# Patient Record
Sex: Male | Born: 1940 | Race: White | Hispanic: No | Smoking: Former smoker
Health system: Southern US, Community
[De-identification: ages and names within clinical notes are randomized; demographics above are authoritative.]

## PROBLEM LIST (undated history)

## (undated) DIAGNOSIS — M1612 Unilateral primary osteoarthritis, left hip: Secondary | ICD-10-CM

## (undated) DIAGNOSIS — F329 Major depressive disorder, single episode, unspecified: Secondary | ICD-10-CM

## (undated) DIAGNOSIS — G479 Sleep disorder, unspecified: Secondary | ICD-10-CM

## (undated) DIAGNOSIS — K219 Gastro-esophageal reflux disease without esophagitis: Secondary | ICD-10-CM

## (undated) DIAGNOSIS — K5790 Diverticulosis of intestine, part unspecified, without perforation or abscess without bleeding: Secondary | ICD-10-CM

## (undated) DIAGNOSIS — I2510 Atherosclerotic heart disease of native coronary artery without angina pectoris: Secondary | ICD-10-CM

## (undated) DIAGNOSIS — I10 Essential (primary) hypertension: Secondary | ICD-10-CM

## (undated) DIAGNOSIS — L989 Disorder of the skin and subcutaneous tissue, unspecified: Secondary | ICD-10-CM

## (undated) DIAGNOSIS — F32A Depression, unspecified: Secondary | ICD-10-CM

## (undated) DIAGNOSIS — M1990 Unspecified osteoarthritis, unspecified site: Secondary | ICD-10-CM

## (undated) DIAGNOSIS — E119 Type 2 diabetes mellitus without complications: Secondary | ICD-10-CM

## (undated) DIAGNOSIS — G4730 Sleep apnea, unspecified: Secondary | ICD-10-CM

## (undated) DIAGNOSIS — IMO0001 Reserved for inherently not codable concepts without codable children: Secondary | ICD-10-CM

## (undated) DIAGNOSIS — R3915 Urgency of urination: Secondary | ICD-10-CM

## (undated) DIAGNOSIS — I499 Cardiac arrhythmia, unspecified: Secondary | ICD-10-CM

## (undated) DIAGNOSIS — M1711 Unilateral primary osteoarthritis, right knee: Secondary | ICD-10-CM

## (undated) DIAGNOSIS — I82409 Acute embolism and thrombosis of unspecified deep veins of unspecified lower extremity: Secondary | ICD-10-CM

## (undated) HISTORY — PX: CORONARY ANGIOPLASTY: SHX604

## (undated) HISTORY — PX: TRANSURETHRAL RESECTION OF PROSTATE: SHX73

## (undated) HISTORY — PX: BACK SURGERY: SHX140

## (undated) HISTORY — DX: Reserved for inherently not codable concepts without codable children: IMO0001

## (undated) HISTORY — PX: JOINT REPLACEMENT: SHX530

## (undated) HISTORY — PX: PENILE PROSTHESIS IMPLANT: SHX240

## (undated) HISTORY — DX: Gastro-esophageal reflux disease without esophagitis: K21.9

## (undated) HISTORY — PX: FOOT SURGERY: SHX648

---

## 2002-04-24 HISTORY — PX: CORONARY ARTERY BYPASS GRAFT: SHX141

## 2010-01-19 ENCOUNTER — Other Ambulatory Visit: Payer: Self-pay

## 2010-06-11 ENCOUNTER — Inpatient Hospital Stay: Payer: Self-pay | Admitting: Specialist

## 2011-05-04 DIAGNOSIS — Z125 Encounter for screening for malignant neoplasm of prostate: Secondary | ICD-10-CM | POA: Diagnosis not present

## 2011-05-04 DIAGNOSIS — E119 Type 2 diabetes mellitus without complications: Secondary | ICD-10-CM | POA: Diagnosis not present

## 2011-05-04 DIAGNOSIS — E785 Hyperlipidemia, unspecified: Secondary | ICD-10-CM | POA: Diagnosis not present

## 2011-05-04 DIAGNOSIS — I10 Essential (primary) hypertension: Secondary | ICD-10-CM | POA: Diagnosis not present

## 2011-05-04 DIAGNOSIS — G4700 Insomnia, unspecified: Secondary | ICD-10-CM | POA: Diagnosis not present

## 2011-05-16 DIAGNOSIS — E785 Hyperlipidemia, unspecified: Secondary | ICD-10-CM | POA: Diagnosis not present

## 2011-05-16 DIAGNOSIS — I2510 Atherosclerotic heart disease of native coronary artery without angina pectoris: Secondary | ICD-10-CM | POA: Diagnosis not present

## 2011-05-16 DIAGNOSIS — I10 Essential (primary) hypertension: Secondary | ICD-10-CM | POA: Diagnosis not present

## 2011-05-16 DIAGNOSIS — E119 Type 2 diabetes mellitus without complications: Secondary | ICD-10-CM | POA: Diagnosis not present

## 2011-07-17 DIAGNOSIS — L821 Other seborrheic keratosis: Secondary | ICD-10-CM | POA: Diagnosis not present

## 2011-07-17 DIAGNOSIS — L259 Unspecified contact dermatitis, unspecified cause: Secondary | ICD-10-CM | POA: Diagnosis not present

## 2011-07-17 DIAGNOSIS — L570 Actinic keratosis: Secondary | ICD-10-CM | POA: Diagnosis not present

## 2011-07-20 DIAGNOSIS — L600 Ingrowing nail: Secondary | ICD-10-CM | POA: Diagnosis not present

## 2011-07-20 DIAGNOSIS — M898X9 Other specified disorders of bone, unspecified site: Secondary | ICD-10-CM | POA: Diagnosis not present

## 2011-07-31 DIAGNOSIS — L600 Ingrowing nail: Secondary | ICD-10-CM | POA: Diagnosis not present

## 2011-08-14 DIAGNOSIS — N401 Enlarged prostate with lower urinary tract symptoms: Secondary | ICD-10-CM | POA: Diagnosis not present

## 2011-08-14 DIAGNOSIS — R350 Frequency of micturition: Secondary | ICD-10-CM | POA: Diagnosis not present

## 2011-08-14 DIAGNOSIS — R351 Nocturia: Secondary | ICD-10-CM | POA: Diagnosis not present

## 2011-08-15 DIAGNOSIS — M898X9 Other specified disorders of bone, unspecified site: Secondary | ICD-10-CM | POA: Diagnosis not present

## 2011-09-19 DIAGNOSIS — B359 Dermatophytosis, unspecified: Secondary | ICD-10-CM | POA: Diagnosis not present

## 2011-09-19 DIAGNOSIS — L909 Atrophic disorder of skin, unspecified: Secondary | ICD-10-CM | POA: Diagnosis not present

## 2011-09-19 DIAGNOSIS — L919 Hypertrophic disorder of the skin, unspecified: Secondary | ICD-10-CM | POA: Diagnosis not present

## 2011-09-19 DIAGNOSIS — L821 Other seborrheic keratosis: Secondary | ICD-10-CM | POA: Diagnosis not present

## 2011-09-19 DIAGNOSIS — L259 Unspecified contact dermatitis, unspecified cause: Secondary | ICD-10-CM | POA: Diagnosis not present

## 2011-11-06 DIAGNOSIS — R350 Frequency of micturition: Secondary | ICD-10-CM | POA: Diagnosis not present

## 2011-11-06 DIAGNOSIS — R351 Nocturia: Secondary | ICD-10-CM | POA: Diagnosis not present

## 2011-11-06 DIAGNOSIS — R3915 Urgency of urination: Secondary | ICD-10-CM | POA: Diagnosis not present

## 2011-11-20 DIAGNOSIS — I119 Hypertensive heart disease without heart failure: Secondary | ICD-10-CM | POA: Diagnosis not present

## 2011-11-20 DIAGNOSIS — E119 Type 2 diabetes mellitus without complications: Secondary | ICD-10-CM | POA: Diagnosis not present

## 2011-11-20 DIAGNOSIS — E782 Mixed hyperlipidemia: Secondary | ICD-10-CM | POA: Diagnosis not present

## 2011-11-20 DIAGNOSIS — I2510 Atherosclerotic heart disease of native coronary artery without angina pectoris: Secondary | ICD-10-CM | POA: Diagnosis not present

## 2011-11-28 ENCOUNTER — Ambulatory Visit: Payer: Self-pay | Admitting: Urology

## 2011-11-28 DIAGNOSIS — R351 Nocturia: Secondary | ICD-10-CM | POA: Diagnosis not present

## 2011-11-28 DIAGNOSIS — N138 Other obstructive and reflux uropathy: Secondary | ICD-10-CM | POA: Diagnosis not present

## 2011-11-28 DIAGNOSIS — Z833 Family history of diabetes mellitus: Secondary | ICD-10-CM | POA: Diagnosis not present

## 2011-11-28 DIAGNOSIS — Z01812 Encounter for preprocedural laboratory examination: Secondary | ICD-10-CM | POA: Diagnosis not present

## 2011-11-28 DIAGNOSIS — Z7982 Long term (current) use of aspirin: Secondary | ICD-10-CM | POA: Diagnosis not present

## 2011-11-28 DIAGNOSIS — Z803 Family history of malignant neoplasm of breast: Secondary | ICD-10-CM | POA: Diagnosis not present

## 2011-11-28 DIAGNOSIS — Z01810 Encounter for preprocedural cardiovascular examination: Secondary | ICD-10-CM | POA: Diagnosis not present

## 2011-11-28 DIAGNOSIS — Z6831 Body mass index (BMI) 31.0-31.9, adult: Secondary | ICD-10-CM | POA: Diagnosis not present

## 2011-11-28 DIAGNOSIS — R350 Frequency of micturition: Secondary | ICD-10-CM | POA: Diagnosis not present

## 2011-11-28 DIAGNOSIS — N529 Male erectile dysfunction, unspecified: Secondary | ICD-10-CM | POA: Diagnosis not present

## 2011-11-28 DIAGNOSIS — I2510 Atherosclerotic heart disease of native coronary artery without angina pectoris: Secondary | ICD-10-CM | POA: Diagnosis not present

## 2011-11-28 DIAGNOSIS — Z79899 Other long term (current) drug therapy: Secondary | ICD-10-CM | POA: Diagnosis not present

## 2011-11-28 DIAGNOSIS — R339 Retention of urine, unspecified: Secondary | ICD-10-CM | POA: Diagnosis not present

## 2011-11-28 DIAGNOSIS — Z8249 Family history of ischemic heart disease and other diseases of the circulatory system: Secondary | ICD-10-CM | POA: Diagnosis not present

## 2011-11-28 LAB — CBC WITH DIFFERENTIAL/PLATELET
Eosinophil %: 2.6 %
Lymphocyte #: 2.6 10*3/uL (ref 1.0–3.6)
MCH: 32.7 pg (ref 26.0–34.0)
MCV: 94 fL (ref 80–100)
Monocyte #: 0.5 x10 3/mm (ref 0.2–1.0)
Platelet: 243 10*3/uL (ref 150–440)
RDW: 12.7 % (ref 11.5–14.5)

## 2011-11-28 LAB — BASIC METABOLIC PANEL
BUN: 27 mg/dL — ABNORMAL HIGH (ref 7–18)
Creatinine: 1.13 mg/dL (ref 0.60–1.30)
EGFR (African American): >60
Glucose: 132 mg/dL — ABNORMAL HIGH (ref 65–99)
Potassium: 4.1 mmol/L (ref 3.5–5.1)
Sodium: 139 mmol/L (ref 136–145)

## 2011-12-13 ENCOUNTER — Ambulatory Visit: Payer: Self-pay | Admitting: Urology

## 2011-12-13 DIAGNOSIS — Z803 Family history of malignant neoplasm of breast: Secondary | ICD-10-CM | POA: Diagnosis not present

## 2011-12-13 DIAGNOSIS — Z79899 Other long term (current) drug therapy: Secondary | ICD-10-CM | POA: Diagnosis not present

## 2011-12-13 DIAGNOSIS — E119 Type 2 diabetes mellitus without complications: Secondary | ICD-10-CM | POA: Diagnosis not present

## 2011-12-13 DIAGNOSIS — N318 Other neuromuscular dysfunction of bladder: Secondary | ICD-10-CM | POA: Diagnosis not present

## 2011-12-13 DIAGNOSIS — Z7982 Long term (current) use of aspirin: Secondary | ICD-10-CM | POA: Diagnosis not present

## 2011-12-13 DIAGNOSIS — Z87891 Personal history of nicotine dependence: Secondary | ICD-10-CM | POA: Diagnosis not present

## 2011-12-13 DIAGNOSIS — R350 Frequency of micturition: Secondary | ICD-10-CM | POA: Diagnosis not present

## 2011-12-13 DIAGNOSIS — R351 Nocturia: Secondary | ICD-10-CM | POA: Diagnosis not present

## 2011-12-13 DIAGNOSIS — Z833 Family history of diabetes mellitus: Secondary | ICD-10-CM | POA: Diagnosis not present

## 2011-12-13 DIAGNOSIS — Z951 Presence of aortocoronary bypass graft: Secondary | ICD-10-CM | POA: Diagnosis not present

## 2011-12-13 DIAGNOSIS — M4800 Spinal stenosis, site unspecified: Secondary | ICD-10-CM | POA: Diagnosis not present

## 2011-12-13 DIAGNOSIS — M069 Rheumatoid arthritis, unspecified: Secondary | ICD-10-CM | POA: Diagnosis not present

## 2011-12-13 DIAGNOSIS — N3941 Urge incontinence: Secondary | ICD-10-CM | POA: Diagnosis not present

## 2011-12-13 DIAGNOSIS — I2510 Atherosclerotic heart disease of native coronary artery without angina pectoris: Secondary | ICD-10-CM | POA: Diagnosis not present

## 2011-12-13 DIAGNOSIS — Z8249 Family history of ischemic heart disease and other diseases of the circulatory system: Secondary | ICD-10-CM | POA: Diagnosis not present

## 2011-12-13 DIAGNOSIS — R3915 Urgency of urination: Secondary | ICD-10-CM | POA: Diagnosis not present

## 2011-12-13 DIAGNOSIS — I10 Essential (primary) hypertension: Secondary | ICD-10-CM | POA: Diagnosis not present

## 2011-12-19 DIAGNOSIS — H43819 Vitreous degeneration, unspecified eye: Secondary | ICD-10-CM | POA: Diagnosis not present

## 2012-01-26 DIAGNOSIS — R350 Frequency of micturition: Secondary | ICD-10-CM | POA: Diagnosis not present

## 2012-01-26 DIAGNOSIS — R351 Nocturia: Secondary | ICD-10-CM | POA: Diagnosis not present

## 2012-01-26 DIAGNOSIS — R3915 Urgency of urination: Secondary | ICD-10-CM | POA: Diagnosis not present

## 2012-01-30 DIAGNOSIS — F411 Generalized anxiety disorder: Secondary | ICD-10-CM | POA: Diagnosis not present

## 2012-01-30 DIAGNOSIS — G4700 Insomnia, unspecified: Secondary | ICD-10-CM | POA: Diagnosis not present

## 2012-01-30 DIAGNOSIS — K649 Unspecified hemorrhoids: Secondary | ICD-10-CM | POA: Diagnosis not present

## 2012-02-02 DIAGNOSIS — E119 Type 2 diabetes mellitus without complications: Secondary | ICD-10-CM | POA: Diagnosis not present

## 2012-02-02 DIAGNOSIS — E785 Hyperlipidemia, unspecified: Secondary | ICD-10-CM | POA: Diagnosis not present

## 2012-02-02 DIAGNOSIS — I10 Essential (primary) hypertension: Secondary | ICD-10-CM | POA: Diagnosis not present

## 2012-02-02 DIAGNOSIS — K6289 Other specified diseases of anus and rectum: Secondary | ICD-10-CM | POA: Diagnosis not present

## 2012-02-14 DIAGNOSIS — Z23 Encounter for immunization: Secondary | ICD-10-CM | POA: Diagnosis not present

## 2012-03-01 DIAGNOSIS — K612 Anorectal abscess: Secondary | ICD-10-CM | POA: Diagnosis not present

## 2012-03-04 DIAGNOSIS — L980 Pyogenic granuloma: Secondary | ICD-10-CM | POA: Diagnosis not present

## 2012-03-04 DIAGNOSIS — D485 Neoplasm of uncertain behavior of skin: Secondary | ICD-10-CM | POA: Diagnosis not present

## 2012-03-04 DIAGNOSIS — L820 Inflamed seborrheic keratosis: Secondary | ICD-10-CM | POA: Diagnosis not present

## 2012-03-04 DIAGNOSIS — L821 Other seborrheic keratosis: Secondary | ICD-10-CM | POA: Diagnosis not present

## 2012-03-04 DIAGNOSIS — L723 Sebaceous cyst: Secondary | ICD-10-CM | POA: Diagnosis not present

## 2012-03-11 DIAGNOSIS — I10 Essential (primary) hypertension: Secondary | ICD-10-CM | POA: Diagnosis not present

## 2012-03-11 DIAGNOSIS — K625 Hemorrhage of anus and rectum: Secondary | ICD-10-CM | POA: Diagnosis not present

## 2012-03-11 DIAGNOSIS — F411 Generalized anxiety disorder: Secondary | ICD-10-CM | POA: Diagnosis not present

## 2012-03-11 DIAGNOSIS — G4700 Insomnia, unspecified: Secondary | ICD-10-CM | POA: Diagnosis not present

## 2012-03-13 DIAGNOSIS — M25569 Pain in unspecified knee: Secondary | ICD-10-CM | POA: Diagnosis not present

## 2012-04-03 DIAGNOSIS — I259 Chronic ischemic heart disease, unspecified: Secondary | ICD-10-CM | POA: Diagnosis not present

## 2012-04-03 DIAGNOSIS — I10 Essential (primary) hypertension: Secondary | ICD-10-CM | POA: Diagnosis not present

## 2012-04-03 DIAGNOSIS — F411 Generalized anxiety disorder: Secondary | ICD-10-CM | POA: Diagnosis not present

## 2012-04-03 DIAGNOSIS — G4700 Insomnia, unspecified: Secondary | ICD-10-CM | POA: Diagnosis not present

## 2012-04-10 DIAGNOSIS — M1710 Unilateral primary osteoarthritis, unspecified knee: Secondary | ICD-10-CM | POA: Diagnosis not present

## 2012-04-25 DIAGNOSIS — E119 Type 2 diabetes mellitus without complications: Secondary | ICD-10-CM | POA: Diagnosis not present

## 2012-04-25 DIAGNOSIS — J309 Allergic rhinitis, unspecified: Secondary | ICD-10-CM | POA: Diagnosis not present

## 2012-04-25 DIAGNOSIS — K219 Gastro-esophageal reflux disease without esophagitis: Secondary | ICD-10-CM | POA: Diagnosis not present

## 2012-04-25 DIAGNOSIS — I10 Essential (primary) hypertension: Secondary | ICD-10-CM | POA: Diagnosis not present

## 2012-04-25 DIAGNOSIS — E785 Hyperlipidemia, unspecified: Secondary | ICD-10-CM | POA: Diagnosis not present

## 2012-04-26 DIAGNOSIS — I059 Rheumatic mitral valve disease, unspecified: Secondary | ICD-10-CM | POA: Diagnosis not present

## 2012-04-26 DIAGNOSIS — I119 Hypertensive heart disease without heart failure: Secondary | ICD-10-CM | POA: Diagnosis not present

## 2012-04-26 DIAGNOSIS — I2510 Atherosclerotic heart disease of native coronary artery without angina pectoris: Secondary | ICD-10-CM | POA: Diagnosis not present

## 2012-04-26 DIAGNOSIS — E782 Mixed hyperlipidemia: Secondary | ICD-10-CM | POA: Diagnosis not present

## 2012-05-09 ENCOUNTER — Encounter (HOSPITAL_COMMUNITY): Payer: Self-pay | Admitting: Pharmacy Technician

## 2012-05-10 ENCOUNTER — Ambulatory Visit: Payer: Self-pay | Admitting: Gastroenterology

## 2012-05-10 DIAGNOSIS — I2510 Atherosclerotic heart disease of native coronary artery without angina pectoris: Secondary | ICD-10-CM | POA: Diagnosis not present

## 2012-05-10 DIAGNOSIS — K648 Other hemorrhoids: Secondary | ICD-10-CM | POA: Diagnosis not present

## 2012-05-10 DIAGNOSIS — F411 Generalized anxiety disorder: Secondary | ICD-10-CM | POA: Diagnosis not present

## 2012-05-10 DIAGNOSIS — K5730 Diverticulosis of large intestine without perforation or abscess without bleeding: Secondary | ICD-10-CM | POA: Diagnosis not present

## 2012-05-10 DIAGNOSIS — E669 Obesity, unspecified: Secondary | ICD-10-CM | POA: Diagnosis not present

## 2012-05-10 DIAGNOSIS — Z87891 Personal history of nicotine dependence: Secondary | ICD-10-CM | POA: Diagnosis not present

## 2012-05-10 DIAGNOSIS — Z860100 Personal history of colon polyps, unspecified: Secondary | ICD-10-CM | POA: Diagnosis not present

## 2012-05-10 DIAGNOSIS — K625 Hemorrhage of anus and rectum: Secondary | ICD-10-CM | POA: Diagnosis not present

## 2012-05-10 DIAGNOSIS — Z79899 Other long term (current) drug therapy: Secondary | ICD-10-CM | POA: Diagnosis not present

## 2012-05-10 DIAGNOSIS — K649 Unspecified hemorrhoids: Secondary | ICD-10-CM | POA: Diagnosis not present

## 2012-05-10 DIAGNOSIS — G4700 Insomnia, unspecified: Secondary | ICD-10-CM | POA: Diagnosis not present

## 2012-05-10 DIAGNOSIS — Z6835 Body mass index (BMI) 35.0-35.9, adult: Secondary | ICD-10-CM | POA: Diagnosis not present

## 2012-05-10 DIAGNOSIS — E785 Hyperlipidemia, unspecified: Secondary | ICD-10-CM | POA: Diagnosis not present

## 2012-05-10 DIAGNOSIS — Z951 Presence of aortocoronary bypass graft: Secondary | ICD-10-CM | POA: Diagnosis not present

## 2012-05-10 DIAGNOSIS — I10 Essential (primary) hypertension: Secondary | ICD-10-CM | POA: Diagnosis not present

## 2012-05-10 DIAGNOSIS — Z8052 Family history of malignant neoplasm of bladder: Secondary | ICD-10-CM | POA: Diagnosis not present

## 2012-05-10 DIAGNOSIS — K2100 Gastro-esophageal reflux disease with esophagitis, without bleeding: Secondary | ICD-10-CM | POA: Diagnosis not present

## 2012-05-10 DIAGNOSIS — E119 Type 2 diabetes mellitus without complications: Secondary | ICD-10-CM | POA: Diagnosis not present

## 2012-05-13 NOTE — Patient Instructions (Addendum)
UZAIR GODLEY  05/13/2012                           YOUR PROCEDURE IS SCHEDULED ON:  05/21/12               PLEASE REPORT TO SHORT STAY CENTER AT :  5:15 AM               CALL THIS NUMBER IF ANY PROBLEMS THE DAY OF SURGERY :               [phone]                      REMEMBER:   Do not eat food or drink liquids AFTER MIDNIGHT   Take these medicines the morning of surgery with A SIP OF WATER:  WELLBUTRIN Orbie Hurst / VYTORIN / PANTOPRAZOLE / MAY TAKE CLONAZAPAM IF NEEDED / MAY USE FLONASE NASAL SPRAY   Do not wear jewelry, make-up   Do not wear lotions, powders, or perfumes.   Do not shave legs or underarms 12 hrs. before surgery (men may shave face)  Do not bring valuables to the hospital.  Contacts, dentures or bridgework may not be worn into surgery.  Leave suitcase in the car. After surgery it may be brought to your room.  For patients admitted to the hospital more than one night, checkout time is 11:00                          The day of discharge.   Patients discharged the day of surgery will not be allowed to drive home                             If going home same day of surgery, must have someone stay with you first                           24 hrs at home and arrange for some one to drive you home from hospital.    Special Instructions:   Please read over the following fact sheets that you were given:               1. MRSA  INFORMATION                      2. [place] PREPARING FOR SURGERY SHEET                                                X_____________________________________________________________________        Failure to follow these instructions may result in cancellation of your surgery

## 2012-05-14 ENCOUNTER — Encounter (HOSPITAL_COMMUNITY)
Admission: RE | Admit: 2012-05-14 | Discharge: 2012-05-14 | Disposition: A | Discharge status: Home / Self Care | Payer: Medicare Other | Source: Ambulatory Visit | Attending: Orthopedic Surgery | Admitting: Orthopedic Surgery

## 2012-05-14 ENCOUNTER — Other Ambulatory Visit: Payer: Self-pay | Admitting: Orthopedic Surgery

## 2012-05-14 ENCOUNTER — Encounter (HOSPITAL_COMMUNITY): Payer: Self-pay

## 2012-05-14 ENCOUNTER — Ambulatory Visit (HOSPITAL_COMMUNITY)
Admission: RE | Admit: 2012-05-14 | Discharge: 2012-05-14 | Disposition: A | Discharge status: Home / Self Care | Payer: Medicare Other | Source: Ambulatory Visit | Attending: Orthopedic Surgery | Admitting: Orthopedic Surgery

## 2012-05-14 DIAGNOSIS — Z951 Presence of aortocoronary bypass graft: Secondary | ICD-10-CM | POA: Insufficient documentation

## 2012-05-14 DIAGNOSIS — Z01818 Encounter for other preprocedural examination: Secondary | ICD-10-CM | POA: Insufficient documentation

## 2012-05-14 DIAGNOSIS — Z01812 Encounter for preprocedural laboratory examination: Secondary | ICD-10-CM | POA: Insufficient documentation

## 2012-05-14 HISTORY — DX: Essential (primary) hypertension: I10

## 2012-05-14 HISTORY — DX: Sleep disorder, unspecified: G47.9

## 2012-05-14 HISTORY — DX: Unspecified osteoarthritis, unspecified site: M19.90

## 2012-05-14 HISTORY — DX: Major depressive disorder, single episode, unspecified: F32.9

## 2012-05-14 HISTORY — DX: Depression, unspecified: F32.A

## 2012-05-14 HISTORY — DX: Atherosclerotic heart disease of native coronary artery without angina pectoris: I25.10

## 2012-05-14 HISTORY — DX: Urgency of urination: R39.15

## 2012-05-14 HISTORY — DX: Gastro-esophageal reflux disease without esophagitis: K21.9

## 2012-05-14 HISTORY — DX: Type 2 diabetes mellitus without complications: E11.9

## 2012-05-14 LAB — BASIC METABOLIC PANEL
CO2: 25 mEq/L (ref 19–32)
Calcium: 9.3 mg/dL (ref 8.4–10.5)
Chloride: 102 mEq/L (ref 96–112)
Potassium: 4.2 mEq/L (ref 3.5–5.1)
Sodium: 138 mEq/L (ref 135–145)

## 2012-05-14 LAB — URINALYSIS, ROUTINE W REFLEX MICROSCOPIC
Hgb urine dipstick: NEGATIVE
Protein, ur: NEGATIVE mg/dL
Urobilinogen, UA: 1 mg/dL (ref 0.0–1.0)

## 2012-05-14 LAB — CBC
HCT: 45.3 % (ref 39.0–52.0)
Hemoglobin: 15.8 g/dL (ref 13.0–17.0)
MCV: 92.3 fL (ref 78.0–100.0)
Platelets: 292 10*3/uL (ref 150–400)
RBC: 4.91 MIL/uL (ref 4.22–5.81)
WBC: 7.8 10*3/uL (ref 4.0–10.5)

## 2012-05-14 LAB — ABO/RH: ABO/RH(D): O POS

## 2012-05-14 LAB — SURGICAL PCR SCREEN: Staphylococcus aureus: POSITIVE — AB

## 2012-05-14 NOTE — Progress Notes (Signed)
05/14/12 1041  OBSTRUCTIVE SLEEP APNEA  Have you ever been diagnosed with sleep apnea through a sleep study? No  Do you snore loudly (loud enough to be heard through closed doors)?  1  Do you often feel tired, fatigued, or sleepy during the daytime? 0  Has anyone observed you stop breathing during your sleep? 0  Do you have, or are you being treated for high blood pressure? 1  BMI more than 35 kg/m2? 0  Age over 71 years old? 1  Neck circumference greater than 40 cm/18 inches? 1  Gender: 1  Obstructive Sleep Apnea Score 5   Score 4 or greater  Results sent to PCP

## 2012-05-21 ENCOUNTER — Inpatient Hospital Stay (HOSPITAL_COMMUNITY): Payer: Medicare Other | Admitting: Anesthesiology

## 2012-05-21 ENCOUNTER — Encounter (HOSPITAL_COMMUNITY): Payer: Self-pay | Admitting: *Deleted

## 2012-05-21 ENCOUNTER — Encounter (HOSPITAL_COMMUNITY): Payer: Self-pay | Admitting: Orthopedic Surgery

## 2012-05-21 ENCOUNTER — Inpatient Hospital Stay (HOSPITAL_COMMUNITY)
Admission: RE | Admit: 2012-05-21 | Discharge: 2012-05-24 | DRG: 470 | Disposition: A | Discharge status: Skilled Nursing Facility | Payer: Medicare Other | Source: Ambulatory Visit | Attending: Orthopedic Surgery | Admitting: Orthopedic Surgery

## 2012-05-21 ENCOUNTER — Encounter (HOSPITAL_COMMUNITY)
Admission: RE | Disposition: A | Discharge status: Skilled Nursing Facility | Payer: Self-pay | Source: Ambulatory Visit | Attending: Orthopedic Surgery

## 2012-05-21 ENCOUNTER — Inpatient Hospital Stay (HOSPITAL_COMMUNITY): Payer: Medicare Other

## 2012-05-21 ENCOUNTER — Encounter (HOSPITAL_COMMUNITY): Payer: Self-pay | Admitting: Anesthesiology

## 2012-05-21 DIAGNOSIS — M6281 Muscle weakness (generalized): Secondary | ICD-10-CM | POA: Diagnosis not present

## 2012-05-21 DIAGNOSIS — K219 Gastro-esophageal reflux disease without esophagitis: Secondary | ICD-10-CM | POA: Diagnosis present

## 2012-05-21 DIAGNOSIS — R262 Difficulty in walking, not elsewhere classified: Secondary | ICD-10-CM | POA: Diagnosis not present

## 2012-05-21 DIAGNOSIS — M1710 Unilateral primary osteoarthritis, unspecified knee: Secondary | ICD-10-CM | POA: Diagnosis not present

## 2012-05-21 DIAGNOSIS — E119 Type 2 diabetes mellitus without complications: Secondary | ICD-10-CM | POA: Diagnosis present

## 2012-05-21 DIAGNOSIS — F411 Generalized anxiety disorder: Secondary | ICD-10-CM | POA: Diagnosis not present

## 2012-05-21 DIAGNOSIS — F3289 Other specified depressive episodes: Secondary | ICD-10-CM | POA: Diagnosis present

## 2012-05-21 DIAGNOSIS — IMO0002 Reserved for concepts with insufficient information to code with codable children: Secondary | ICD-10-CM | POA: Diagnosis not present

## 2012-05-21 DIAGNOSIS — G8918 Other acute postprocedural pain: Secondary | ICD-10-CM | POA: Diagnosis not present

## 2012-05-21 DIAGNOSIS — Z471 Aftercare following joint replacement surgery: Secondary | ICD-10-CM | POA: Diagnosis not present

## 2012-05-21 DIAGNOSIS — F329 Major depressive disorder, single episode, unspecified: Secondary | ICD-10-CM | POA: Diagnosis present

## 2012-05-21 DIAGNOSIS — E785 Hyperlipidemia, unspecified: Secondary | ICD-10-CM | POA: Diagnosis not present

## 2012-05-21 DIAGNOSIS — Z96659 Presence of unspecified artificial knee joint: Secondary | ICD-10-CM | POA: Diagnosis not present

## 2012-05-21 DIAGNOSIS — E669 Obesity, unspecified: Secondary | ICD-10-CM | POA: Diagnosis present

## 2012-05-21 DIAGNOSIS — M25569 Pain in unspecified knee: Secondary | ICD-10-CM | POA: Diagnosis not present

## 2012-05-21 DIAGNOSIS — I2510 Atherosclerotic heart disease of native coronary artery without angina pectoris: Secondary | ICD-10-CM | POA: Diagnosis not present

## 2012-05-21 DIAGNOSIS — M1711 Unilateral primary osteoarthritis, right knee: Secondary | ICD-10-CM

## 2012-05-21 DIAGNOSIS — Z951 Presence of aortocoronary bypass graft: Secondary | ICD-10-CM | POA: Diagnosis not present

## 2012-05-21 DIAGNOSIS — E559 Vitamin D deficiency, unspecified: Secondary | ICD-10-CM | POA: Diagnosis not present

## 2012-05-21 DIAGNOSIS — I10 Essential (primary) hypertension: Secondary | ICD-10-CM | POA: Diagnosis not present

## 2012-05-21 DIAGNOSIS — S8990XA Unspecified injury of unspecified lower leg, initial encounter: Secondary | ICD-10-CM | POA: Diagnosis not present

## 2012-05-21 DIAGNOSIS — M1990 Unspecified osteoarthritis, unspecified site: Secondary | ICD-10-CM | POA: Diagnosis not present

## 2012-05-21 HISTORY — DX: Unilateral primary osteoarthritis, right knee: M17.11

## 2012-05-21 HISTORY — PX: TOTAL KNEE ARTHROPLASTY: SHX125

## 2012-05-21 LAB — CBC
Platelets: 259 10*3/uL (ref 150–400)
RBC: 4.47 MIL/uL (ref 4.22–5.81)
WBC: 10.3 10*3/uL (ref 4.0–10.5)

## 2012-05-21 LAB — GLUCOSE, CAPILLARY

## 2012-05-21 LAB — TYPE AND SCREEN
ABO/RH(D): O POS
Antibody Screen: NEGATIVE

## 2012-05-21 LAB — CREATININE, SERUM
Creatinine, Ser: 0.96 mg/dL (ref 0.50–1.35)
GFR calc Af Amer: >90 mL/min (ref >90)
GFR calc non Af Amer: 82 mL/min — ABNORMAL LOW (ref >90)

## 2012-05-21 SURGERY — ARTHROPLASTY, KNEE, TOTAL
Anesthesia: General | Site: Knee | Laterality: Right | Wound class: Clean

## 2012-05-21 MED ORDER — IRBESARTAN 150 MG PO TABS
150.0000 mg | ORAL_TABLET | Freq: Every day | ORAL | Status: DC
  Administered 2012-05-22 – 2012-05-24 (×3): 150 mg via ORAL
  Filled 2012-05-21 (×4): qty 1

## 2012-05-21 MED ORDER — POLYETHYLENE GLYCOL 3350 17 G PO PACK
17.0000 g | PACK | Freq: Every day | ORAL | Status: DC | PRN
  Administered 2012-05-22: 17 g via ORAL

## 2012-05-21 MED ORDER — SENNA 8.6 MG PO TABS
1.0000 | ORAL_TABLET | Freq: Two times a day (BID) | ORAL | Status: DC
  Administered 2012-05-21 – 2012-05-23 (×4): 8.6 mg via ORAL
  Filled 2012-05-21 (×5): qty 1

## 2012-05-21 MED ORDER — MEPERIDINE HCL 50 MG/ML IJ SOLN: 6.2500 mg | INTRAMUSCULAR | Status: DC | PRN

## 2012-05-21 MED ORDER — HYDROMORPHONE HCL PF 1 MG/ML IJ SOLN
INTRAMUSCULAR | Status: AC
  Filled 2012-05-21: qty 1

## 2012-05-21 MED ORDER — ACETAMINOPHEN 10 MG/ML IV SOLN
1000.0000 mg | Freq: Four times a day (QID) | INTRAVENOUS | Status: AC
  Administered 2012-05-21 – 2012-05-22 (×4): 1000 mg via INTRAVENOUS
  Filled 2012-05-21 (×5): qty 100

## 2012-05-21 MED ORDER — HYDROMORPHONE HCL PF 1 MG/ML IJ SOLN
0.5000 mg | INTRAMUSCULAR | Status: DC | PRN
  Administered 2012-05-21 – 2012-05-22 (×3): 0.5 mg via INTRAVENOUS
  Filled 2012-05-21 (×2): qty 1

## 2012-05-21 MED ORDER — LACTATED RINGERS IV SOLN
INTRAVENOUS | Status: DC | PRN
  Administered 2012-05-21 (×2): via INTRAVENOUS

## 2012-05-21 MED ORDER — LACTATED RINGERS IV SOLN
INTRAVENOUS | Status: DC
  Administered 2012-05-21: 1000 mL via INTRAVENOUS

## 2012-05-21 MED ORDER — ONDANSETRON HCL 4 MG PO TABS: 4.0000 mg | ORAL_TABLET | Freq: Four times a day (QID) | ORAL | Status: DC | PRN

## 2012-05-21 MED ORDER — ALUM & MAG HYDROXIDE-SIMETH 200-200-20 MG/5ML PO SUSP
30.0000 mL | ORAL | Status: DC | PRN
  Administered 2012-05-23: 30 mL via ORAL
  Filled 2012-05-21: qty 30

## 2012-05-21 MED ORDER — ZOLPIDEM TARTRATE 5 MG PO TABS: 5.0000 mg | ORAL_TABLET | Freq: Every evening | ORAL | Status: DC | PRN

## 2012-05-21 MED ORDER — MENTHOL 3 MG MT LOZG: 1.0000 | LOZENGE | OROMUCOSAL | Status: DC | PRN

## 2012-05-21 MED ORDER — DEXTROSE 5 % IV SOLN
3.0000 g | Freq: Four times a day (QID) | INTRAVENOUS | Status: AC
  Administered 2012-05-21 (×2): 3 g via INTRAVENOUS
  Filled 2012-05-21 (×3): qty 3000

## 2012-05-21 MED ORDER — EPHEDRINE SULFATE 50 MG/ML IJ SOLN
INTRAMUSCULAR | Status: DC | PRN
  Administered 2012-05-21 (×2): 5 mg via INTRAVENOUS

## 2012-05-21 MED ORDER — ASPIRIN EC 81 MG PO TBEC
81.0000 mg | DELAYED_RELEASE_TABLET | Freq: Every day | ORAL | Status: DC
  Administered 2012-05-21 – 2012-05-24 (×4): 81 mg via ORAL
  Filled 2012-05-21 (×4): qty 1

## 2012-05-21 MED ORDER — OXYCODONE HCL 5 MG PO TABS: 5.0000 mg | ORAL_TABLET | Freq: Once | ORAL | Status: DC | PRN

## 2012-05-21 MED ORDER — VITAMIN D3 25 MCG (1000 UNIT) PO TABS
2000.0000 [IU] | ORAL_TABLET | Freq: Every day | ORAL | Status: DC
  Administered 2012-05-21 – 2012-05-24 (×4): 2000 [IU] via ORAL
  Filled 2012-05-21 (×4): qty 2

## 2012-05-21 MED ORDER — CO Q 10 100 MG PO CAPS: 1.0000 | ORAL_CAPSULE | Freq: Every day | ORAL | Status: DC

## 2012-05-21 MED ORDER — POTASSIUM CHLORIDE IN NACL 20-0.45 MEQ/L-% IV SOLN
INTRAVENOUS | Status: DC
  Administered 2012-05-21 – 2012-05-23 (×4): via INTRAVENOUS
  Filled 2012-05-21 (×7): qty 1000

## 2012-05-21 MED ORDER — ROPIVACAINE HCL 5 MG/ML IJ SOLN
INTRAMUSCULAR | Status: DC | PRN
  Administered 2012-05-21: 30 mL

## 2012-05-21 MED ORDER — DEXTROSE 5 % IV SOLN
3.0000 g | INTRAVENOUS | Status: AC
  Administered 2012-05-21: 3 g via INTRAVENOUS

## 2012-05-21 MED ORDER — EZETIMIBE-SIMVASTATIN 10-20 MG PO TABS
1.0000 | ORAL_TABLET | Freq: Every day | ORAL | Status: DC
  Administered 2012-05-22 – 2012-05-24 (×3): 1 via ORAL
  Filled 2012-05-21 (×4): qty 1

## 2012-05-21 MED ORDER — ONDANSETRON HCL 4 MG/2ML IJ SOLN
INTRAMUSCULAR | Status: DC | PRN
  Administered 2012-05-21: 4 mg via INTRAVENOUS

## 2012-05-21 MED ORDER — CLONAZEPAM 0.5 MG PO TABS
0.5000 mg | ORAL_TABLET | Freq: Two times a day (BID) | ORAL | Status: DC | PRN
  Administered 2012-05-21 – 2012-05-23 (×2): 0.5 mg via ORAL
  Filled 2012-05-21 (×2): qty 1

## 2012-05-21 MED ORDER — PANTOPRAZOLE SODIUM 40 MG PO TBEC
40.0000 mg | DELAYED_RELEASE_TABLET | Freq: Every day | ORAL | Status: DC
  Administered 2012-05-22 – 2012-05-24 (×3): 40 mg via ORAL
  Filled 2012-05-21 (×3): qty 1

## 2012-05-21 MED ORDER — ONDANSETRON HCL 4 MG/2ML IJ SOLN: 4.0000 mg | Freq: Four times a day (QID) | INTRAMUSCULAR | Status: DC | PRN

## 2012-05-21 MED ORDER — BUSPIRONE HCL 15 MG PO TABS
15.0000 mg | ORAL_TABLET | Freq: Every day | ORAL | Status: DC
  Administered 2012-05-22 – 2012-05-24 (×3): 15 mg via ORAL
  Filled 2012-05-21 (×4): qty 1

## 2012-05-21 MED ORDER — METHOCARBAMOL 100 MG/ML IJ SOLN
500.0000 mg | Freq: Four times a day (QID) | INTRAVENOUS | Status: DC | PRN
  Administered 2012-05-21: 500 mg via INTRAVENOUS
  Filled 2012-05-21: qty 5

## 2012-05-21 MED ORDER — OXYCODONE HCL 5 MG PO TABS
5.0000 mg | ORAL_TABLET | ORAL | Status: DC | PRN
  Administered 2012-05-21 – 2012-05-23 (×15): 10 mg via ORAL
  Filled 2012-05-21 (×15): qty 2

## 2012-05-21 MED ORDER — WARFARIN SODIUM 5 MG PO TABS: 5.0000 mg | ORAL_TABLET | Freq: Every day | ORAL | Status: DC

## 2012-05-21 MED ORDER — METOCLOPRAMIDE HCL 10 MG PO TABS: 5.0000 mg | ORAL_TABLET | Freq: Three times a day (TID) | ORAL | Status: DC | PRN

## 2012-05-21 MED ORDER — PROMETHAZINE HCL 25 MG/ML IJ SOLN: 6.2500 mg | INTRAMUSCULAR | Status: DC | PRN

## 2012-05-21 MED ORDER — DOCUSATE SODIUM 100 MG PO CAPS
100.0000 mg | ORAL_CAPSULE | Freq: Two times a day (BID) | ORAL | Status: DC
  Administered 2012-05-21 – 2012-05-23 (×4): 100 mg via ORAL

## 2012-05-21 MED ORDER — BUPROPION HCL ER (XL) 300 MG PO TB24
300.0000 mg | ORAL_TABLET | Freq: Every day | ORAL | Status: DC
  Administered 2012-05-22 – 2012-05-24 (×3): 300 mg via ORAL
  Filled 2012-05-21 (×4): qty 1

## 2012-05-21 MED ORDER — MAGNESIUM CITRATE PO SOLN: 1.0000 | Freq: Once | ORAL | Status: AC | PRN

## 2012-05-21 MED ORDER — SORBITOL 70 % SOLN
30.0000 mL | Freq: Every day | Status: DC | PRN
  Administered 2012-05-22: 30 mL via ORAL
  Filled 2012-05-21: qty 30

## 2012-05-21 MED ORDER — MIDAZOLAM HCL 5 MG/5ML IJ SOLN
INTRAMUSCULAR | Status: DC | PRN
  Administered 2012-05-21: 2 mg via INTRAVENOUS

## 2012-05-21 MED ORDER — DIPHENHYDRAMINE HCL 12.5 MG/5ML PO ELIX: 12.5000 mg | ORAL_SOLUTION | ORAL | Status: DC | PRN

## 2012-05-21 MED ORDER — WARFARIN SODIUM 7.5 MG PO TABS
7.5000 mg | ORAL_TABLET | Freq: Once | ORAL | Status: AC
  Administered 2012-05-21: 7.5 mg via ORAL
  Filled 2012-05-21: qty 1

## 2012-05-21 MED ORDER — PHENOL 1.4 % MT LIQD: 1.0000 | OROMUCOSAL | Status: DC | PRN

## 2012-05-21 MED ORDER — ADULT MULTIVITAMIN W/MINERALS CH
1.0000 | ORAL_TABLET | Freq: Every day | ORAL | Status: DC
  Administered 2012-05-21 – 2012-05-24 (×4): 1 via ORAL
  Filled 2012-05-21 (×6): qty 1

## 2012-05-21 MED ORDER — ACETAMINOPHEN 650 MG RE SUPP: 650.0000 mg | Freq: Four times a day (QID) | RECTAL | Status: DC | PRN

## 2012-05-21 MED ORDER — LIDOCAINE HCL (CARDIAC) 20 MG/ML IV SOLN
INTRAVENOUS | Status: DC | PRN
  Administered 2012-05-21: 60 mg via INTRAVENOUS

## 2012-05-21 MED ORDER — FLUTICASONE PROPIONATE 50 MCG/ACT NA SUSP
2.0000 | Freq: Every day | NASAL | Status: DC
  Administered 2012-05-22 – 2012-05-24 (×3): 2 via NASAL
  Filled 2012-05-21: qty 16

## 2012-05-21 MED ORDER — OXYCODONE-ACETAMINOPHEN 10-325 MG PO TABS: 1.0000 | ORAL_TABLET | Freq: Four times a day (QID) | ORAL | Status: DC | PRN

## 2012-05-21 MED ORDER — OXYCODONE HCL 5 MG/5ML PO SOLN
5.0000 mg | Freq: Once | ORAL | Status: DC | PRN
  Filled 2012-05-21: qty 5

## 2012-05-21 MED ORDER — KETOROLAC TROMETHAMINE 15 MG/ML IJ SOLN
7.5000 mg | Freq: Four times a day (QID) | INTRAMUSCULAR | Status: AC
  Administered 2012-05-21 – 2012-05-22 (×4): 7.5 mg via INTRAVENOUS
  Filled 2012-05-21 (×4): qty 1

## 2012-05-21 MED ORDER — FENTANYL CITRATE 0.05 MG/ML IJ SOLN
INTRAMUSCULAR | Status: DC | PRN
  Administered 2012-05-21 (×7): 50 ug via INTRAVENOUS

## 2012-05-21 MED ORDER — PATIENT'S GUIDE TO USING COUMADIN BOOK
Freq: Once | Status: AC
  Administered 2012-05-21: 17:00:00
  Filled 2012-05-21: qty 1

## 2012-05-21 MED ORDER — ACETAMINOPHEN 10 MG/ML IV SOLN
INTRAVENOUS | Status: DC | PRN
  Administered 2012-05-21: 1000 mg via INTRAVENOUS

## 2012-05-21 MED ORDER — PROPOFOL 10 MG/ML IV BOLUS
INTRAVENOUS | Status: DC | PRN
  Administered 2012-05-21: 200 mg via INTRAVENOUS

## 2012-05-21 MED ORDER — WARFARIN - PHARMACIST DOSING INPATIENT: Freq: Every day | Status: DC

## 2012-05-21 MED ORDER — METHOCARBAMOL 500 MG PO TABS
500.0000 mg | ORAL_TABLET | Freq: Four times a day (QID) | ORAL | Status: DC | PRN
  Administered 2012-05-21 – 2012-05-24 (×6): 500 mg via ORAL
  Filled 2012-05-21 (×6): qty 1

## 2012-05-21 MED ORDER — WARFARIN VIDEO: Freq: Once | Status: DC

## 2012-05-21 MED ORDER — ACETAMINOPHEN 325 MG PO TABS: 650.0000 mg | ORAL_TABLET | Freq: Four times a day (QID) | ORAL | Status: DC | PRN

## 2012-05-21 MED ORDER — METOCLOPRAMIDE HCL 5 MG/ML IJ SOLN: 5.0000 mg | Freq: Three times a day (TID) | INTRAMUSCULAR | Status: DC | PRN

## 2012-05-21 MED ORDER — HYDROMORPHONE HCL PF 1 MG/ML IJ SOLN
INTRAMUSCULAR | Status: DC | PRN
  Administered 2012-05-21 (×4): 0.5 mg via INTRAVENOUS

## 2012-05-21 MED ORDER — ENOXAPARIN SODIUM 30 MG/0.3ML ~~LOC~~ SOLN
30.0000 mg | Freq: Two times a day (BID) | SUBCUTANEOUS | Status: DC
  Administered 2012-05-22 – 2012-05-24 (×5): 30 mg via SUBCUTANEOUS
  Filled 2012-05-21 (×7): qty 0.3

## 2012-05-21 MED ORDER — INSULIN ASPART 100 UNIT/ML ~~LOC~~ SOLN
0.0000 [IU] | Freq: Three times a day (TID) | SUBCUTANEOUS | Status: DC
  Administered 2012-05-21 – 2012-05-22 (×2): 2 [IU] via SUBCUTANEOUS
  Administered 2012-05-22: 3 [IU] via SUBCUTANEOUS
  Administered 2012-05-22 – 2012-05-23 (×2): 2 [IU] via SUBCUTANEOUS
  Administered 2012-05-23: 3 [IU] via SUBCUTANEOUS
  Administered 2012-05-23 – 2012-05-24 (×2): 2 [IU] via SUBCUTANEOUS

## 2012-05-21 MED ORDER — HYDROMORPHONE HCL PF 1 MG/ML IJ SOLN
0.2500 mg | INTRAMUSCULAR | Status: DC | PRN
  Administered 2012-05-21 (×4): 0.5 mg via INTRAVENOUS

## 2012-05-21 MED ORDER — SENNA-DOCUSATE SODIUM 8.6-50 MG PO TABS: 1.0000 | ORAL_TABLET | Freq: Every day | ORAL | Status: DC

## 2012-05-21 MED ORDER — SODIUM CHLORIDE 0.9 % IR SOLN
Status: DC | PRN
  Administered 2012-05-21: 1000 mL
  Administered 2012-05-21: 3000 mL

## 2012-05-21 MED ORDER — ACETAMINOPHEN 10 MG/ML IV SOLN: 1000.0000 mg | Freq: Once | INTRAVENOUS | Status: DC | PRN

## 2012-05-21 SURGICAL SUPPLY — 65 items
BAG ZIPLOCK 12X15 (MISCELLANEOUS) ×2 IMPLANT
BANDAGE ESMARK 6X9 LF (GAUZE/BANDAGES/DRESSINGS) ×1 IMPLANT
BENZOIN TINCTURE PRP APPL 2/3 (GAUZE/BANDAGES/DRESSINGS) ×2 IMPLANT
BLADE SAG 18X100X1.27 (BLADE) ×2 IMPLANT
BLADE SAW RECIPROCATING 77.5 (BLADE) ×2 IMPLANT
BLADE SAW SGTL 13.0X1.19X90.0M (BLADE) ×2 IMPLANT
BNDG ELASTIC 6X15 VLCR STRL LF (GAUZE/BANDAGES/DRESSINGS) ×2 IMPLANT
BNDG ESMARK 6X9 LF (GAUZE/BANDAGES/DRESSINGS) ×2
BOWL SMART MIX CTS (DISPOSABLE) ×2 IMPLANT
CEMENT HV SMART SET (Cement) ×4 IMPLANT
CLEANER TIP ELECTROSURG 2X2 (MISCELLANEOUS) IMPLANT
CLOTH BEACON ORANGE TIMEOUT ST (SAFETY) ×2 IMPLANT
CLSR STERI-STRIP ANTIMIC 1/2X4 (GAUZE/BANDAGES/DRESSINGS) ×2 IMPLANT
CUFF TOURN SGL QUICK 34 (TOURNIQUET CUFF) ×1
CUFF TRNQT CYL 34X4X40X1 (TOURNIQUET CUFF) ×1 IMPLANT
DRAPE EXTREMITY T 121X128X90 (DRAPE) ×2 IMPLANT
DRAPE POUCH INSTRU U-SHP 10X18 (DRAPES) ×2 IMPLANT
DRAPE U-SHAPE 47X51 STRL (DRAPES) ×2 IMPLANT
DRSG ADAPTIC 3X8 NADH LF (GAUZE/BANDAGES/DRESSINGS) IMPLANT
DRSG PAD ABDOMINAL 8X10 ST (GAUZE/BANDAGES/DRESSINGS) ×2 IMPLANT
ELECT REM PT RETURN 9FT ADLT (ELECTROSURGICAL) ×2
ELECTRODE REM PT RTRN 9FT ADLT (ELECTROSURGICAL) ×1 IMPLANT
EVACUATOR 1/8 PVC DRAIN (DRAIN) IMPLANT
FACESHIELD LNG OPTICON STERILE (SAFETY) ×6 IMPLANT
GLOVE BIOGEL PI IND STRL 6.5 (GLOVE) ×2 IMPLANT
GLOVE BIOGEL PI IND STRL 7.0 (GLOVE) ×1 IMPLANT
GLOVE BIOGEL PI IND STRL 7.5 (GLOVE) ×1 IMPLANT
GLOVE BIOGEL PI IND STRL 8 (GLOVE) ×2 IMPLANT
GLOVE BIOGEL PI INDICATOR 6.5 (GLOVE) ×2
GLOVE BIOGEL PI INDICATOR 7.0 (GLOVE) ×1
GLOVE BIOGEL PI INDICATOR 7.5 (GLOVE) ×1
GLOVE BIOGEL PI INDICATOR 8 (GLOVE) ×2
GLOVE ORTHO TXT STRL SZ7.5 (GLOVE) ×2 IMPLANT
GLOVE SURG ORTHO 8.0 STRL STRW (GLOVE) ×6 IMPLANT
GLOVE SURG SS PI 6.5 STRL IVOR (GLOVE) ×6 IMPLANT
GOWN STRL NON-REIN LRG LVL3 (GOWN DISPOSABLE) ×4 IMPLANT
GOWN STRL REIN XL XLG (GOWN DISPOSABLE) ×4 IMPLANT
HANDPIECE INTERPULSE COAX TIP (DISPOSABLE) ×1
HOOD PEEL AWAY FACE SHEILD DIS (HOOD) ×4 IMPLANT
IMMOBILIZER KNEE 16 UNIV (MISCELLANEOUS) IMPLANT
IMMOBILIZER KNEE 20 (SOFTGOODS) ×2
IMMOBILIZER KNEE 20 THIGH 36 (SOFTGOODS) ×1 IMPLANT
KIT BASIN OR (CUSTOM PROCEDURE TRAY) ×2 IMPLANT
MANIFOLD NEPTUNE II (INSTRUMENTS) ×2 IMPLANT
NS IRRIG 1000ML POUR BTL (IV SOLUTION) ×2 IMPLANT
PACK ICE MAXI GEL EZY WRAP (MISCELLANEOUS) ×2 IMPLANT
PACK TOTAL JOINT (CUSTOM PROCEDURE TRAY) ×2 IMPLANT
PAD CAST 4YDX4 CTTN HI CHSV (CAST SUPPLIES) ×1 IMPLANT
PADDING CAST COTTON 4X4 STRL (CAST SUPPLIES) ×1
PADDING CAST COTTON 6X4 STRL (CAST SUPPLIES) ×2 IMPLANT
POSITIONER SURGICAL ARM (MISCELLANEOUS) ×2 IMPLANT
SET HNDPC FAN SPRY TIP SCT (DISPOSABLE) ×1 IMPLANT
SPONGE GAUZE 4X4 12PLY (GAUZE/BANDAGES/DRESSINGS) ×2 IMPLANT
SPONGE LAP 18X18 X RAY DECT (DISPOSABLE) IMPLANT
STAPLER VISISTAT 35W (STAPLE) IMPLANT
SUCTION FRAZIER 12FR DISP (SUCTIONS) ×2 IMPLANT
SUT MNCRL AB 4-0 PS2 18 (SUTURE) ×2 IMPLANT
SUT VIC AB 0 CT1 36 (SUTURE) ×4 IMPLANT
SUT VIC AB 2-0 CT1 27 (SUTURE) ×3
SUT VIC AB 2-0 CT1 TAPERPNT 27 (SUTURE) ×3 IMPLANT
SUT VIC AB 3-0 SH 18 (SUTURE) ×2 IMPLANT
SUT VIC AB 4-0 PS2 18 (SUTURE) IMPLANT
TOWEL OR 17X26 10 PK STRL BLUE (TOWEL DISPOSABLE) ×4 IMPLANT
TRAY FOLEY CATH 14FRSI W/METER (CATHETERS) ×2 IMPLANT
WATER STERILE IRR 1500ML POUR (IV SOLUTION) ×4 IMPLANT

## 2012-05-21 NOTE — Plan of Care (Signed)
Problem: Consults Goal: Diagnosis- Total Joint Replacement Outcome: Progressing Primary Total Knee     

## 2012-05-21 NOTE — Anesthesia Preprocedure Evaluation (Addendum)
Anesthesia Evaluation  Patient identified by MRN, date of birth, ID band Patient awake    Reviewed: Allergy & Precautions, H&P , NPO status , Patient's Chart, lab work & pertinent test results  Airway Mallampati: II TM Distance: >3 FB Neck ROM: Full    Dental  (+) Dental Advisory Given and Missing   Pulmonary former smoker,  breath sounds clear to auscultation  Pulmonary exam normal       Cardiovascular Exercise Tolerance: Good hypertension, Pt. on medications + CAD and + CABG Rhythm:Regular Rate:Normal     Neuro/Psych PSYCHIATRIC DISORDERS Depression negative neurological ROS     GI/Hepatic negative GI ROS, Neg liver ROS, GERD-  Medicated,  Endo/Other  diabetes, Type 2, Oral Hypoglycemic Agents  Renal/GU negative Renal ROS     Musculoskeletal negative musculoskeletal ROS (+)   Abdominal (+) + obese,   Peds  Hematology negative hematology ROS (+)   Anesthesia Other Findings   Reproductive/Obstetrics                          Anesthesia Physical Anesthesia Plan  ASA: III  Anesthesia Plan: General and Regional   Post-op Pain Management:    Induction: Intravenous  Airway Management Planned: Oral ETT  Additional Equipment:   Intra-op Plan:   Post-operative Plan: Extubation in OR  Informed Consent: I have reviewed the patients History and Physical, chart, labs and discussed the procedure including the risks, benefits and alternatives for the proposed anesthesia with the patient or authorized representative who has indicated his/her understanding and acceptance.   Dental advisory given  Plan Discussed with: CRNA  Anesthesia Plan Comments:        Anesthesia Quick Evaluation

## 2012-05-21 NOTE — Progress Notes (Signed)

## 2012-05-21 NOTE — Anesthesia Procedure Notes (Signed)
Anesthesia Regional Block:  Femoral nerve block  Pre-Anesthetic Checklist: ,, timeout performed, Correct Patient, Correct Site, Correct Laterality, Correct Procedure, Correct Position, site marked, Risks and benefits discussed, Surgical consent,  Pre-op evaluation,  Post-op pain management  Laterality: Right  Prep: chloraprep       Needles:  Injection technique: Single-shot  Needle Type: Stimiplex     Needle Length: 10cm 10 cm Needle Gauge: 20 and 20 G    Additional Needles:  Procedures: ultrasound guided (picture in chart) and nerve stimulator  Motor weakness within 10 minutes. Femoral nerve block  Nerve Stimulator or Paresthesia:  Response: Quad, 0.5 mA, 0.2 ms,   Additional Responses:   Narrative:  Start time: 05/21/2012 7:20 AM End time: 05/21/2012 7:25 AM Injection made incrementally with aspirations every 5 mL.  Performed by: Personally  Anesthesiologist: Maurisa Tesmer  Additional Notes: + Raj test. No discomfort with

## 2012-05-21 NOTE — Op Note (Signed)
DATE OF SURGERY:  05/21/2012 TIME: 9:41 AM  PATIENT NAME:  Seth Mcintosh   AGE: 72 y.o.    PRE-OPERATIVE DIAGNOSIS:  DJD RIGHT KNEE  POST-OPERATIVE DIAGNOSIS:  Same  PROCEDURE:  Procedure(s): TOTAL KNEE ARTHROPLASTY   SURGEON:  Eulas Post, MD   ASSISTANT:  Skip Mayer, PA-C, present and scrubbed throughout the case, critical for assistance with exposure, retraction, instrumentation, and closure.   OPERATIVE IMPLANTS: Depuy PFC Sigma, Posterior Stabilized.  Femur size 6, Tibia size 5, Patella size 41 3-peg oval button, with a 10 mm polyethylene insert.   PREOPERATIVE INDICATIONS:  Seth Mcintosh is a 72 y.o. year old male with end stage bone on bone degenerative arthritis of the knee who failed conservative treatment, including injections, antiinflammatories, activity modification, and assistive devices, and had significant impairment of their activities of daily living, and elected for Total Knee Arthroplasty.   The risks, benefits, and alternatives were discussed at length including but not limited to the risks of infection, bleeding, nerve injury, stiffness, blood clots, the need for revision surgery, cardiopulmonary complications, among others, and they were willing to proceed.   OPERATIVE DESCRIPTION:  The patient was brought to the operative room and placed in a supine position.  General anesthesia was administered.  IV antibiotics were given.  The lower extremity was prepped and draped in the usual sterile fashion.  Time out was performed.  The leg was elevated and exsanguinated and the tourniquet was inflated.  Anterior quadriceps tendon splitting approach was performed.  The patella was everted and osteophytes were removed.  The anterior horn of the medial and lateral meniscus was removed.   The distal femur was opened with the drill and the intramedullary distal femoral cutting jig was utilized, set at 5 degrees resecting 11 mm off the distal femur.  Care was  taken to protect the collateral ligaments.  Then the extramedullary tibial cutting jig was utilized making the appropriate cut using the anterior tibial crest as a reference building in appropriate posterior slope.  Care was taken during the cut to protect the medial and collateral ligaments.  The proximal tibia was removed along with the posterior horns of the menisci.  The PCL was sacrificed.    The extensor gap was measured and was approximately 10mm.    The distal femoral sizing jig was applied, taking care to avoid notching.  Then the 4-in-1 cutting jig was applied and the anterior and posterior femur was cut, along with the chamfer cuts.  All posterior osteophytes were removed.  The flexion gap was then measured and was symmetric with the extension gap.  I completed the distal femoral preparation using the appropriate jig to prepare the box.  The patella was then measured to a 27, and cut to a 16 with the saw.    The proximal tibia sized and prepared accordingly with the reamer and the punch, and then all components were trialed with the 10mm poly insert.  The knee was found to have excellent balance and full motion.    The above named components were then cemented into place and all excess cement was removed.  The trial polyethylene component was in place during cementation, and then was exchanged for the real polyethylene component.    The knee was easily taken through a range of motion and the patella tracked well and the knee irrigated copiously and the parapatellar and subcutaneous tissue closed with vicryl, and monocryl with steri strips for the skin.  The wounds were  injected with marcaine, and dressed with sterile gauze and the tourniquet released and the patient was awakened and returned to the PACU in stable and satisfactory condition.  There were no complications.  Total tourniquet time was 90 minutes.

## 2012-05-21 NOTE — Anesthesia Postprocedure Evaluation (Signed)
Anesthesia Post Note  Patient: Seth Mcintosh  Procedure(s) Performed: Procedure(s) (LRB): TOTAL KNEE ARTHROPLASTY (Right)  Anesthesia type: General and FNB  Patient location: PACU  Post pain: Pain level controlled  Post assessment: Post-op Vital signs reviewed  Last Vitals: BP 122/73  Pulse 77  Temp 36.4 C (Oral)  Resp 14  SpO2 100%  Post vital signs: Reviewed  Level of consciousness: sedated  Complications: No apparent anesthesia complications

## 2012-05-21 NOTE — Preoperative (Signed)
Beta Blockers   Reason not to administer Beta Blockers:Not Applicable 

## 2012-05-21 NOTE — Transfer of Care (Signed)
Immediate Anesthesia Transfer of Care Note  Patient: Seth Mcintosh  Procedure(s) Performed: Procedure(s) (LRB) with comments: TOTAL KNEE ARTHROPLASTY (Right)  Patient Location: PACU  Anesthesia Type:General and Regional  Level of Consciousness: awake, alert , oriented and patient cooperative  Airway & Oxygen Therapy: Patient Spontanous Breathing and Patient connected to face mask oxygen  Post-op Assessment: Report given to PACU RN, Post -op Vital signs reviewed and stable and Patient moving all extremities  Post vital signs: Reviewed and stable  Complications: No apparent anesthesia complications

## 2012-05-21 NOTE — Evaluation (Signed)
Physical Therapy Evaluation Patient Details Name: Seth Mcintosh MRN: [id number] DOB: 1940/07/26 Today's Date: 05/21/2012 Time: 4098-1191 PT Time Calculation (min): 40 min  PT Assessment / Plan / Recommendation Clinical Impression  Pt. is s/p RTKa  05/21/12  who ambulted today. Pt. will benefit from PT and recommended that pt go to rehab.     PT Assessment  Patient needs continued PT services    Follow Up Recommendations  SNF    Does the patient have the potential to tolerate intense rehabilitation      Barriers to Discharge Decreased caregiver support      Equipment Recommendations  Rolling walker with 5" wheels    Recommendations for Other Services     Frequency 7X/week    Precautions / Restrictions Precautions Precautions: Knee Required Braces or Orthoses: Knee Immobilizer - Right Knee Immobilizer - Right: Discontinue once straight leg raise with < 10 degree lag   Pertinent Vitals/Pain 4 R knee, meds given.      Mobility  Bed Mobility Bed Mobility: Supine to Sit Supine to Sit: 4: Min assist;HOB flat Details for Bed Mobility Assistance: support of Rle to move to edge of bed. Transfers Transfers: Sit to Stand;Stand to Sit Sit to Stand: 4: Min assist;From bed;With upper extremity assist Stand to Sit: 4: Min assist;To chair/3-in-1;With upper extremity assist Details for Transfer Assistance: cues to reach back to recliner.  Ambulation/Gait Ambulation/Gait Assistance: 4: Min assist Ambulation Distance (Feet): 50 Feet Assistive device: Rolling walker Ambulation/Gait Assistance Details: cues for sequence. Gait Pattern: Step-to pattern;Decreased stance time - right    Shoulder Instructions     Exercises Total Joint Exercises Quad Sets: Strengthening;AROM;Right;10 reps Heel Slides: AROM;Right;10 reps   PT Diagnosis: Difficulty walking;Acute pain  PT Problem List: Decreased strength;Decreased range of motion;Decreased activity tolerance;Decreased  mobility;Decreased knowledge of precautions;Decreased safety awareness;Pain;Decreased knowledge of use of DME PT Treatment Interventions: DME instruction;Gait training;Functional mobility training;Therapeutic activities;Therapeutic exercise;Patient/family education   PT Goals Acute Rehab PT Goals PT Goal Formulation: With patient Time For Goal Achievement: 05/28/12 Potential to Achieve Goals: Good Pt will go Supine/Side to Sit: with supervision Pt will go Sit to Supine/Side: with supervision PT Goal: Sit to Supine/Side - Progress: Goal set today Pt will go Sit to Stand: with supervision PT Goal: Sit to Stand - Progress: Goal set today Pt will go Stand to Sit: with supervision PT Goal: Stand to Sit - Progress: Goal set today Pt will Ambulate: 51 - 150 feet;with supervision;with rolling walker PT Goal: Ambulate - Progress: Goal set today Pt will Perform Home Exercise Program: with supervision, verbal cues required/provided PT Goal: Perform Home Exercise Program - Progress: Goal set today  Visit Information  Last PT Received On: 05/21/12 Assistance Needed: +1    Subjective Data  Subjective: It feels so much better to be up Patient Stated Goal: i want to go home.   Prior Functioning  Home Living Lives With: Alone Available Help at Discharge: Skilled Nursing Facility (pt has no caregiver that can assist.) Type of Home: Apartment Home Access: Level entry Home Layout: One level Home Adaptive Equipment: None Prior Function Level of Independence: Independent Driving: Yes Vocation: Retired Musician: No difficulties    Cognition  Overall Cognitive Status: Appears within functional limits for tasks assessed/performed Arousal/Alertness: Awake/alert Orientation Level: Appears intact for tasks assessed Behavior During Session: [facility] for tasks performed    Extremity/Trunk Assessment Right Lower Extremity Assessment RLE ROM/Strength/Tone: Deficits RLE  ROM/Strength/Tone Deficits: assistance for SLR.knee flexion to 35. RLE Sensation: WFL -  Light Touch   Balance    End of Session PT - End of Session Equipment Utilized During Treatment: Right knee immobilizer Activity Tolerance: Patient tolerated treatment well Patient left: in chair;with call bell/phone within reach Nurse Communication: Mobility status  GP     Rada Hay 05/21/2012, 5:18 PM

## 2012-05-21 NOTE — Progress Notes (Signed)
Utilization review completed.  

## 2012-05-21 NOTE — H&P (Signed)
PREOPERATIVE H&P  Chief Complaint: DJD RIGHT KNEE  HPI: Seth Mcintosh is a 72 y.o. male who presents for preoperative history and physical with a diagnosis of DJD RIGHT KNEE. Symptoms are rated as moderate to severe, and have been worsening.  This is significantly impairing activities of daily living.  He has elected for surgical management. He has failed injections, exercises, NSAIDs couldn't be tolerated.  Past Medical History  Diagnosis Date  . Coronary artery disease   . Hypertension   . Arthritis     rheumatoid   . GERD (gastroesophageal reflux disease)   . Urgency of urination   . Diabetes mellitus without complication   . Sleeping difficulty     takes med nightly  . Depression    Past Surgical History  Procedure Date  . Foot surgery     x2  . Back surgery   . Coronary artery bypass graft 2004    x4 vessels  . Foot surgery     x2  . Transurethral resection of prostate    History   Social History  . Marital Status: Widowed    Spouse Name: N/A    Number of Children: N/A  . Years of Education: N/A   Social History Main Topics  . Smoking status: Former Smoker    Quit date: 05/14/1992  . Smokeless tobacco: None  . Alcohol Use: Yes     Comment: moderatly  . Drug Use: No  . Sexually Active:    Other Topics Concern  . None   Social History Narrative  . None   History reviewed. No pertinent family history. No Known Allergies Prior to Admission medications   Medication Sig Start Date End Date Taking? Authorizing Provider  buPROPion (WELLBUTRIN XL) 300 MG 24 hr tablet Take 300 mg by mouth daily before breakfast.   Yes Historical Provider, MD  busPIRone (BUSPAR) 15 MG tablet Take 15 mg by mouth daily before breakfast.   Yes Historical Provider, MD  clonazePAM (KLONOPIN) 0.5 MG tablet Take 0.5 mg by mouth 2 (two) times daily as needed. anxiety   Yes Historical Provider, MD  Eszopiclone (ESZOPICLONE) 3 MG TABS Take 3 mg by mouth at bedtime. Take immediately  before bedtime   Yes Historical Provider, MD  ezetimibe-simvastatin (VYTORIN) 10-20 MG per tablet Take 1 tablet by mouth daily before breakfast.   Yes Historical Provider, MD  fluticasone (FLONASE) 50 MCG/ACT nasal spray Place 2 sprays into the nose daily.   Yes Historical Provider, MD  irbesartan (AVAPRO) 150 MG tablet Take 150 mg by mouth daily before breakfast.   Yes Historical Provider, MD  pantoprazole (PROTONIX) 40 MG tablet Take 40 mg by mouth daily.   Yes Historical Provider, MD  Ascorbic Acid (VITAMIN C) 1000 MG tablet Take 2,000 mg by mouth daily.    Historical Provider, MD  aspirin EC 81 MG tablet Take 81 mg by mouth daily.    Historical Provider, MD  Cholecalciferol (VITAMIN D) 2000 UNITS tablet Take 2,000 Units by mouth daily.    Historical Provider, MD  Coenzyme Q10 (CO Q 10) 100 MG CAPS Take 1 capsule by mouth daily.    Historical Provider, MD  fish oil-omega-3 fatty acids 1000 MG capsule Take 2 g by mouth daily.    Historical Provider, MD  metFORMIN (GLUCOPHAGE) 500 MG tablet Take 500 mg by mouth 2 (two) times daily with a meal.    Historical Provider, MD  Multiple Vitamin (MULTIVITAMIN WITH MINERALS) TABS Take 1 tablet by mouth  daily.    Historical Provider, MD     Positive ROS: All other systems have been reviewed and were otherwise negative with the exception of those mentioned in the HPI and as above.  Physical Exam: General: Alert, no acute distress Cardiovascular: No pedal edema Respiratory: No cyanosis, no use of accessory musculature GI: No organomegaly, abdomen is soft and non-tender Skin: No lesions in the area of chief complaint Neurologic: Sensation intact distally Psychiatric: Patient is competent for consent with normal mood and affect Lymphatic: No axillary or cervical lymphadenopathy  MUSCULOSKELETAL: right knee with crepitance, stiffness, varus alignment  Assessment: DJD RIGHT KNEE  Plan: Plan for Procedure(s): TOTAL KNEE ARTHROPLASTY  The risks  benefits and alternatives were discussed with the patient including but not limited to the risks of nonoperative treatment, versus surgical intervention including infection, bleeding, nerve injury,  blood clots, cardiopulmonary complications, morbidity, mortality, among others, and they were willing to proceed.   Tallen Schnorr P, MD Cell [phone] Pager [phone]  05/21/2012 7:41 AM

## 2012-05-21 NOTE — Progress Notes (Signed)
ANTICOAGULATION CONSULT NOTE - Initial Consult  Pharmacy Consult for Warfarin Indication: VTE prophylaxis  No Known Allergies  Patient Measurements: 120kg on 05/14/12  Vital Signs: Temp: 98 F (36.7 C) (01/28 1154) Temp src: Oral (01/28 0606) BP: 131/83 mmHg (01/28 1154) Pulse Rate: 73  (01/28 1154)  Labs:  Basename 05/21/12 1245  HGB 14.3  HCT 41.7  PLT 259  APTT --  LABPROT --  INR --  HEPARINUNFRC --  CREATININE --  CKTOTAL --  CKMB --  TROPONINI --  05/14/12: PT/INR = 12.7/0.96; PTT = 29  CrCl = 64 ml/min/1.78m2 (normalized)   Medical History: Past Medical History  Diagnosis Date  . Coronary artery disease   . Hypertension   . Arthritis     rheumatoid   . GERD (gastroesophageal reflux disease)   . Urgency of urination   . Diabetes mellitus without complication   . Sleeping difficulty     takes med nightly  . Depression   . Localized osteoarthritis of right knee 05/21/2012    Medications:  Scheduled:    . acetaminophen  1,000 mg Intravenous Q6H  . aspirin EC  81 mg Oral Daily  . buPROPion  300 mg Oral QAC breakfast  . busPIRone  15 mg Oral QAC breakfast  . [COMPLETED]  ceFAZolin (ANCEF) IV  3 g Intravenous On Call to OR  .  ceFAZolin (ANCEF) IV  3 g Intravenous Q6H  . cholecalciferol  2,000 Units Oral Daily  . docusate sodium  100 mg Oral BID  . enoxaparin (LOVENOX) injection  30 mg Subcutaneous Q12H  . ezetimibe-simvastatin  1 tablet Oral QAC breakfast  . fluticasone  2 spray Each Nare Daily  . HYDROmorphone      . insulin aspart  0-15 Units Subcutaneous TID WC  . irbesartan  150 mg Oral QAC breakfast  . ketorolac  7.5 mg Intravenous Q6H  . multivitamin with minerals  1 tablet Oral Daily  . pantoprazole  40 mg Oral Daily  . senna  1 tablet Oral BID  . [DISCONTINUED] Co Q 10  1 capsule Oral Daily   Infusions:    . 0.45 % NaCl with KCl 20 mEq / L    . [DISCONTINUED] lactated ringers 1,000 mL (05/21/12 1113)    Assessment:  71 yom s/p  left TKA on 1/28  Begin warfarin for post-op VTE prophylaxis  Goal of Therapy:  INR 2-3   Plan:   Warfarin 7.5mg  po x 1 tonight  Lovenox 30mg  sq q12h starting 1/29 until INR >/= 1.8  Daily PT/INR  Loralee Pacas, PharmD, BCPS Pager: [phone] 05/21/2012,1:04 PM

## 2012-05-22 ENCOUNTER — Encounter (HOSPITAL_COMMUNITY): Payer: Self-pay | Admitting: Orthopedic Surgery

## 2012-05-22 LAB — BASIC METABOLIC PANEL
Calcium: 8.2 mg/dL — ABNORMAL LOW (ref 8.4–10.5)
GFR calc non Af Amer: 68 mL/min — ABNORMAL LOW (ref >90)
Glucose, Bld: 141 mg/dL — ABNORMAL HIGH (ref 70–99)
Sodium: 137 mEq/L (ref 135–145)

## 2012-05-22 LAB — CBC
Hemoglobin: 12.7 g/dL — ABNORMAL LOW (ref 13.0–17.0)
MCH: 32.2 pg (ref 26.0–34.0)
MCHC: 34.3 g/dL (ref 30.0–36.0)
RDW: 13.3 % (ref 11.5–15.5)

## 2012-05-22 LAB — GLUCOSE, CAPILLARY
Glucose-Capillary: 142 mg/dL — ABNORMAL HIGH (ref 70–99)
Glucose-Capillary: 150 mg/dL — ABNORMAL HIGH (ref 70–99)

## 2012-05-22 LAB — PROTIME-INR: Prothrombin Time: 13.4 seconds (ref 11.6–15.2)

## 2012-05-22 MED ORDER — WARFARIN SODIUM 7.5 MG PO TABS
7.5000 mg | ORAL_TABLET | Freq: Once | ORAL | Status: AC
  Administered 2012-05-22: 7.5 mg via ORAL
  Filled 2012-05-22: qty 1

## 2012-05-22 NOTE — Progress Notes (Signed)
ANTICOAGULATION CONSULT NOTE   Pharmacy Consult for Warfarin Indication: VTE prophylaxis  No Known Allergies  Patient Measurements: 120kg on 05/14/12  Vital Signs: Temp: 98.8 F (37.1 C) (01/29 1000) Temp src: Oral (01/29 1000) BP: 115/65 mmHg (01/29 1000) Pulse Rate: 66  (01/29 1000)  Labs:  Basename 05/22/12 0424 05/21/12 1245  HGB 12.7* 14.3  HCT 37.0* 41.7  PLT 206 259  APTT -- --  LABPROT 13.4 --  INR 1.03 --  HEPARINUNFRC -- --  CREATININE 1.08 0.96  CKTOTAL -- --  CKMB -- --  TROPONINI -- --  05/14/12: PT/INR = 12.7/0.96; PTT = 29    Medical History: Past Medical History  Diagnosis Date  . Coronary artery disease   . Hypertension   . Arthritis     rheumatoid   . GERD (gastroesophageal reflux disease)   . Urgency of urination   . Diabetes mellitus without complication   . Sleeping difficulty     takes med nightly  . Depression   . Localized osteoarthritis of right knee 05/21/2012    Medications:  Scheduled:     . [COMPLETED] acetaminophen  1,000 mg Intravenous Q6H  . aspirin EC  81 mg Oral Daily  . buPROPion  300 mg Oral QAC breakfast  . busPIRone  15 mg Oral QAC breakfast  . [COMPLETED]  ceFAZolin (ANCEF) IV  3 g Intravenous Q6H  . cholecalciferol  2,000 Units Oral Daily  . docusate sodium  100 mg Oral BID  . enoxaparin (LOVENOX) injection  30 mg Subcutaneous Q12H  . ezetimibe-simvastatin  1 tablet Oral QAC breakfast  . fluticasone  2 spray Each Nare Daily  . [EXPIRED] HYDROmorphone      . insulin aspart  0-15 Units Subcutaneous TID WC  . irbesartan  150 mg Oral QAC breakfast  . [COMPLETED] ketorolac  7.5 mg Intravenous Q6H  . multivitamin with minerals  1 tablet Oral Daily  . pantoprazole  40 mg Oral Daily  . [COMPLETED] patient's guide to using coumadin book   Does not apply Once  . senna  1 tablet Oral BID  . [COMPLETED] warfarin  7.5 mg Oral ONCE-1800  . warfarin   Does not apply Once  . Warfarin - Pharmacist Dosing Inpatient   Does  not apply q1800  . [DISCONTINUED] Co Q 10  1 capsule Oral Daily   Infusions:     . 0.45 % NaCl with KCl 20 mEq / L 75 mL/hr at 05/22/12 0405  . [DISCONTINUED] lactated ringers 1,000 mL (05/21/12 1113)    Assessment:  71 yom s/p left TKA on 1/28, pharmacy to dose warfarin for post-op VTE prophylaxis  Patient on enoxaparin 30mg  SQ q12h until INR >=1.8  INR = 1.03 following 1st dose of warfarin last pm  Hgb and plts OK, no bleeding noted  Goal of Therapy:  INR 2-3   Plan:   Warfarin 7.5mg  po x 1 tonight  Lovenox 30mg  sq q12h starting 1/29 until INR >/= 1.8  Daily PT/INR  Juliette Alcide, PharmD, BCPS.   Pager: [phone] 05/22/2012,11:55 AM

## 2012-05-22 NOTE — Progress Notes (Signed)
Clinical Social Work Department BRIEF PSYCHOSOCIAL ASSESSMENT 05/22/2012  Patient:  Seth Mcintosh, Seth Mcintosh     Account Number:  [id number]     Admit date:  05/21/2012  Clinical Social Worker:  Candie Chroman  Date/Time:  05/22/2012 01:46 PM  Referred by:  Physician  Date Referred:  05/22/2012 Referred for  SNF Placement   Other Referral:   Interview type:  Patient Other interview type:    PSYCHOSOCIAL DATA Living Status:  ALONE Admitted from facility:   Level of care:   Primary support name:  Lynita Lombard Primary support relationship to patient:  FRIEND Degree of support available:   unclear    CURRENT CONCERNS Current Concerns  Post-Acute Placement   Other Concerns:    SOCIAL WORK ASSESSMENT / PLAN Pt is a 72 yr old gentleman living at home prior to hospitalization. CSW met with pt to assist with d/c planning. Pt has made prior arrangements to have ST Rehab at [facility] following hospital d/c. CSW has contacted SNF and d/c plan has been confirmed. CSW will follow to assist with d/c planning to SNF.   Assessment/plan status:  Psychosocial Support/Ongoing Assessment of Needs Other assessment/ plan:   Information/referral to community resources:   None needed at this time.    PATIENT'S/FAMILY'S RESPONSE TO PLAN OF CARE: Pt feels rehab is needed before going home since he lives alone.    Cori Razor LCSW [phone]

## 2012-05-22 NOTE — Care Management Note (Signed)
    Page 1 of 1   05/22/2012     3:34:59 PM   CARE MANAGEMENT NOTE 05/22/2012  Patient:  Seth Mcintosh, Seth Mcintosh   Account Number:  [id number]  Date Initiated:  05/22/2012  Documentation initiated by:  Colleen Can  Subjective/Objective Assessment:   dx degenerative joint disease rt knee; total knee replacemnt     Action/Plan:   Plans are for SNF rehab   Anticipated DC Date:  05/24/2012   Anticipated DC Plan:  SKILLED NURSING FACILITY  In-house referral  Clinical Social Worker      DC Planning Services  CM consult      Choice offered to / List presented to:             Status of service:  Completed, signed off Medicare Important Message given?  NA - LOS <3 / Initial given by admissions (If response is "NO", the following Medicare IM given date fields will be blank) Date Medicare IM given:   Date Additional Medicare IM given:    Discharge Disposition:    Per UR Regulation:    If discussed at Long Length of Stay Meetings, dates discussed:    Comments:

## 2012-05-22 NOTE — Progress Notes (Signed)
OT Cancellation Note  Patient Details Name: Seth Mcintosh MRN: [id number] DOB: 06-22-1940   Cancelled Treatment:    Reason Eval/Treat Not Completed: Other (comment) (Pt discharging to snf.)  Corlette Ciano A OTR/L [phone] 05/22/2012, 11:15 AM

## 2012-05-22 NOTE — Progress Notes (Signed)
Physical Therapy Treatment Patient Details Name: Seth Mcintosh MRN: [id number] DOB: 08/11/1940 Today's Date: 05/22/2012 Time: 1478-2956 PT Time Calculation (min): 24 min  PT Assessment / Plan / Recommendation Comments on Treatment Session  Pt. tolerated ambulation x 150 ft well. Pt. plans to go to [facility].     Follow Up Recommendations  SNF     Does the patient have the potential to tolerate intense rehabilitation     Barriers to Discharge        Equipment Recommendations  Rolling walker with 5" wheels    Recommendations for Other Services    Frequency 7X/week   Plan Discharge plan remains appropriate;Frequency remains appropriate    Precautions / Restrictions Precautions Precautions: Knee Required Braces or Orthoses: Knee Immobilizer - Right Knee Immobilizer - Right: Discontinue once straight leg raise with < 10 degree lag Restrictions Weight Bearing Restrictions: No   Pertinent Vitals/Pain 4 R knee    Mobility  Bed Mobility Bed Mobility: Supine to Sit Supine to Sit: 5: Supervision;HOB elevated;With rails Transfers Sit to Stand: 4: Min guard;From bed;With upper extremity assist;From elevated surface Stand to Sit: To chair/3-in-1;With armrests;With upper extremity assist Details for Transfer Assistance: cues for hand and R leg position  Ambulation/Gait Ambulation/Gait Assistance: 4: Min guard Ambulation Distance (Feet): 150 Feet Assistive device: Rolling walker Ambulation/Gait Assistance Details: cues for posture and sequence. Gait Pattern: Step-through pattern    Exercises Total Joint Exercises Quad Sets: AROM;Right;10 reps Knee Flexion: AROM;Right;10 reps;Seated   PT Diagnosis:    PT Problem List:   PT Treatment Interventions:     PT Goals Acute Rehab PT Goals Pt will go Supine/Side to Sit: with modified independence PT Goal: Supine/Side to Sit - Progress: Met Pt will go Sit to Stand: with supervision PT Goal: Sit to Stand - Progress: Progressing  toward goal Pt will go Stand to Sit: with supervision PT Goal: Stand to Sit - Progress: Progressing toward goal Pt will Ambulate: 51 - 150 feet;with supervision PT Goal: Ambulate - Progress: Progressing toward goal Pt will Perform Home Exercise Program: with supervision, verbal cues required/provided PT Goal: Perform Home Exercise Program - Progress: Progressing toward goal  Visit Information  Last PT Received On: 05/22/12 Assistance Needed: +1    Subjective Data  Subjective: It. was bad after the block wore off.   Cognition  Overall Cognitive Status: Appears within functional limits for tasks assessed/performed    Balance     End of Session PT - End of Session Equipment Utilized During Treatment: Right knee immobilizer Activity Tolerance: Patient tolerated treatment well Patient left: in chair;with call bell/phone within reach Nurse Communication: Mobility status   GP     Rada Hay 05/22/2012, 9:47 AM [phone]

## 2012-05-22 NOTE — Progress Notes (Signed)
     Subjective:  Patient reports pain as moderate.  Patient lives alone, want to go to SNF.  Objective:   VITALS:   Filed Vitals:   05/22/12 0202 05/22/12 0410 05/22/12 0613 05/22/12 1000  BP: 120/79  123/73 115/65  Pulse: 76  79 66  Temp: 98.6 F (37 C)  99.3 F (37.4 C) 98.8 F (37.1 C)  TempSrc: Oral  Oral Oral  Resp: 16 18 19 18   Height:      Weight:      SpO2: 94% 96% 94% 90%    Neurologically intact Sensation intact distally Dorsiflexion/Plantar flexion intact Incision: dressing C/D/I   Lab Results  Component Value Date   WBC 8.1 05/22/2012   HGB 12.7* 05/22/2012   HCT 37.0* 05/22/2012   MCV 93.9 05/22/2012   PLT 206 05/22/2012     Assessment/Plan: 1 Day Post-Op   Principal Problem:  *Localized osteoarthritis of right knee   Advance diet Up with therapy Discharge to SNF plan fri or earlier if possible.  SW consult ordered.   Debi Cousin P 05/22/2012, 1:06 PM   Teryl Lucy, MD Cell [phone] Pager [phone]

## 2012-05-22 NOTE — Progress Notes (Signed)
Physical Therapy Treatment Patient Details Name: Seth Mcintosh MRN: [id number] DOB: [birth year] Today's Date: 05/22/2012 Time: 4098-1191 PT Time Calculation (min): 12 min  PT Assessment / Plan / Recommendation Comments on Treatment Session  pt, able to perform a SLR with effort.    Follow Up Recommendations  SNF     Does the patient have the potential to tolerate intense rehabilitation     Barriers to Discharge        Equipment Recommendations  Rolling walker with 5" wheels    Recommendations for Other Services    Frequency 7X/week   Plan Discharge plan remains appropriate;Frequency remains appropriate    Precautions / Restrictions Precautions Precautions: Knee Required Braces or Orthoses: Knee Immobilizer - Right Knee Immobilizer - Right: Discontinue once straight leg raise with < 10 degree lag   Pertinent Vitals/Pain     Mobility      Exercises Total Joint Exercises Quad Sets: AROM;Right;10 reps Heel Slides: AAROM;Right;10 reps Straight Leg Raises: AAROM;Right;10 reps Knee Flexion: AAROM;Right;10 reps   PT Diagnosis:    PT Problem List:   PT Treatment Interventions:     PT Goals Acute Rehab PT Goals Pt will go Supine/Side to Sit: with modified independence PT Goal: Supine/Side to Sit - Progress: Met Pt will go Sit to Stand: with supervision PT Goal: Sit to Stand - Progress: Progressing toward goal Pt will go Stand to Sit: with supervision PT Goal: Stand to Sit - Progress: Progressing toward goal Pt will Ambulate: 51 - 150 feet;with supervision PT Goal: Ambulate - Progress: Progressing toward goal Pt will Perform Home Exercise Program: with supervision, verbal cues required/provided PT Goal: Perform Home Exercise Program - Progress: Progressing toward goal  Visit Information  Last PT Received On: 05/22/12 Assistance Needed: +1    Subjective Data  Subjective: It feels better after I exercise.   Cognition  Overall Cognitive Status: Appears within  functional limits for tasks assessed/performed    Balance     End of Session PT - End of Session Equipment Utilized During Treatment: Right knee immobilizer Activity Tolerance: Patient tolerated treatment well Patient left: in chair Nurse Communication: Mobility status   GP     Rada Hay 05/22/2012, 12:48 PM

## 2012-05-23 LAB — CBC
MCH: 31.4 pg (ref 26.0–34.0)
MCV: 92.6 fL (ref 78.0–100.0)
Platelets: 220 10*3/uL (ref 150–400)
RDW: 13.1 % (ref 11.5–15.5)

## 2012-05-23 LAB — GLUCOSE, CAPILLARY: Glucose-Capillary: 121 mg/dL — ABNORMAL HIGH (ref 70–99)

## 2012-05-23 MED ORDER — FLEET ENEMA 7-19 GM/118ML RE ENEM
1.0000 | ENEMA | Freq: Every day | RECTAL | Status: DC | PRN
  Administered 2012-05-23: 1 via RECTAL
  Filled 2012-05-23: qty 1

## 2012-05-23 MED ORDER — BISACODYL 10 MG RE SUPP: 10.0000 mg | Freq: Every day | RECTAL | Status: DC | PRN

## 2012-05-23 MED ORDER — WARFARIN SODIUM 7.5 MG PO TABS
7.5000 mg | ORAL_TABLET | Freq: Once | ORAL | Status: AC
  Administered 2012-05-23: 7.5 mg via ORAL
  Filled 2012-05-23: qty 1

## 2012-05-23 NOTE — Progress Notes (Signed)
ANTICOAGULATION CONSULT NOTE   Pharmacy Consult for Warfarin Indication: VTE prophylaxis  No Known Allergies  Patient Measurements: 120kg on 05/14/12  Vital Signs: Temp: 97.3 F (36.3 C) (01/30 0535) Temp src: Oral (01/30 0535) BP: 178/91 mmHg (01/30 0535) Pulse Rate: 94  (01/30 0535)  Labs:  Basename 05/23/12 0440 05/22/12 0424 05/21/12 1245  HGB 13.2 12.7* --  HCT 38.9* 37.0* 41.7  PLT 220 206 259  APTT -- -- --  LABPROT 15.2 13.4 --  INR 1.22 1.03 --  HEPARINUNFRC -- -- --  CREATININE -- 1.08 0.96  CKTOTAL -- -- --  CKMB -- -- --  TROPONINI -- -- --  05/14/12: PT/INR = 12.7/0.96; PTT = 29    Medical History: Past Medical History  Diagnosis Date  . Coronary artery disease   . Hypertension   . Arthritis     rheumatoid   . GERD (gastroesophageal reflux disease)   . Urgency of urination   . Diabetes mellitus without complication   . Sleeping difficulty     takes med nightly  . Depression   . Localized osteoarthritis of right knee 05/21/2012    Medications:  Scheduled:     . aspirin EC  81 mg Oral Daily  . buPROPion  300 mg Oral QAC breakfast  . busPIRone  15 mg Oral QAC breakfast  . cholecalciferol  2,000 Units Oral Daily  . docusate sodium  100 mg Oral BID  . enoxaparin (LOVENOX) injection  30 mg Subcutaneous Q12H  . ezetimibe-simvastatin  1 tablet Oral QAC breakfast  . fluticasone  2 spray Each Nare Daily  . insulin aspart  0-15 Units Subcutaneous TID WC  . irbesartan  150 mg Oral QAC breakfast  . multivitamin with minerals  1 tablet Oral Daily  . pantoprazole  40 mg Oral Daily  . senna  1 tablet Oral BID  . [COMPLETED] warfarin  7.5 mg Oral ONCE-1800  . warfarin   Does not apply Once  . Warfarin - Pharmacist Dosing Inpatient   Does not apply q1800   Infusions:     . 0.45 % NaCl with KCl 20 mEq / L 20 mL/hr (05/23/12 0254)    Assessment:  71 yom s/p left TKA on 1/28, pharmacy to dose warfarin for post-op VTE prophylaxis  Patient on  enoxaparin 30mg  SQ q12h until INR >=1.8  INR = 1.22 following warfarin 7.5mg  x 2   Hgb and plts OK, no bleeding noted  Goal of Therapy:  INR 2-3   Plan:   Warfarin 7.5mg  po x 1 tonight  Lovenox 30mg  sq q12h starting 1/29 until INR >/= 1.8  Daily PT/INR  Juliette Alcide, PharmD, BCPS.   Pager: [phone] 05/23/2012,1:53 PM

## 2012-05-23 NOTE — Progress Notes (Signed)
Physical Therapy Treatment Patient Details Name: Seth Mcintosh MRN: [id number] DOB: 1940/05/06 Today's Date: 05/23/2012 Time: 7829-5621 PT Time Calculation (min): 19 min  PT Assessment / Plan / Recommendation Comments on Treatment Session  Amb pt another time to help with issue of constipation.  Then assisted back to bed.    Follow Up Recommendations  SNF     Does the patient have the potential to tolerate intense rehabilitation     Barriers to Discharge        Equipment Recommendations  Rolling walker with 5" wheels    Recommendations for Other Services    Frequency 7X/week   Plan Discharge plan remains appropriate    Precautions / Restrictions     Pertinent Vitals/Pain     Mobility  Bed Mobility Bed Mobility: Sit to Supine Details for Bed Mobility Assistance: Assisted pt back to bed with Min Assist to support R LE off bed  Amb with Min Guard Assist 165' with <25% VC's to increase posture and increase WB thru R LE. Assisted pt back to bed.   PT Goals                                          progressing   Visit Information  Last PT Received On: 05/23/12    Subjective Data      Cognition       Balance     End of Session PT - End of Session Equipment Utilized During Treatment: Gait belt;Right knee immobilizer Patient left: in bed;with call bell/phone within reach   Felecia Shelling  PTA [facility]  Acute  Rehab Pager      [phone]

## 2012-05-23 NOTE — Progress Notes (Signed)
Clinical Social Work Department CLINICAL SOCIAL WORK PLACEMENT NOTE 05/23/2012  Patient:  Seth Mcintosh, Seth Mcintosh  Account Number:  [id number] Admit date:  05/21/2012  Clinical Social Worker:  Cori Razor, LCSW  Date/time:  05/23/2012 01:54 PM  Clinical Social Work is seeking post-discharge placement for this patient at the following level of care:   SKILLED NURSING   (*CSW will update this form in Epic as items are completed)     Patient/family provided with [facility] Department of Clinical Social Work's list of facilities offering this level of care within the geographic area requested by the patient (or if unable, by the patient's family).  05/23/2012  Patient/family informed of their freedom to choose among providers that offer the needed level of care, that participate in Medicare, Medicaid or managed care program needed by the patient, have an available bed and are willing to accept the patient.    Patient/family informed of [facility]' ownership interest in [facility], as well as of the fact that they are under no obligation to receive care at this facility.  PASARR submitted to EDS on 05/22/2012 PASARR number received from [employer] on 05/22/2012  FL2 transmitted to all facilities in geographic area requested by pt/family on  05/21/2012 FL2 transmitted to all facilities within larger geographic area on   Patient informed that his/her managed care company has contracts with or will negotiate with  certain facilities, including the following:     Patient/family informed of bed offers received:   Patient chooses bed at [facility] Physician recommends and patient chooses bed at    Patient to be transferred to [facility] on   Patient to be transferred to facility by   The following physician request were entered in Epic:   Additional Comments:  Cori Razor LCSW [phone]

## 2012-05-23 NOTE — Progress Notes (Signed)
Physical Therapy Treatment Patient Details Name: Seth Mcintosh MRN: [id number] DOB: 19-Jun-1940 Today's Date: 05/23/2012 Time: 0865-7846 PT Time Calculation (min): 28 min  PT Assessment / Plan / Recommendation Comments on Treatment Session  POD #2 am session.  Pt c/o MAX ABD distention/bloating with inability to eat much.  Applied KI as pt was unablwe to perform a SLR, assisted pt OOB to amb in hallwat, then assisted to BR to trigger BM. Pt did small amount, assisted to recliner and applied ICE.    Follow Up Recommendations  SNF     Does the patient have the potential to tolerate intense rehabilitation     Barriers to Discharge        Equipment Recommendations  Rolling walker with 5" wheels    Recommendations for Other Services    Frequency 7X/week   Plan Discharge plan remains appropriate    Precautions / Restrictions Precautions Precautions: Knee Precaution Comments: Instructed pt on KI use for amb Required Braces or Orthoses: Knee Immobilizer - Right Knee Immobilizer - Right: Discontinue once straight leg raise with < 10 degree lag Restrictions Weight Bearing Restrictions: No Other Position/Activity Restrictions: WBAT   Pertinent Vitals/Pain C/o 8/10 with SLR "none" during amb ICE applied    Mobility  Bed Mobility Bed Mobility: Supine to Sit;Sitting - Scoot to Edge of Bed Supine to Sit: 4: Min assist Sitting - Scoot to [place] of Bed: 4: Min assist Details for Bed Mobility Assistance: Min assist to support R LE and increased time to scoot to EOB Transfers Transfers: Sit to Stand;Stand to Sit Sit to Stand: 4: Min guard;From elevated surface;From bed;From toilet Stand to Sit: 4: Min guard;To toilet;To chair/3-in-1 Details for Transfer Assistance: <25% VC's on proper tech and hand placement to control decend Ambulation/Gait Ambulation/Gait Assistance: 4: Min guard Ambulation Distance (Feet): 108 Feet Ambulation/Gait Assistance Details: decreased amb distance this am  due to increased c/o ABD bloating/inability to have BM.  Pt also required increased time and VC's to equal WB thru B LE's.  Gait Pattern: Step-to pattern;Step-through pattern;Decreased stance time - right Gait velocity: decreased    Exercises  Unable to tolerate until next round of pain meds   PT Goals                                                         Progressing slowly    Visit Information  Last PT Received On: 05/23/12 Assistance Needed: +1    Subjective Data  Subjective: I feel like there is a watermelon in my stomach Patient Stated Goal: home   Cognition    good   Balance   fair +  End of Session PT - End of Session Equipment Utilized During Treatment: Gait belt;Right knee immobilizer Activity Tolerance: Patient limited by pain;Other (comment) (ABD discomfort)   Felecia Shelling  PTA [facility]  Acute  Rehab Pager      [phone]

## 2012-05-23 NOTE — Progress Notes (Signed)
     Subjective:  Patient reports pain as mild.  C/o constipation.    Objective:   VITALS:   Filed Vitals:   05/22/12 1000 05/22/12 1400 05/22/12 2138 05/23/12 0535  BP: 115/65 152/104 163/92 178/91  Pulse: 66 106 92 94  Temp: 98.8 F (37.1 C) 99.1 F (37.3 C) 99.1 F (37.3 C) 97.3 F (36.3 C)  TempSrc: Oral Oral Oral Oral  Resp: 18 20 20 18   Height:      Weight:      SpO2: 90% 93% 90% 94%    Neurologically intact Sensation intact distally Dorsiflexion/Plantar flexion intact Incision: dressing C/D/I and no drainage   Lab Results  Component Value Date   WBC 10.6* 05/23/2012   HGB 13.2 05/23/2012   HCT 38.9* 05/23/2012   MCV 92.6 05/23/2012   PLT 220 05/23/2012     Assessment/Plan: 2 Days Post-Op   Principal Problem:  *Localized osteoarthritis of right knee   Advance diet Up with therapy Discharge to SNF friday   Janisa Labus P 05/23/2012, 10:59 AM   Teryl Lucy, MD Cell [phone] Pager [phone]

## 2012-05-23 NOTE — Progress Notes (Signed)
NT notified of elevated B/P @ 162/90.  Pt noted on Avapro due on dayshift. Noted a few B/P's elevated over the last 24hrs Patient with no complaint or discomfort at this time. Will follow up with dayshift RN and rounding Doctor to advise. Will continue to monitor patient per Doctor order and unit protocol.

## 2012-05-23 NOTE — Progress Notes (Signed)
Physical Therapy Treatment Patient Details Name: Seth Mcintosh MRN: [id number] DOB: July 16, 1940 Today's Date: 05/23/2012 Time: 1191-4782.sign PT Time Calculation (min): 25 min  PT Assessment / Plan / Recommendation Comments on Treatment Session  Assisted pt OOB. Amb pt 3rd time today to progress GI. Pt plans to D/C to SNF for Rehab.    Follow Up Recommendations  SNF     Does the patient have the potential to tolerate intense rehabilitation     Barriers to Discharge        Equipment Recommendations  Rolling walker with 5" wheels    Recommendations for Other Services    Frequency 7X/week   Plan Discharge plan remains appropriate    Precautions / Restrictions   KI for amb until able to perform 10 active SLR  Pertinent Vitals/Pain C/o 4/10 R knee pain States ABD is doing better after RN gave him an enema    Mobility  Bed Mobility Bed Mobility: Supine to Sit Supine to Sit: 4: Min assist Sit to Supine: 4: Min assist Details for Bed Mobility Assistance: Min assist to support R LE off and back onto bed Transfers Transfers: Sit to Stand;Stand to Sit Sit to Stand: 4: Min guard;From bed Stand to Sit: To bed;4: Min guard Details for Transfer Assistance: <25% VC's on proper tech and hand placement to control decend  Ambulation/Gait Ambulation/Gait Assistance: 4: Min guard Ambulation Distance (Feet): 122 Feet Assistive device: Rolling walker Ambulation/Gait Assistance Details: 25% VC's to decrease amb speed and increase WB thru R LE Gait Pattern: Step-to pattern;Decreased stance time - right     PT Goals                                     progressing    Visit Information  Last PT Received On: 05/23/12 Assistance Needed: +1    Subjective Data      Cognition       Balance     End of Session PT - End of Session Equipment Utilized During Treatment: Gait belt;Right knee immobilizer Activity Tolerance: Patient tolerated treatment well Patient left: in bed;with call  bell/phone within reach;with family/visitor present   Felecia Shelling  PTA [facility]  Acute  Rehab Pager      [phone].

## 2012-05-24 DIAGNOSIS — G8928 Other chronic postprocedural pain: Secondary | ICD-10-CM | POA: Diagnosis not present

## 2012-05-24 DIAGNOSIS — F411 Generalized anxiety disorder: Secondary | ICD-10-CM | POA: Diagnosis not present

## 2012-05-24 DIAGNOSIS — Z471 Aftercare following joint replacement surgery: Secondary | ICD-10-CM | POA: Diagnosis not present

## 2012-05-24 DIAGNOSIS — R262 Difficulty in walking, not elsewhere classified: Secondary | ICD-10-CM | POA: Diagnosis not present

## 2012-05-24 DIAGNOSIS — E119 Type 2 diabetes mellitus without complications: Secondary | ICD-10-CM | POA: Diagnosis not present

## 2012-05-24 DIAGNOSIS — E559 Vitamin D deficiency, unspecified: Secondary | ICD-10-CM | POA: Diagnosis not present

## 2012-05-24 DIAGNOSIS — E139 Other specified diabetes mellitus without complications: Secondary | ICD-10-CM | POA: Diagnosis not present

## 2012-05-24 DIAGNOSIS — K219 Gastro-esophageal reflux disease without esophagitis: Secondary | ICD-10-CM | POA: Diagnosis not present

## 2012-05-24 DIAGNOSIS — M6281 Muscle weakness (generalized): Secondary | ICD-10-CM | POA: Diagnosis not present

## 2012-05-24 DIAGNOSIS — L0390 Cellulitis, unspecified: Secondary | ICD-10-CM | POA: Diagnosis not present

## 2012-05-24 DIAGNOSIS — F329 Major depressive disorder, single episode, unspecified: Secondary | ICD-10-CM | POA: Diagnosis not present

## 2012-05-24 DIAGNOSIS — E785 Hyperlipidemia, unspecified: Secondary | ICD-10-CM | POA: Diagnosis not present

## 2012-05-24 DIAGNOSIS — M1990 Unspecified osteoarthritis, unspecified site: Secondary | ICD-10-CM | POA: Diagnosis not present

## 2012-05-24 DIAGNOSIS — I10 Essential (primary) hypertension: Secondary | ICD-10-CM | POA: Diagnosis not present

## 2012-05-24 DIAGNOSIS — M25569 Pain in unspecified knee: Secondary | ICD-10-CM | POA: Diagnosis not present

## 2012-05-24 DIAGNOSIS — Z7901 Long term (current) use of anticoagulants: Secondary | ICD-10-CM | POA: Diagnosis not present

## 2012-05-24 DIAGNOSIS — S99929A Unspecified injury of unspecified foot, initial encounter: Secondary | ICD-10-CM | POA: Diagnosis not present

## 2012-05-24 LAB — CBC
HCT: 37.8 % — ABNORMAL LOW (ref 39.0–52.0)
Hemoglobin: 13.3 g/dL (ref 13.0–17.0)
RDW: 13.3 % (ref 11.5–15.5)
WBC: 10.1 10*3/uL (ref 4.0–10.5)

## 2012-05-24 LAB — GLUCOSE, CAPILLARY: Glucose-Capillary: 134 mg/dL — ABNORMAL HIGH (ref 70–99)

## 2012-05-24 LAB — PROTIME-INR: INR: 1.7 — ABNORMAL HIGH (ref 0.00–1.49)

## 2012-05-24 MED ORDER — WARFARIN SODIUM 5 MG PO TABS
5.0000 mg | ORAL_TABLET | Freq: Once | ORAL | Status: DC
  Filled 2012-05-24: qty 1

## 2012-05-24 MED ORDER — LOPERAMIDE HCL 2 MG PO CAPS
2.0000 mg | ORAL_CAPSULE | Freq: Once | ORAL | Status: AC
  Administered 2012-05-24: 2 mg via ORAL
  Filled 2012-05-24: qty 1

## 2012-05-24 NOTE — Progress Notes (Signed)
Discharge summary sent to payer through [facility]  

## 2012-05-24 NOTE — Discharge Summary (Signed)
Physician Discharge Summary  Patient ID: Seth Mcintosh MRN: [id number] DOB/AGE: 71-12-1940 71 y.o.  Admit date: 05/21/2012 Discharge date: 05/24/2012  Admission Diagnoses:  Localized osteoarthritis of right knee  Discharge Diagnoses:  Principal Problem:  *Localized osteoarthritis of right knee   Past Medical History  Diagnosis Date  . Coronary artery disease   . Hypertension   . Arthritis     rheumatoid   . GERD (gastroesophageal reflux disease)   . Urgency of urination   . Diabetes mellitus without complication   . Sleeping difficulty     takes med nightly  . Depression   . Localized osteoarthritis of right knee 05/21/2012    Surgeries: Procedure(s): TOTAL KNEE ARTHROPLASTY on 05/21/2012   Consultants (if any):    Discharged Condition: Improved  Hospital Course: Seth Mcintosh is an 72 y.o. male who was admitted 05/21/2012 with a diagnosis of Localized osteoarthritis of right knee and went to the operating room on 05/21/2012 and underwent the above named procedures.    He was given perioperative antibiotics:  Anti-infectives     Start     Dose/Rate Route Frequency Ordered Stop   05/21/12 1400   ceFAZolin (ANCEF) 3 g in dextrose 5 % 50 mL IVPB        3 g 160 mL/hr over 30 Minutes Intravenous Every 6 hours 05/21/12 1159 05/21/12 2114   05/21/12 0600   ceFAZolin (ANCEF) 3 g in dextrose 5 % 50 mL IVPB        3 g 160 mL/hr over 30 Minutes Intravenous On call to O.R. 05/21/12 0532 05/21/12 8295        .  He was given sequential compression devices, early ambulation, and lovenox bridging to coumadin for DVT prophylaxis.  He benefited maximally from the hospital stay and there were no complications.    Recent vital signs:  Filed Vitals:   05/24/12 0700  BP: 188/92  Pulse: 95  Temp: 97.6 F (36.4 C)  Resp: 20    Recent laboratory studies:  Lab Results  Component Value Date   HGB 13.3 05/24/2012   HGB 13.2 05/23/2012   HGB 12.7* 05/22/2012   Lab Results   Component Value Date   WBC 10.1 05/24/2012   PLT 214 05/24/2012   Lab Results  Component Value Date   INR 1.70* 05/24/2012   Lab Results  Component Value Date   NA 137 05/22/2012   K 3.9 05/22/2012   CL 103 05/22/2012   CO2 25 05/22/2012   BUN 16 05/22/2012   CREATININE 1.08 05/22/2012   GLUCOSE 141* 05/22/2012    Discharge Medications:     Medication List     As of 05/24/2012  7:09 AM    TAKE these medications         aspirin EC 81 MG tablet   Take 81 mg by mouth daily.      buPROPion 300 MG 24 hr tablet   Commonly known as: WELLBUTRIN XL   Take 300 mg by mouth daily before breakfast.      busPIRone 15 MG tablet   Commonly known as: BUSPAR   Take 15 mg by mouth daily before breakfast.      clonazePAM 0.5 MG tablet   Commonly known as: KLONOPIN   Take 0.5 mg by mouth 2 (two) times daily as needed. anxiety      Co Q 10 100 MG Caps   Take 1 capsule by mouth daily.      eszopiclone 3  MG Tabs   Generic drug: Eszopiclone   Take 3 mg by mouth at bedtime. Take immediately before bedtime      ezetimibe-simvastatin 10-20 MG per tablet   Commonly known as: VYTORIN   Take 1 tablet by mouth daily before breakfast.      fish oil-omega-3 fatty acids 1000 MG capsule   Take 2 g by mouth daily.      fluticasone 50 MCG/ACT nasal spray   Commonly known as: FLONASE   Place 2 sprays into the nose daily.      irbesartan 150 MG tablet   Commonly known as: AVAPRO   Take 150 mg by mouth daily before breakfast.      metFORMIN 500 MG tablet   Commonly known as: GLUCOPHAGE   Take 500 mg by mouth 2 (two) times daily with a meal.      multivitamin with minerals Tabs   Take 1 tablet by mouth daily.      oxyCODONE-acetaminophen 10-325 MG per tablet   Commonly known as: PERCOCET   Take 1-2 tablets by mouth every 6 (six) hours as needed for pain. MAXIMUM TOTAL ACETAMINOPHEN DOSE IS 4000 MG PER DAY      pantoprazole 40 MG tablet   Commonly known as: PROTONIX   Take 40 mg by mouth  daily.      sennosides-docusate sodium 8.6-50 MG tablet   Commonly known as: SENOKOT-S   Take 1 tablet by mouth daily.      vitamin C 1000 MG tablet   Take 2,000 mg by mouth daily.      Vitamin D 2000 UNITS tablet   Take 2,000 Units by mouth daily.      warfarin 5 MG tablet   Commonly known as: COUMADIN   Take 1 tablet (5 mg total) by mouth daily.        Diagnostic Studies: Dg Chest 2 View  05/14/2012  *RADIOLOGY REPORT*  Clinical Data: Preop knee replacement  CHEST - 2 VIEW  Comparison: None  Findings: Postop CABG.  Heart size normal.  Negative heart failure. Lungs are clear without infiltrate or mass lesion.  IMPRESSION: No acute cardiopulmonary abnormality.   Original Report Authenticated By: Janeece Riggers, M.D.    Dg Knee Right Port  05/21/2012  *RADIOLOGY REPORT*  Clinical Data: Postop right total knee replacement  PORTABLE RIGHT KNEE - 1-2 VIEW  Comparison: None.  Findings: Post right total knee replacement without evidence of hardware failure or loosening.  Alignment appears near anatomic. No fracture.  There is an expected small postoperative joint effusion with a minimal amount of interarticular air.  There is a minimal amount of subcutaneous emphysema about the lateral aspect of the knee.  Several surgical clips overlie the medial aspect of the knee. No radiopaque foreign body.  IMPRESSION: Post right total knee replacement without evidence of complication.   Original Report Authenticated By: Tacey Ruiz, MD     Disposition: Final discharge disposition not confirmed      Discharge Orders    Future Orders Please Complete By Expires   Diet general      Weight bearing as tolerated      Call MD / Call 911      Comments:   If you experience chest pain or shortness of breath, CALL 911 and be transported to the hospital emergency room.  If you develope a fever above 101 F, pus (white drainage) or increased drainage or redness at the wound, or calf pain, call your surgeon's  office.   Discharge instructions      Comments:   Change dressing in 3 days and reapply fresh dressing, unless you have a splint (half cast).  If you have a splint/cast, just leave in place until your follow-up appointment.    Keep wounds dry for 3 weeks.  Leave steri-strips in place on skin.  Do not apply lotion or anything to the wound.   Constipation Prevention      Comments:   Drink plenty of fluids.  Prune juice may be helpful.  You may use a stool softener, such as Colace (over the counter) 100 mg twice a day.  Use MiraLax (over the counter) for constipation as needed.   TED hose      Comments:   Use stockings (TED hose) for 2 weeks on both leg(s).  You may remove them at night for sleeping.   Change dressing      Comments:   Change dressing in three days, then change the dressing daily with sterile 4 x 4 inch gauze dressing.  You may clean the incision with alcohol prior to redressing.   Do not put a pillow under the knee. Place it under the heel.         Follow-up Information    Follow up with Eulas Post, MD. Schedule an appointment as soon as possible for a visit in 2 weeks.   Contact information:   [address] [place] [address] [phone]           Signed: Eulas Post 05/24/2012, 7:09 AM

## 2012-05-24 NOTE — Progress Notes (Signed)
ANTICOAGULATION CONSULT NOTE   Pharmacy Consult for Warfarin Indication: VTE prophylaxis  No Known Allergies  Patient Measurements: 120kg on 05/14/12  Vital Signs: Temp: 97.6 F (36.4 C) (01/31 0700) Temp src: Oral (01/31 0700) BP: 188/92 mmHg (01/31 0700) Pulse Rate: 95  (01/31 0700)  Labs:  Basename 05/24/12 0450 05/23/12 0440 05/22/12 0424 05/21/12 1245  HGB 13.3 13.2 -- --  HCT 37.8* 38.9* 37.0* --  PLT 214 220 206 --  APTT -- -- -- --  LABPROT 19.4* 15.2 13.4 --  INR 1.70* 1.22 1.03 --  HEPARINUNFRC -- -- -- --  CREATININE -- -- 1.08 0.96  CKTOTAL -- -- -- --  CKMB -- -- -- --  TROPONINI -- -- -- --  05/14/12: PT/INR = 12.7/0.96; PTT = 29  Medications:  Scheduled:     . aspirin EC  81 mg Oral Daily  . buPROPion  300 mg Oral QAC breakfast  . busPIRone  15 mg Oral QAC breakfast  . cholecalciferol  2,000 Units Oral Daily  . docusate sodium  100 mg Oral BID  . enoxaparin (LOVENOX) injection  30 mg Subcutaneous Q12H  . ezetimibe-simvastatin  1 tablet Oral QAC breakfast  . fluticasone  2 spray Each Nare Daily  . insulin aspart  0-15 Units Subcutaneous TID WC  . irbesartan  150 mg Oral QAC breakfast  . [COMPLETED] loperamide  2 mg Oral Once  . multivitamin with minerals  1 tablet Oral Daily  . pantoprazole  40 mg Oral Daily  . senna  1 tablet Oral BID  . [COMPLETED] warfarin  7.5 mg Oral ONCE-1800  . warfarin   Does not apply Once  . Warfarin - Pharmacist Dosing Inpatient   Does not apply q1800   Assessment:  71 yom s/p left TKA on 1/28, pharmacy to dose warfarin for post-op VTE prophylaxis  Patient on enoxaparin 30mg  SQ q12h until INR >=1.8  INR = 1.70 following warfarin 7.5mg  x 3   Hgb and plts OK, no bleeding noted  Goal of Therapy:  INR 2-3   Plan:   Warfarin 5mg  po x 1 at 12N today, plan SNF today, prescription for Warfarin 5mg  tablet.  Lovenox 30mg  sq q12h starting 1/29 until INR >/= 1.8  Daily PT/INR  Otho Bellows PharmD   Pager:  [phone] 05/24/2012,7:57 AM

## 2012-05-24 NOTE — Progress Notes (Signed)
Pt with three  Concurrent loose stools. IV site  lost due to urgency  In having loose stools while patient attempted to get to Bathroom with assist x1. Patient very adamant to call Doctor to get something to help stop these loose stools. Pt states " this is abnormal something must be wrong. I need you to call the Doctor to get something to help the frequency of these loose stools".  Patient reminded of the the day shift meds taken for constipation and that they are being effective at this time. Patient with continued request to call Doctor.  Call out to covering Doctor Took, explaining situation apologetically due to patient being very persistent.  Doctor Took instructed to give patient what he needs, and tell him thanks for waking him up.  Imodium 2mg  po given to patient.   Patient with relief, and informed nursing staff " thank you for caring for your patient and the community...just like the commercial". Follow up with charge Nurse Reita Cliche and day shift Nurse Amy.

## 2012-05-24 NOTE — Progress Notes (Signed)
Physical Therapy Treatment Patient Details Name: LASHAUN KRAPF MRN: [id number] DOB: 01-09-1941 Today's Date: 05/24/2012 Time: 5284-1324 PT Time Calculation (min): 31 min  PT Assessment / Plan / Recommendation Comments on Treatment Session  POD # 3 R TKR pt plans to D/C to [facility] for ST Rehab today. Assisted pt OOB to bathroom then amb in hallway. Then EMS came to transport pt so assisted to stretcher.     Follow Up Recommendations  SNF     Does the patient have the potential to tolerate intense rehabilitation     Barriers to Discharge        Equipment Recommendations  Rolling walker with 5" wheels    Recommendations for Other Services    Frequency 7X/week   Plan Discharge plan remains appropriate    Precautions / Restrictions Precautions Precautions: Knee Precaution Comments: Instructed pt on KI use for amb  Required Braces or Orthoses: Knee Immobilizer - Right Knee Immobilizer - Right: Discontinue once straight leg raise with < 10 degree lag Restrictions Weight Bearing Restrictions: No Other Position/Activity Restrictions: WBAT   Pertinent Vitals/Pain C/o some nausea    Mobility  Bed Mobility Bed Mobility: Supine to Sit Supine to Sit: 4: Min assist Details for Bed Mobility Assistance: Min assist to support R LE off bed Transfers Transfers: Sit to Stand;Stand to Sit Sit to Stand: 4: Min guard;From bed;From toilet Stand to Sit: 4: Min guard;To toilet (EMS stretcher) Details for Transfer Assistance: <25% VC's on proper tech and hand placement to control decend  Ambulation/Gait Ambulation/Gait Assistance: 4: Min guard Ambulation Distance (Feet): 86 Feet Assistive device: Rolling walker Ambulation/Gait Assistance Details: 25% VC's to decrease amb speed and increase WB thru R LE Gait Pattern: Step-to pattern;Decreased stance time - right Gait velocity: decreased    PT Goals                                        progressing    Visit Information  Last PT  Received On: 05/24/12    Subjective Data      Cognition       Balance     End of Session PT - End of Session Equipment Utilized During Treatment: Gait belt;Right knee immobilizer Activity Tolerance: Patient tolerated treatment well Patient left: Other (comment) Teaching laboratory technician) Nurse Communication: Mobility status   Felecia Shelling  PTA [facility]  Acute  Rehab Pager      [phone]

## 2012-05-24 NOTE — Progress Notes (Signed)
Clinical Social Work Department CLINICAL SOCIAL WORK PLACEMENT NOTE 05/24/2012  Patient:  Seth Mcintosh, Seth Mcintosh  Account Number:  [id number] Admit date:  05/21/2012  Clinical Social Worker:  Cori Razor, LCSW  Date/time:  05/23/2012 01:54 PM  Clinical Social Work is seeking post-discharge placement for this patient at the following level of care:   SKILLED NURSING   (*CSW will update this form in Epic as items are completed)     Patient/family provided with [facility] Department of Clinical Social Work's list of facilities offering this level of care within the geographic area requested by the patient (or if unable, by the patient's family).  05/23/2012  Patient/family informed of their freedom to choose among providers that offer the needed level of care, that participate in Medicare, Medicaid or managed care program needed by the patient, have an available bed and are willing to accept the patient.    Patient/family informed of [facility]' ownership interest in [facility], as well as of the fact that they are under no obligation to receive care at this facility.  PASARR submitted to EDS on 05/22/2012 PASARR number received from EDS on 05/22/2012  FL2 transmitted to all facilities in geographic area requested by pt/family on  05/21/2012 FL2 transmitted to all facilities within larger geographic area on   Patient informed that his/her managed care company has contracts with or will negotiate with  certain facilities, including the following:     Patient/family informed of bed offers received:   Patient chooses bed at [facility] Physician recommends and patient chooses bed at    Patient to be transferred to [facility] on  05/24/2012 Patient to be transferred to facility by [facility]  The following physician request were entered in Epic:   Additional Comments:  Cori Razor LCSW [phone]

## 2012-05-27 DIAGNOSIS — Z7901 Long term (current) use of anticoagulants: Secondary | ICD-10-CM | POA: Diagnosis not present

## 2012-05-27 DIAGNOSIS — L0291 Cutaneous abscess, unspecified: Secondary | ICD-10-CM | POA: Diagnosis not present

## 2012-05-27 DIAGNOSIS — I10 Essential (primary) hypertension: Secondary | ICD-10-CM | POA: Diagnosis not present

## 2012-05-27 DIAGNOSIS — E139 Other specified diabetes mellitus without complications: Secondary | ICD-10-CM | POA: Diagnosis not present

## 2012-06-06 DIAGNOSIS — G8928 Other chronic postprocedural pain: Secondary | ICD-10-CM | POA: Diagnosis not present

## 2012-06-06 DIAGNOSIS — E139 Other specified diabetes mellitus without complications: Secondary | ICD-10-CM | POA: Diagnosis not present

## 2012-06-06 DIAGNOSIS — Z7901 Long term (current) use of anticoagulants: Secondary | ICD-10-CM | POA: Diagnosis not present

## 2012-06-06 DIAGNOSIS — I10 Essential (primary) hypertension: Secondary | ICD-10-CM | POA: Diagnosis not present

## 2012-06-10 DIAGNOSIS — E119 Type 2 diabetes mellitus without complications: Secondary | ICD-10-CM | POA: Diagnosis not present

## 2012-06-10 DIAGNOSIS — Z96659 Presence of unspecified artificial knee joint: Secondary | ICD-10-CM | POA: Diagnosis not present

## 2012-06-10 DIAGNOSIS — I10 Essential (primary) hypertension: Secondary | ICD-10-CM | POA: Diagnosis not present

## 2012-06-10 DIAGNOSIS — F329 Major depressive disorder, single episode, unspecified: Secondary | ICD-10-CM | POA: Diagnosis not present

## 2012-06-10 DIAGNOSIS — Z471 Aftercare following joint replacement surgery: Secondary | ICD-10-CM | POA: Diagnosis not present

## 2012-06-10 DIAGNOSIS — M1710 Unilateral primary osteoarthritis, unspecified knee: Secondary | ICD-10-CM | POA: Diagnosis not present

## 2012-06-12 DIAGNOSIS — I10 Essential (primary) hypertension: Secondary | ICD-10-CM | POA: Diagnosis not present

## 2012-06-12 DIAGNOSIS — E119 Type 2 diabetes mellitus without complications: Secondary | ICD-10-CM | POA: Diagnosis not present

## 2012-06-12 DIAGNOSIS — F329 Major depressive disorder, single episode, unspecified: Secondary | ICD-10-CM | POA: Diagnosis not present

## 2012-06-12 DIAGNOSIS — Z96659 Presence of unspecified artificial knee joint: Secondary | ICD-10-CM | POA: Diagnosis not present

## 2012-06-12 DIAGNOSIS — Z471 Aftercare following joint replacement surgery: Secondary | ICD-10-CM | POA: Diagnosis not present

## 2012-06-13 DIAGNOSIS — Z96659 Presence of unspecified artificial knee joint: Secondary | ICD-10-CM | POA: Diagnosis not present

## 2012-06-13 DIAGNOSIS — Z471 Aftercare following joint replacement surgery: Secondary | ICD-10-CM | POA: Diagnosis not present

## 2012-06-13 DIAGNOSIS — I10 Essential (primary) hypertension: Secondary | ICD-10-CM | POA: Diagnosis not present

## 2012-06-13 DIAGNOSIS — F329 Major depressive disorder, single episode, unspecified: Secondary | ICD-10-CM | POA: Diagnosis not present

## 2012-06-13 DIAGNOSIS — E119 Type 2 diabetes mellitus without complications: Secondary | ICD-10-CM | POA: Diagnosis not present

## 2012-06-14 DIAGNOSIS — F329 Major depressive disorder, single episode, unspecified: Secondary | ICD-10-CM | POA: Diagnosis not present

## 2012-06-14 DIAGNOSIS — Z96659 Presence of unspecified artificial knee joint: Secondary | ICD-10-CM | POA: Diagnosis not present

## 2012-06-14 DIAGNOSIS — Z471 Aftercare following joint replacement surgery: Secondary | ICD-10-CM | POA: Diagnosis not present

## 2012-06-14 DIAGNOSIS — E119 Type 2 diabetes mellitus without complications: Secondary | ICD-10-CM | POA: Diagnosis not present

## 2012-06-14 DIAGNOSIS — I10 Essential (primary) hypertension: Secondary | ICD-10-CM | POA: Diagnosis not present

## 2012-06-17 DIAGNOSIS — I10 Essential (primary) hypertension: Secondary | ICD-10-CM | POA: Diagnosis not present

## 2012-06-17 DIAGNOSIS — Z471 Aftercare following joint replacement surgery: Secondary | ICD-10-CM | POA: Diagnosis not present

## 2012-06-17 DIAGNOSIS — E119 Type 2 diabetes mellitus without complications: Secondary | ICD-10-CM | POA: Diagnosis not present

## 2012-06-17 DIAGNOSIS — Z96659 Presence of unspecified artificial knee joint: Secondary | ICD-10-CM | POA: Diagnosis not present

## 2012-06-17 DIAGNOSIS — F329 Major depressive disorder, single episode, unspecified: Secondary | ICD-10-CM | POA: Diagnosis not present

## 2012-06-18 DIAGNOSIS — I10 Essential (primary) hypertension: Secondary | ICD-10-CM | POA: Diagnosis not present

## 2012-06-18 DIAGNOSIS — Z96659 Presence of unspecified artificial knee joint: Secondary | ICD-10-CM | POA: Diagnosis not present

## 2012-06-18 DIAGNOSIS — Z471 Aftercare following joint replacement surgery: Secondary | ICD-10-CM | POA: Diagnosis not present

## 2012-06-18 DIAGNOSIS — F329 Major depressive disorder, single episode, unspecified: Secondary | ICD-10-CM | POA: Diagnosis not present

## 2012-06-18 DIAGNOSIS — E119 Type 2 diabetes mellitus without complications: Secondary | ICD-10-CM | POA: Diagnosis not present

## 2012-06-19 DIAGNOSIS — Z96659 Presence of unspecified artificial knee joint: Secondary | ICD-10-CM | POA: Diagnosis not present

## 2012-06-19 DIAGNOSIS — F329 Major depressive disorder, single episode, unspecified: Secondary | ICD-10-CM | POA: Diagnosis not present

## 2012-06-19 DIAGNOSIS — I10 Essential (primary) hypertension: Secondary | ICD-10-CM | POA: Diagnosis not present

## 2012-06-19 DIAGNOSIS — Z471 Aftercare following joint replacement surgery: Secondary | ICD-10-CM | POA: Diagnosis not present

## 2012-06-19 DIAGNOSIS — E119 Type 2 diabetes mellitus without complications: Secondary | ICD-10-CM | POA: Diagnosis not present

## 2012-06-20 DIAGNOSIS — Z96659 Presence of unspecified artificial knee joint: Secondary | ICD-10-CM | POA: Diagnosis not present

## 2012-06-20 DIAGNOSIS — E119 Type 2 diabetes mellitus without complications: Secondary | ICD-10-CM | POA: Diagnosis not present

## 2012-06-20 DIAGNOSIS — Z471 Aftercare following joint replacement surgery: Secondary | ICD-10-CM | POA: Diagnosis not present

## 2012-06-20 DIAGNOSIS — I10 Essential (primary) hypertension: Secondary | ICD-10-CM | POA: Diagnosis not present

## 2012-06-20 DIAGNOSIS — F329 Major depressive disorder, single episode, unspecified: Secondary | ICD-10-CM | POA: Diagnosis not present

## 2012-06-21 DIAGNOSIS — Z471 Aftercare following joint replacement surgery: Secondary | ICD-10-CM | POA: Diagnosis not present

## 2012-06-21 DIAGNOSIS — E119 Type 2 diabetes mellitus without complications: Secondary | ICD-10-CM | POA: Diagnosis not present

## 2012-06-21 DIAGNOSIS — I10 Essential (primary) hypertension: Secondary | ICD-10-CM | POA: Diagnosis not present

## 2012-06-21 DIAGNOSIS — F329 Major depressive disorder, single episode, unspecified: Secondary | ICD-10-CM | POA: Diagnosis not present

## 2012-06-21 DIAGNOSIS — Z96659 Presence of unspecified artificial knee joint: Secondary | ICD-10-CM | POA: Diagnosis not present

## 2012-06-25 DIAGNOSIS — Z96659 Presence of unspecified artificial knee joint: Secondary | ICD-10-CM | POA: Diagnosis not present

## 2012-06-25 DIAGNOSIS — M25569 Pain in unspecified knee: Secondary | ICD-10-CM | POA: Diagnosis not present

## 2012-06-26 DIAGNOSIS — E119 Type 2 diabetes mellitus without complications: Secondary | ICD-10-CM | POA: Diagnosis not present

## 2012-06-27 DIAGNOSIS — M25569 Pain in unspecified knee: Secondary | ICD-10-CM | POA: Diagnosis not present

## 2012-06-27 DIAGNOSIS — Z96659 Presence of unspecified artificial knee joint: Secondary | ICD-10-CM | POA: Diagnosis not present

## 2012-07-02 DIAGNOSIS — Z96659 Presence of unspecified artificial knee joint: Secondary | ICD-10-CM | POA: Diagnosis not present

## 2012-07-02 DIAGNOSIS — M25569 Pain in unspecified knee: Secondary | ICD-10-CM | POA: Diagnosis not present

## 2012-07-05 DIAGNOSIS — M25569 Pain in unspecified knee: Secondary | ICD-10-CM | POA: Diagnosis not present

## 2012-07-05 DIAGNOSIS — Z96659 Presence of unspecified artificial knee joint: Secondary | ICD-10-CM | POA: Diagnosis not present

## 2012-07-12 DIAGNOSIS — Z96659 Presence of unspecified artificial knee joint: Secondary | ICD-10-CM | POA: Diagnosis not present

## 2012-07-12 DIAGNOSIS — M25569 Pain in unspecified knee: Secondary | ICD-10-CM | POA: Diagnosis not present

## 2012-07-17 DIAGNOSIS — Z96659 Presence of unspecified artificial knee joint: Secondary | ICD-10-CM | POA: Diagnosis not present

## 2012-07-17 DIAGNOSIS — M25569 Pain in unspecified knee: Secondary | ICD-10-CM | POA: Diagnosis not present

## 2012-07-19 DIAGNOSIS — M25569 Pain in unspecified knee: Secondary | ICD-10-CM | POA: Diagnosis not present

## 2012-07-19 DIAGNOSIS — Z96659 Presence of unspecified artificial knee joint: Secondary | ICD-10-CM | POA: Diagnosis not present

## 2012-07-23 DIAGNOSIS — Z96659 Presence of unspecified artificial knee joint: Secondary | ICD-10-CM | POA: Diagnosis not present

## 2012-07-23 DIAGNOSIS — M25569 Pain in unspecified knee: Secondary | ICD-10-CM | POA: Diagnosis not present

## 2012-07-26 DIAGNOSIS — I10 Essential (primary) hypertension: Secondary | ICD-10-CM | POA: Diagnosis not present

## 2012-07-26 DIAGNOSIS — E785 Hyperlipidemia, unspecified: Secondary | ICD-10-CM | POA: Diagnosis not present

## 2012-07-26 DIAGNOSIS — E119 Type 2 diabetes mellitus without complications: Secondary | ICD-10-CM | POA: Diagnosis not present

## 2012-07-29 DIAGNOSIS — G4700 Insomnia, unspecified: Secondary | ICD-10-CM | POA: Diagnosis not present

## 2012-07-29 DIAGNOSIS — E119 Type 2 diabetes mellitus without complications: Secondary | ICD-10-CM | POA: Diagnosis not present

## 2012-07-29 DIAGNOSIS — E785 Hyperlipidemia, unspecified: Secondary | ICD-10-CM | POA: Diagnosis not present

## 2012-07-29 DIAGNOSIS — F411 Generalized anxiety disorder: Secondary | ICD-10-CM | POA: Diagnosis not present

## 2012-08-21 DIAGNOSIS — M1710 Unilateral primary osteoarthritis, unspecified knee: Secondary | ICD-10-CM | POA: Diagnosis not present

## 2012-09-26 DIAGNOSIS — G4700 Insomnia, unspecified: Secondary | ICD-10-CM | POA: Diagnosis not present

## 2012-09-26 DIAGNOSIS — F411 Generalized anxiety disorder: Secondary | ICD-10-CM | POA: Diagnosis not present

## 2012-10-12 DIAGNOSIS — I10 Essential (primary) hypertension: Secondary | ICD-10-CM | POA: Diagnosis not present

## 2012-10-12 DIAGNOSIS — E119 Type 2 diabetes mellitus without complications: Secondary | ICD-10-CM | POA: Diagnosis not present

## 2012-10-12 DIAGNOSIS — J019 Acute sinusitis, unspecified: Secondary | ICD-10-CM | POA: Diagnosis not present

## 2012-10-12 DIAGNOSIS — R5383 Other fatigue: Secondary | ICD-10-CM | POA: Diagnosis not present

## 2012-11-19 DIAGNOSIS — I119 Hypertensive heart disease without heart failure: Secondary | ICD-10-CM | POA: Diagnosis not present

## 2012-11-19 DIAGNOSIS — I2510 Atherosclerotic heart disease of native coronary artery without angina pectoris: Secondary | ICD-10-CM | POA: Diagnosis not present

## 2012-11-19 DIAGNOSIS — E782 Mixed hyperlipidemia: Secondary | ICD-10-CM | POA: Diagnosis not present

## 2012-11-19 DIAGNOSIS — I359 Nonrheumatic aortic valve disorder, unspecified: Secondary | ICD-10-CM | POA: Diagnosis not present

## 2012-11-20 DIAGNOSIS — M1710 Unilateral primary osteoarthritis, unspecified knee: Secondary | ICD-10-CM | POA: Diagnosis not present

## 2012-11-20 DIAGNOSIS — K219 Gastro-esophageal reflux disease without esophagitis: Secondary | ICD-10-CM | POA: Diagnosis not present

## 2012-11-20 DIAGNOSIS — E785 Hyperlipidemia, unspecified: Secondary | ICD-10-CM | POA: Diagnosis not present

## 2012-11-20 DIAGNOSIS — I10 Essential (primary) hypertension: Secondary | ICD-10-CM | POA: Diagnosis not present

## 2012-11-20 DIAGNOSIS — E119 Type 2 diabetes mellitus without complications: Secondary | ICD-10-CM | POA: Diagnosis not present

## 2012-12-11 DIAGNOSIS — F411 Generalized anxiety disorder: Secondary | ICD-10-CM | POA: Diagnosis not present

## 2012-12-11 DIAGNOSIS — G4700 Insomnia, unspecified: Secondary | ICD-10-CM | POA: Diagnosis not present

## 2012-12-12 DIAGNOSIS — R011 Cardiac murmur, unspecified: Secondary | ICD-10-CM | POA: Diagnosis not present

## 2012-12-19 DIAGNOSIS — I119 Hypertensive heart disease without heart failure: Secondary | ICD-10-CM | POA: Diagnosis not present

## 2012-12-19 DIAGNOSIS — E782 Mixed hyperlipidemia: Secondary | ICD-10-CM | POA: Diagnosis not present

## 2012-12-19 DIAGNOSIS — I059 Rheumatic mitral valve disease, unspecified: Secondary | ICD-10-CM | POA: Diagnosis not present

## 2012-12-19 DIAGNOSIS — I2510 Atherosclerotic heart disease of native coronary artery without angina pectoris: Secondary | ICD-10-CM | POA: Diagnosis not present

## 2013-01-15 DIAGNOSIS — M1710 Unilateral primary osteoarthritis, unspecified knee: Secondary | ICD-10-CM | POA: Diagnosis not present

## 2013-02-10 DIAGNOSIS — G4700 Insomnia, unspecified: Secondary | ICD-10-CM | POA: Diagnosis not present

## 2013-02-10 DIAGNOSIS — Z23 Encounter for immunization: Secondary | ICD-10-CM | POA: Diagnosis not present

## 2013-02-10 DIAGNOSIS — F411 Generalized anxiety disorder: Secondary | ICD-10-CM | POA: Diagnosis not present

## 2013-03-10 ENCOUNTER — Encounter: Payer: Self-pay | Admitting: Podiatry

## 2013-03-14 ENCOUNTER — Ambulatory Visit: Payer: Medicare Other | Admitting: Podiatry

## 2013-04-08 ENCOUNTER — Ambulatory Visit (INDEPENDENT_AMBULATORY_CARE_PROVIDER_SITE_OTHER): Payer: Medicare Other | Admitting: Podiatry

## 2013-04-08 ENCOUNTER — Ambulatory Visit (INDEPENDENT_AMBULATORY_CARE_PROVIDER_SITE_OTHER): Payer: Medicare Other

## 2013-04-08 ENCOUNTER — Encounter: Payer: Self-pay | Admitting: Podiatry

## 2013-04-08 VITALS — BP 125/69 | HR 68 | Resp 16 | Ht 74.5 in | Wt 255.0 lb

## 2013-04-08 DIAGNOSIS — M2040 Other hammer toe(s) (acquired), unspecified foot: Secondary | ICD-10-CM

## 2013-04-08 DIAGNOSIS — M2042 Other hammer toe(s) (acquired), left foot: Secondary | ICD-10-CM

## 2013-04-08 DIAGNOSIS — M898X9 Other specified disorders of bone, unspecified site: Secondary | ICD-10-CM | POA: Diagnosis not present

## 2013-04-08 NOTE — Progress Notes (Signed)
   Subjective:    Patient ID: Seth Mcintosh, male    DOB: 1940-11-09, 72 y.o.   MRN: [id number]  HPI Comments: Type 2 diabetic , and having trouble with my left foot,   N lesion  L left #4/5 toes lesion in between the toes  D 3-4 months  O C stays better till start walking  A walking the 5th toe goes under the 4th  T toe spacer   Diabetes      Review of Systems  Endocrine:       Diabetic  All other systems reviewed and are negative.       Objective:   Physical Exam        Assessment & Plan:

## 2013-04-08 NOTE — Progress Notes (Signed)
Subjective:     Patient ID: Seth Mcintosh, male   DOB: 08/02/1940, 72 y.o.   MRN: [id number]  Diabetes   patient presents stating I'm having trouble with my left fifth toe with a lesion and also I might need new inserts as I had a knee replacement done. Patient's not been seen for over 3 years and has to exercise do to the knee replacement done in March   Review of Systems  All other systems reviewed and are negative.       Objective:   Physical Exam  Nursing note and vitals reviewed. Constitutional: He is oriented to person, place, and time.  Cardiovascular: Intact distal pulses.   Musculoskeletal: Normal range of motion.  Neurological: He is oriented to person, place, and time.  Skin: Skin is warm.   neurovascular status intact with mild equinus condition noted and edema and slight varicosities in the ankle region of both feet. Distal keratotic lesion fifth left with rotated toe noted and scar proximal fifth left were had removed a previous bone spur     Assessment:     Distal hammertoe deformity fifth left with distal exostotic lesion that is painful when pressed that the left    Plan:     H&P and x-rays reviewed and today I debrided distal lesion with discussion of possible distal arthroplasty exostectomy if symptoms return quickly. Also discussed possibility for new orthotics depending on how he is doing

## 2013-04-21 DIAGNOSIS — L821 Other seborrheic keratosis: Secondary | ICD-10-CM | POA: Diagnosis not present

## 2013-04-21 DIAGNOSIS — L723 Sebaceous cyst: Secondary | ICD-10-CM | POA: Diagnosis not present

## 2013-04-21 DIAGNOSIS — L578 Other skin changes due to chronic exposure to nonionizing radiation: Secondary | ICD-10-CM | POA: Diagnosis not present

## 2013-04-21 DIAGNOSIS — D239 Other benign neoplasm of skin, unspecified: Secondary | ICD-10-CM | POA: Diagnosis not present

## 2013-04-21 DIAGNOSIS — L570 Actinic keratosis: Secondary | ICD-10-CM | POA: Diagnosis not present

## 2013-04-30 DIAGNOSIS — N529 Male erectile dysfunction, unspecified: Secondary | ICD-10-CM | POA: Diagnosis not present

## 2013-04-30 DIAGNOSIS — G4700 Insomnia, unspecified: Secondary | ICD-10-CM | POA: Diagnosis not present

## 2013-04-30 DIAGNOSIS — F411 Generalized anxiety disorder: Secondary | ICD-10-CM | POA: Diagnosis not present

## 2013-04-30 IMAGING — CR DG CHEST 2V
2 series · 2 of 2 positions shown · non-contrast
Comparison: None

CLINICAL DATA: Preop knee replacement

CHEST - 2 VIEW

[w chest pa]
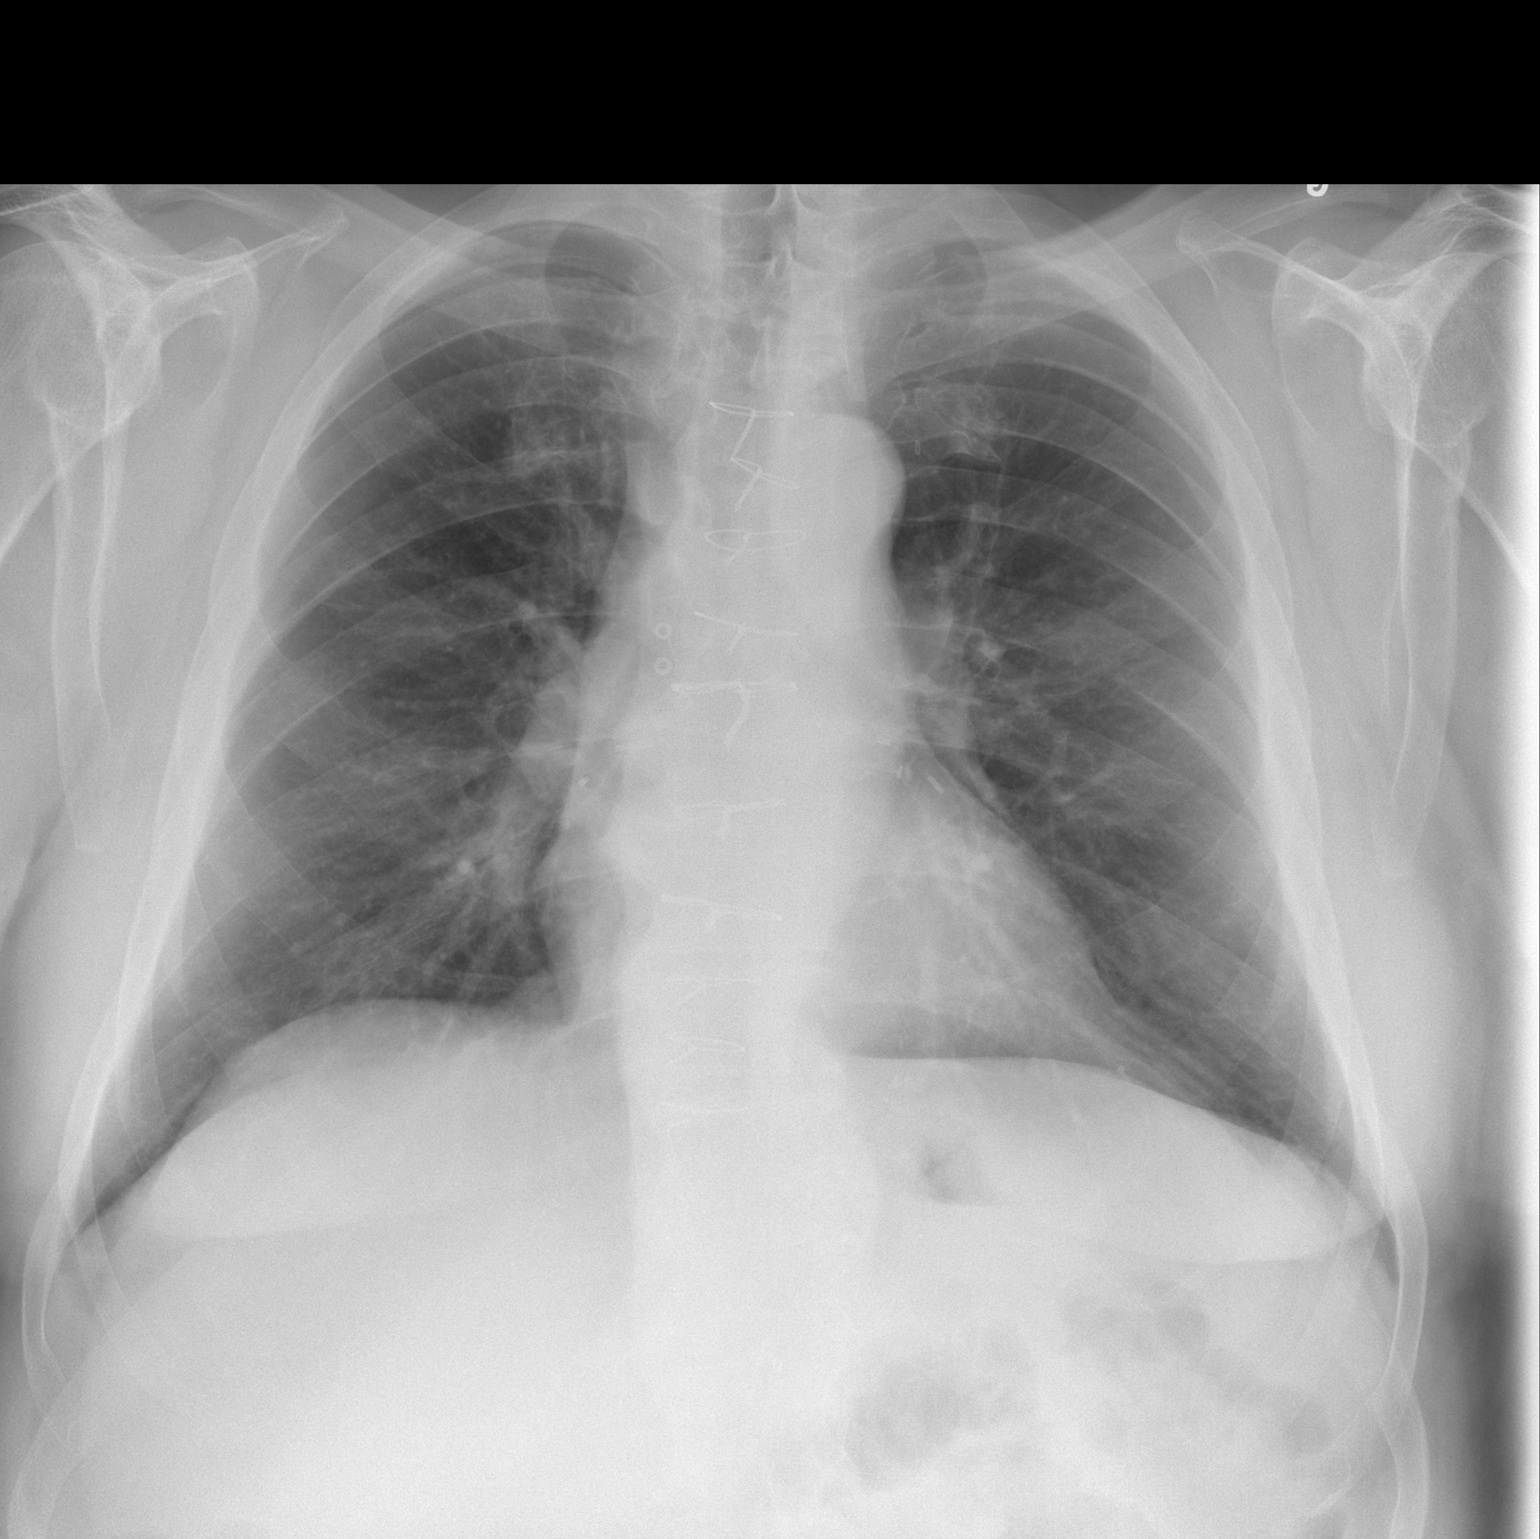

[w chest lat]
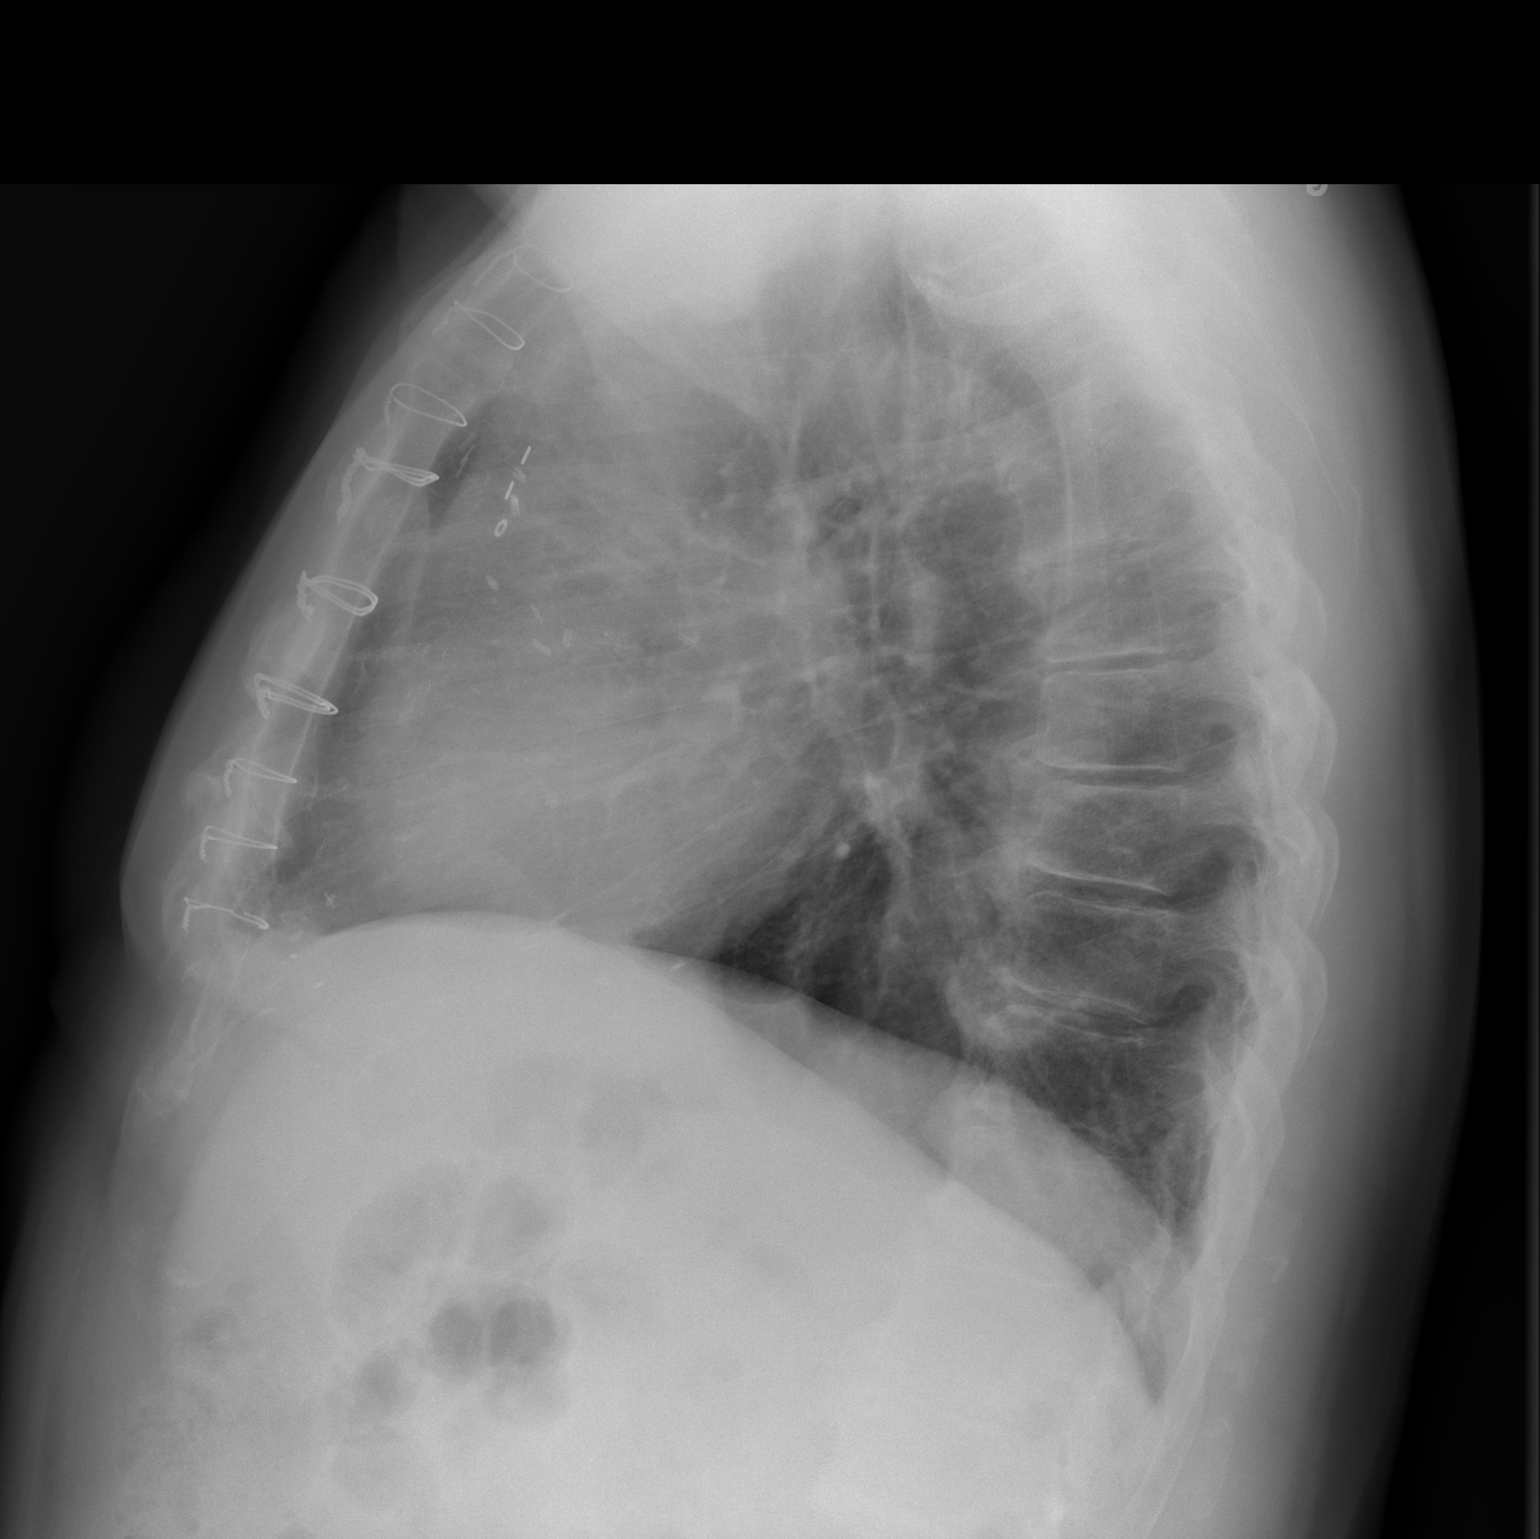

[2 of 2 positions shown; findings below may reference images not displayed]

FINDINGS: Postop CABG.  Heart size normal.  Negative heart failure.
Lungs are clear without infiltrate or mass lesion.
IMPRESSION: No acute cardiopulmonary abnormality.

## 2013-05-28 DIAGNOSIS — Z125 Encounter for screening for malignant neoplasm of prostate: Secondary | ICD-10-CM | POA: Diagnosis not present

## 2013-05-28 DIAGNOSIS — E119 Type 2 diabetes mellitus without complications: Secondary | ICD-10-CM | POA: Diagnosis not present

## 2013-05-28 DIAGNOSIS — N529 Male erectile dysfunction, unspecified: Secondary | ICD-10-CM | POA: Diagnosis not present

## 2013-07-11 DIAGNOSIS — F411 Generalized anxiety disorder: Secondary | ICD-10-CM | POA: Diagnosis not present

## 2013-07-11 DIAGNOSIS — G4700 Insomnia, unspecified: Secondary | ICD-10-CM | POA: Diagnosis not present

## 2013-07-11 DIAGNOSIS — R3911 Hesitancy of micturition: Secondary | ICD-10-CM | POA: Diagnosis not present

## 2013-07-11 DIAGNOSIS — E119 Type 2 diabetes mellitus without complications: Secondary | ICD-10-CM | POA: Diagnosis not present

## 2013-07-14 DIAGNOSIS — I10 Essential (primary) hypertension: Secondary | ICD-10-CM | POA: Diagnosis not present

## 2013-07-14 DIAGNOSIS — E119 Type 2 diabetes mellitus without complications: Secondary | ICD-10-CM | POA: Diagnosis not present

## 2013-07-14 DIAGNOSIS — I2510 Atherosclerotic heart disease of native coronary artery without angina pectoris: Secondary | ICD-10-CM | POA: Diagnosis not present

## 2013-07-14 DIAGNOSIS — E785 Hyperlipidemia, unspecified: Secondary | ICD-10-CM | POA: Diagnosis not present

## 2013-07-15 ENCOUNTER — Telehealth: Payer: Self-pay | Admitting: *Deleted

## 2013-07-15 NOTE — Telephone Encounter (Signed)
Amy stated pt called regarding his toe and she made him appt with dr Milinda Pointer on wed 3.25.15. Pt states he is dr regals pt and dr regal trimmed a bone spur from his 5th toe on the left foot and now it is swelling and turning dark. Told pt we would see him tomorrow at his sch appt time. Pt understood.

## 2013-07-16 ENCOUNTER — Ambulatory Visit (INDEPENDENT_AMBULATORY_CARE_PROVIDER_SITE_OTHER): Payer: Medicare Other | Admitting: Podiatry

## 2013-07-16 VITALS — BP 118/75 | HR 69 | Resp 16

## 2013-07-16 DIAGNOSIS — E119 Type 2 diabetes mellitus without complications: Secondary | ICD-10-CM | POA: Diagnosis not present

## 2013-07-16 DIAGNOSIS — Q828 Other specified congenital malformations of skin: Secondary | ICD-10-CM | POA: Diagnosis not present

## 2013-07-16 DIAGNOSIS — M898X9 Other specified disorders of bone, unspecified site: Secondary | ICD-10-CM

## 2013-07-16 DIAGNOSIS — M2040 Other hammer toe(s) (acquired), unspecified foot: Secondary | ICD-10-CM | POA: Diagnosis not present

## 2013-07-16 NOTE — Progress Notes (Signed)
He presents today with a chief complaint of a painful fourth and fifth digit of the left foot. He denies fever chills nausea vomiting muscle aches and pains.  Objective: Vital signs are stable he is alert and oriented x3. Pulses are palpable left lower extremity. 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 The juxtaposition of these toes results and reactive hyperkeratosis his to the medial aspect of the fifth and the lateral aspect of the fourth. There is no erythema edema cellulitis drainage or odor to the fourth and fifth toes of the left foot. A reactive hyperkeratosis is noted as mentioned.  Assessment: Diabetes mellitus without complication hammertoe deformity #4 #5 of the left foot with porokeratotic lesion.  Plan: Discussed etiology pathology conservative versus surgical therapies debrided the reactive hyperkeratosis today and I will followup with him on an as-needed basis for surgical consult. We discussed appropriate shoe gear stretching exercises and padding.

## 2013-07-18 DIAGNOSIS — R350 Frequency of micturition: Secondary | ICD-10-CM | POA: Diagnosis not present

## 2013-07-18 DIAGNOSIS — R351 Nocturia: Secondary | ICD-10-CM | POA: Diagnosis not present

## 2013-08-08 DIAGNOSIS — R351 Nocturia: Secondary | ICD-10-CM | POA: Diagnosis not present

## 2013-08-15 DIAGNOSIS — K2100 Gastro-esophageal reflux disease with esophagitis, without bleeding: Secondary | ICD-10-CM | POA: Diagnosis not present

## 2013-08-15 DIAGNOSIS — E785 Hyperlipidemia, unspecified: Secondary | ICD-10-CM | POA: Diagnosis not present

## 2013-08-15 DIAGNOSIS — I10 Essential (primary) hypertension: Secondary | ICD-10-CM | POA: Diagnosis not present

## 2013-08-15 DIAGNOSIS — E119 Type 2 diabetes mellitus without complications: Secondary | ICD-10-CM | POA: Diagnosis not present

## 2013-09-01 DIAGNOSIS — E119 Type 2 diabetes mellitus without complications: Secondary | ICD-10-CM | POA: Diagnosis not present

## 2013-09-01 DIAGNOSIS — R351 Nocturia: Secondary | ICD-10-CM | POA: Diagnosis not present

## 2013-09-19 DIAGNOSIS — F411 Generalized anxiety disorder: Secondary | ICD-10-CM | POA: Diagnosis not present

## 2013-09-19 DIAGNOSIS — R5383 Other fatigue: Secondary | ICD-10-CM | POA: Diagnosis not present

## 2013-09-19 DIAGNOSIS — E119 Type 2 diabetes mellitus without complications: Secondary | ICD-10-CM | POA: Diagnosis not present

## 2013-09-19 DIAGNOSIS — G4700 Insomnia, unspecified: Secondary | ICD-10-CM | POA: Diagnosis not present

## 2013-09-19 DIAGNOSIS — R5381 Other malaise: Secondary | ICD-10-CM | POA: Diagnosis not present

## 2013-09-25 DIAGNOSIS — J301 Allergic rhinitis due to pollen: Secondary | ICD-10-CM | POA: Diagnosis not present

## 2013-09-25 DIAGNOSIS — J343 Hypertrophy of nasal turbinates: Secondary | ICD-10-CM | POA: Diagnosis not present

## 2013-09-25 DIAGNOSIS — J342 Deviated nasal septum: Secondary | ICD-10-CM | POA: Diagnosis not present

## 2013-09-25 DIAGNOSIS — G479 Sleep disorder, unspecified: Secondary | ICD-10-CM | POA: Diagnosis not present

## 2013-10-15 ENCOUNTER — Ambulatory Visit: Payer: Self-pay | Admitting: Otolaryngology

## 2013-10-15 DIAGNOSIS — F5105 Insomnia due to other mental disorder: Secondary | ICD-10-CM | POA: Diagnosis not present

## 2013-10-15 DIAGNOSIS — F489 Nonpsychotic mental disorder, unspecified: Secondary | ICD-10-CM | POA: Diagnosis not present

## 2013-10-15 DIAGNOSIS — R0609 Other forms of dyspnea: Secondary | ICD-10-CM | POA: Diagnosis not present

## 2013-10-15 DIAGNOSIS — G4733 Obstructive sleep apnea (adult) (pediatric): Secondary | ICD-10-CM | POA: Diagnosis not present

## 2013-10-15 DIAGNOSIS — G478 Other sleep disorders: Secondary | ICD-10-CM | POA: Diagnosis not present

## 2013-11-10 DIAGNOSIS — M1710 Unilateral primary osteoarthritis, unspecified knee: Secondary | ICD-10-CM | POA: Diagnosis not present

## 2013-11-11 ENCOUNTER — Ambulatory Visit: Payer: Self-pay | Admitting: Otolaryngology

## 2013-11-11 DIAGNOSIS — G4733 Obstructive sleep apnea (adult) (pediatric): Secondary | ICD-10-CM | POA: Diagnosis not present

## 2013-11-11 DIAGNOSIS — F5105 Insomnia due to other mental disorder: Secondary | ICD-10-CM | POA: Diagnosis not present

## 2013-11-11 DIAGNOSIS — F489 Nonpsychotic mental disorder, unspecified: Secondary | ICD-10-CM | POA: Diagnosis not present

## 2013-11-14 DIAGNOSIS — G4700 Insomnia, unspecified: Secondary | ICD-10-CM | POA: Diagnosis not present

## 2013-11-14 DIAGNOSIS — E119 Type 2 diabetes mellitus without complications: Secondary | ICD-10-CM | POA: Diagnosis not present

## 2013-11-14 DIAGNOSIS — F411 Generalized anxiety disorder: Secondary | ICD-10-CM | POA: Diagnosis not present

## 2013-11-18 DIAGNOSIS — M1710 Unilateral primary osteoarthritis, unspecified knee: Secondary | ICD-10-CM | POA: Diagnosis not present

## 2013-12-26 DIAGNOSIS — E119 Type 2 diabetes mellitus without complications: Secondary | ICD-10-CM | POA: Diagnosis not present

## 2013-12-26 DIAGNOSIS — K2100 Gastro-esophageal reflux disease with esophagitis, without bleeding: Secondary | ICD-10-CM | POA: Diagnosis not present

## 2013-12-26 DIAGNOSIS — I10 Essential (primary) hypertension: Secondary | ICD-10-CM | POA: Diagnosis not present

## 2013-12-26 DIAGNOSIS — E785 Hyperlipidemia, unspecified: Secondary | ICD-10-CM | POA: Diagnosis not present

## 2013-12-30 DIAGNOSIS — M1710 Unilateral primary osteoarthritis, unspecified knee: Secondary | ICD-10-CM | POA: Diagnosis not present

## 2014-01-01 DIAGNOSIS — J301 Allergic rhinitis due to pollen: Secondary | ICD-10-CM | POA: Diagnosis not present

## 2014-01-01 DIAGNOSIS — G479 Sleep disorder, unspecified: Secondary | ICD-10-CM | POA: Diagnosis not present

## 2014-01-21 DIAGNOSIS — Z23 Encounter for immunization: Secondary | ICD-10-CM | POA: Diagnosis not present

## 2014-01-21 DIAGNOSIS — G4700 Insomnia, unspecified: Secondary | ICD-10-CM | POA: Diagnosis not present

## 2014-01-21 DIAGNOSIS — E119 Type 2 diabetes mellitus without complications: Secondary | ICD-10-CM | POA: Diagnosis not present

## 2014-01-21 DIAGNOSIS — F411 Generalized anxiety disorder: Secondary | ICD-10-CM | POA: Diagnosis not present

## 2014-02-03 DIAGNOSIS — E785 Hyperlipidemia, unspecified: Secondary | ICD-10-CM | POA: Diagnosis not present

## 2014-02-03 DIAGNOSIS — R002 Palpitations: Secondary | ICD-10-CM | POA: Insufficient documentation

## 2014-02-03 DIAGNOSIS — I10 Essential (primary) hypertension: Secondary | ICD-10-CM | POA: Diagnosis not present

## 2014-02-03 DIAGNOSIS — I25709 Atherosclerosis of coronary artery bypass graft(s), unspecified, with unspecified angina pectoris: Secondary | ICD-10-CM | POA: Diagnosis not present

## 2014-03-23 DIAGNOSIS — H269 Unspecified cataract: Secondary | ICD-10-CM | POA: Diagnosis not present

## 2014-04-13 DIAGNOSIS — L821 Other seborrheic keratosis: Secondary | ICD-10-CM | POA: Diagnosis not present

## 2014-04-13 DIAGNOSIS — I789 Disease of capillaries, unspecified: Secondary | ICD-10-CM | POA: Diagnosis not present

## 2014-04-13 DIAGNOSIS — L578 Other skin changes due to chronic exposure to nonionizing radiation: Secondary | ICD-10-CM | POA: Diagnosis not present

## 2014-04-13 DIAGNOSIS — D1800 Hemangioma unspecified site: Secondary | ICD-10-CM | POA: Diagnosis not present

## 2014-04-13 DIAGNOSIS — Z1283 Encounter for screening for malignant neoplasm of skin: Secondary | ICD-10-CM | POA: Diagnosis not present

## 2014-04-13 DIAGNOSIS — L720 Epidermal cyst: Secondary | ICD-10-CM | POA: Diagnosis not present

## 2014-04-13 DIAGNOSIS — D229 Melanocytic nevi, unspecified: Secondary | ICD-10-CM | POA: Diagnosis not present

## 2014-04-13 DIAGNOSIS — L814 Other melanin hyperpigmentation: Secondary | ICD-10-CM | POA: Diagnosis not present

## 2014-04-27 DIAGNOSIS — E119 Type 2 diabetes mellitus without complications: Secondary | ICD-10-CM | POA: Diagnosis not present

## 2014-04-27 DIAGNOSIS — F419 Anxiety disorder, unspecified: Secondary | ICD-10-CM | POA: Diagnosis not present

## 2014-04-27 DIAGNOSIS — G4700 Insomnia, unspecified: Secondary | ICD-10-CM | POA: Diagnosis not present

## 2014-04-29 DIAGNOSIS — R351 Nocturia: Secondary | ICD-10-CM | POA: Diagnosis not present

## 2014-05-05 DIAGNOSIS — I10 Essential (primary) hypertension: Secondary | ICD-10-CM | POA: Diagnosis not present

## 2014-05-05 DIAGNOSIS — E784 Other hyperlipidemia: Secondary | ICD-10-CM | POA: Diagnosis not present

## 2014-05-05 DIAGNOSIS — K210 Gastro-esophageal reflux disease with esophagitis: Secondary | ICD-10-CM | POA: Diagnosis not present

## 2014-05-26 DIAGNOSIS — N529 Male erectile dysfunction, unspecified: Secondary | ICD-10-CM | POA: Diagnosis not present

## 2014-06-01 ENCOUNTER — Ambulatory Visit: Payer: Self-pay | Admitting: Urology

## 2014-06-01 DIAGNOSIS — N529 Male erectile dysfunction, unspecified: Secondary | ICD-10-CM | POA: Diagnosis not present

## 2014-06-01 DIAGNOSIS — Z01810 Encounter for preprocedural cardiovascular examination: Secondary | ICD-10-CM | POA: Diagnosis not present

## 2014-06-08 ENCOUNTER — Ambulatory Visit: Payer: Self-pay | Admitting: Urology

## 2014-06-08 DIAGNOSIS — Z87891 Personal history of nicotine dependence: Secondary | ICD-10-CM | POA: Diagnosis not present

## 2014-06-08 DIAGNOSIS — F329 Major depressive disorder, single episode, unspecified: Secondary | ICD-10-CM | POA: Diagnosis not present

## 2014-06-08 DIAGNOSIS — I2510 Atherosclerotic heart disease of native coronary artery without angina pectoris: Secondary | ICD-10-CM | POA: Diagnosis not present

## 2014-06-08 DIAGNOSIS — I10 Essential (primary) hypertension: Secondary | ICD-10-CM | POA: Diagnosis not present

## 2014-06-08 DIAGNOSIS — N529 Male erectile dysfunction, unspecified: Secondary | ICD-10-CM | POA: Diagnosis not present

## 2014-06-08 DIAGNOSIS — K219 Gastro-esophageal reflux disease without esophagitis: Secondary | ICD-10-CM | POA: Diagnosis not present

## 2014-06-08 DIAGNOSIS — Z79899 Other long term (current) drug therapy: Secondary | ICD-10-CM | POA: Diagnosis not present

## 2014-06-08 DIAGNOSIS — E119 Type 2 diabetes mellitus without complications: Secondary | ICD-10-CM | POA: Diagnosis not present

## 2014-06-08 DIAGNOSIS — E785 Hyperlipidemia, unspecified: Secondary | ICD-10-CM | POA: Diagnosis not present

## 2014-06-08 DIAGNOSIS — N521 Erectile dysfunction due to diseases classified elsewhere: Secondary | ICD-10-CM | POA: Diagnosis not present

## 2014-06-08 DIAGNOSIS — Z951 Presence of aortocoronary bypass graft: Secondary | ICD-10-CM | POA: Diagnosis not present

## 2014-06-08 DIAGNOSIS — Z7982 Long term (current) use of aspirin: Secondary | ICD-10-CM | POA: Diagnosis not present

## 2014-06-08 DIAGNOSIS — Z87442 Personal history of urinary calculi: Secondary | ICD-10-CM | POA: Diagnosis not present

## 2014-06-08 DIAGNOSIS — F411 Generalized anxiety disorder: Secondary | ICD-10-CM | POA: Diagnosis not present

## 2014-06-08 DIAGNOSIS — Z86010 Personal history of colonic polyps: Secondary | ICD-10-CM | POA: Diagnosis not present

## 2014-06-08 DIAGNOSIS — G4700 Insomnia, unspecified: Secondary | ICD-10-CM | POA: Diagnosis not present

## 2014-06-09 DIAGNOSIS — N521 Erectile dysfunction due to diseases classified elsewhere: Secondary | ICD-10-CM | POA: Diagnosis not present

## 2014-06-09 DIAGNOSIS — I10 Essential (primary) hypertension: Secondary | ICD-10-CM | POA: Diagnosis not present

## 2014-06-09 DIAGNOSIS — E785 Hyperlipidemia, unspecified: Secondary | ICD-10-CM | POA: Diagnosis not present

## 2014-06-09 DIAGNOSIS — E119 Type 2 diabetes mellitus without complications: Secondary | ICD-10-CM | POA: Diagnosis not present

## 2014-06-09 DIAGNOSIS — I2510 Atherosclerotic heart disease of native coronary artery without angina pectoris: Secondary | ICD-10-CM | POA: Diagnosis not present

## 2014-06-09 DIAGNOSIS — F411 Generalized anxiety disorder: Secondary | ICD-10-CM | POA: Diagnosis not present

## 2014-07-06 DIAGNOSIS — F419 Anxiety disorder, unspecified: Secondary | ICD-10-CM | POA: Diagnosis not present

## 2014-07-06 DIAGNOSIS — E119 Type 2 diabetes mellitus without complications: Secondary | ICD-10-CM | POA: Diagnosis not present

## 2014-07-06 DIAGNOSIS — G4700 Insomnia, unspecified: Secondary | ICD-10-CM | POA: Diagnosis not present

## 2014-07-14 DIAGNOSIS — R37 Sexual dysfunction, unspecified: Secondary | ICD-10-CM | POA: Diagnosis not present

## 2014-08-11 DIAGNOSIS — E119 Type 2 diabetes mellitus without complications: Secondary | ICD-10-CM | POA: Diagnosis not present

## 2014-08-11 DIAGNOSIS — E785 Hyperlipidemia, unspecified: Secondary | ICD-10-CM | POA: Diagnosis not present

## 2014-08-11 DIAGNOSIS — I10 Essential (primary) hypertension: Secondary | ICD-10-CM | POA: Diagnosis not present

## 2014-08-11 NOTE — Op Note (Signed)
PATIENT NAME:  Seth Mcintosh, Seth Mcintosh MR#:  [id number] DATE OF BIRTH:  February 17, 1941  DATE OF PROCEDURE:  12/13/2011  PREOPERATIVE DIAGNOSIS: Idiopathic overactive bladder.   POSTOPERATIVE DIAGNOSIS: Idiopathic overactive bladder.  PROCEDURE: Cystoscopy with intravesical Botox injection.   SURGEON: Leory Giovanni, M.D.   ASSISTANT: None.   ANESTHESIA: General.   INDICATIONS: A 74 year old male with moderate to severe lower urinary tract symptoms, primarily urinary frequency and urgency. He has failed multiple medications including alpha blockers, several anticholinergics, and Myrbetriq. He has elected to proceed with intravesical Botox injection.   DESCRIPTION OF PROCEDURE: The patient was taken to the Operating Room where a general anesthetic was administered. He was placed in the low lithotomy position and his external genitalia were prepped and draped in usual fashion. IV sedation was obtained by anesthesia. A 17 French cystoscope with 30 degree lens was lubricated and passed under direct vision. The urethra was normal in caliber without strictures. The prostate was remarkable for TUR changes with some adenoma regrowth. The bladder mucosa was closely inspected and no erythema, solid or papillary lesions were identified. The ureteral orifices were normal appearing with clear efflux. 100 units of Botox was reconstituted in 10 mL. A 4 mm transcystoscopic needle was placed through the working channel. Starting approximately 1 cm above the trigone a row of injections were placed approximately 1 cm apart at five injections per row. 0.5 mL of Botox was injected at each site. Additional injections were done on the posterior wall with a total of 20       injections to cover the posterior wall. No significant bleeding was noted. The bladder was emptied and cystoscope was removed. To patient was taken to the PAC-U in stable condition. There were no complications. EBL minimal.  ____________________________ Ronda Fairly. Bernardo Heater, MD scs:slb D: 12/13/2011 14:59:47 ET T: 12/13/2011 15:37:46 ET JOB#: [id number]  cc: Nicki Reaper C. Bernardo Heater, MD, <Dictator> Abbie Sons MD ELECTRONICALLY SIGNED 12/18/2011 21:15

## 2014-08-12 DIAGNOSIS — E782 Mixed hyperlipidemia: Secondary | ICD-10-CM | POA: Insufficient documentation

## 2014-08-12 DIAGNOSIS — I10 Essential (primary) hypertension: Secondary | ICD-10-CM | POA: Insufficient documentation

## 2014-08-21 DIAGNOSIS — E782 Mixed hyperlipidemia: Secondary | ICD-10-CM | POA: Diagnosis not present

## 2014-08-21 DIAGNOSIS — I25810 Atherosclerosis of coronary artery bypass graft(s) without angina pectoris: Secondary | ICD-10-CM | POA: Diagnosis not present

## 2014-08-21 DIAGNOSIS — I10 Essential (primary) hypertension: Secondary | ICD-10-CM | POA: Diagnosis not present

## 2014-08-23 NOTE — Discharge Summary (Signed)
PATIENT NAME:  Seth Mcintosh, Seth Mcintosh MR#:  [id number] DATE OF BIRTH:  [birth year]  DATE OF ADMISSION:  06/08/2014 DATE OF DISCHARGE:  06/09/2014  HISTORY AND HOSPITAL COURSE: The patient is doing well, postop day number 1 after placement of inflatable penile prosthesis. Drain is removed this morning. He had 150 mL of drainage last night. Scrotum shows no scrotal edema, swelling. Pump is easily pulled down into position. The patient is taught to pull the pump down daily every time he voids. His only complaint today is that he is constipated. He has milk of mag at home. He will use that on discharge and is being discharged this morning. He is not going to have any further medications today. Postoperatively he was given gentamicin 80 mg. This was done for 2 doses. He will be discharged on Ceftin 250 mg b.i.d. and pain meds. His followup is in 1 week for recheck and deflation of the single pump inflation that is in his cylinders. He has been eating and drinking well, has voided and vital signs are stable. Abdomen is soft. Incision is clean and dry. He is encouraged to not do any heavy lifting and limited activity until he sees [place].   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   FINAL DISCHARGE CONDITION: Satisfactory.   ____________________________ Janice Coffin. Elnoria Howard, DO rdh:sb D: 06/09/2014 07:52:56 ET T: 06/09/2014 08:51:25 ET JOB#: [id number]  cc: Janice Coffin. Elnoria Howard, DO, <Dictator> Huberta Tompkins D Keisuke Hollabaugh DO ELECTRONICALLY SIGNED 06/11/2014 12:47

## 2014-08-23 NOTE — Op Note (Signed)
PATIENT NAME:  Seth Mcintosh, Seth Mcintosh MR#:  [id number] DATE OF BIRTH:  12/25/1940  DATE OF PROCEDURE:  06/08/2014  PREOPERATIVE DIAGNOSIS: Vascular impotence.   POSTOPERATIVE DIAGNOSIS: Vascular impotence.  PROCEDURE: Insertion of inflatable penile prosthesis multi-component.   ANESTHESIA: General.   DESCRIPTION OF PROCEDURE: With the patient sterilely prepped and draped and in a supine position with the table slightly flexed, procedure was begun after an appropriate timeout. Infrapubic incision was made 1 inch above the penile abdominal angle and carried down to both corpora. I am careful to leave the vascular nerve bundle in the midline completely intact. Then, with this infrapubic horizontal incision carried down to the corpora, the corpora on the patient's left side is exposed first and with a UR -6 needle I placed 2 stay sutures laterally in this portion of the corpora and then make an incision in the corpora with a 12 French blade. This is repeated on the opposite side. Curved hemostats were used on the right, straight hemostats on the left. Then the measurements are made with a Furlow measuring device. Then, two 18 cm prostheses with 2 cm rear tipped extenders are then readied for insertion. While I am waiting for these to be readied, I placed the reservoir through the internal ring into a space that I created using a 3 inch pediatric nasal speculum. This creates a space, I put the 120 mL into the reservoir and leave it in place with the tubing shotted. I then take the readied cylinders and place the cylinders into each corpora through the corporotomies. I use an inserting device with the [facility] needle. I am careful to bend the glans forward so that I do not penetrate through or put pressure on the distal corporal end. The [place] needle is pulled through the glans. Then it is hemostatted. This is repeated on the opposite side. I am very careful to move the penis in its natural direction so that I stay very  laterally with both of these insertions. Then I place the proximal and easily into the corpora to be seated proximally. Both sides go fairly easily. On the right side, I have to use Metzenbaum scissors dissection as there is a shelf in the way. It opens right up. No problem, the cylinders are in good position. Corporotomies are closed with the preplaced corporotomy suture and no extra suturing is done; it is small enough that I can do that. So, then the pump is placed down into the scrotum and then through the pump I test the cylinders. Cylinders are in good position in each corpora with a good erection achieved without any bending or crossover of either of the cylinders. The cylinders are seated nicely in the glans. There are no deformities. He has a slight torsion of the penis, but it is not a problem that I have to take care of. I have seated the pump down in the left hemiscrotum, it is in a good subdartos pouch. I use the nasal speculum for this and have no trouble doing this. Then, I use quick connects to connect the reservoir. Before I connect the reservoir, I take 60 mL out of it; I have already placed in 120, take 60 out, so there is 60 in the reservoir which is more than enough. Because I put 120 in, I have a bigger space through in the transversalis space, and so I am able to put that deep in the transversalis space, so that there is none of it protruding out through  the external ring. Once the connections are made, we retest the prosthesis. The prosthesis still operates well, so I am able to then close the infrapubic incision. A Jackson-Pratt drain is preplaced into the scrotum next to the pump and 10 pounds of sand bags will be placed on this incision. The Jackson-Pratt is placed laterally on the left; sutured in place with 0 Ethibond. The subcutaneous space is closed with a running 3-0 Vicryl, and the skin is closed with a running 3-0 Vicryl subcuticular suture. Sterile dressings are placed. Sent to  recovery in satisfactory condition with 10 pounds to be placed on the incision.   ____________________________ Janice Coffin. Elnoria Howard, DO rdh:TM D: 06/08/2014 14:41:58 ET T: 06/08/2014 15:39:02 ET JOB#: [id number]  cc: Janice Coffin. Elnoria Howard, DO, <Dictator> Shataria Crist D Nyaira Hodgens DO ELECTRONICALLY SIGNED 06/11/2014 12:47

## 2014-09-09 DIAGNOSIS — M1712 Unilateral primary osteoarthritis, left knee: Secondary | ICD-10-CM | POA: Diagnosis not present

## 2014-10-23 ENCOUNTER — Telehealth: Payer: Self-pay | Admitting: Family Medicine

## 2014-10-23 NOTE — Telephone Encounter (Signed)
Pt needs a refill on about 5 different meds. Said that [employer] has sent over a request about 2 times and have yet to hear anything back. Their number is [phone].

## 2014-10-23 NOTE — Telephone Encounter (Signed)
Patient has an appt with Dr Manuella Ghazi on 11-11-14.  I talked to Dr Manuella Ghazi on how he would like to handle this.  Michela Pitcher he would prefer to do 30 day local script until after patient seen.  Patient was a former Dr Jacqualine Code patient.  The requests from [employer] were being sent to the old fax number.  I called [employer] to see if I could give them his new Dr's name and fax information.  I was told the patient would have to do this, along with all of the med prescription details.  I called the patient and told him DR Trena Platt preference, to which he said that he didn't want to do this because it would cost more.  I told him that at any rate [employer] will require him to contact them to make any changes to his profile to update new Dr. Katherine Basset.   At that time he said that he would "get by" ,he said that he was looking for another pcp anyway.

## 2014-10-26 ENCOUNTER — Telehealth: Payer: Self-pay | Admitting: *Deleted

## 2014-10-28 ENCOUNTER — Encounter: Payer: Self-pay | Admitting: Urology

## 2014-10-28 ENCOUNTER — Ambulatory Visit (INDEPENDENT_AMBULATORY_CARE_PROVIDER_SITE_OTHER): Payer: Medicare Other | Admitting: Urology

## 2014-10-28 VITALS — BP 127/74 | HR 64 | Resp 18 | Ht 74.5 in | Wt 275.7 lb

## 2014-10-28 DIAGNOSIS — N3281 Overactive bladder: Secondary | ICD-10-CM | POA: Diagnosis not present

## 2014-10-28 DIAGNOSIS — R37 Sexual dysfunction, unspecified: Secondary | ICD-10-CM

## 2014-10-28 DIAGNOSIS — I2510 Atherosclerotic heart disease of native coronary artery without angina pectoris: Secondary | ICD-10-CM | POA: Insufficient documentation

## 2014-10-28 NOTE — Progress Notes (Signed)
10/28/2014 5:28 PM   Seth [place] Sr. [birth year] [id number]  Referring provider: No referring provider defined for this encounter.  Chief Complaint  Patient presents with  . Erectile Dysfunction  . Follow-up    HPI: Patient approximately 16 months status post penile prosthesis. He is a penile prosthesis well but is unable to have orgasm. Penile prosthesis was checked and it deflates and inflates well. He relates the orgasm decreased to the temporally to the time he had his prostate penile prosthesis put in. This is not related. The orgasm and the erection to do from mechanisms 1 mechanical 1 Central nervous system. I will testing for him to him testosterone because I think at his age he's lost some of his hormonal levels. He is also a secondary complaint of OAB. Insurance would not pay for his medications. We will to the posterior tibial nerve stimulation. He has only tried InterStim once and did not like it. Otherwise, he has a good stream but he has urgency and frequency and sometimes a loss of urine if he does not get to the bathroom on time. This was corrected with medication but he will cannot afford to have the medication attention so we will try to take a different approach 4 him   PMH: Past Medical History  Diagnosis Date  . Coronary artery disease   . Hypertension   . Arthritis     rheumatoid   . GERD (gastroesophageal reflux disease)   . Urgency of urination   . Diabetes mellitus without complication   . Sleeping difficulty     takes med nightly  . Depression   . Localized osteoarthritis of right knee 05/21/2012  . Reflux     Surgical History: Past Surgical History  Procedure Laterality Date  . Foot surgery      x2  . Back surgery    . Coronary artery bypass graft  2004    x4 vessels  . Foot surgery      x2  . Transurethral resection of prostate    . Total knee arthroplasty  05/21/2012    Procedure: TOTAL KNEE ARTHROPLASTY;  Surgeon: Johnny Bridge, MD;   Location: [facility];  Service: Orthopedics;  Laterality: Right;    Home Medications:    Medication List       This list is accurate as of: 10/28/14  5:28 PM.  Always use your most recent med list.               aspirin EC 81 MG tablet  Take 81 mg by mouth daily.     buPROPion 300 MG 24 hr tablet  Commonly known as:  WELLBUTRIN XL  Take 300 mg by mouth daily before breakfast.     busPIRone 15 MG tablet  Commonly known as:  BUSPAR  Take 15 mg by mouth daily before breakfast.     CALCI-MAX PO  Take 1 tablet by mouth daily.     clonazePAM 0.5 MG tablet  Commonly known as:  KLONOPIN  Take 0.5 mg by mouth 2 (two) times daily as needed. anxiety     Co Q 10 100 MG Caps  Take 1 capsule by mouth daily.     eszopiclone 3 MG Tabs  Generic drug:  Eszopiclone  Take 3 mg by mouth at bedtime. Take immediately before bedtime     ezetimibe-simvastatin 10-20 MG per tablet  Commonly known as:  VYTORIN  Take 1 tablet by mouth daily before breakfast.  fish oil-omega-3 fatty acids 1000 MG capsule  Take 2 g by mouth daily.     fluticasone 50 MCG/ACT nasal spray  Commonly known as:  FLONASE  Place 2 sprays into the nose daily.     irbesartan 150 MG tablet  Commonly known as:  AVAPRO  Take 150 mg by mouth daily before breakfast.     metFORMIN 500 MG tablet  Commonly known as:  GLUCOPHAGE  Take 500 mg by mouth 2 (two) times daily with a meal.     multivitamin with minerals Tabs tablet  Take 1 tablet by mouth daily.     pantoprazole 40 MG tablet  Commonly known as:  PROTONIX  Take 40 mg by mouth daily.     vitamin C 1000 MG tablet  Take 2,000 mg by mouth daily.     Vitamin D 2000 UNITS tablet  Take 2,000 Units by mouth daily.        Allergies: No Known Allergies  Family History: No family history on file.  Social History:  reports that he quit smoking about 22 years ago. He does not have any smokeless tobacco history on file. He reports that he drinks alcohol. He  reports that he does not use illicit drugs.  NLG:XQJJHER Frequent Urination?: No Hard to postpone urination?: No Burning/pain with urination?: No Get up at night to urinate?: No Leakage of urine?: No Urine stream starts and stops?: No Trouble starting stream?: No Do you have to strain to urinate?: No Blood in urine?: No Urinary tract infection?: No Sexually transmitted disease?: No Injury to kidneys or bladder?: No Painful intercourse?: No Weak stream?: No Erection problems?: No Penile pain?: No Gastrointestinal Nausea?: No Vomiting?: No Indigestion/heartburn?: No Diarrhea?: No Constipation?: No Constitutional Fever: No Night sweats?: No Weight loss?: No Fatigue?: No Skin Skin rash/lesions?: No Itching?: No Eyes Blurred vision?: No Double vision?: No Ears/Nose/Throat Sore throat?: No Sinus problems?: No Hematologic/Lymphatic Swollen glands?: No Easy bruising?: No Cardiovascular Leg swelling?: No Chest pain?: No Respiratory Cough?: No Shortness of breath?: No Endocrine Excessive thirst?: No Musculoskeletal Back pain?: No Joint pain?: No Neurological Headaches?: No Dizziness?: No Psychologic Depression?: No Anxiety?: No       Physical Exam: BP 127/74 mmHg  Pulse 64  Resp 18  Ht 6' 2.5" (1.892 m)  Wt 275 lb 11.2 oz (125.057 kg)  BMI 34.94 kg/m2  Constitutional:  Alert and oriented, No acute distress. HEENT: [place] AT, moist mucus membranes.  Trachea midline, no masses. Cardiovascular: No clubbing, cyanosis, or edema. Respiratory: Normal respiratory effort, no increased work of breathing. GI: Abdomen is soft, nontender, nondistended, no abdominal masses GU: No CVA tenderness. Well functioning penile prosthesis circumcised testes and penis normal  Skin: No rashes, bruises or suspicious lesions. Lymph: No cervical or inguinal adenopathy. Neurologic: Grossly intact, no focal deficits, moving all 4 extremities. Psychiatric: Normal mood and  affect.  Laboratory Data: Lab Results  Component Value Date   WBC 10.1 05/24/2012   HGB 13.3 05/24/2012   HCT 37.8* 05/24/2012   MCV 92.0 05/24/2012   PLT 214 05/24/2012    Lab Results  Component Value Date   CREATININE 1.08 05/22/2012    No results found for: PSA  No results found for: TESTOSTERONE  No results found for: HGBA1C  Urinalysis    Component Value Date/Time   COLORURINE YELLOW 05/14/2012 [place] 05/14/2012 1116   LABSPEC 1.013 05/14/2012 1116   PHURINE 6.0 05/14/2012 1116   [place] 05/14/2012 1116  HGBUR NEGATIVE 05/14/2012 [place] 05/14/2012 [place] 05/14/2012 1116   PROTEINUR NEGATIVE 05/14/2012 1116   UROBILINOGEN 1.0 05/14/2012 1116   NITRITE NEGATIVE 05/14/2012 1116   LEUKOCYTESUR NEGATIVE 05/14/2012 1116    Pertinent Imaging: none  Assessment & Plan:  Patient having difficulty with orgasm but good function of his. I'll prosthesis which is an inflatable penile prosthesis. Bumps easily and deflates easily. Plan to put patient on a possibility of some hormone replacement obtained 27 testosterone. Secondary complaint of continued incontinence. I will put patient on a regular regimen of posterior tibial nerve stimulation  1. Sexual dysfunction 2. Overactive bladder. - Urinalysis, Complete - Testosterone   Return in about 2 years (around 10/27/2016) for start PTNS.  Collier Flowers, [facility] [address] [place], [place] [address] [phone]

## 2014-10-29 ENCOUNTER — Ambulatory Visit: Payer: Self-pay | Admitting: Urology

## 2014-10-29 LAB — URINALYSIS, COMPLETE
BILIRUBIN UA: NEGATIVE
GLUCOSE, UA: NEGATIVE
Nitrite, UA: NEGATIVE
PH UA: 5 (ref 5.0–7.5)
PROTEIN UA: NEGATIVE
RBC UA: NEGATIVE
Specific Gravity, UA: 1.025 (ref 1.005–1.030)
Urobilinogen, Ur: 1 mg/dL (ref 0.2–1.0)

## 2014-10-29 LAB — MICROSCOPIC EXAMINATION: Bacteria, UA: NONE SEEN

## 2014-11-02 NOTE — Telephone Encounter (Signed)
None Needed

## 2014-11-03 ENCOUNTER — Other Ambulatory Visit: Payer: Medicare Other

## 2014-11-03 DIAGNOSIS — N529 Male erectile dysfunction, unspecified: Secondary | ICD-10-CM | POA: Diagnosis not present

## 2014-11-05 LAB — TESTOSTERONE: Testosterone: 409 ng/dL (ref 348–1197)

## 2014-11-11 ENCOUNTER — Ambulatory Visit (INDEPENDENT_AMBULATORY_CARE_PROVIDER_SITE_OTHER): Payer: Medicare Other | Admitting: Urology

## 2014-11-11 ENCOUNTER — Encounter: Payer: Self-pay | Admitting: Urology

## 2014-11-11 ENCOUNTER — Ambulatory Visit (INDEPENDENT_AMBULATORY_CARE_PROVIDER_SITE_OTHER): Payer: Medicare Other | Admitting: Family Medicine

## 2014-11-11 ENCOUNTER — Encounter: Payer: Self-pay | Admitting: Family Medicine

## 2014-11-11 VITALS — BP 138/79 | HR 65 | Ht 74.5 in | Wt 274.4 lb

## 2014-11-11 VITALS — BP 128/77 | HR 97 | Temp 98.0°F | Resp 19 | Ht 75.0 in | Wt 275.2 lb

## 2014-11-11 DIAGNOSIS — R3915 Urgency of urination: Secondary | ICD-10-CM | POA: Diagnosis not present

## 2014-11-11 DIAGNOSIS — E785 Hyperlipidemia, unspecified: Secondary | ICD-10-CM | POA: Diagnosis not present

## 2014-11-11 DIAGNOSIS — IMO0001 Reserved for inherently not codable concepts without codable children: Secondary | ICD-10-CM | POA: Insufficient documentation

## 2014-11-11 DIAGNOSIS — F411 Generalized anxiety disorder: Secondary | ICD-10-CM | POA: Diagnosis not present

## 2014-11-11 DIAGNOSIS — K2100 Gastro-esophageal reflux disease with esophagitis, without bleeding: Secondary | ICD-10-CM

## 2014-11-11 DIAGNOSIS — F5104 Psychophysiologic insomnia: Secondary | ICD-10-CM | POA: Insufficient documentation

## 2014-11-11 DIAGNOSIS — K219 Gastro-esophageal reflux disease without esophagitis: Secondary | ICD-10-CM | POA: Insufficient documentation

## 2014-11-11 DIAGNOSIS — G4700 Insomnia, unspecified: Secondary | ICD-10-CM

## 2014-11-11 DIAGNOSIS — I10 Essential (primary) hypertension: Secondary | ICD-10-CM

## 2014-11-11 DIAGNOSIS — E119 Type 2 diabetes mellitus without complications: Secondary | ICD-10-CM | POA: Diagnosis not present

## 2014-11-11 DIAGNOSIS — F419 Anxiety disorder, unspecified: Secondary | ICD-10-CM | POA: Insufficient documentation

## 2014-11-11 LAB — PTNS-PERCUTANEOUS TIBIAL NERVE STIMULATION: Scan Result: 12

## 2014-11-11 MED ORDER — EZETIMIBE-SIMVASTATIN 10-20 MG PO TABS: 1.0000 | ORAL_TABLET | Freq: Every day | ORAL | Status: DC

## 2014-11-11 MED ORDER — CLONAZEPAM 0.5 MG PO TABS: 0.5000 mg | ORAL_TABLET | Freq: Two times a day (BID) | ORAL | Status: DC | PRN

## 2014-11-11 MED ORDER — IRBESARTAN 150 MG PO TABS: 150.0000 mg | ORAL_TABLET | Freq: Every day | ORAL | Status: DC

## 2014-11-11 MED ORDER — BUSPIRONE HCL 15 MG PO TABS: 15.0000 mg | ORAL_TABLET | Freq: Two times a day (BID) | ORAL | Status: DC

## 2014-11-11 MED ORDER — SUVOREXANT 10 MG PO TABS: 1.0000 | ORAL_TABLET | Freq: Every evening | ORAL | Status: DC | PRN

## 2014-11-11 MED ORDER — METFORMIN HCL 1000 MG PO TABS: 1000.0000 mg | ORAL_TABLET | Freq: Two times a day (BID) | ORAL | Status: DC

## 2014-11-11 MED ORDER — BUPROPION HCL ER (XL) 300 MG PO TB24: 300.0000 mg | ORAL_TABLET | Freq: Every day | ORAL | Status: DC

## 2014-11-11 MED ORDER — PANTOPRAZOLE SODIUM 40 MG PO TBEC: 40.0000 mg | DELAYED_RELEASE_TABLET | Freq: Every day | ORAL | Status: DC

## 2014-11-11 NOTE — Progress Notes (Signed)
Chief Complaint:  Chief Complaint  Patient presents with  . Urinary Urgency    PTNS     HPI: Seth Mcintosh is a 74 year old white male with a long-standing history of urinary frequency, nocturia and urge incontinence. He does not recall specific event that led to the urinary symptoms.  His amount of leakage is variable. He wears depends when he goes on long car trips. He will have an occasional leakage when he is not driving, but it is a scant amount of urine.  He does not lose urine if he coughs, sneezes or laughs. He also does not lose urine if he changing position or bending or lifting.        He states he is having 10 trips to the bathroom during the day to void. He is getting up 10 times a night to void as well. He is not experiencing any pain or burning with urination. He has a strong urinary urgency. He is experiencing 2 incontinent episodes daily.     He is consuming 4 caffeinated beverages daily, occasional alcohol consumption and does not drink copious amount of water during the day.      Previous Therapy:     Patient had been evaluated and treated through an urologist in [place]. We have requested those records but have not yet received them at this time.  With our office he had a trial of Myrbetriq, which he failed.  He was given a trial of Vesicare 10 mg daily. He found a great relief with this medication, but his insurance would not cover the medication.      BP 138/79 mmHg  Pulse 65  Ht 6' 2.5" (1.892 m)  Wt 274 lb 6.4 oz (124.467 kg)  BMI 34.77 kg/m2  He does not have any contraindications present for PTNS, such as: Pacemaker, Implantable defibrillator, History of abnormal bleeding or History of neuropathies or nerve damage.  Discussed with patient possible complications of procedure, such as discomfort, bleeding at insertion/stimulation site, procedure consent signed  Patient goals:     His goals are to reduce urinary urgency and frequency.  PTNS treatment:    The  needle electrode was inserted into the lower, inner aspect of the patient's left leg. The surface electrode was placed on the inside arch of the foot on the treatment leg. The lead set was connected to the stimulator, and the needle electrode clip was connected to the needle electrode. The stimulator that produces an adjustable electrical pulse that travels to the sacral nerve plexus via the tibial nerve was increased to 12 and tell the patient received both a toe flex and sensory response.  Treatment Plan:     Patient tolerated the PTNS treatment well for 30 minutes. The electrode was removed with out difficulty. He will return in 1 week for his second PTNS treatment

## 2014-11-11 NOTE — Progress Notes (Signed)
Name: Seth GAHM Sr.   MRN: [id number]    DOB: 05/27/1940   Date:11/11/2014       Progress Note  Subjective  Chief Complaint  Chief Complaint  Patient presents with  . Follow-up    Fasting/Labs/patient wants to talk about changing sleep medication   . Hypertension  . Diabetes  . Hyperlipidemia    Hypertension This is a chronic problem. The problem is controlled. Associated symptoms include anxiety. Pertinent negatives include no chest pain, headaches, palpitations or shortness of breath. Past treatments include angiotensin blockers. Hypertensive end-organ damage includes CAD/MI. There is no history of kidney disease or CVA.  Diabetes He has type 2 diabetes mellitus. His disease course has been stable. Hypoglycemia symptoms include nervousness/anxiousness. Pertinent negatives for hypoglycemia include no headaches. Associated symptoms include polyuria and weakness. Pertinent negatives for diabetes include no chest pain, no foot paresthesias and no polydipsia. Pertinent negatives for diabetic complications include no CVA. Current diabetic treatment includes oral agent (monotherapy). He is following a diabetic and generally healthy diet. An ACE inhibitor/angiotensin II receptor blocker is being taken.  Hyperlipidemia This is a chronic problem. The problem is controlled. Recent lipid tests were reviewed and are normal. Pertinent negatives include no chest pain, leg pain, myalgias or shortness of breath. Current antihyperlipidemic treatment includes statins and ezetimibe. The current treatment provides significant improvement of lipids.  Anxiety Presents for follow-up visit. Symptoms include excessive worry, insomnia, irritability and nervous/anxious behavior. Patient reports no chest pain, palpitations, panic or shortness of breath. The quality of sleep is poor. Nighttime awakenings: several.   There are no known risk factors. Past treatments include benzodiazephines and non-SSRI  antidepressants. Compliance with prior treatments has been good.    Past Medical History  Diagnosis Date  . Coronary artery disease   . Hypertension   . Arthritis     rheumatoid   . GERD (gastroesophageal reflux disease)   . Urgency of urination   . Diabetes mellitus without complication   . Sleeping difficulty     takes med nightly  . Depression   . Localized osteoarthritis of right knee 05/21/2012  . Reflux     Past Surgical History  Procedure Laterality Date  . Foot surgery      x2  . Back surgery    . Coronary artery bypass graft  2004    x4 vessels  . Foot surgery      x2  . Transurethral resection of prostate    . 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 Family history unknown: Yes    History   Social History  . Marital Status: Widowed    Spouse Name: N/A  . Number of Children: N/A  . Years of Education: N/A   Occupational History  . Not on file.   Social History Main Topics  . Smoking status: Former Smoker    Quit date: 05/14/1992  . Smokeless tobacco: Not on file  . Alcohol Use: Yes     Comment: moderatly  . Drug Use: No  . Sexual Activity: Not on file   Other Topics Concern  . Not on file   Social History Narrative     Current outpatient prescriptions:  .  Ascorbic Acid (VITAMIN C) 1000 MG tablet, Take 2,000 mg by mouth daily., Disp: , Rfl:  .  aspirin EC 81 MG tablet, Take 81 mg by mouth daily., Disp: , Rfl:  .  buPROPion (WELLBUTRIN XL) 300 MG 24 hr tablet, Take 300 mg by mouth daily before breakfast., Disp: , Rfl:  .  busPIRone (BUSPAR) 15 MG tablet, Take 15 mg by mouth daily before breakfast., Disp: , Rfl:  .  Cholecalciferol (VITAMIN D) 2000 UNITS tablet, Take 2,000 Units by mouth daily., Disp: , Rfl:  .  clonazePAM (KLONOPIN) 0.5 MG tablet, Take 0.5 mg by  mouth 2 (two) times daily as needed. anxiety, Disp: , Rfl:  .  Coenzyme Q10 (CO Q 10) 100 MG CAPS, Take 1 capsule by mouth daily., Disp: , Rfl:  .  Eszopiclone (ESZOPICLONE) 3 MG TABS, Take 3 mg by mouth at bedtime. Take immediately before bedtime, Disp: , Rfl:  .  ezetimibe-simvastatin (VYTORIN) 10-20 MG per tablet, Take 1 tablet by mouth daily before breakfast., Disp: , Rfl:  .  fish oil-omega-3 fatty acids 1000 MG capsule, Take 2 g by mouth daily., Disp: , Rfl:  .  fluticasone (FLONASE) 50 MCG/ACT nasal spray, Place 2 sprays into the nose daily., Disp: , Rfl:  .  irbesartan (AVAPRO) 150 MG tablet, Take 150 mg by mouth daily before breakfast., Disp: , Rfl:  .  metFORMIN (GLUCOPHAGE) 500 MG tablet, Take 500 mg by mouth 2 (two) times daily with a meal., Disp: , Rfl:  .  Multiple Vitamin (MULTIVITAMIN WITH MINERALS) TABS, Take 1 tablet by mouth daily., Disp: , Rfl:  .  Multiple Vitamins-Calcium (CALCI-MAX PO), Take 1 tablet by mouth daily., Disp: , Rfl:  .  pantoprazole (PROTONIX) 40 MG tablet, Take 40 mg by mouth daily., Disp: , Rfl:   No Known Allergies   Review of Systems  Constitutional: Positive for irritability.  Respiratory: Negative for shortness of breath.   Cardiovascular: Negative for chest pain and palpitations.  Musculoskeletal: Negative for myalgias.  Neurological: Positive for weakness. Negative for headaches.  Endo/Heme/Allergies: Negative for polydipsia.  Psychiatric/Behavioral: The patient is nervous/anxious and has insomnia.     Objective  Filed Vitals:   11/11/14 0926  BP: 128/77  Pulse: 97  Temp: 98 F (36.7 C)  TempSrc: Oral  Resp: 19  Height: 6\' 3"  (1.905 m)  Weight: 275 lb 3.2 oz (124.83 kg)  SpO2: 94%    Physical Exam  Constitutional: He is oriented to person, place, and time and well-developed, well-nourished, and in no distress.  HENT:  Head: Normocephalic and atraumatic.  Cardiovascular: Normal rate and regular rhythm.   Murmur  heard. Pulmonary/Chest: Effort normal and breath sounds normal.  Abdominal: Soft. Bowel sounds are normal.  Neurological: He is alert and oriented to person, place, and time.  Skin: Skin is warm and dry.  Psychiatric: Memory, affect and judgment normal.  Nursing note and vitals reviewed.   Assessment & Plan 1. Benign essential HTN  - irbesartan (AVAPRO) 150 MG tablet; Take 1 tablet (150 mg total) by mouth daily.  Dispense: 90 tablet; Refill: 0  2. Generalized anxiety disorder Symptoms stable on present therapy. Pt. Is taking medication as directed and is aware of dependence potential of benzodiazepines.  - buPROPion (WELLBUTRIN XL) 300 MG 24 hr tablet; Take 1 tablet (300 mg total) by mouth daily.  Dispense: 90 tablet; Refill: 0 - busPIRone (BUSPAR) 15 MG tablet; Take 1 tablet (15 mg total) by mouth 2 (two) times daily.  Dispense: 180 tablet; Refill: 0 - clonazePAM (KLONOPIN) 0.5 MG tablet; Take 1 tablet (0.5 mg total) by mouth  2 (two) times daily as needed. anxiety  Dispense: 60 tablet; Refill: 0  3. Diabetes mellitus type 2, controlled  - metFORMIN (GLUCOPHAGE) 1000 MG tablet; Take 1 tablet (1,000 mg total) by mouth 2 (two) times daily with a meal.  Dispense: 180 tablet; Refill: 0 - HgB A1c  4. Hyperlipidemia  - ezetimibe-simvastatin (VYTORIN) 10-20 MG per tablet; Take 1 tablet by mouth daily at 6 PM.  Dispense: 90 tablet; Refill: 0 - Lipid Profile - Comprehensive Metabolic Panel (CMET)  5. Gastroesophageal reflux disease with esophagitis  - pantoprazole (PROTONIX) 40 MG tablet; Take 1 tablet (40 mg total) by mouth daily.  Dispense: 90 tablet; Refill: 0  6. Chronic insomnia Pt. Will be started on Belsomra for insomnia. Follow up in 1 month. - Suvorexant (BELSOMRA) 10 MG TABS; Take 1 tablet by mouth at bedtime as needed.  Dispense: 30 tablet; Refill: 0   Sonam Wandel Asad A. [facility] 11/11/2014 9:46 AM

## 2014-11-12 LAB — LIPID PANEL
CHOLESTEROL TOTAL: 128 mg/dL (ref 100–199)
Chol/HDL Ratio: 2.2 ratio units (ref 0.0–5.0)
HDL: 59 mg/dL (ref >39)
LDL CALC: 60 mg/dL (ref 0–99)
TRIGLYCERIDES: 47 mg/dL (ref 0–149)
VLDL Cholesterol Cal: 9 mg/dL (ref 5–40)

## 2014-11-12 LAB — COMPREHENSIVE METABOLIC PANEL
A/G RATIO: 2.1 (ref 1.1–2.5)
ALBUMIN: 4.6 g/dL (ref 3.5–4.8)
ALK PHOS: 54 IU/L (ref 39–117)
ALT: 37 IU/L (ref 0–44)
AST: 20 IU/L (ref 0–40)
BILIRUBIN TOTAL: 0.6 mg/dL (ref 0.0–1.2)
BUN / CREAT RATIO: 21 (ref 10–22)
BUN: 23 mg/dL (ref 8–27)
CALCIUM: 9.8 mg/dL (ref 8.6–10.2)
CHLORIDE: 100 mmol/L (ref 97–108)
CO2: 23 mmol/L (ref 18–29)
Creatinine, Ser: 1.1 mg/dL (ref 0.76–1.27)
GFR calc non Af Amer: 66 mL/min/{1.73_m2} (ref >59)
GFR, EST AFRICAN AMERICAN: 77 mL/min/{1.73_m2} (ref >59)
GLUCOSE: 138 mg/dL — AB (ref 65–99)
Globulin, Total: 2.2 g/dL (ref 1.5–4.5)
POTASSIUM: 4.6 mmol/L (ref 3.5–5.2)
Sodium: 140 mmol/L (ref 134–144)
Total Protein: 6.8 g/dL (ref 6.0–8.5)

## 2014-11-12 LAB — HEMOGLOBIN A1C
Est. average glucose Bld gHb Est-mCnc: 146 mg/dL
HEMOGLOBIN A1C: 6.7 % — AB (ref 4.8–5.6)

## 2014-11-18 ENCOUNTER — Ambulatory Visit: Payer: Self-pay | Admitting: Urology

## 2014-11-25 ENCOUNTER — Encounter: Payer: Self-pay | Admitting: Urology

## 2014-11-25 ENCOUNTER — Ambulatory Visit (INDEPENDENT_AMBULATORY_CARE_PROVIDER_SITE_OTHER): Payer: Medicare Other | Admitting: Urology

## 2014-11-25 VITALS — BP 158/79 | HR 71 | Temp 98.5°F | Resp 16 | Ht 74.0 in | Wt 272.7 lb

## 2014-11-25 DIAGNOSIS — R3915 Urgency of urination: Secondary | ICD-10-CM | POA: Diagnosis not present

## 2014-11-25 LAB — PTNS-PERCUTANEOUS TIBIAL NERVE STIMULATION: Scan Result: 14

## 2014-11-25 NOTE — Progress Notes (Signed)
Chief Complaint:  Chief Complaint  Patient presents with  . Urinary Urgency    PTNS     HPI: Seth Mcintosh is a 74 year old white male with a long-standing history of urinary frequency, nocturia and urge incontinence. He does not recall specific event that led to the urinary symptoms.  His amount of leakage is variable. He wears depends when he goes on long car trips. He will have an occasional leakage when he is not driving, but it is a scant amount of urine.  He does not lose urine if he coughs, sneezes or laughs. He also does not lose urine if he changing position or bending or lifting.        He states he is having 5 trips to the bathroom during the day to void. He is getting up 3 times a night to void as well. He is not experiencing any pain or burning with urination. He has a strong urinary urgency. He is experiencing 1 incontinent episodes daily.     He is consuming 4 caffeinated beverages daily, occasional alcohol consumption and does not drink copious amount of water during the day.      Previous Therapy:     Patient had been evaluated and treated through an urologist in [place]. We have requested those records but have not yet received them at this time.  With our office he had a trial of Myrbetriq, which he failed.  He was given a trial of Vesicare 10 mg daily. He found a great relief with this medication, but his insurance would not cover the medication.      BP 158/79 mmHg  Pulse 71  Temp(Src) 98.5 F (36.9 C) (Oral)  Resp 16  Ht 6\' 2"  (1.88 m)  Wt 272 lb 11.2 oz (123.696 kg)  BMI 35.00 kg/m2  SpO2 95%  He does not have any contraindications present for PTNS, such as: Pacemaker, Implantable defibrillator, History of abnormal bleeding or History of neuropathies or nerve damage.  Discussed with patient possible complications of procedure, such as discomfort, bleeding at insertion/stimulation site, procedure consent signed  Patient goals:     His goals are to reduce urinary  urgency and frequency.  PTNS treatment:    The needle electrode was inserted into the lower, inner aspect of the patient's left leg. The surface electrode was placed on the inside arch of the foot on the treatment leg. The lead set was connected to the stimulator, and the needle electrode clip was connected to the needle electrode. The stimulator that produces an adjustable electrical pulse that travels to the sacral nerve plexus via the tibial nerve was increased to 14 and tell the patient received both a toe flex and sensory response.  Treatment Plan:     Patient tolerated the PTNS treatment well for 30 minutes. The electrode was removed with out difficulty. He will return in 1 week for his third PTNS treatment

## 2014-11-26 ENCOUNTER — Telehealth: Payer: Self-pay

## 2014-11-26 NOTE — Telephone Encounter (Signed)
Patient is requesting a refill of his Belsomra 10mg  to [facility], he can get a 90 day supply through them cheaper than the local pharmacy. Please call him once this is complete. He does know that Dr. Pollie Meyer be back in the office till next Tuesday. He has a follow up this month. Thanks

## 2014-11-27 NOTE — Telephone Encounter (Signed)
Spoke with patient and will discuss with Dr. Manuella Ghazi on Tuesday 12/01/2014 concerning this medication Belsomra 10mg 

## 2014-12-09 ENCOUNTER — Ambulatory Visit (INDEPENDENT_AMBULATORY_CARE_PROVIDER_SITE_OTHER): Payer: Medicare Other | Admitting: Urology

## 2014-12-09 ENCOUNTER — Encounter: Payer: Self-pay | Admitting: Urology

## 2014-12-09 VITALS — BP 127/78 | HR 71 | Ht 74.5 in | Wt 271.4 lb

## 2014-12-09 DIAGNOSIS — R3915 Urgency of urination: Secondary | ICD-10-CM | POA: Diagnosis not present

## 2014-12-09 LAB — PTNS-PERCUTANEOUS TIBIAL NERVE STIMULATION: Scan Result: 3

## 2014-12-14 ENCOUNTER — Encounter: Payer: Self-pay | Admitting: Family Medicine

## 2014-12-14 ENCOUNTER — Ambulatory Visit (INDEPENDENT_AMBULATORY_CARE_PROVIDER_SITE_OTHER): Payer: Medicare Other | Admitting: Family Medicine

## 2014-12-14 VITALS — BP 129/70 | HR 74 | Temp 97.4°F | Resp 19 | Ht 75.0 in | Wt 272.0 lb

## 2014-12-14 DIAGNOSIS — G4700 Insomnia, unspecified: Secondary | ICD-10-CM | POA: Diagnosis not present

## 2014-12-14 DIAGNOSIS — F5104 Psychophysiologic insomnia: Secondary | ICD-10-CM

## 2014-12-14 MED ORDER — SUVOREXANT 10 MG PO TABS: 1.0000 | ORAL_TABLET | Freq: Every evening | ORAL | Status: DC | PRN

## 2014-12-14 NOTE — Progress Notes (Signed)
Name: Seth BALDREE Sr.   MRN: [id number]    DOB: Feb 09, 1941   Date:12/14/2014       Progress Note  Subjective  Chief Complaint  Chief Complaint  Patient presents with  . Follow-up    1 mo. blood work  . Diabetes  . Hyperlipidemia    Insomnia Primary symptoms: sleep disturbance, difficulty falling asleep.  The symptoms are aggravated by anxiety. Past treatments include medication. Typical bedtime:  11-12 P.M..  PMH includes: hypertension.      Past Medical History  Diagnosis Date  . Coronary artery disease   . Hypertension   . Arthritis     rheumatoid   . GERD (gastroesophageal reflux disease)   . Urgency of urination   . Diabetes mellitus without complication   . Sleeping difficulty     takes med nightly  . Depression   . Localized osteoarthritis of right knee 05/21/2012  . Reflux     Past Surgical History  Procedure Laterality Date  . Foot surgery      x2  . Back surgery    . Coronary artery bypass graft  2004    x4 vessels  . Foot surgery      x2  . Transurethral resection of prostate    . 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 Kidney disease Neg Hx   . Bladder Cancer Brother   . Prostate cancer Neg Hx     Social History   Social History  . Marital Status: Widowed    Spouse Name: N/A  . Number of Children: N/A  . Years of Education: N/A   Occupational History  . Not on file.   Social History Main Topics  . Smoking status: Former Smoker    Quit date: 05/14/1992  . Smokeless tobacco: Not on file  . Alcohol Use: Yes     Comment: moderatly  . Drug Use: No  . Sexual Activity: Not on file   Other Topics Concern  . Not on file   Social History Narrative     Current outpatient prescriptions:  .  Ascorbic Acid (VITAMIN C) 1000 MG tablet, Take 2,000 mg by mouth  daily., Disp: , Rfl:  .  aspirin EC 81 MG tablet, Take 81 mg by mouth daily., Disp: , Rfl:  .  buPROPion (WELLBUTRIN XL) 300 MG 24 hr tablet, Take 1 tablet (300 mg total) by mouth daily., Disp: 90 tablet, Rfl: 0 .  busPIRone (BUSPAR) 15 MG tablet, Take 1 tablet (15 mg total) by mouth 2 (two) times daily., Disp: 180 tablet, Rfl: 0 .  Cholecalciferol (VITAMIN D) 2000 UNITS tablet, Take 2,000 Units by mouth daily., Disp: , Rfl:  .  clonazePAM (KLONOPIN) 0.5 MG tablet, Take 1 tablet (0.5 mg total) by mouth 2 (two) times daily as needed. anxiety, Disp: 60 tablet, Rfl: 0 .  Coenzyme Q10 (CO Q 10) 100 MG CAPS, Take 1 capsule by mouth daily., Disp: , Rfl:  .  ezetimibe-simvastatin (VYTORIN) 10-20 MG per tablet, Take 1 tablet by mouth daily at 6 PM., Disp: 90 tablet, Rfl: 0 .  fish oil-omega-3 fatty acids 1000 MG capsule, Take 2 g by mouth daily., Disp: , Rfl:  .  fluticasone (FLONASE) 50 MCG/ACT nasal spray, Place 2 sprays into the nose daily., Disp: , Rfl:  .  irbesartan (AVAPRO) 150 MG tablet, Take 1 tablet (150 mg total) by mouth daily., Disp: 90 tablet, Rfl: 0 .  metFORMIN (GLUCOPHAGE) 1000 MG tablet, Take 1 tablet (1,000 mg total) by mouth 2 (two) times daily with a meal., Disp: 180 tablet, Rfl: 0 .  Multiple Vitamin (MULTIVITAMIN WITH MINERALS) TABS, Take 1 tablet by mouth daily., Disp: , Rfl:  .  Multiple Vitamins-Calcium (CALCI-MAX PO), Take 1 tablet by mouth daily., Disp: , Rfl:  .  pantoprazole (PROTONIX) 40 MG tablet, Take 1 tablet (40 mg total) by mouth daily., Disp: 90 tablet, Rfl: 0 .  solifenacin (VESICARE) 10 MG tablet, Take 10 mg by mouth daily., Disp: , Rfl:  .  Suvorexant (BELSOMRA) 10 MG TABS, Take 1 tablet by mouth at bedtime as needed., Disp: 30 tablet, Rfl: 0  No Known Allergies   Review of Systems  Psychiatric/Behavioral: Positive for sleep disturbance. The patient has insomnia.       Objective  Filed Vitals:   12/14/14 1017  BP: 129/70  Pulse: 74  Temp: 97.4 F (36.3  C)  TempSrc: Oral  Resp: 19  Height: 6\' 3"  (1.905 m)  Weight: 272 lb (123.378 kg)  SpO2: 96%    Physical Exam  Constitutional: He is oriented to person, place, and time and well-developed, well-nourished, and in no distress.  Cardiovascular: Normal rate and regular rhythm.   Pulmonary/Chest: Effort normal and breath sounds normal.  Neurological: He is alert and oriented to person, place, and time.  Psychiatric: Affect and judgment normal.  Nursing note and vitals reviewed.   Assessment & Plan  1. Chronic insomnia  - Suvorexant (BELSOMRA) 10 MG TABS; Take 1 tablet by mouth at bedtime as needed.  Dispense: 90 tablet; Refill: 0   Shaneal Barasch [facility] 12/14/2014 10:44 AM

## 2014-12-14 NOTE — Progress Notes (Signed)
Chief Complaint:  Chief Complaint  Patient presents with  . Urinary Urgency    PTNS     HPI: Mr. Sanroman is a 74 year old white male with a long-standing history of urinary frequency, nocturia and urge incontinence. He does not recall specific event that led to the urinary symptoms.  His amount of leakage is variable. He wears depends when he goes on long car trips. He will have an occasional leakage when he is not driving, but it is a scant amount of urine.  He does not lose urine if he coughs, sneezes or laughs. He also does not lose urine if he changing position or bending or lifting.        He states he is having 5 trips to the bathroom during the day to void. He is getting up 3 times a night to void as well. He is not experiencing any pain or burning with urination. He has a strong urinary urgency. He is experiencing 1 incontinent episodes daily.     He is consuming 4 caffeinated beverages daily, occasional alcohol consumption and does not drink copious amount of water during the day.      Previous Therapy:     Patient had been evaluated and treated through an urologist in [place]. We have requested those records but have not yet received them at this time.  With our office he had a trial of Myrbetriq, which he failed.  He was given a trial of Vesicare 10 mg daily. He found a great relief with this medication, but his insurance would not cover the medication.      BP 127/78 mmHg  Pulse 71  Ht 6' 2.5" (1.892 m)  Wt 271 lb 6.4 oz (123.106 kg)  BMI 34.39 kg/m2  He does not have any contraindications present for PTNS, such as: Pacemaker, Implantable defibrillator, History of abnormal bleeding or History of neuropathies or nerve damage.  Discussed with patient possible complications of procedure, such as discomfort, bleeding at insertion/stimulation site, procedure consent signed  Patient goals:     His goals are to reduce urinary urgency and frequency.  PTNS treatment:    The  needle electrode was inserted into the lower, inner aspect of the patient's left leg. The surface electrode was placed on the inside arch of the foot on the treatment leg. The lead set was connected to the stimulator, and the needle electrode clip was connected to the needle electrode. The stimulator that produces an adjustable electrical pulse that travels to the sacral nerve plexus via the tibial nerve was increased to 3 and tell the patient received both a toe flex and sensory response.  Treatment Plan:     Patient tolerated the PTNS treatment well for 30 minutes. The electrode was removed with out difficulty. He will return in 1 week for his fourth PTNS treatment.   Patient is given Vesicare 10 mg samples #28.

## 2014-12-16 ENCOUNTER — Ambulatory Visit (INDEPENDENT_AMBULATORY_CARE_PROVIDER_SITE_OTHER): Payer: Medicare Other | Admitting: Urology

## 2014-12-16 ENCOUNTER — Encounter: Payer: Self-pay | Admitting: Urology

## 2014-12-16 VITALS — BP 118/75 | HR 64 | Ht 74.5 in | Wt 271.7 lb

## 2014-12-16 DIAGNOSIS — R3915 Urgency of urination: Secondary | ICD-10-CM

## 2014-12-16 DIAGNOSIS — N3941 Urge incontinence: Secondary | ICD-10-CM

## 2014-12-16 LAB — PTNS-PERCUTANEOUS TIBIAL NERVE STIMULATION: Scan Result: 3

## 2014-12-16 NOTE — Progress Notes (Signed)
Chief Complaint:  Chief Complaint  Patient presents with  . Urge Incontinence    PTNS     HPI: Seth Mcintosh is a 75 year old white male with a long-standing history of urinary frequency, nocturia and urge incontinence. He does not recall specific event that led to the urinary symptoms.  His amount of leakage is variable. He wears depends when he goes on long car trips. He will have an occasional leakage when he is not driving, but it is a scant amount of urine.  He does not lose urine if he coughs, sneezes or laughs. He also does not lose urine if he changing position or bending or lifting.        He states he is having 10 (worsening) trips to the bathroom during the day to void. He is getting up 2 (improving) times a night to void as well. He is not experiencing any pain or burning with urination. He has a strong urinary urgency. He is experiencing 0 (improving) incontinent episodes daily.     He is consuming 4 caffeinated beverages daily, occasional alcohol consumption and does not drink copious amount of water during the day.      Previous Therapy:     Patient had been evaluated and treated through an urologist in [place]. We have requested those records but have not yet received them at this time.  With our office he had a trial of Myrbetriq, which he failed.  He was given a trial of Vesicare 10 mg daily. He found a great relief with this medication, but his insurance would not cover the medication.      Blood pressure 118/75, pulse 64, height 6' 2.5" (1.892 m), weight 271 lb 11.2 oz (123.242 kg).  He does not have any contraindications present for PTNS, such as: Pacemaker, Implantable defibrillator, History of abnormal bleeding or History of neuropathies or nerve damage.  Discussed with patient possible complications of procedure, such as discomfort, bleeding at insertion/stimulation site, procedure consent signed  Patient goals:     His goals are to reduce urinary urgency and  frequency.  PTNS treatment:    The needle electrode was inserted into the lower, inner aspect of the patient's right leg. The surface electrode was placed on the inside arch of the foot on the treatment leg. The lead set was connected to the stimulator, and the needle electrode clip was connected to the needle electrode. The stimulator that produces an adjustable electrical pulse that travels to the sacral nerve plexus via the tibial nerve was increased to 3 and tell the patient received both a toe flex and sensory response.  Treatment Plan:     Patient tolerated the PTNS treatment well for 30 minutes. The electrode was removed with out difficulty. He will return in 1 week for his fifth PTNS treatment.   Patient is given Vesicare 10 mg samples #28.

## 2014-12-23 ENCOUNTER — Encounter: Payer: Self-pay | Admitting: Urology

## 2014-12-23 ENCOUNTER — Ambulatory Visit (INDEPENDENT_AMBULATORY_CARE_PROVIDER_SITE_OTHER): Payer: Medicare Other | Admitting: Urology

## 2014-12-23 VITALS — BP 138/82 | HR 76 | Ht 74.5 in | Wt 271.0 lb

## 2014-12-23 DIAGNOSIS — R3915 Urgency of urination: Secondary | ICD-10-CM | POA: Diagnosis not present

## 2014-12-23 LAB — PTNS-PERCUTANEOUS TIBIAL NERVE STIMULATION: Scan Result: 7

## 2014-12-23 MED ORDER — SOLIFENACIN SUCCINATE 10 MG PO TABS: 10.0000 mg | ORAL_TABLET | Freq: Every day | ORAL | Status: DC

## 2014-12-23 NOTE — Progress Notes (Signed)
Chief Complaint:  Chief Complaint  Patient presents with  . Urinary Urgency    ptns     HPI: Patient is a 74 year old white male who presents today for PTNS treatment for urinary urgency. This is the fifth treatment in a series of 12  Background history Mr. Sharps is a 74 year old white male with a long-standing history of urinary frequency, nocturia and urge incontinence. He does not recall specific event that led to the urinary symptoms.  His amount of leakage is variable. He wears depends when he goes on long car trips. He will have an occasional leakage when he is not driving, but it is a scant amount of urine.  He does not lose urine if he coughs, sneezes or laughs. He also does not lose urine if he changing position or bending or lifting.        After his last treatment, he states he is having 9 (improving) trips to the bathroom during the day to void. He is getting up 1 (improving) times a night to void as well. He is not experiencing any pain or burning with urination. He has a strong urinary urgency. He is experiencing 1 (worsening) incontinent episodes daily.     He is consuming 4 caffeinated beverages daily, occasional alcohol consumption and does not drink copious amount of water during the day.      Previous Therapy: Patient had been evaluated and treated through an urologist in [place]. He has tried Dispensing optician, Chartered certified accountant, Advertising copywriter and Vesicare in the past. All with mixed results. With our office he had a trial of Myrbetriq, which he failed.  He was given a trial of Vesicare 10 mg daily. He found a great relief with this medication, but his insurance would not cover the medication.  Blood pressure 138/82, pulse 76, height 6' 2.5" (1.892 m), weight 271 lb (122.925 kg).  He does not have any contraindications present for PTNS, such as: Pacemaker, Implantable defibrillator, History of abnormal bleeding or History of neuropathies or nerve damage.  Discussed with patient possible  complications of procedure, such as discomfort, bleeding at insertion/stimulation site, procedure consent signed  Patient goals: His goals are to reduce urinary urgency and frequency.  PTNS treatment: The needle electrode was inserted into the lower, inner aspect of the patient's right leg. The surface electrode was placed on the inside arch of the foot on the treatment leg. The lead set was connected to the stimulator, and the needle electrode clip was connected to the needle electrode. The stimulator that produces an adjustable electrical pulse that travels to the sacral nerve plexus via the tibial nerve was increased to 7 and tell the patient received both a toe flex and sensory response.  Treatment Plan:  Patient tolerated the PTNS treatment well for 30 minutes. The electrode was removed with out difficulty. He will return in 1 week for his 6  PTNS treatment.   Patient is given Vesicare 10 mg samples #28.

## 2014-12-30 ENCOUNTER — Ambulatory Visit: Payer: Medicare Other | Admitting: Urology

## 2015-01-06 ENCOUNTER — Encounter: Payer: Self-pay | Admitting: Urology

## 2015-01-06 ENCOUNTER — Ambulatory Visit (INDEPENDENT_AMBULATORY_CARE_PROVIDER_SITE_OTHER): Payer: Medicare Other | Admitting: Urology

## 2015-01-06 VITALS — BP 125/87 | HR 77 | Ht 74.5 in | Wt 269.5 lb

## 2015-01-06 DIAGNOSIS — R3915 Urgency of urination: Secondary | ICD-10-CM

## 2015-01-06 LAB — PTNS-PERCUTANEOUS TIBIAL NERVE STIMULATION: SCAN RESULT: 8

## 2015-01-06 MED ORDER — SOLIFENACIN SUCCINATE 10 MG PO TABS: 10.0000 mg | ORAL_TABLET | Freq: Every day | ORAL | Status: AC

## 2015-01-06 NOTE — Progress Notes (Signed)
Chief Complaint:  Chief Complaint  Patient presents with  . Urinary Urgency    PTNS     HPI: Patient is a 74 year old white male who presents today for PTNS treatment for urinary urgency. This is the #6 treatment in a series of 12  Background history Mr. [name] is a 74 year old white male with a long-standing history of urinary frequency, nocturia and urge incontinence. He does not recall specific event that led to the urinary symptoms.  His amount of leakage is variable. He wears depends when he goes on long car trips. He will have an occasional leakage when he is not driving, but it is a scant amount of urine.  He does not lose urine if he coughs, sneezes or laughs. He also does not lose urine if he changing position or bending or lifting.        After his last treatment, he states he is having 12 (worsening) trips to the bathroom during the day to void. He is getting up 1 (unchanged) times a night to void as well. He is not experiencing any pain or burning with urination. He has a strong urinary urgency. He is experiencing 1 (unchanged) incontinent episodes daily.     He is consuming 2 caffeinated beverages daily, occasional alcohol consumption and does not drink copious amount of water during the day.      Previous Therapy: Patient had been evaluated and treated through an urologist in [place]. He has tried Dispensing optician, Chartered certified accountant, Advertising copywriter and Vesicare in the past. All with mixed results. With our office he had a trial of Myrbetriq, which he failed.  He was given a trial of Vesicare 10 mg daily. He found a great relief with this medication, but his insurance would not cover the medication.  Blood pressure 138/82, pulse 76, height 6' 2.5" (1.892 m), weight 271 lb (122.925 kg).  He does not have any contraindications present for PTNS, such as: Pacemaker, Implantable defibrillator, History of abnormal bleeding or History of neuropathies or nerve damage.  Discussed with patient possible  complications of procedure, such as discomfort, bleeding at insertion/stimulation site, procedure consent signed  Patient goals: His goals are to reduce urinary urgency and frequency.  PTNS treatment: The needle electrode was inserted into the lower, inner aspect of the patient's right leg. The surface electrode was placed on the inside arch of the foot on the treatment leg. The lead set was connected to the stimulator, and the needle electrode clip was connected to the needle electrode. The stimulator that produces an adjustable electrical pulse that travels to the sacral nerve plexus via the tibial nerve was increased to 8 and tell the patient received both a toe flex and sensory response.  Treatment Plan:  Patient tolerated the PTNS treatment well for 30 minutes. The electrode was removed with out difficulty. He will return in 1 week for his 7th  PTNS treatment.

## 2015-01-07 ENCOUNTER — Ambulatory Visit: Payer: Medicare Other | Admitting: Family Medicine

## 2015-01-08 ENCOUNTER — Telehealth: Payer: Self-pay | Admitting: Family Medicine

## 2015-01-08 ENCOUNTER — Other Ambulatory Visit: Payer: Self-pay | Admitting: Family Medicine

## 2015-01-08 ENCOUNTER — Ambulatory Visit (INDEPENDENT_AMBULATORY_CARE_PROVIDER_SITE_OTHER): Payer: Medicare Other | Admitting: Family Medicine

## 2015-01-08 ENCOUNTER — Ambulatory Visit
Admission: RE | Admit: 2015-01-08 | Discharge: 2015-01-08 | Disposition: A | Discharge status: Home / Self Care | Payer: Medicare Other | Source: Ambulatory Visit | Attending: Family Medicine | Admitting: Family Medicine

## 2015-01-08 ENCOUNTER — Encounter: Payer: Self-pay | Admitting: Family Medicine

## 2015-01-08 VITALS — BP 110/70 | HR 82 | Temp 97.9°F | Resp 18 | Ht 74.0 in | Wt 270.3 lb

## 2015-01-08 DIAGNOSIS — E669 Obesity, unspecified: Secondary | ICD-10-CM

## 2015-01-08 DIAGNOSIS — M79605 Pain in left leg: Secondary | ICD-10-CM

## 2015-01-08 DIAGNOSIS — M79662 Pain in left lower leg: Secondary | ICD-10-CM | POA: Insufficient documentation

## 2015-01-08 DIAGNOSIS — E119 Type 2 diabetes mellitus without complications: Secondary | ICD-10-CM | POA: Diagnosis not present

## 2015-01-08 DIAGNOSIS — I868 Varicose veins of other specified sites: Secondary | ICD-10-CM | POA: Diagnosis not present

## 2015-01-08 DIAGNOSIS — L539 Erythematous condition, unspecified: Secondary | ICD-10-CM

## 2015-01-08 DIAGNOSIS — I82402 Acute embolism and thrombosis of unspecified deep veins of left lower extremity: Secondary | ICD-10-CM

## 2015-01-08 DIAGNOSIS — E1169 Type 2 diabetes mellitus with other specified complication: Secondary | ICD-10-CM

## 2015-01-08 DIAGNOSIS — M7989 Other specified soft tissue disorders: Secondary | ICD-10-CM

## 2015-01-08 DIAGNOSIS — I8390 Asymptomatic varicose veins of unspecified lower extremity: Secondary | ICD-10-CM

## 2015-01-08 NOTE — Progress Notes (Signed)
Name: Seth DANN Sr.   MRN: [id number]    DOB: 23-Jan-1941   Date:01/08/2015       Progress Note  Subjective  Chief Complaint  Chief Complaint  Patient presents with  . Leg Pain    hx of phlebitis, red, swollen for 2 weeks,Need Vascular referral    HPI  Right leg pain and swelling.  Patient has a history of superficial phlebitis and cellulitis the past by his report. He brings with presents with a two-week history of his pain swelling and redness of the right lower extremity. There's been no hemoptysis or shortness of breath or chest pain associated. There's been no fever or chills myalgias and there's been no streaking consistent with lymphangitis of the lower extremity. He's been local measures and over-the-counter meds at this point. It is of note that he has diabetic and has coronary artery disease as well.  Past Medical History  Diagnosis Date  . Coronary artery disease   . Hypertension   . Arthritis     rheumatoid   . GERD (gastroesophageal reflux disease)   . Urgency of urination   . Diabetes mellitus without complication   . Sleeping difficulty     takes med nightly  . Depression   . Localized osteoarthritis of right knee 05/21/2012  . Reflux     Social History  Substance Use Topics  . Smoking status: Former Smoker    Quit date: 05/14/1992  . Smokeless tobacco: Not on file  . Alcohol Use: 0.0 oz/week    0 Standard drinks or equivalent per week     Comment: moderatly     Current outpatient prescriptions:  .  Ascorbic Acid (VITAMIN C) 1000 MG tablet, Take 2,000 mg by mouth daily., Disp: , Rfl:  .  aspirin EC 81 MG tablet, Take 81 mg by mouth daily., Disp: , Rfl:  .  buPROPion (WELLBUTRIN XL) 300 MG 24 hr tablet, Take 1 tablet (300 mg total) by mouth daily., Disp: 90 tablet, Rfl: 0 .  busPIRone (BUSPAR) 15 MG tablet, Take 1 tablet (15 mg total) by mouth 2 (two) times daily., Disp: 180 tablet, Rfl: 0 .  Cholecalciferol (VITAMIN D) 2000 UNITS tablet, Take 2,000  Units by mouth daily., Disp: , Rfl:  .  clonazePAM (KLONOPIN) 0.5 MG tablet, Take 1 tablet (0.5 mg total) by mouth 2 (two) times daily as needed. anxiety, Disp: 60 tablet, Rfl: 0 .  Coenzyme Q10 (CO Q 10) 100 MG CAPS, Take 1 capsule by mouth daily., Disp: , Rfl:  .  ezetimibe-simvastatin (VYTORIN) 10-20 MG per tablet, Take 1 tablet by mouth daily at 6 PM., Disp: 90 tablet, Rfl: 0 .  fish oil-omega-3 fatty acids 1000 MG capsule, Take 2 g by mouth daily., Disp: , Rfl:  .  fluticasone (FLONASE) 50 MCG/ACT nasal spray, Place 2 sprays into the nose daily., Disp: , Rfl:  .  irbesartan (AVAPRO) 150 MG tablet, Take 1 tablet (150 mg total) by mouth daily., Disp: 90 tablet, Rfl: 0 .  metFORMIN (GLUCOPHAGE) 1000 MG tablet, Take 1 tablet (1,000 mg total) by mouth 2 (two) times daily with a meal., Disp: 180 tablet, Rfl: 0 .  Multiple Vitamin (MULTIVITAMIN WITH MINERALS) TABS, Take 1 tablet by mouth daily., Disp: , Rfl:  .  Multiple Vitamins-Calcium (CALCI-MAX PO), Take 1 tablet by mouth daily., Disp: , Rfl:  .  pantoprazole (PROTONIX) 40 MG tablet, Take 1 tablet (40 mg total) by mouth daily., Disp: 90 tablet, Rfl: 0 .  solifenacin (VESICARE) 10 MG tablet, Take 1 tablet (10 mg total) by mouth daily., Disp: 90 tablet, Rfl: 3 .  Suvorexant (BELSOMRA) 10 MG TABS, Take 1 tablet by mouth at bedtime as needed., Disp: 90 tablet, Rfl: 0  No Known Allergies  Review of Systems  Constitutional: Negative for fever, chills and weight loss.  HENT: Negative for congestion, hearing loss, sore throat and tinnitus.   Eyes: Negative for blurred vision, double vision and redness.  Respiratory: Negative for cough, hemoptysis, sputum production, shortness of breath and wheezing.   Cardiovascular: Positive for leg swelling. Negative for chest pain, palpitations, orthopnea and claudication.       There is pain swelling and redness of the right calf and anterior tibial area.  Gastrointestinal: Negative for heartburn, nausea,  vomiting, diarrhea, constipation and blood in stool.  Genitourinary: Negative for dysuria, urgency, frequency and hematuria.  Musculoskeletal: Positive for joint pain. Negative for myalgias, back pain, falls and neck pain.  Skin: Positive for itching and rash.  Neurological: Negative for dizziness, tingling, tremors, focal weakness, seizures, loss of consciousness, weakness and headaches.  Endo/Heme/Allergies: Does not bruise/bleed easily.  Psychiatric/Behavioral: Negative for depression and substance abuse. The patient is not nervous/anxious and does not have insomnia.      Objective  Filed Vitals:   01/08/15 1019  BP: 110/70  Pulse: 82  Temp: 97.9 F (36.6 C)  TempSrc: Oral  Resp: 18  Height: 6\' 2"  (1.88 m)  Weight: 270 lb 4.8 oz (122.607 kg)  SpO2: 96%     Physical Exam  Constitutional: He is oriented to person, place, and time.  Obese male who is in no acute distress  HENT:  Head: Normocephalic.  Eyes: EOM are normal. Pupils are equal, round, and reactive to light.  Neck: Normal range of motion. Neck supple. No thyromegaly present.  Cardiovascular: Normal rate, regular rhythm and normal heart sounds.   No murmur heard. Difficult to palpate distal pulses because of edema  Pulmonary/Chest: Effort normal and breath sounds normal. No respiratory distress. He has no wheezes.  Abdominal: Soft. Bowel sounds are normal.  Musculoskeletal: Normal range of motion. He exhibits edema and tenderness.  Lymphadenopathy:    He has no cervical adenopathy.  Neurological: He is alert and oriented to person, place, and time. No cranial nerve deficit. Gait normal. Coordination normal.  Skin: No rash noted.  There is erythema in the mid to distal left calf area. There is warmth to palpation as well. There is no palpable cord and there is no lymphangitis noted. There is no tenderness in the popliteal and saphenous area. Homans sign is negative. Numerous superficial venous varicosities are  noted none of which are thrombosed  Psychiatric: Affect and judgment normal.      Assessment & Plan  1. Pain of left lower extremity Rule out DVT - D-Dimer, Quantitative - Ambulatory referral to Vascular Surgery - US Venous Img Lower Unilateral Left; Future  2. Edema of the left lower extremity Rule out DVT - US Venous Img Lower Unilateral Left; Future  3. Varicose vein Long-standing - D-Dimer, Quantitative - Ambulatory referral to Vascular Surgery - US Venous Img Lower Unilateral Left; Future   4.Diabetes mellitus 2 well-controlled

## 2015-01-08 NOTE — Telephone Encounter (Signed)
Anna from [facility]: did left leg doppler negative for DVT. Miel spoke with Dr Rutherford Nail at 3:05p on 01-08-15 and Dr Francena Hanly to release the patient.

## 2015-01-09 LAB — D-DIMER, QUANTITATIVE: D-DIMER: 0.53 mg/L FEU — ABNORMAL HIGH (ref 0.00–0.49)

## 2015-01-10 ENCOUNTER — Other Ambulatory Visit: Payer: Self-pay | Admitting: Family Medicine

## 2015-01-11 NOTE — Telephone Encounter (Signed)
Patient notified of results.

## 2015-01-13 ENCOUNTER — Ambulatory Visit: Payer: Medicare Other | Admitting: Urology

## 2015-01-19 ENCOUNTER — Encounter: Payer: Self-pay | Admitting: Family Medicine

## 2015-01-20 ENCOUNTER — Ambulatory Visit (INDEPENDENT_AMBULATORY_CARE_PROVIDER_SITE_OTHER): Payer: Medicare Other | Admitting: Urology

## 2015-01-20 VITALS — BP 128/70 | HR 71 | Ht 74.5 in | Wt 270.4 lb

## 2015-01-20 DIAGNOSIS — R3915 Urgency of urination: Secondary | ICD-10-CM | POA: Diagnosis not present

## 2015-01-20 LAB — PTNS-PERCUTANEOUS TIBIAL NERVE STIMULATION: SCAN RESULT: 6

## 2015-01-21 ENCOUNTER — Other Ambulatory Visit: Payer: Self-pay | Admitting: Family Medicine

## 2015-01-21 DIAGNOSIS — E119 Type 2 diabetes mellitus without complications: Secondary | ICD-10-CM

## 2015-01-22 ENCOUNTER — Encounter: Payer: Self-pay | Admitting: Urology

## 2015-01-22 NOTE — Progress Notes (Signed)
Chief Complaint:  Chief Complaint  Patient presents with  . Urinary Urgency    PTNS     HPI: Patient is a 74 year old white male who presents today for PTNS treatment for urinary urgency. This is the # 7 treatment in a series of 12  Background history Mr. Fuchs is a 74 year old white male with a long-standing history of urinary frequency, nocturia and urge incontinence. He does not recall specific event that led to the urinary symptoms.  His amount of leakage is variable. He wears depends when he goes on long car trips. He will have an occasional leakage when he is not driving, but it is a scant amount of urine.  He does not lose urine if he coughs, sneezes or laughs. He also does not lose urine if he changing position or bending or lifting.        After his last treatment, he states he is having 12 (unchanged) trips to the bathroom during the day to void. He is getting up 1 (unchanged) times a night to void as well. He is not experiencing any pain or burning with urination. He has a strong urinary urgency. He is experiencing 1 (unchanged) incontinent episodes daily.     He is consuming 2 caffeinated beverages daily, occasional alcohol consumption and does not drink copious amount of water during the day.      Previous Therapy: Patient had been evaluated and treated through an urologist in [place]. He has tried Dispensing optician, Chartered certified accountant, Advertising copywriter and Vesicare in the past. All with mixed results. With our office he had a trial of Myrbetriq, which he failed.  He was given a trial of Vesicare 10 mg daily. He found a great relief with this medication, but his insurance would not cover the medication.  Blood pressure 128/70, pulse 71, height 6' 2.5" (1.892 m), weight 270 lb 6.4 oz (122.653 kg).  He does not have any contraindications present for PTNS, such as: Pacemaker, Implantable defibrillator, History of abnormal bleeding or History of neuropathies or nerve damage.  Discussed with patient possible  complications of procedure, such as discomfort, bleeding at insertion/stimulation site, procedure consent signed  Patient goals: His goals are to reduce urinary urgency and frequency.  PTNS treatment: The needle electrode was inserted into the lower, inner aspect of the patient's right leg. The surface electrode was placed on the inside arch of the foot on the treatment leg. The lead set was connected to the stimulator, and the needle electrode clip was connected to the needle electrode. The stimulator that produces an adjustable electrical pulse that travels to the sacral nerve plexus via the tibial nerve was increased to 6 and tell the patient received both a toe flex and sensory response.  Treatment Plan:  Patient tolerated the PTNS treatment well for 30 minutes. The electrode was removed with out difficulty. He will return in 1 week for his 8th  PTNS treatment.

## 2015-01-27 ENCOUNTER — Ambulatory Visit: Payer: Medicare Other | Admitting: Urology

## 2015-01-27 ENCOUNTER — Ambulatory Visit (INDEPENDENT_AMBULATORY_CARE_PROVIDER_SITE_OTHER): Payer: Medicare Other | Admitting: Urology

## 2015-01-27 ENCOUNTER — Encounter: Payer: Self-pay | Admitting: Urology

## 2015-01-27 VITALS — BP 122/79 | HR 77 | Ht 74.0 in | Wt 268.8 lb

## 2015-01-27 DIAGNOSIS — R350 Frequency of micturition: Secondary | ICD-10-CM

## 2015-01-27 DIAGNOSIS — R3915 Urgency of urination: Secondary | ICD-10-CM | POA: Diagnosis not present

## 2015-01-27 LAB — PTNS-PERCUTANEOUS TIBIAL NERVE STIMULATION: SCAN RESULT: 8

## 2015-01-27 NOTE — Progress Notes (Signed)
Chief Complaint:  Chief Complaint  Patient presents with  . Urinary Urgency    PTNS     HPI: Patient is a 74 year old white male who presents today for PTNS treatment for urinary urgency. This is the # 8 treatment in a series of 12  Background history Mr. Dieudonne is a 74 year old white male with a long-standing history of urinary frequency, nocturia and urge incontinence. He does not recall specific event that led to the urinary symptoms.  His amount of leakage is variable. He wears depends when he goes on long car trips. He will have an occasional leakage when he is not driving, but it is a scant amount of urine.  He does not lose urine if he coughs, sneezes or laughs. He also does not lose urine if he changing position or bending or lifting.        After his last treatment, he states he is having 12 (unchanged) trips to the bathroom during the day to void. He is getting up 1 (unchanged) times a night to void as well. He is not experiencing any pain or burning with urination. He has a strong urinary urgency. He is experiencing 1 (unchanged) incontinent episodes daily.     He is consuming 2 caffeinated beverages daily, occasional alcohol consumption and does not drink copious amount of water during the day.      Previous Therapy: Patient had been evaluated and treated through an urologist in [place]. He has tried Dispensing optician, Chartered certified accountant, Advertising copywriter and Vesicare in the past. All with mixed results. With our office he had a trial of Myrbetriq, which he failed.  He was given a trial of Vesicare 10 mg daily. He found a great relief with this medication, but his insurance would not cover the medication.  Blood pressure 122/79, pulse 77, height 6\' 2"  (1.88 m), weight 268 lb 12.8 oz (121.927 kg).  He does not have any contraindications present for PTNS, such as: Pacemaker, Implantable defibrillator, History of abnormal bleeding or History of neuropathies or nerve damage.  Discussed with patient possible  complications of procedure, such as discomfort, bleeding at insertion/stimulation site, procedure consent signed  Patient goals: His goals are to reduce urinary urgency and frequency.  PTNS treatment: The needle electrode was inserted into the lower, inner aspect of the patient's right leg. The surface electrode was placed on the inside arch of the foot on the treatment leg. The lead set was connected to the stimulator, and the needle electrode clip was connected to the needle electrode. The stimulator that produces an adjustable electrical pulse that travels to the sacral nerve plexus via the tibial nerve was increased to 8 and tell the patient received both a toe flex and sensory response.  Treatment Plan:  Patient tolerated the PTNS treatment well for 30 minutes. The electrode was removed with out difficulty. He will return in 1 week for his 9th  PTNS treatment.

## 2015-02-03 ENCOUNTER — Ambulatory Visit (INDEPENDENT_AMBULATORY_CARE_PROVIDER_SITE_OTHER): Payer: Medicare Other | Admitting: Obstetrics and Gynecology

## 2015-02-03 ENCOUNTER — Encounter: Payer: Self-pay | Admitting: Obstetrics and Gynecology

## 2015-02-03 VITALS — BP 144/76 | HR 58 | Ht 74.0 in | Wt 271.1 lb

## 2015-02-03 DIAGNOSIS — R3915 Urgency of urination: Secondary | ICD-10-CM | POA: Diagnosis not present

## 2015-02-03 LAB — PTNS-PERCUTANEOUS TIBIAL NERVE STIMULATION: Scan Result: 7

## 2015-02-03 NOTE — Progress Notes (Signed)
HPI: Patient is a 74 year old white male who presents today for PTNS treatment for urinary urgency. This is the # 8 treatment in a series of 12  Background history Mr. Hochmuth is a 74 year old white male with a long-standing history of urinary frequency, nocturia and urge incontinence. He does not recall specific event that led to the urinary symptoms. His amount of leakage is variable. He wears depends when he goes on long car trips. He will have an occasional leakage when he is not driving, but it is a scant amount of urine. He does not lose urine if he coughs, sneezes or laughs. He also does not lose urine if he changing position or bending or lifting.    After his last treatment, he states he is having 13 (worse) trips to the bathroom during the day to void. He is getting up 1 (unchanged) times a night to void as well. He is not experiencing any pain or burning with urination. He has a strong urinary urgency. He is experiencing <1 (improved) incontinent episodes daily.   He is consuming 3 caffeinated beverages daily (increased), occasional alcohol consumption and does not drink copious amount of water during the day.   Patient states that his sister recently died and that he has been performing funeral arrangements and dealing with her state over this last week. He has been drinking more caffeinated beverages and under more stress than usual which could explain his worsening urinary symptoms.   Previous Therapy: Patient had been evaluated and treated through an urologist in [place]. He has tried Dispensing optician, Chartered certified accountant, Advertising copywriter and Vesicare in the past. All with mixed results. With our office he had a trial of Myrbetriq, which he failed. He was given a trial of Vesicare 10 mg daily. He found a great relief with this medication, but his insurance would not cover the medication.  BP 144/76 mmHg  Pulse 58  Ht 6\' 2"  (1.88 m)  Wt 271 lb 1.6 oz (122.97 kg)  BMI 34.79 kg/m2  He does not have  any contraindications present for PTNS, such as: Pacemaker, Implantable defibrillator, History of abnormal bleeding or History of neuropathies or nerve damage.  Discussed with patient possible complications of procedure, such as discomfort, bleeding at insertion/stimulation site, procedure consent signed  Patient goals: His goals are to reduce urinary urgency and frequency.  PTNS treatment: The needle electrode was inserted into the lower, inner aspect of the patient's right leg. The surface electrode was placed on the inside arch of the foot on the treatment leg. The lead set was connected to the stimulator, and the needle electrode clip was connected to the needle electrode. The stimulator that produces an adjustable electrical pulse that travels to the sacral nerve plexus via the tibial nerve was increased to 8 and tell the patient received both a toe flex and sensory response.  Treatment Plan:  Patient tolerated the PTNS treatment well for 30 minutes. The electrode was removed with out difficulty. He will return in 1 week for his 9th PTNS treatment.

## 2015-02-08 ENCOUNTER — Other Ambulatory Visit: Payer: Self-pay | Admitting: Family Medicine

## 2015-02-10 ENCOUNTER — Ambulatory Visit (INDEPENDENT_AMBULATORY_CARE_PROVIDER_SITE_OTHER): Payer: Medicare Other | Admitting: Urology

## 2015-02-10 ENCOUNTER — Encounter: Payer: Self-pay | Admitting: Urology

## 2015-02-10 VITALS — BP 132/74 | HR 71 | Ht 74.0 in | Wt 268.6 lb

## 2015-02-10 DIAGNOSIS — R3915 Urgency of urination: Secondary | ICD-10-CM

## 2015-02-10 LAB — PTNS-PERCUTANEOUS TIBIAL NERVE STIMULATION: Scan Result: 14

## 2015-02-10 NOTE — Progress Notes (Signed)
Chief Complaint:  Chief Complaint  Patient presents with  . Urinary Urgency    PTNS     HPI: Patient is a 74 year old white male who presents today for PTNS treatment for urinary urgency. This is the # 10 treatment in a series of 12  Background history Mr. Oakland is a 74 year old white male with a long-standing history of urinary frequency, nocturia and urge incontinence. He does not recall specific event that led to the urinary symptoms.  His amount of leakage is variable. He wears depends when he goes on long car trips. He will have an occasional leakage when he is not driving, but it is a scant amount of urine.  He does not lose urine if he coughs, sneezes or laughs. He also does not lose urine if he changing position or bending or lifting.        After his last treatment, he states he is having 12 (unchanged) trips to the bathroom during the day to void. He is getting up 2 ( worsening) times a night to void as well. He is not experiencing any pain or burning with urination. He has a strong urinary urgency. He is experiencing 0 ( improvement) incontinent episodes daily.     He is consuming 2 caffeinated beverages daily, occasional alcohol consumption and does not drink copious amount of water during the day.      Previous Therapy: Patient had been evaluated and treated through an urologist in [place]. He has tried Dispensing optician, Chartered certified accountant, Advertising copywriter and Vesicare in the past. All with mixed results. With our office he had a trial of Myrbetriq, which he failed.  He was given a trial of Vesicare 10 mg daily. He found a great relief with this medication, but his insurance would not cover the medication.  Blood pressure 132/74, pulse 71, height 6\' 2"  (1.88 m), weight 268 lb 9.6 oz (121.836 kg).  He does not have any contraindications present for PTNS, such as: Pacemaker, Implantable defibrillator, History of abnormal bleeding or History of neuropathies or nerve damage.  Discussed with patient possible  complications of procedure, such as discomfort, bleeding at insertion/stimulation site, procedure consent signed  Patient goals: His goals are to reduce urinary urgency and frequency.  PTNS treatment: The needle electrode was inserted into the lower, inner aspect of the patient's right leg. The surface electrode was placed on the inside arch of the foot on the treatment leg. The lead set was connected to the stimulator, and the needle electrode clip was connected to the needle electrode. The stimulator that produces an adjustable electrical pulse that travels to the sacral nerve plexus via the tibial nerve was increased to 14 and  the patient received a sensory response.  Treatment Plan:  Patient tolerated the PTNS treatment well for 30 minutes. The electrode was removed with out difficulty. He will return in 1 week for his 11th  PTNS treatment.

## 2015-02-12 ENCOUNTER — Encounter: Payer: Self-pay | Admitting: Family Medicine

## 2015-02-12 ENCOUNTER — Ambulatory Visit (INDEPENDENT_AMBULATORY_CARE_PROVIDER_SITE_OTHER): Payer: Medicare Other | Admitting: Family Medicine

## 2015-02-12 VITALS — BP 140/75 | HR 82 | Temp 97.6°F | Resp 18 | Ht 74.0 in | Wt 271.0 lb

## 2015-02-12 DIAGNOSIS — I2510 Atherosclerotic heart disease of native coronary artery without angina pectoris: Secondary | ICD-10-CM | POA: Insufficient documentation

## 2015-02-12 DIAGNOSIS — F411 Generalized anxiety disorder: Secondary | ICD-10-CM | POA: Diagnosis not present

## 2015-02-12 DIAGNOSIS — E119 Type 2 diabetes mellitus without complications: Secondary | ICD-10-CM

## 2015-02-12 LAB — POCT GLYCOSYLATED HEMOGLOBIN (HGB A1C): HEMOGLOBIN A1C: 6.37

## 2015-02-12 LAB — GLUCOSE, POCT (MANUAL RESULT ENTRY): POC GLUCOSE: 177 mg/dL — AB (ref 70–99)

## 2015-02-12 MED ORDER — CLONAZEPAM 0.5 MG PO TABS: 0.5000 mg | ORAL_TABLET | Freq: Two times a day (BID) | ORAL | Status: DC | PRN

## 2015-02-12 NOTE — Progress Notes (Signed)
Name: Seth MARTELLE Sr.   MRN: [id number]    DOB: [birth year]   Date:02/12/2015       Progress Note  Subjective  Chief Complaint  Chief Complaint  Patient presents with  . Follow-up    2 mo  . Labs Only  . Diabetes  . Anxiety  . Hyperlipidemia  . Gastroesophageal Reflux    Diabetes He presents for his follow-up diabetic visit. He has type 2 diabetes mellitus. Hypoglycemia symptoms include nervousness/anxiousness. Pertinent negatives for diabetes include no fatigue, no polydipsia, no polyuria and no weight loss. Current diabetic treatment includes oral agent (monotherapy). He is following a diabetic diet. His breakfast blood glucose range is generally 110-130 mg/dl. An ACE inhibitor/angiotensin II receptor blocker is being taken.  Anxiety Presents for follow-up visit. Symptoms include excessive worry and nervous/anxious behavior.   Past treatments include benzodiazephines and SSRIs. Compliance with prior treatments has been good.   Past Medical History  Diagnosis Date  . Coronary artery disease   . Hypertension   . Arthritis     rheumatoid   . GERD (gastroesophageal reflux disease)   . Urgency of urination   . Diabetes mellitus without complication ([place])   . Sleeping difficulty     takes med nightly  . Depression   . Localized osteoarthritis of right knee 05/21/2012  . Reflux     Past Surgical History  Procedure Laterality Date  . Foot surgery      x2  . Back surgery    . Coronary artery bypass graft  2004    x4 vessels  . Foot surgery      x2  . Transurethral resection of prostate    . 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 Kidney disease Neg Hx   . Bladder Cancer Brother   . Prostate cancer Neg Hx     Social History   Social History  . Marital Status: Widowed    Spouse Name:  N/A  . Number of Children: N/A  . Years of Education: N/A   Occupational History  . Not on file.   Social History Main Topics  . Smoking status: Former Smoker    Quit date: 05/14/1992  . Smokeless tobacco: Not on file  . Alcohol Use: 0.0 oz/week    0 Standard drinks or equivalent per week     Comment: moderatly  . Drug Use: No  . Sexual Activity: Not on file   Other Topics Concern  . Not on file   Social History Narrative    Current outpatient prescriptions:  .  Ascorbic Acid (VITAMIN C) 1000 MG tablet, Take 2,000 mg by mouth daily., Disp: , Rfl:  .  aspirin EC 81 MG tablet, Take 81 mg by mouth daily., Disp: , Rfl:  .  buPROPion (WELLBUTRIN XL) 300 MG 24 hr tablet, TAKE 1 TABLET DAILY, Disp: 90 tablet, Rfl: 0 .  busPIRone (BUSPAR) 15 MG tablet, TAKE 1 TABLET TWICE A DAY, Disp: 180 tablet, Rfl: 0 .  Cholecalciferol (VITAMIN D) 2000 UNITS tablet, Take 2,000 Units by mouth daily., Disp: , Rfl:  .  clonazePAM (KLONOPIN) 0.5 MG tablet, Take 1 tablet (0.5 mg total) by mouth 2 (two) times daily as needed. anxiety, Disp: 60 tablet, Rfl: 0 .  Coenzyme Q10 (CO Q 10) 100 MG  CAPS, Take 1 capsule by mouth daily., Disp: , Rfl:  .  fish oil-omega-3 fatty acids 1000 MG capsule, Take 2 g by mouth daily., Disp: , Rfl:  .  fluticasone (FLONASE) 50 MCG/ACT nasal spray, Place 2 sprays into the nose daily., Disp: , Rfl:  .  irbesartan (AVAPRO) 150 MG tablet, Take 1 tablet (150 mg total) by mouth daily., Disp: 90 tablet, Rfl: 0 .  metFORMIN (GLUCOPHAGE) 1000 MG tablet, Take 1 tablet (1,000 mg total) by mouth 2 (two) times daily with a meal., Disp: 180 tablet, Rfl: 0 .  Multiple Vitamin (MULTIVITAMIN WITH MINERALS) TABS, Take 1 tablet by mouth daily., Disp: , Rfl:  .  Multiple Vitamins-Calcium (CALCI-MAX PO), Take 1 tablet by mouth daily., Disp: , Rfl:  .  pantoprazole (PROTONIX) 40 MG tablet, Take 1 tablet (40 mg total) by mouth daily., Disp: 90 tablet, Rfl: 0 .  solifenacin (VESICARE) 10 MG tablet,  Take 1 tablet (10 mg total) by mouth daily., Disp: 90 tablet, Rfl: 3 .  Suvorexant (BELSOMRA) 10 MG TABS, Take 1 tablet by mouth at bedtime as needed., Disp: 90 tablet, Rfl: 0 .  VYTORIN 10-20 MG tablet, TAKE 1 TABLET DAILY AT 6 P.M, Disp: 90 tablet, Rfl: 0  No Known Allergies  Review of Systems  Constitutional: Negative for weight loss and fatigue.  Gastrointestinal: Negative for abdominal pain.  Endo/Heme/Allergies: Negative for polydipsia.  Psychiatric/Behavioral: The patient is nervous/anxious.    Objective  Filed Vitals:   02/12/15 0833  BP: 140/75  Pulse: 82  Temp: 97.6 F (36.4 C)  TempSrc: Oral  Resp: 18  Height: 6\' 2"  (1.88 m)  Weight: 271 lb (122.925 kg)  SpO2: 97%   Physical Exam  Constitutional: He is oriented to person, place, and time and well-developed, well-nourished, and in no distress.  Cardiovascular: Normal rate and regular rhythm.   No murmur heard. Pulmonary/Chest: Effort normal and breath sounds normal. He has no wheezes. He has no rales.  Abdominal: Soft. Bowel sounds are normal. There is no tenderness.  Neurological: He is alert and oriented to person, place, and time.  Psychiatric: Memory, affect and judgment normal.  Nursing note and vitals reviewed.   Assessment & Plan  1. Generalized anxiety disorder Symptoms are stable on clonazepam taken twice daily as needed. Patient aware of the dependence potential. Refills provided. - clonazePAM (KLONOPIN) 0.5 MG tablet; Take 1 tablet (0.5 mg total) by mouth 2 (two) times daily as needed. anxiety  Dispense: 180 tablet; Refill: 0  2. Type 2 diabetes mellitus without complication, unspecified long term insulin use status (HCC) A1c at goal. No change in pharmacotherapy. Recheck in 3-4 months. - POCT HgB A1C - POCT Glucose (CBG)   Kerrie Latour [facility] 02/12/2015 8:47 AM

## 2015-02-17 ENCOUNTER — Encounter: Payer: Self-pay | Admitting: Urology

## 2015-02-17 ENCOUNTER — Ambulatory Visit (INDEPENDENT_AMBULATORY_CARE_PROVIDER_SITE_OTHER): Payer: Medicare Other | Admitting: Urology

## 2015-02-17 ENCOUNTER — Ambulatory Visit: Payer: Medicare Other | Admitting: Urology

## 2015-02-17 VITALS — BP 138/72 | HR 61 | Ht 74.0 in | Wt 272.8 lb

## 2015-02-17 DIAGNOSIS — R3915 Urgency of urination: Secondary | ICD-10-CM | POA: Diagnosis not present

## 2015-02-17 NOTE — Progress Notes (Signed)
PTNS  Session # 11  Health & Social Factors: no change Caffeine: 2 Alcohol: 1 Daytime voids #per day: 10-15 Night-time voids #per night: 1-2 Urgency: nild Incontinence Episodes #per day: 1 Ankle used: right Treatment Setting: 2 Feeling/ Response: both Comments: Thing 2 (pink)  Preformed By: Zara Council, PAC  Assistant: Fonnie Jarvis, CMA  Follow Up: 1 week

## 2015-02-22 NOTE — Progress Notes (Signed)
Chief Complaint:  Chief Complaint  Patient presents with  . PTNS     HPI: Patient is a 74 year old white male who presents today for PTNS treatment for urinary urgency. This is the # 11 treatment in a series of 12  Background history Seth Mcintosh is a 74 year old white male with a long-standing history of urinary frequency, nocturia and urge incontinence. Seth Mcintosh does not recall specific event that led to the urinary symptoms.  His amount of leakage is variable. Seth Mcintosh wears depends when Seth Mcintosh goes on long car trips. Seth Mcintosh will have an occasional leakage when Seth Mcintosh is not driving, but it is a scant amount of urine.  Seth Mcintosh does not lose urine if Seth Mcintosh coughs, sneezes or laughs. Seth Mcintosh also does not lose urine if Seth Mcintosh changing position or bending or lifting.        After his last treatment, Seth Mcintosh states Seth Mcintosh is having 10-15 (unchanged) trips to the bathroom during the day to void. Seth Mcintosh is getting up 1-2 ( unchanged) times a night to void as well. Seth Mcintosh is not experiencing any pain or burning with urination. Seth Mcintosh has a strong urinary urgency. Seth Mcintosh is experiencing 1 (worsening) incontinent episodes daily.     Seth Mcintosh is consuming 2 caffeinated beverages daily, occasional alcohol consumption and does not drink copious amount of water during the day.      Previous Therapy: Patient had been evaluated and treated through an urologist in [place]. Seth Mcintosh has tried Dispensing optician, Chartered certified accountant, Advertising copywriter and Vesicare in the past. All with mixed results. With our office Seth Mcintosh had a trial of Myrbetriq, which Seth Mcintosh failed.  Seth Mcintosh was given a trial of Vesicare 10 mg daily. Seth Mcintosh found a great relief with this medication, but his insurance would not cover the medication.  Blood pressure 138/72, pulse 61, height 6\' 2"  (1.88 m), weight 272 lb 12.8 oz (123.741 kg).  Seth Mcintosh does not have any contraindications present for PTNS, such as: Pacemaker, Implantable defibrillator, History of abnormal bleeding or History of neuropathies or nerve damage.  Discussed with patient possible complications of  procedure, such as discomfort, bleeding at insertion/stimulation site, procedure consent signed  Patient goals: His goals are to reduce urinary urgency and frequency.  PTNS treatment: The needle electrode was inserted into the lower, inner aspect of the patient's right leg. The surface electrode was placed on the inside arch of the foot on the treatment leg. The lead set was connected to the stimulator, and the needle electrode clip was connected to the needle electrode. The stimulator that produces an adjustable electrical pulse that travels to the sacral nerve plexus via the tibial nerve was increased to 2 and  the patient received a sensory response.  Treatment Plan:  Patient tolerated the PTNS treatment well for 30 minutes. The electrode was removed with out difficulty. Seth Mcintosh will return in 1 week for his 12th  PTNS treatment.

## 2015-02-23 DIAGNOSIS — M79671 Pain in right foot: Secondary | ICD-10-CM | POA: Diagnosis not present

## 2015-02-23 DIAGNOSIS — Z23 Encounter for immunization: Secondary | ICD-10-CM | POA: Diagnosis not present

## 2015-02-24 ENCOUNTER — Other Ambulatory Visit: Payer: Self-pay | Admitting: Family Medicine

## 2015-02-24 ENCOUNTER — Ambulatory Visit (INDEPENDENT_AMBULATORY_CARE_PROVIDER_SITE_OTHER): Payer: Medicare Other | Admitting: Urology

## 2015-02-24 ENCOUNTER — Ambulatory Visit: Payer: Medicare Other | Admitting: Urology

## 2015-02-24 ENCOUNTER — Encounter: Payer: Self-pay | Admitting: Urology

## 2015-02-24 VITALS — BP 119/73 | HR 57 | Ht 74.0 in | Wt 270.6 lb

## 2015-02-24 DIAGNOSIS — R3915 Urgency of urination: Secondary | ICD-10-CM | POA: Diagnosis not present

## 2015-02-24 DIAGNOSIS — F5104 Psychophysiologic insomnia: Secondary | ICD-10-CM

## 2015-02-24 LAB — PTNS-PERCUTANEOUS TIBIAL NERVE STIMULATION: SCAN RESULT: 19

## 2015-02-24 MED ORDER — SUVOREXANT 10 MG PO TABS: 1.0000 | ORAL_TABLET | Freq: Every evening | ORAL | Status: DC | PRN

## 2015-02-24 NOTE — Telephone Encounter (Signed)
Routed to Dr. Shah for approval 

## 2015-02-24 NOTE — Telephone Encounter (Signed)
Patient is requesting a refill on Suvorexant ([employer]) 10 MG TABS [[id number]. Please call once complete. Patient has 2 left.

## 2015-02-25 NOTE — Progress Notes (Signed)
Chief Complaint:  Chief Complaint  Patient presents with  . Urinary Urgency    PTNS      HPI: Patient is a 74 year old white male who presents today for PTNS treatment for urinary urgency. This is the # 12 treatment in a series of 12  Background history Seth Mcintosh is a 74 year old white male with a long-standing history of urinary frequency, nocturia and urge incontinence. He does not recall specific event that led to the urinary symptoms.  His amount of leakage is variable. He wears depends when he goes on long car trips. He will have an occasional leakage when he is not driving, but it is a scant amount of urine.  He does not lose urine if he coughs, sneezes or laughs. He also does not lose urine if he changing position or bending or lifting.        After his last treatment, he states he is having 12 (unchanged) trips to the bathroom during the day to void. He is getting up 2 ( unchanged) times a night to void as well. He is not experiencing any pain or burning with urination. He has a strong urinary urgency. He is experiencing 1 (unchanged) incontinent episodes daily.     He is consuming 2 caffeinated beverages daily, occasional alcohol consumption and does not drink copious amount of water during the day.      Previous Therapy: Patient had been evaluated and treated through an urologist in [place]. He has tried Dispensing optician, Chartered certified accountant, Advertising copywriter and Vesicare in the past. All with mixed results. With our office he had a trial of Myrbetriq, which he failed.  He was given a trial of Vesicare 10 mg daily. He found a great relief with this medication, but his insurance would not cover the medication.  Blood pressure 138/72, pulse 61, height 6\' 2"  (1.88 m), weight 272 lb 12.8 oz (123.741 kg).  He does not have any contraindications present for PTNS, such as: Pacemaker, Implantable defibrillator, History of abnormal bleeding or History of neuropathies or nerve damage.  Discussed with patient possible  complications of procedure, such as discomfort, bleeding at insertion/stimulation site, procedure consent signed  Patient goals: His goals are to reduce urinary urgency and frequency.  PTNS treatment: The needle electrode was inserted into the lower, inner aspect of the patient's right leg. The surface electrode was placed on the inside arch of the foot on the treatment leg. The lead set was connected to the stimulator, and the needle electrode clip was connected to the needle electrode. The stimulator that produces an adjustable electrical pulse that travels to the sacral nerve plexus via the tibial nerve was increased to 19 and  the patient received a sensory response and toe response.   Treatment Plan:  Patient tolerated the PTNS treatment well for 30 minutes. The electrode was removed with out difficulty. He will return in 1 month for his maintenance  PTNS treatment.

## 2015-03-01 ENCOUNTER — Other Ambulatory Visit: Payer: Self-pay

## 2015-03-01 DIAGNOSIS — F5104 Psychophysiologic insomnia: Secondary | ICD-10-CM

## 2015-03-01 MED ORDER — SUVOREXANT 10 MG PO TABS: 1.0000 | ORAL_TABLET | Freq: Every evening | ORAL | Status: DC | PRN

## 2015-03-01 NOTE — Telephone Encounter (Signed)
Patient called and stated that [facility] has not received his  rx for Suvorexant (Belsomra) 10mg  and that he could not get it from his local pharmacy b/c it is too expensive.   Refill request was sent to Dr. Roselee Nova for approval and submission.

## 2015-03-03 ENCOUNTER — Telehealth: Payer: Self-pay | Admitting: Family Medicine

## 2015-03-03 DIAGNOSIS — E782 Mixed hyperlipidemia: Secondary | ICD-10-CM | POA: Diagnosis not present

## 2015-03-03 DIAGNOSIS — I10 Essential (primary) hypertension: Secondary | ICD-10-CM | POA: Diagnosis not present

## 2015-03-03 DIAGNOSIS — I25810 Atherosclerosis of coronary artery bypass graft(s) without angina pectoris: Secondary | ICD-10-CM | POA: Diagnosis not present

## 2015-03-03 DIAGNOSIS — E119 Type 2 diabetes mellitus without complications: Secondary | ICD-10-CM | POA: Diagnosis not present

## 2015-03-03 NOTE — Telephone Encounter (Signed)
BELSOMRA: states that only a month supply was sent to his mail order pharmacy and it should have been #180 pills (90 day supply). Please resend.

## 2015-03-03 NOTE — Telephone Encounter (Signed)
Medication has been refilled and sent to Pharmacy he has a 90 day supply

## 2015-03-24 ENCOUNTER — Ambulatory Visit: Payer: Medicare Other | Admitting: Urology

## 2015-03-24 ENCOUNTER — Encounter: Payer: Self-pay | Admitting: Urology

## 2015-03-24 ENCOUNTER — Ambulatory Visit (INDEPENDENT_AMBULATORY_CARE_PROVIDER_SITE_OTHER): Payer: Medicare Other | Admitting: Urology

## 2015-03-24 VITALS — BP 127/78 | HR 70 | Ht 74.0 in | Wt 275.8 lb

## 2015-03-24 DIAGNOSIS — R3915 Urgency of urination: Secondary | ICD-10-CM

## 2015-03-24 LAB — PTNS-PERCUTANEOUS TIBIAL NERVE STIMULATION: SCAN RESULT: 17

## 2015-03-28 NOTE — Progress Notes (Signed)
Chief Complaint:  Chief Complaint  Patient presents with  . Urinary Urgency    PTNS     HPI: Patient is a 74 year old white male who presents today for PTNS treatment for urinary urgency. This is a monthly maintenance treatment  Background history Mr. Brain is a 74 year old white male with a long-standing history of urinary frequency, nocturia and urge incontinence. He does not recall specific event that led to the urinary symptoms.  His amount of leakage is variable. He wears depends when he goes on long car trips. He will have an occasional leakage when he is not driving, but it is a scant amount of urine.  He does not lose urine if he coughs, sneezes or laughs. He also does not lose urine if he changing position or bending or lifting.        After his last treatment, he states he is having 12 (unchanged) trips to the bathroom during the day to void. He is getting up 2 ( unchanged) times a night to void as well. He is not experiencing any pain or burning with urination. He has a strong urinary urgency. He is experiencing 1 (unchanged) incontinent episodes daily.     He is consuming 2 caffeinated beverages daily, occasional alcohol consumption and does not drink copious amount of water during the day.      Previous Therapy: Patient had been evaluated and treated through an urologist in [place]. He has tried Dispensing optician, Chartered certified accountant, Advertising copywriter and Vesicare in the past. All with mixed results. With our office he had a trial of Myrbetriq, which he failed.  He was given a trial of Vesicare 10 mg daily. He found a great relief with this medication, but his insurance would not cover the medication.  Blood pressure 127/78, pulse 70, height 6\' 2"  (1.88 m), weight 275 lb 12.8 oz (125.102 kg).  He does not have any contraindications present for PTNS, such as: Pacemaker, Implantable defibrillator, History of abnormal bleeding or History of neuropathies or nerve damage.  Discussed with patient possible  complications of procedure, such as discomfort, bleeding at insertion/stimulation site, procedure consent signed  Patient goals: His goals are to reduce urinary urgency and frequency.  PTNS treatment: The needle electrode was inserted into the lower, inner aspect of the patient's right leg. The surface electrode was placed on the inside arch of the foot on the treatment leg. The lead set was connected to the stimulator, and the needle electrode clip was connected to the needle electrode. The stimulator that produces an adjustable electrical pulse that travels to the sacral nerve plexus via the tibial nerve was increased to 17 and  the patient received a sensory response.   Treatment Plan:  Patient tolerated the PTNS treatment well for 30 minutes. The electrode was removed with out difficulty. He will return in 1 month for his next maintenance  PTNS treatment.

## 2015-04-11 ENCOUNTER — Other Ambulatory Visit: Payer: Self-pay | Admitting: Family Medicine

## 2015-04-13 ENCOUNTER — Other Ambulatory Visit: Payer: Self-pay | Admitting: Family Medicine

## 2015-04-14 NOTE — Telephone Encounter (Signed)
Medication has been refilled and [facility] 

## 2015-04-14 NOTE — Telephone Encounter (Signed)
Med[facility] has [facility]

## 2015-04-20 ENCOUNTER — Other Ambulatory Visit: Payer: Self-pay | Admitting: Family Medicine

## 2015-04-22 DIAGNOSIS — J069 Acute upper respiratory infection, unspecified: Secondary | ICD-10-CM | POA: Diagnosis not present

## 2015-04-28 ENCOUNTER — Ambulatory Visit: Payer: Medicare Other | Admitting: Urology

## 2015-05-05 ENCOUNTER — Ambulatory Visit (INDEPENDENT_AMBULATORY_CARE_PROVIDER_SITE_OTHER): Payer: Medicare Other | Admitting: Urology

## 2015-05-05 ENCOUNTER — Encounter: Payer: Self-pay | Admitting: Urology

## 2015-05-05 VITALS — BP 109/69 | HR 76 | Ht 74.5 in | Wt 274.9 lb

## 2015-05-05 DIAGNOSIS — N401 Enlarged prostate with lower urinary tract symptoms: Secondary | ICD-10-CM | POA: Diagnosis not present

## 2015-05-05 DIAGNOSIS — R3915 Urgency of urination: Secondary | ICD-10-CM

## 2015-05-05 DIAGNOSIS — N138 Other obstructive and reflux uropathy: Secondary | ICD-10-CM

## 2015-05-05 LAB — PTNS-PERCUTANEOUS TIBIAL NERVE STIMULATION: SCAN RESULT: 19

## 2015-05-05 NOTE — Progress Notes (Signed)
Chief Complaint:  Chief Complaint  Patient presents with  . Urinary Urgency    PTNS     HPI: Patient is a 75 year old white male who presents today for PTNS treatment for urinary urgency. This is a monthly maintenance treatment  Background history Mr. Fullard is a 75 year old white male with a long-standing history of urinary frequency, nocturia and urge incontinence. He does not recall specific event that led to the urinary symptoms.  His amount of leakage is variable. He wears depends when he goes on long car trips. He will have an occasional leakage when he is not driving, but it is a scant amount of urine.  He does not lose urine if he coughs, sneezes or laughs. He also does not lose urine if he changing position or bending or lifting.        After his last treatment, he states he is having 12 (unchanged) trips to the bathroom during the day to void. He is getting up 3 (worse) times a night to void as well. He is not experiencing any pain or burning with urination. He has a strong urinary urgency. He is experiencing 1 (unchanged) incontinent episodes daily.     He is consuming 2 caffeinated beverages daily, occasional alcohol consumption and does not drink copious amount of water during the day.      Previous Therapy: Patient had been evaluated and treated through an urologist in [place]. He has tried Dispensing optician, Chartered certified accountant, Advertising copywriter and Vesicare in the past. All with mixed results. With our office he had a trial of Myrbetriq, which he failed.  He was given a trial of Vesicare 10 mg daily. He found a great relief with this medication, but his insurance would not cover the medication.  Blood pressure 109/69, pulse 76, height 6' 2.5" (1.892 m), weight 274 lb 14.4 oz (124.694 kg).  He does not have any contraindications present for PTNS, such as: Pacemaker, Implantable defibrillator, History of abnormal bleeding or History of neuropathies or nerve damage.  Discussed with patient possible  complications of procedure, such as discomfort, bleeding at insertion/stimulation site, procedure consent signed  Patient goals: His goals are to reduce urinary urgency and frequency.  PTNS treatment: The needle electrode was inserted into the lower, inner aspect of the patient's right leg. The surface electrode was placed on the inside arch of the foot on the treatment leg. The lead set was connected to the stimulator, and the needle electrode clip was connected to the needle electrode. The stimulator that produces an adjustable electrical pulse that travels to the sacral nerve plexus via the tibial nerve was increased to 17 and  the patient received a sensory response.   Treatment Plan:  Patient tolerated the PTNS treatment well for 30 minutes. The electrode was removed with out difficulty. He will return in 1 month for his next maintenance  PTNS treatment.     Patient will need an office visit for his annual visit.  PSA is drawn today.  He will return next week for IPSS score, PVR and exam.

## 2015-05-06 LAB — PSA: PROSTATE SPECIFIC AG, SERUM: 0.7 ng/mL (ref 0.0–4.0)

## 2015-05-07 ENCOUNTER — Other Ambulatory Visit: Payer: Self-pay | Admitting: Family Medicine

## 2015-05-13 ENCOUNTER — Ambulatory Visit (INDEPENDENT_AMBULATORY_CARE_PROVIDER_SITE_OTHER): Payer: Medicare Other | Admitting: Family Medicine

## 2015-05-13 VITALS — BP 118/72 | HR 77 | Temp 98.0°F | Resp 16 | Ht 75.0 in | Wt 277.8 lb

## 2015-05-13 DIAGNOSIS — F5104 Psychophysiologic insomnia: Secondary | ICD-10-CM

## 2015-05-13 DIAGNOSIS — N529 Male erectile dysfunction, unspecified: Secondary | ICD-10-CM | POA: Diagnosis not present

## 2015-05-13 DIAGNOSIS — G4700 Insomnia, unspecified: Secondary | ICD-10-CM

## 2015-05-13 MED ORDER — SUVOREXANT 10 MG PO TABS: 1.0000 | ORAL_TABLET | Freq: Every evening | ORAL | Status: DC | PRN

## 2015-05-13 MED ORDER — SILDENAFIL CITRATE 50 MG PO TABS: 50.0000 mg | ORAL_TABLET | Freq: Every day | ORAL | Status: AC | PRN

## 2015-05-13 MED ORDER — SILDENAFIL CITRATE 50 MG PO TABS: 50.0000 mg | ORAL_TABLET | Freq: Every day | ORAL | Status: DC | PRN

## 2015-05-13 NOTE — Progress Notes (Signed)
Name: Seth FIEGEL Sr.   MRN: [id number]    DOB: September 23, 1940   Date:05/13/2015       Progress Note  Subjective  Chief Complaint  Chief Complaint  Patient presents with  . Medication Refill    follow-up  . Diabetes    checks glucose 1x per week   . Hypertension  . Hyperlipidemia  . Insomnia  . Depression  . Gastroesophageal Reflux    Insomnia Primary symptoms: sleep disturbance, difficulty falling asleep.  The problem has been gradually improving since onset. The symptoms are aggravated by anxiety. How many beverages per day that contain caffeine: 2-3.  The symptoms are relieved by medication (Belsomra 10 mg at bedtime). Past treatments include medication. Typical bedtime:  11-12 P.M..  How long after going to bed to you fall asleep: 15-30 minutes.   Sleep duration: occasionally takes naps during the day.  PMH includes: hypertension, depression, family stress or anxiety.  Erectile Dysfunction This is a chronic problem. The nature of his difficulty is achieving erection. He is on Vesicare for Overactive Bladder. Obstructive symptoms do not include dribbling, incomplete emptying, an intermittent stream or a weak stream. Pertinent negatives include no chills, dysuria or hematuria. Past treatments include sildenafil (Has been on Viagra 50 mg in the past by Dr. Jacqualine Code.). The treatment provided moderate relief. He has had abnormal vision, dyspnea, headaches and nasal congestion caused by medications. Risk factors include hypertension and diabetes mellitus.    Past Medical History  Diagnosis Date  . Coronary artery disease   . Hypertension   . Arthritis     rheumatoid   . GERD (gastroesophageal reflux disease)   . Urgency of urination   . Diabetes mellitus without complication ([place])   . Sleeping difficulty     takes med nightly  . Depression   . Localized osteoarthritis of right knee 05/21/2012  . Reflux     Past Surgical History  Procedure Laterality Date  . Foot surgery     x2  . Back surgery    . Coronary artery bypass graft  2004    x4 vessels  . Foot surgery      x2  . Transurethral resection of prostate    . 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 Kidney disease Neg Hx   . Bladder Cancer Brother   . Prostate cancer Neg Hx     Social History   Social History  . Marital Status: Widowed    Spouse Name: N/A  . Number of Children: N/A  . Years of Education: N/A   Occupational History  . Not on file.   Social History Main Topics  . Smoking status: Former Smoker    Quit date: 05/14/1992  . Smokeless tobacco: Not on file  . Alcohol Use: 0.0 oz/week    0 Standard drinks or equivalent per week     Comment: moderatly  . Drug Use: No  . Sexual Activity: Not on file   Other Topics Concern  . Not on file   Social History Narrative     Current outpatient prescriptions:  .  Ascorbic Acid (VITAMIN C) 1000 MG tablet, Take 2,000 mg by mouth daily., Disp: , Rfl:  .  aspirin EC 81 MG tablet, Take 81 mg by mouth daily., Disp: , Rfl:  .  benzonatate (TESSALON) 200 MG capsule, Reported on 05/05/2015, Disp: , Rfl: 0 .  buPROPion (WELLBUTRIN XL) 300 MG 24 hr tablet, TAKE 1 TABLET DAILY, Disp: 90 tablet, Rfl: 3 .  busPIRone (BUSPAR) 15 MG tablet, TAKE 1 TABLET TWICE A DAY, Disp: 180 tablet, Rfl: 3 .  Cholecalciferol (VITAMIN D) 2000 UNITS tablet, Take 2,000 Units by mouth daily., Disp: , Rfl:  .  clonazePAM (KLONOPIN) 0.5 MG tablet, Take 1 tablet (0.5 mg total) by mouth 2 (two) times daily as needed. anxiety, Disp: 180 tablet, Rfl: 0 .  Coenzyme Q10 (CO Q 10) 100 MG CAPS, Take 1 capsule by mouth daily., Disp: , Rfl:  .  fish oil-omega-3 fatty acids 1000 MG capsule, Take 2 g by mouth daily., Disp: , Rfl:  .  fluticasone (FLONASE) 50 MCG/ACT nasal spray, Place 2 sprays into the  nose daily., Disp: , Rfl:  .  irbesartan (AVAPRO) 150 MG tablet, Take 1 tablet (150 mg total) by mouth daily., Disp: 90 tablet, Rfl: 0 .  metFORMIN (GLUCOPHAGE) 1000 MG tablet, TAKE 1 TABLET TWICE A DAY WITH MEALS, Disp: 180 tablet, Rfl: 3 .  Multiple Vitamin (MULTIVITAMIN WITH MINERALS) TABS, Take 1 tablet by mouth daily., Disp: , Rfl:  .  Multiple Vitamins-Calcium (CALCI-MAX PO), Take 1 tablet by mouth daily. Reported on 05/05/2015, Disp: , Rfl:  .  pantoprazole (PROTONIX) 40 MG tablet, Take 1 tablet (40 mg total) by mouth daily., Disp: 90 tablet, Rfl: 0 .  solifenacin (VESICARE) 10 MG tablet, Take 1 tablet (10 mg total) by mouth daily., Disp: 90 tablet, Rfl: 3 .  Suvorexant (BELSOMRA) 10 MG TABS, Take 1 tablet by mouth at bedtime as needed., Disp: 30 tablet, Rfl: 2 .  VYTORIN 10-20 MG tablet, TAKE 1 TABLET DAILY AT 6 P.M, Disp: 90 tablet, Rfl: 3  No Known Allergies   Review of Systems  Constitutional: Negative for chills.       For detailed ROS, please see History and Physical portion of the note.  Genitourinary: Negative for dysuria, hematuria and incomplete emptying.  Psychiatric/Behavioral: Positive for depression and sleep disturbance. The patient has insomnia.     Objective  Filed Vitals:   05/13/15 1043  BP: 118/72  Pulse: 77  Temp: 98 F (36.7 C)  TempSrc: Oral  Resp: 16  Height: 6\' 3"  (1.905 m)  Weight: 277 lb 12.8 oz (126.009 kg)  SpO2: 96%    Physical Exam  Constitutional: He is oriented to person, place, and time and well-developed, well-nourished, and in no distress.  HENT:  Head: Normocephalic and atraumatic.  Cardiovascular: Normal rate and regular rhythm.   No murmur heard. Pulmonary/Chest: Effort normal and breath sounds normal. He has no wheezes.  Abdominal: Soft. Bowel sounds are normal. There is no tenderness.  Genitourinary: Testes/scrotum normal and penis normal. No discharge found.  Foreign body palpated on the lateral right side of penile shaft,  pt. Reports having an implanted device for difficulty with erection in the past  Neurological: He is alert and oriented to person, place, and time.  Psychiatric: Mood, memory, affect and judgment normal.  Nursing note and vitals reviewed.    Assessment & Plan  1. Chronic insomnia Stable and responsive to Belsomra taken every night. Refills provided to - Suvorexant (BELSOMRA) 10 MG TABS; Take 1 tablet by mouth at bedtime as needed.  Dispense: 30 tablet; Refill: 2  2. Erectile dysfunction, unspecified erectile dysfunction type Patient apparently has a penile implant, which was inserted for erectile dysfunction. Has  taken Viagra in the past for erectile dysfunction. I advised the patient to first check with urologist if it is okay for him to take Viagra and he verbalized agreement. Prescription provided. Follow-up in one month. - sildenafil (VIAGRA) 50 MG tablet; Take 1 tablet (50 mg total) by mouth daily as needed for erectile dysfunction. 1 tablet po 30 mins prior to sexual activity  Dispense: 10 tablet; Refill: 0   Estanislao Harmon [facility] 05/13/2015 11:14 AM

## 2015-05-14 ENCOUNTER — Encounter: Payer: Self-pay | Admitting: Urology

## 2015-05-14 ENCOUNTER — Ambulatory Visit (INDEPENDENT_AMBULATORY_CARE_PROVIDER_SITE_OTHER): Payer: Medicare Other | Admitting: Urology

## 2015-05-14 VITALS — BP 144/73 | HR 69 | Ht 75.0 in | Wt 275.1 lb

## 2015-05-14 DIAGNOSIS — R3915 Urgency of urination: Secondary | ICD-10-CM

## 2015-05-14 DIAGNOSIS — N529 Male erectile dysfunction, unspecified: Secondary | ICD-10-CM

## 2015-05-14 DIAGNOSIS — N138 Other obstructive and reflux uropathy: Secondary | ICD-10-CM

## 2015-05-14 DIAGNOSIS — N401 Enlarged prostate with lower urinary tract symptoms: Secondary | ICD-10-CM | POA: Diagnosis not present

## 2015-05-14 DIAGNOSIS — N528 Other male erectile dysfunction: Secondary | ICD-10-CM | POA: Diagnosis not present

## 2015-05-14 LAB — BLADDER SCAN AMB NON-IMAGING: SCAN RESULT: 75

## 2015-05-14 NOTE — Progress Notes (Signed)
05/14/2015 1:34 PM   Seth Mulberry Sr. 11/29/1940 [id number]  Referring provider: Roselee Nova, MD [address] [place], [place] [address]  Chief Complaint  Patient presents with  . Benign Prostatic Hypertrophy    follow up  . Urinary Urgency    HPI: Patient is 75 year old Caucasian male with a history of urinary urgency, erectile dysfunction and BPH with LUTS who presents today for 1 year follow-up.  Urinary urgency Patient is currently in the maintenance phase of his PTNS treatments.  He has stated he feels that he has had improvement with the PTNS, but he is still taking Vesicare 10 mg daily.  His PVR today is 75 mL.    Erectile dysfunction Patient underwent a penile prosthesis placement on 06/08/2014 with Dr. Rick Duff. He has mixed feeling with the penile prosthesis.  He states the skin on the penis is numb and he does not have the sensation he has had in the past.  It is still functioning for him normally.  BPH WITH LUTS His IPSS score today is 7, which is mild lower urinary tract symptomatology. He is mixed with his quality life due to his urinary symptoms. His PVR is 75 mL.  His major complaint today is urinary urgency and nocturia.  He has had these symptoms for over three years.  He denies any dysuria, hematuria or suprapubic pain.  He currently taking Vesicare 10 mg daily.  His has had a TURP in the late 1990's and a cystoscopy on 09/01/2013 which did not demonstrate any BOO.   He also denies any recent fevers, chills, nausea or vomiting.  He does not have a family history of PCa.      IPSS      05/14/15 1000       International Prostate Symptom Score   How often have you had the sensation of not emptying your bladder? Less than 1 in 5     How often have you had to urinate less than every two hours? Less than half the time     How often have you found you stopped and started again several times when you urinated? Not at All     How often  have you found it difficult to postpone urination? Less than 1 in 5 times     How often have you had a weak urinary stream? Less than 1 in 5 times     How often have you had to strain to start urination? Not at All     How many times did you typically get up at night to urinate? 2 Times     Total IPSS Score 7     Quality of Life due to urinary symptoms   If you were to spend the rest of your life with your urinary condition just the way it is now how would you feel about that? Mixed        Score:  1-7 Mild 8-19 Moderate 20-35 Severe     PMH: Past Medical History  Diagnosis Date  . Coronary artery disease   . Hypertension   . Arthritis     rheumatoid   . GERD (gastroesophageal reflux disease)   . Urgency of urination   . Diabetes mellitus without complication ([place])   . Sleeping difficulty     takes med nightly  . Depression   . Localized osteoarthritis of right knee 05/21/2012  . Reflux     Surgical History: Past Surgical  History  Procedure Laterality Date  . Foot surgery      x2  . Back surgery    . Coronary artery bypass graft  2004    x4 vessels  . Foot surgery      x2  . Transurethral resection of prostate    . 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  Always use your most recent med list.               aspirin EC 81 MG tablet  Take 81 mg by mouth daily.     benzonatate 200 MG capsule  Commonly known as:  TESSALON  Reported on 05/14/2015     buPROPion 300 MG 24 hr tablet  Commonly known as:  WELLBUTRIN XL  TAKE 1 TABLET DAILY     busPIRone 15 MG tablet  Commonly known as:  BUSPAR  TAKE 1 TABLET TWICE A DAY     CALCI-MAX PO  Take 1 tablet by mouth daily. Reported on 05/14/2015     clonazePAM 0.5 MG tablet  Commonly known  as:  KLONOPIN  Take 1 tablet (0.5 mg total) by mouth 2 (two) times daily as needed. anxiety     Co Q 10 100 MG Caps  Take 1 capsule by mouth daily.     fish oil-omega-3 fatty acids 1000 MG capsule  Take 2 g by mouth daily.     fluticasone 50 MCG/ACT nasal spray  Commonly known as:  FLONASE  Place 2 sprays into the nose daily.     irbesartan 150 MG tablet  Commonly known as:  AVAPRO  Take 1 tablet (150 mg total) by mouth daily.     metFORMIN 1000 MG tablet  Commonly known as:  GLUCOPHAGE  TAKE 1 TABLET TWICE A DAY WITH MEALS     multivitamin with minerals Tabs tablet  Take 1 tablet by mouth daily.     pantoprazole 40 MG tablet  Commonly known as:  PROTONIX  Take 1 tablet (40 mg total) by mouth daily.     sildenafil 50 MG tablet  Commonly known as:  VIAGRA  Take 1 tablet (50 mg total) by mouth daily as needed for erectile dysfunction. 1 tablet po 30 mins prior to sexual activity     solifenacin 10 MG tablet  Commonly known as:  VESICARE  Take 1 tablet (10 mg total) by mouth daily.     Suvorexant 10 MG Tabs  Commonly known as:  BELSOMRA  Take 1 tablet by mouth at bedtime as needed.     vitamin C 1000 MG tablet  Take 2,000 mg by mouth daily.     Vitamin D 2000 units tablet  Take 2,000 Units by mouth daily.     VYTORIN 10-20 MG tablet  Generic drug:  ezetimibe-simvastatin  TAKE 1 TABLET DAILY AT 6 P.M        Allergies: No Known Allergies  Family History: Family History  Problem Relation Age of Onset  . Kidney disease Neg Hx   . Bladder Cancer Brother   . Prostate cancer Neg Hx     Social History:  reports that he quit smoking about 23 years ago. He does not have any smokeless tobacco history on file.  He reports that he drinks alcohol. He reports that he does not use illicit drugs.  ROS: UROLOGY Frequent Urination?: Yes Hard to postpone urination?: Yes Burning/pain with urination?: No Get up at night to urinate?: Yes Leakage of urine?: No Urine stream  starts and stops?: No Trouble starting stream?: No Do you have to strain to urinate?: No Blood in urine?: No Urinary tract infection?: No Sexually transmitted disease?: No Injury to kidneys or bladder?: No Painful intercourse?: No Weak stream?: No Erection problems?: No Penile pain?: No  Gastrointestinal Nausea?: No Vomiting?: No Indigestion/heartburn?: No Diarrhea?: No Constipation?: No  Constitutional Fever: No Night sweats?: No Weight loss?: No Fatigue?: No  Skin Skin rash/lesions?: No Itching?: No  Eyes Blurred vision?: No Double vision?: No  Ears/Nose/Throat Sore throat?: No Sinus problems?: No  Hematologic/Lymphatic Swollen glands?: No Easy bruising?: No  Cardiovascular Leg swelling?: No Chest pain?: No  Respiratory Cough?: No Shortness of breath?: No  Endocrine Excessive thirst?: No  Musculoskeletal Back pain?: No Joint pain?: No  Neurological Headaches?: No Dizziness?: No  Psychologic Depression?: No Anxiety?: No  Physical Exam: BP 144/73 mmHg  Pulse 69  Ht 6\' 3"  (1.905 m)  Wt 275 lb 1.6 oz (124.785 kg)  BMI 34.39 kg/m2  Constitutional: Well nourished. Alert and oriented, No acute distress. HEENT: [place] AT, moist mucus membranes. Trachea midline, no masses. Cardiovascular: No clubbing, cyanosis, or edema. Respiratory: Normal respiratory effort, no increased work of breathing. GI: Abdomen is soft, non tender, non distended, no abdominal masses. Liver and spleen not palpable.  No hernias appreciated.  Stool sample for occult testing is not indicated.   GU: No CVA tenderness.  No bladder fullness or masses.  Patient with circumcised phallus.   Urethral meatus is patent.  No penile discharge. No penile lesions or rashes. Prosthetic cylinders are palpated.  Scrotum without lesions, cysts, rashes and/or edema.  Prosthetic pump in the left scrotum.  Testicles are located scrotally bilaterally. No masses are appreciated in the testicles. Left  and right epididymis are normal. Rectal: Patient with  normal sphincter tone. Anus and perineum without scarring or rashes. No rectal masses are appreciated. Prostate is approximately 60 grams, no nodules are appreciated. Seminal vesicles are normal. Skin: No rashes, bruises or suspicious lesions. Lymph: No cervical or inguinal adenopathy. Neurologic: Grossly intact, no focal deficits, moving all 4 extremities. Psychiatric: Normal mood and affect.  Laboratory Data: Lab Results  Component Value Date   WBC 9.1 05/17/2015   HGB 15.2 05/17/2015   HCT 43.9 05/17/2015   MCV 94.4 05/17/2015   PLT 220 05/17/2015    Lab Results  Component Value Date   CREATININE 0.89 05/17/2015    PSA History  0.7 ng/mL on 05/05/2015   Lab Results  Component Value Date   TESTOSTERONE 409 11/03/2014    Lab Results  Component Value Date   HGBA1C 6.37 02/12/2015    Lab Results  Component Value Date   AST 20 11/11/2014   Lab Results  Component Value Date   ALT 37 11/11/2014    Pertinent Imaging: Results for Seth Mcintosh, Seth Mcintosh (MRN [id number]) as of 05/18/2015 13:16  Ref. Range 05/14/2015 10:41  Scan Result Unknown 75    Assessment & Plan:    1. Urinary urgency:   Patient is managing his urinary urgency with PTNS and Vesicare 10 mg daily.  He will RTC in one month for his maintenance PTNS.  2. Erectile dysfunction:   Patient underwent a penile prosthesis placement on 06/08/2014 with  Dr. Elnoria Howard. The prosthesis is still functioning, but he has loss of sensation in the penile skin. He has been prescribed Viagra by his primary care physician in hopes this may help improve the sensation.    3. BPH (benign prostatic hyperplasia) with LUTS:   IPSS score is 7/3.  His PVR is 75 mL.  He will continue to monitor.  He will repeat the IPSS score, exam and PSA in one year.    - BLADDER SCAN AMB NON-IMAGING   Return in about 1 year (around 05/13/2016) for IPSS score and exam.  These notes generated  with voice recognition software. I apologize for typographical errors.  Zara Council, [facility] [address] [place], [place] [address] [phone]

## 2015-05-17 ENCOUNTER — Emergency Department
Admission: EM | Admit: 2015-05-17 | Discharge: 2015-05-18 | Disposition: A | Discharge status: Home / Self Care | Payer: Medicare Other | Attending: Emergency Medicine | Admitting: Emergency Medicine

## 2015-05-17 ENCOUNTER — Ambulatory Visit: Payer: Medicare Other | Admitting: Family Medicine

## 2015-05-17 ENCOUNTER — Emergency Department: Payer: Medicare Other

## 2015-05-17 ENCOUNTER — Encounter: Payer: Self-pay | Admitting: *Deleted

## 2015-05-17 DIAGNOSIS — Y9389 Activity, other specified: Secondary | ICD-10-CM | POA: Insufficient documentation

## 2015-05-17 DIAGNOSIS — I10 Essential (primary) hypertension: Secondary | ICD-10-CM | POA: Insufficient documentation

## 2015-05-17 DIAGNOSIS — Z79899 Other long term (current) drug therapy: Secondary | ICD-10-CM | POA: Diagnosis not present

## 2015-05-17 DIAGNOSIS — Z7951 Long term (current) use of inhaled steroids: Secondary | ICD-10-CM | POA: Diagnosis not present

## 2015-05-17 DIAGNOSIS — W07XXXA Fall from chair, initial encounter: Secondary | ICD-10-CM | POA: Insufficient documentation

## 2015-05-17 DIAGNOSIS — S8991XA Unspecified injury of right lower leg, initial encounter: Secondary | ICD-10-CM | POA: Diagnosis not present

## 2015-05-17 DIAGNOSIS — Z792 Long term (current) use of antibiotics: Secondary | ICD-10-CM | POA: Diagnosis not present

## 2015-05-17 DIAGNOSIS — E119 Type 2 diabetes mellitus without complications: Secondary | ICD-10-CM | POA: Diagnosis not present

## 2015-05-17 DIAGNOSIS — Z7984 Long term (current) use of oral hypoglycemic drugs: Secondary | ICD-10-CM | POA: Diagnosis not present

## 2015-05-17 DIAGNOSIS — Z87891 Personal history of nicotine dependence: Secondary | ICD-10-CM | POA: Insufficient documentation

## 2015-05-17 DIAGNOSIS — Z7982 Long term (current) use of aspirin: Secondary | ICD-10-CM | POA: Diagnosis not present

## 2015-05-17 DIAGNOSIS — S8011XA Contusion of right lower leg, initial encounter: Secondary | ICD-10-CM | POA: Insufficient documentation

## 2015-05-17 DIAGNOSIS — M79604 Pain in right leg: Secondary | ICD-10-CM | POA: Diagnosis not present

## 2015-05-17 DIAGNOSIS — Y998 Other external cause status: Secondary | ICD-10-CM | POA: Insufficient documentation

## 2015-05-17 DIAGNOSIS — Y9289 Other specified places as the place of occurrence of the external cause: Secondary | ICD-10-CM | POA: Diagnosis not present

## 2015-05-17 LAB — BASIC METABOLIC PANEL
Anion gap: 5 (ref 5–15)
BUN: 18 mg/dL (ref 6–20)
CO2: 24 mmol/L (ref 22–32)
Calcium: 8.7 mg/dL — ABNORMAL LOW (ref 8.9–10.3)
Chloride: 109 mmol/L (ref 101–111)
Creatinine, Ser: 0.89 mg/dL (ref 0.61–1.24)
GFR calc Af Amer: >60 mL/min (ref >60)
GLUCOSE: 100 mg/dL — AB (ref 65–99)
POTASSIUM: 4.1 mmol/L (ref 3.5–5.1)
Sodium: 138 mmol/L (ref 135–145)

## 2015-05-17 LAB — CBC
HCT: 43.9 % (ref 40.0–52.0)
Hemoglobin: 15.2 g/dL (ref 13.0–18.0)
MCH: 32.7 pg (ref 26.0–34.0)
MCHC: 34.6 g/dL (ref 32.0–36.0)
MCV: 94.4 fL (ref 80.0–100.0)
PLATELETS: 220 10*3/uL (ref 150–440)
RBC: 4.65 MIL/uL (ref 4.40–5.90)
RDW: 12.7 % (ref 11.5–14.5)
WBC: 9.1 10*3/uL (ref 3.8–10.6)

## 2015-05-17 MED ORDER — TRAMADOL HCL 50 MG PO TABS
50.0000 mg | ORAL_TABLET | Freq: Once | ORAL | Status: AC
  Administered 2015-05-17: 50 mg via ORAL
  Filled 2015-05-17: qty 1

## 2015-05-17 NOTE — ED Provider Notes (Signed)
[facility] Emergency Department Provider Note  ____________________________________________  Time seen: Approximately 11:18 PM  I have reviewed the triage vital signs and the nursing notes.   HISTORY  Chief Complaint Fall and Leg Pain    HPI Seth LYDAY Sr. is a 75 y.o. male who comes to the hospital today with right-sided leg pain. The patient reports he fell the other day and he bruised his right leg. He reports though that the pain is been getting worse and worse. He reports that last Thursday he was getting up to go to the bathroom when he fell over a chair that he recently bought. He reports that he did not hit his head but he did hit the right lateral leg pretty significantly. The patient reports that when he is laying down he does not have much pain but that when he standing on it his pain as a 6-7 out of 10 in intensity. The patient has not been putting anything on the area for pain and he has not used any ice. He reports that it seemed to get worse today and he does have a history of phlebitis so he decided to come in and get checked out.   Past Medical History  Diagnosis Date  . Coronary artery disease   . Hypertension   . Arthritis     rheumatoid   . GERD (gastroesophageal reflux disease)   . Urgency of urination   . Diabetes mellitus without complication ([place])   . Sleeping difficulty     takes med nightly  . Depression   . Localized osteoarthritis of right knee 05/21/2012  . Reflux     Patient Active Problem List   Diagnosis Date Noted  . Erectile dysfunction 05/13/2015  . Coronary artery disease 02/12/2015  . Anxiety disorder 11/11/2014  . Diabetes mellitus type 2, controlled ([place]) 11/11/2014  . Hyperlipidemia 11/11/2014  . Reflux 11/11/2014  . GERD (gastroesophageal reflux disease) 11/11/2014  . Chronic insomnia 11/11/2014  . Urinary urgency 11/11/2014  . Arteriosclerosis of coronary artery 10/28/2014  . Benign essential HTN  08/12/2014  . Combined fat and carbohydrate induced hyperlipemia 08/12/2014  . Awareness of heartbeats 02/03/2014  . Localized osteoarthritis of right knee 05/21/2012    Past Surgical History  Procedure Laterality Date  . Foot surgery      x2  . Back surgery    . Coronary artery bypass graft  2004    x4 vessels  . Foot surgery      x2  . Transurethral resection of prostate    . 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 Ascorbic Acid (VITAMIN C) 1000 MG tablet   Oral   Take 2,000 mg by mouth daily.         Marland Kitchen aspirin EC 81 MG tablet   Oral   Take 81 mg by mouth daily.         . benzonatate (TESSALON) 200 MG capsule      Reported on 05/14/2015      0   . buPROPion (WELLBUTRIN XL) 300 MG 24 hr tablet      TAKE 1 TABLET DAILY   90 tablet   3   . busPIRone (BUSPAR) 15 MG tablet      TAKE 1  TABLET TWICE A DAY   180 tablet   3   . Cholecalciferol (VITAMIN D) 2000 UNITS tablet   Oral   Take 2,000 Units by mouth daily.         . clonazePAM (KLONOPIN) 0.5 MG tablet   Oral   Take 1 tablet (0.5 mg total) by mouth 2 (two) times daily as needed. anxiety   180 tablet   0   . Coenzyme Q10 (CO Q 10) 100 MG CAPS   Oral   Take 1 capsule by mouth daily.         . fish oil-omega-3 fatty acids 1000 MG capsule   Oral   Take 2 g by mouth daily.         . fluticasone (FLONASE) 50 MCG/ACT nasal spray   Nasal   Place 2 sprays into the nose daily.         . irbesartan (AVAPRO) 150 MG tablet   Oral   Take 1 tablet (150 mg total) by mouth daily.   90 tablet   0   . metFORMIN (GLUCOPHAGE) 1000 MG tablet      TAKE 1 TABLET TWICE A DAY WITH MEALS   180 tablet   3   . Multiple Vitamin (MULTIVITAMIN WITH MINERALS) TABS   Oral   Take 1 tablet by mouth daily.         .  Multiple Vitamins-Calcium (CALCI-MAX PO)   Oral   Take 1 tablet by mouth daily. Reported on 05/14/2015         . pantoprazole (PROTONIX) 40 MG tablet   Oral   Take 1 tablet (40 mg total) by mouth daily.   90 tablet   0   . sildenafil (VIAGRA) 50 MG tablet   Oral   Take 1 tablet (50 mg total) by mouth daily as needed for erectile dysfunction. 1 tablet po 30 mins prior to sexual activity   10 tablet   0   . solifenacin (VESICARE) 10 MG tablet   Oral   Take 1 tablet (10 mg total) by mouth daily.   90 tablet   3     Please email Lyndee Hensen at [email or web address]   . Suvorexant (BELSOMRA) 10 MG TABS   Oral   Take 1 tablet by mouth at bedtime as needed.   30 tablet   2   . VYTORIN 10-20 MG tablet      TAKE 1 TABLET DAILY AT 6 P.M   90 tablet   3     Allergies Review of patient's allergies indicates no known allergies.  Family History  Problem Relation Age of Onset  . Kidney disease Neg Hx   . Bladder Cancer Brother   . Prostate cancer Neg Hx     Social History Social History  Substance Use Topics  . Smoking status: Former Smoker    Quit date: 05/14/1992  . Smokeless tobacco: None  . Alcohol Use: 0.0 oz/week    0 Standard drinks or equivalent per week     Comment: moderatly    Review of Systems Constitutional: No fever/chills Eyes: No visual changes. ENT: No sore throat. Cardiovascular: Denies chest pain. Respiratory: Denies shortness of breath. Gastrointestinal: No abdominal pain.  No nausea, no vomiting.  No diarrhea.  No constipation. Genitourinary: Negative for dysuria. Musculoskeletal: Right leg pain Skin: Negative for rash. Neurological: Negative for headaches, focal weakness or numbness.  10-point ROS otherwise negative. ____________________________________________   PHYSICAL EXAM:  VITAL SIGNS: ED Triage  Vitals  Enc Vitals Group     BP 05/17/15 2158 158/87 mmHg     Pulse Rate 05/17/15 2158 84     Resp 05/17/15 2158 20      Temp 05/17/15 2158 97.6 F (36.4 C)     Temp Source 05/17/15 2158 Oral     SpO2 05/17/15 2158 98 %     Weight 05/17/15 2158 270 lb (122.471 kg)     Height 05/17/15 2158 6\' 2"  (1.88 m)     Head Cir --      Peak Flow --      Pain Score 05/17/15 2201 8     Pain Loc --      Pain Edu? --      Excl. in [place]? --     Constitutional: Alert and oriented. Well appearing and in mild distress. Eyes: Conjunctivae are normal. PERRL. EOMI. Head: Atraumatic. Nose: No congestion/rhinnorhea. Mouth/Throat: Mucous membranes are moist.  Oropharynx non-erythematous. Neck: No cervical spine tenderness to palpation Cardiovascular: Normal rate, regular rhythm. Grossly normal heart sounds.  Good peripheral circulation. Respiratory: Normal respiratory effort.  No retractions. Lungs CTAB. Gastrointestinal: Soft and nontender. No distention. Positive bowel sounds Musculoskeletal: bruise to right lateral leg with some tenderness over the area Neurologic:  Normal speech and language.  Skin:  Skin is warm, dry and intact.  Psychiatric: Mood and affect are normal.   ____________________________________________   LABS (all labs ordered are listed, but only abnormal results are displayed)  Labs Reviewed  BASIC METABOLIC PANEL - Abnormal; Notable for the following:    Glucose, Bld 100 (*)    Calcium 8.7 (*)    All other components within normal limits  CBC   ____________________________________________  EKG  none ____________________________________________  RADIOLOGY  X-rays tibia and fibula: No evidence of fracture or dislocation, total knee arthroplasty is unremarkable in appearance though not fully imaged on the study  Right lower quadrant ultrasound: No evidence of DVT ____________________________________________   PROCEDURES  Procedure(s) performed: None  Critical Care performed: No  ____________________________________________   INITIAL IMPRESSION / ASSESSMENT AND PLAN / ED  COURSE  Pertinent labs & imaging results that were available during my care of the patient were reviewed by me and considered in my medical decision making (see chart for details).  This is a 75 year old male who comes in to the hospital today after a fall 5 days ago. The patient does have a bruise to his right lateral leg. I'll perform an x-ray as well as check a CBC to evaluate for possible anemia due to the patient's right lower leg hematoma. I will give the patient some pain medicine as well and placed ice over the area.  The patient did receive some tramadol and his pain is slightly improved. He'll be discharged home as he does have a contusion and he should follow up with his primary care physician. The patient's blood work is also unremarkable. ____________________________________________   FINAL CLINICAL IMPRESSION(S) / ED DIAGNOSES  Final diagnoses:  Contusion of leg, right, initial encounter      Loney Hering, MD 05/18/15 0840

## 2015-05-17 NOTE — ED Notes (Addendum)
Pt fell 2 days ago and has pain in right lower leg with swelling and bruising.  Denies other injury   Pt states he tripped over a chair and fell onto the floor.

## 2015-05-18 DIAGNOSIS — N138 Other obstructive and reflux uropathy: Secondary | ICD-10-CM | POA: Insufficient documentation

## 2015-05-18 DIAGNOSIS — N529 Male erectile dysfunction, unspecified: Secondary | ICD-10-CM | POA: Insufficient documentation

## 2015-05-18 DIAGNOSIS — N401 Enlarged prostate with lower urinary tract symptoms: Principal | ICD-10-CM

## 2015-05-18 NOTE — Discharge Instructions (Signed)
Contusion A contusion is a deep bruise. Contusions are the result of a blunt injury to tissues and muscle fibers under the skin. The injury causes bleeding under the skin. The skin overlying the contusion may turn blue, purple, or yellow. Minor injuries will give you a painless contusion, but more severe contusions may stay painful and swollen for a few weeks.  CAUSES  This condition is usually caused by a blow, trauma, or direct force to an area of the body. SYMPTOMS  Symptoms of this condition include:  Swelling of the injured area.  Pain and tenderness in the injured area.  Discoloration. The area may have redness and then turn blue, purple, or yellow. DIAGNOSIS  This condition is diagnosed based on a physical exam and medical history. An X-ray, CT scan, or MRI may be needed to determine if there are any associated injuries, such as broken bones (fractures). TREATMENT  Specific treatment for this condition depends on what area of the body was injured. In general, the best treatment for a contusion is resting, icing, applying pressure to (compression), and elevating the injured area. This is often called the RICE strategy. Over-the-counter anti-inflammatory medicines may also be recommended for pain control.  HOME CARE INSTRUCTIONS   Rest the injured area.  If directed, apply ice to the injured area:  Put ice in a plastic bag.  Place a towel between your skin and the bag.  Leave the ice on for 20 minutes, 2-3 times per day.  If directed, apply light compression to the injured area using an elastic bandage. Make sure the bandage is not wrapped too tightly. Remove and reapply the bandage as directed by your health care provider.  If possible, raise (elevate) the injured area above the level of your heart while you are sitting or lying down.  Take over-the-counter and prescription medicines only as told by your health care provider. SEEK MEDICAL CARE IF:  Your symptoms do not  improve after several days of treatment.  Your symptoms get worse.  You have difficulty moving the injured area. SEEK IMMEDIATE MEDICAL CARE IF:   You have severe pain.  You have numbness in a hand or foot.  Your hand or foot turns pale or cold.   This information is not intended to replace advice given to you by your health care provider. Make sure you discuss any questions you have with your health care provider.   Document Released: 01/18/2005 Document Revised: 12/30/2014 Document Reviewed: 08/26/2014 Elsevier Interactive Patient Education 2016 Elsevier Inc[employer]  Musculoskeletal Pain Musculoskeletal pain is muscle and boney aches and pains. These pains can occur in any part of the body. Your caregiver may treat you without knowing the cause of the pain. They may treat you if blood or urine tests, X-rays, and other tests were normal.  CAUSES There is often not a definite cause or reason for these pains. These pains may be caused by a type of germ (virus). The discomfort may also come from overuse. Overuse includes working out too hard when your body is not fit. Boney aches also come from weather changes. Bone is sensitive to atmospheric pressure changes. HOME CARE INSTRUCTIONS   Ask when your test results will be ready. Make sure you get your test results.  Only take over-the-counter or prescription medicines for pain, discomfort, or fever as directed by your caregiver. If you were given medications for your condition, do not drive, operate machinery or power tools, or sign legal documents for 24 hours. Do  not drink alcohol. Do not take sleeping pills or other medications that may interfere with treatment.  Continue all activities unless the activities cause more pain. When the pain lessens, slowly resume normal activities. Gradually increase the intensity and duration of the activities or exercise.  During periods of severe pain, bed rest may be helpful. Lay or sit in any position that  is comfortable.  Putting ice on the injured area.  Put ice in a bag.  Place a towel between your skin and the bag.  Leave the ice on for 15 to 20 minutes, 3 to 4 times a day.  Follow up with your caregiver for continued problems and no reason can be found for the pain. If the pain becomes worse or does not go away, it may be necessary to repeat tests or do additional testing. Your caregiver may need to look further for a possible cause. SEEK IMMEDIATE MEDICAL CARE IF:  You have pain that is getting worse and is not relieved by medications.  You develop chest pain that is associated with shortness or breath, sweating, feeling sick to your stomach (nauseous), or throw up (vomit).  Your pain becomes localized to the abdomen.  You develop any new symptoms that seem different or that concern you. MAKE SURE YOU:   Understand these instructions.  Will watch your condition.  Will get help right away if you are not doing well or get worse.   This information is not intended to replace advice given to you by your health care provider. Make sure you discuss any questions you have with your health care provider.   Document Released: 04/10/2005 Document Revised: 07/03/2011 Document Reviewed: 12/13/2012 Elsevier Interactive Patient Education [employer].

## 2015-06-02 ENCOUNTER — Ambulatory Visit (INDEPENDENT_AMBULATORY_CARE_PROVIDER_SITE_OTHER): Payer: Medicare Other | Admitting: Urology

## 2015-06-02 ENCOUNTER — Encounter: Payer: Self-pay | Admitting: Urology

## 2015-06-02 VITALS — BP 122/79 | HR 65 | Ht 75.0 in | Wt 273.7 lb

## 2015-06-02 DIAGNOSIS — R3915 Urgency of urination: Secondary | ICD-10-CM | POA: Diagnosis not present

## 2015-06-02 LAB — PTNS-PERCUTANEOUS TIBIAL NERVE STIMULATION: Scan Result: 10

## 2015-06-02 MED ORDER — SOLIFENACIN SUCCINATE 10 MG PO TABS: 10.0000 mg | ORAL_TABLET | Freq: Every day | ORAL | Status: DC

## 2015-06-02 NOTE — Progress Notes (Signed)
Chief Complaint:  Chief Complaint  Patient presents with  . Urinary Urgency    PTNS     HPI: Patient is a 75 year old white male who presents today for PTNS treatment for urinary urgency. This is a monthly maintenance treatment  Background history Mr. Trautner is a 75 year old white male with a long-standing history of urinary frequency, nocturia and urge incontinence. He does not recall specific event that led to the urinary symptoms.  His amount of leakage is variable. He wears depends when he goes on long car trips. He will have an occasional leakage when he is not driving, but it is a scant amount of urine.  He does not lose urine if he coughs, sneezes or laughs. He also does not lose urine if he changing position or bending or lifting.        After his last treatment, he states he is having 12 (unchanged) trips to the bathroom during the day to void. He is getting up 3 (stable) times a night to void as well. He is not experiencing any pain or burning with urination. He has a strong urinary urgency. He is experiencing 1 (unchanged) incontinent episodes daily.     He is consuming 2 caffeinated beverages daily, occasional alcohol consumption and does not drink copious amount of water during the day.      Previous Therapy: Patient had been evaluated and treated through an urologist in [place]. He has tried Dispensing optician, Chartered certified accountant, Advertising copywriter and Vesicare in the past. All with mixed results. With our office he had a trial of Myrbetriq, which he failed.  He was given a trial of Vesicare 10 mg daily. He found a great relief with this medication, but his insurance would not cover the medication.  Blood pressure 122/79, pulse 65, height 6\' 3"  (1.905 m), weight 273 lb 11.2 oz (124.15 kg).  He does not have any contraindications present for PTNS, such as: Pacemaker, Implantable defibrillator, History of abnormal bleeding or History of neuropathies or nerve damage.  Discussed with patient possible  complications of procedure, such as discomfort, bleeding at insertion/stimulation site, procedure consent signed  Patient goals: His goals are to reduce urinary urgency and frequency.  PTNS treatment: The needle electrode was inserted into the lower, inner aspect of the patient's right leg. The surface electrode was placed on the inside arch of the foot on the treatment leg. The lead set was connected to the stimulator, and the needle electrode clip was connected to the needle electrode. The stimulator that produces an adjustable electrical pulse that travels to the sacral nerve plexus via the tibial nerve was increased to 10 and  the patient received a sensory response.   Treatment Plan:  Patient tolerated the PTNS treatment well for 30 minutes. The electrode was removed with out difficulty. He will return in 1 month for his next maintenance  PTNS treatment.     Patient will need an office visit for his annual visit.  PSA is drawn today.  He will return next week for IPSS score, PVR and exam.

## 2015-06-14 ENCOUNTER — Ambulatory Visit (INDEPENDENT_AMBULATORY_CARE_PROVIDER_SITE_OTHER): Payer: Medicare Other | Admitting: Family Medicine

## 2015-06-14 ENCOUNTER — Encounter: Payer: Self-pay | Admitting: Family Medicine

## 2015-06-14 VITALS — BP 123/69 | HR 75 | Temp 97.9°F | Resp 18 | Ht 75.0 in | Wt 274.6 lb

## 2015-06-14 DIAGNOSIS — E785 Hyperlipidemia, unspecified: Secondary | ICD-10-CM

## 2015-06-14 DIAGNOSIS — E119 Type 2 diabetes mellitus without complications: Secondary | ICD-10-CM

## 2015-06-14 DIAGNOSIS — F411 Generalized anxiety disorder: Secondary | ICD-10-CM | POA: Diagnosis not present

## 2015-06-14 LAB — POCT GLYCOSYLATED HEMOGLOBIN (HGB A1C): HEMOGLOBIN A1C: 6.7

## 2015-06-14 MED ORDER — CLONAZEPAM 0.5 MG PO TABS: 0.5000 mg | ORAL_TABLET | Freq: Two times a day (BID) | ORAL | Status: DC | PRN

## 2015-06-14 NOTE — Progress Notes (Signed)
Name: Seth BARES Sr.   MRN: [id number]    DOB: 11/05/1940   Date:06/14/2015       Progress Note  Subjective  Chief Complaint  Chief Complaint  Patient presents with  . Follow-up    1 mo  . Diabetes  . Hyperlipidemia  . Anxiety    Diabetes He has type 2 diabetes mellitus. His disease course has been stable. Hypoglycemia symptoms include nervousness/anxiousness. Pertinent negatives for diabetes include no fatigue, no polydipsia and no polyuria. Current diabetic treatment includes oral agent (monotherapy).  Hyperlipidemia This is a chronic problem. The problem is controlled. Recent lipid tests were reviewed and are normal. Pertinent negatives include no leg pain or myalgias. Current antihyperlipidemic treatment includes statins.  Anxiety Presents for follow-up visit. The problem has been unchanged. Symptoms include excessive worry, insomnia, irritability and nervous/anxious behavior.   His past medical history is significant for anxiety/panic attacks.    Past Medical History  Diagnosis Date  . Coronary artery disease   . Hypertension   . Arthritis     rheumatoid   . GERD (gastroesophageal reflux disease)   . Urgency of urination   . Diabetes mellitus without complication ([place])   . Sleeping difficulty     takes med nightly  . Depression   . Localized osteoarthritis of right knee 05/21/2012  . Reflux     Past Surgical History  Procedure Laterality Date  . Foot surgery      x2  . Back surgery    . Coronary artery bypass graft  2004    x4 vessels  . Foot surgery      x2  . Transurethral resection of prostate    . 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 Kidney disease Neg Hx   . Bladder Cancer Brother   . Prostate cancer Neg Hx     Social History   Social History  . Marital Status:  Widowed    Spouse Name: N/A  . Number of Children: N/A  . Years of Education: N/A   Occupational History  . Not on file.   Social History Main Topics  . Smoking status: Former Smoker    Quit date: 05/14/1992  . Smokeless tobacco: Not on file  . Alcohol Use: 0.0 oz/week    0 Standard drinks or equivalent per week     Comment: moderatly  . Drug Use: No  . Sexual Activity: Not on file   Other Topics Concern  . Not on file   Social History Narrative     Current outpatient prescriptions:  .  Ascorbic Acid (VITAMIN C) 1000 MG tablet, Take 2,000 mg by mouth daily., Disp: , Rfl:  .  aspirin EC 81 MG tablet, Take 81 mg by mouth daily., Disp: , Rfl:  .  buPROPion (WELLBUTRIN XL) 300 MG 24 hr tablet, TAKE 1 TABLET DAILY, Disp: 90 tablet, Rfl: 3 .  busPIRone (BUSPAR) 15 MG tablet, TAKE 1 TABLET TWICE A DAY, Disp: 180 tablet, Rfl: 3 .  Cholecalciferol (VITAMIN D) 2000 UNITS tablet, Take 2,000 Units by mouth daily., Disp: , Rfl:  .  clonazePAM (KLONOPIN) 0.5 MG tablet, Take 1 tablet (0.5 mg total) by mouth 2 (two) times daily as needed. anxiety, Disp: 180 tablet, Rfl: 0 .  Coenzyme Q10 (CO Q 10) 100  MG CAPS, Take 1 capsule by mouth daily., Disp: , Rfl:  .  fish oil-omega-3 fatty acids 1000 MG capsule, Take 2 g by mouth daily., Disp: , Rfl:  .  fluticasone (FLONASE) 50 MCG/ACT nasal spray, Place 2 sprays into the nose daily., Disp: , Rfl:  .  irbesartan (AVAPRO) 150 MG tablet, Take 1 tablet (150 mg total) by mouth daily., Disp: 90 tablet, Rfl: 0 .  metFORMIN (GLUCOPHAGE) 1000 MG tablet, TAKE 1 TABLET TWICE A DAY WITH MEALS, Disp: 180 tablet, Rfl: 3 .  Multiple Vitamin (MULTIVITAMIN WITH MINERALS) TABS, Take 1 tablet by mouth daily., Disp: , Rfl:  .  Multiple Vitamins-Calcium (CALCI-MAX PO), Take 1 tablet by mouth daily. Reported on 06/02/2015, Disp: , Rfl:  .  pantoprazole (PROTONIX) 40 MG tablet, Take 1 tablet (40 mg total) by mouth daily., Disp: 90 tablet, Rfl: 0 .  sildenafil (VIAGRA) 50 MG  tablet, Take 1 tablet (50 mg total) by mouth daily as needed for erectile dysfunction. 1 tablet po 30 mins prior to sexual activity, Disp: 10 tablet, Rfl: 0 .  solifenacin (VESICARE) 10 MG tablet, Take 1 tablet (10 mg total) by mouth daily., Disp: 90 tablet, Rfl: 3 .  solifenacin (VESICARE) 10 MG tablet, Take 1 tablet (10 mg total) by mouth daily., Disp: 90 tablet, Rfl: 3 .  Suvorexant (BELSOMRA) 10 MG TABS, Take 1 tablet by mouth at bedtime as needed., Disp: 30 tablet, Rfl: 2 .  VYTORIN 10-20 MG tablet, TAKE 1 TABLET DAILY AT 6 P.M, Disp: 90 tablet, Rfl: 3 .  benzonatate (TESSALON) 200 MG capsule, Reported on 06/14/2015, Disp: , Rfl: 0  No Known Allergies   Review of Systems  Constitutional: Positive for irritability. Negative for fatigue.  Musculoskeletal: Negative for myalgias.  Endo/Heme/Allergies: Negative for polydipsia.  Psychiatric/Behavioral: The patient is nervous/anxious and has insomnia.       Objective  Filed Vitals:   06/14/15 0844  BP: 123/69  Pulse: 75  Temp: 97.9 F (36.6 C)  TempSrc: Oral  Resp: 18  Height: 6\' 3"  (1.905 m)  Weight: 274 lb 9.6 oz (124.558 kg)  SpO2: 97%    Physical Exam  Constitutional: He is oriented to person, place, and time and well-developed, well-nourished, and in no distress.  HENT:  Head: Normocephalic and atraumatic.  Cardiovascular: Normal rate and regular rhythm.   Pulmonary/Chest: Effort normal and breath sounds normal.  Abdominal: Soft. Bowel sounds are normal.  Musculoskeletal: Normal range of motion.  Neurological: He is alert and oriented to person, place, and time.  Skin: Skin is warm and dry.  Nursing note and vitals reviewed.     Assessment & Plan  1. Controlled type 2 diabetes mellitus without complication, without long-term current use of insulin (HCC)  A1c at goal , continue present therapy - POCT HgB A1C  2. Generalized anxiety disorder  recently somewhat worse 2/2 illness of his granddaughter. Klonopin  helping with relief. - clonazePAM (KLONOPIN) 0.5 MG tablet; Take 1 tablet (0.5 mg total) by mouth 2 (two) times daily as needed. anxiety  Dispense: 180 tablet; Refill: 0  3. Hyperlipidemia  - Lipid Profile   [name] [facility] 06/14/2015 8:59 AM

## 2015-06-15 LAB — LIPID PANEL
CHOLESTEROL TOTAL: 124 mg/dL (ref 100–199)
Chol/HDL Ratio: 2 ratio units (ref 0.0–5.0)
HDL: 63 mg/dL (ref >39)
LDL CALC: 50 mg/dL (ref 0–99)
Triglycerides: 55 mg/dL (ref 0–149)
VLDL Cholesterol Cal: 11 mg/dL (ref 5–40)

## 2015-06-18 ENCOUNTER — Telehealth: Payer: Self-pay | Admitting: *Deleted

## 2015-06-18 NOTE — Telephone Encounter (Signed)
Spoke with patient and let him know that we received approval through his insurance for his Vesicare. Patient states Great. Let him know its available at his pharmacy. Patient understands.

## 2015-07-07 ENCOUNTER — Encounter: Payer: Self-pay | Admitting: Urology

## 2015-07-07 ENCOUNTER — Ambulatory Visit (INDEPENDENT_AMBULATORY_CARE_PROVIDER_SITE_OTHER): Payer: Medicare Other | Admitting: Urology

## 2015-07-07 VITALS — BP 160/85 | HR 70 | Ht 74.0 in | Wt 278.3 lb

## 2015-07-07 DIAGNOSIS — R350 Frequency of micturition: Secondary | ICD-10-CM | POA: Diagnosis not present

## 2015-07-07 LAB — URINALYSIS, COMPLETE
Bilirubin, UA: NEGATIVE
Glucose, UA: NEGATIVE
KETONES UA: NEGATIVE
LEUKOCYTES UA: NEGATIVE
Nitrite, UA: NEGATIVE
PH UA: 5.5 (ref 5.0–7.5)
PROTEIN UA: NEGATIVE
RBC, UA: NEGATIVE
Specific Gravity, UA: 1.015 (ref 1.005–1.030)
Urobilinogen, Ur: 0.2 mg/dL (ref 0.2–1.0)

## 2015-07-07 LAB — MICROSCOPIC EXAMINATION
Bacteria, UA: NONE SEEN
Epithelial Cells (non renal): NONE SEEN /hpf (ref 0–10)

## 2015-07-07 LAB — BLADDER SCAN AMB NON-IMAGING: Scan Result: 204

## 2015-07-07 NOTE — Progress Notes (Addendum)
Chief Complaint:  Chief Complaint  Patient presents with  . PTNS     HPI: Patient is a 75 year old white male who presents today for PTNS treatment for urinary urgency. This is a monthly maintenance treatment  Background history Seth Mcintosh is a 75 year old white male with a long-standing history of urinary frequency, nocturia and urge incontinence. He does not recall specific event that led to the urinary symptoms.  His amount of leakage is variable. He wears depends when he goes on long car trips. He will have an occasional leakage when he is not driving, but it is a scant amount of urine.  He does not lose urine if he coughs, sneezes or laughs. He also does not lose urine if he changing position or bending or lifting.        After his last treatment, he has noticed an increase of urinary frequency.  He had the flu and the frequency started after the flu abated.  He has not had any gross hematuria, dysuria or suprapubic pain.  His PVR is 204 mL.  His UA is unremarkable.  He states he is having 12 (unchanged) trips to the bathroom during the day to void. He is getting up 5 (worse) times a night to void as well. He is not experiencing any pain or burning with urination. He has a strong urinary urgency. He is experiencing 1 (unchanged) incontinent episodes daily.     He is consuming 2 caffeinated beverages daily, occasional alcohol consumption and does not drink copious amount of water during the day.      Previous Therapy: Patient had been evaluated and treated through an urologist in [place]. He has tried Dispensing optician, Chartered certified accountant, Advertising copywriter and Vesicare in the past. All with mixed results. With our office he had a trial of Myrbetriq, which he failed.  He was given a trial of Vesicare 10 mg daily. He found a great relief with this medication, but his insurance would not cover the medication.  Blood pressure 160/85, pulse 70, height 6\' 2"  (1.88 m), weight 278 lb 4.8 oz (126.236 kg).  He does not have any  contraindications present for PTNS, such as: Pacemaker, Implantable defibrillator, History of abnormal bleeding or History of neuropathies or nerve damage.  Discussed with patient possible complications of procedure, such as discomfort, bleeding at insertion/stimulation site, procedure consent signed  Patient goals: His goals are to reduce urinary urgency and frequency.  PTNS treatment: The needle electrode was inserted into the lower, inner aspect of the patient's left leg. The surface electrode was placed on the inside arch of the foot on the treatment leg. The lead set was connected to the stimulator, and the needle electrode clip was connected to the needle electrode. The stimulator that produces an adjustable electrical pulse that travels to the sacral nerve plexus via the tibial nerve was increased to 9 and the patient received a sensory response.   Treatment Plan:  Patient tolerated the PTNS treatment well for 30 minutes. The electrode was removed with out difficulty. He will return in 1 month for his next maintenance  PTNS treatment.     Patient has noted an increase in his urinary frequency since his last visit.  He did admit to increasing his fluid intake while he had the flu.  He did not have diarrhea or vomiting with his flu.  His UA was unremarkable.  His PVR was 204 mL.  He will continue the Veiscare at this time.  I have sent the  urine for culture and we will contact him with those results.

## 2015-07-07 NOTE — Progress Notes (Signed)
PTNS  Session # maintenance 4   Health & Social Factors: more frequenct Caffeine: 1 Alcohol: 1 Daytime voids #per day: 12 Night-time voids #per night: 5 Urgency: strong Incontinence Episodes #per day: 1 Ankle used: left Treatment Setting: 9 Feeling/ Response: senory Comments: taking vesicare  Preformed By: Zara Council, PA  Assistant: Toniann Fail, LPN  Follow Up: 1 month

## 2015-07-07 NOTE — Addendum Note (Signed)
Addended by: Lestine Box on: 07/07/2015 03:56 PM   Modules accepted: Orders

## 2015-07-08 NOTE — Addendum Note (Signed)
Addended by: Lestine Box on: 07/08/2015 08:49 AM   Modules accepted: Orders

## 2015-07-11 ENCOUNTER — Other Ambulatory Visit: Payer: Self-pay | Admitting: Family Medicine

## 2015-07-13 ENCOUNTER — Telehealth: Payer: Self-pay

## 2015-07-13 LAB — CULTURE, URINE COMPREHENSIVE

## 2015-07-13 NOTE — Telephone Encounter (Signed)
Spoke with pt in reference to -ucx. Pt voiced understanding.  

## 2015-07-13 NOTE — Telephone Encounter (Signed)
-----   Message from Nori Riis, PA-C sent at 07/13/2015 12:01 PM EDT ----- This is a skin contaminate.  No infection.

## 2015-08-02 DIAGNOSIS — J329 Chronic sinusitis, unspecified: Secondary | ICD-10-CM | POA: Diagnosis not present

## 2015-08-02 DIAGNOSIS — Z87891 Personal history of nicotine dependence: Secondary | ICD-10-CM | POA: Diagnosis not present

## 2015-08-02 DIAGNOSIS — J301 Allergic rhinitis due to pollen: Secondary | ICD-10-CM | POA: Diagnosis not present

## 2015-08-11 ENCOUNTER — Encounter: Payer: Self-pay | Admitting: Emergency Medicine

## 2015-08-11 ENCOUNTER — Emergency Department
Admission: EM | Admit: 2015-08-11 | Discharge: 2015-08-11 | Disposition: A | Discharge status: Home / Self Care | Payer: Medicare Other | Attending: Emergency Medicine | Admitting: Emergency Medicine

## 2015-08-11 DIAGNOSIS — IMO0002 Reserved for concepts with insufficient information to code with codable children: Secondary | ICD-10-CM

## 2015-08-11 DIAGNOSIS — T148 Other injury of unspecified body region: Secondary | ICD-10-CM | POA: Diagnosis not present

## 2015-08-11 DIAGNOSIS — W2203XA Walked into furniture, initial encounter: Secondary | ICD-10-CM | POA: Insufficient documentation

## 2015-08-11 DIAGNOSIS — F329 Major depressive disorder, single episode, unspecified: Secondary | ICD-10-CM | POA: Insufficient documentation

## 2015-08-11 DIAGNOSIS — Z87891 Personal history of nicotine dependence: Secondary | ICD-10-CM | POA: Insufficient documentation

## 2015-08-11 DIAGNOSIS — I10 Essential (primary) hypertension: Secondary | ICD-10-CM | POA: Diagnosis not present

## 2015-08-11 DIAGNOSIS — I2510 Atherosclerotic heart disease of native coronary artery without angina pectoris: Secondary | ICD-10-CM | POA: Insufficient documentation

## 2015-08-11 DIAGNOSIS — R42 Dizziness and giddiness: Secondary | ICD-10-CM | POA: Insufficient documentation

## 2015-08-11 DIAGNOSIS — Y929 Unspecified place or not applicable: Secondary | ICD-10-CM | POA: Insufficient documentation

## 2015-08-11 DIAGNOSIS — S81811A Laceration without foreign body, right lower leg, initial encounter: Secondary | ICD-10-CM | POA: Insufficient documentation

## 2015-08-11 DIAGNOSIS — Z79899 Other long term (current) drug therapy: Secondary | ICD-10-CM | POA: Insufficient documentation

## 2015-08-11 DIAGNOSIS — Z7984 Long term (current) use of oral hypoglycemic drugs: Secondary | ICD-10-CM | POA: Insufficient documentation

## 2015-08-11 DIAGNOSIS — Y999 Unspecified external cause status: Secondary | ICD-10-CM | POA: Insufficient documentation

## 2015-08-11 DIAGNOSIS — Y939 Activity, unspecified: Secondary | ICD-10-CM | POA: Diagnosis not present

## 2015-08-11 DIAGNOSIS — E119 Type 2 diabetes mellitus without complications: Secondary | ICD-10-CM | POA: Insufficient documentation

## 2015-08-11 DIAGNOSIS — Z794 Long term (current) use of insulin: Secondary | ICD-10-CM | POA: Diagnosis not present

## 2015-08-11 DIAGNOSIS — T148XXA Other injury of unspecified body region, initial encounter: Secondary | ICD-10-CM

## 2015-08-11 LAB — COMPREHENSIVE METABOLIC PANEL
ALBUMIN: 4.1 g/dL (ref 3.5–5.0)
ALK PHOS: 45 U/L (ref 38–126)
ALT: 27 U/L (ref 17–63)
AST: 20 U/L (ref 15–41)
Anion gap: 9 (ref 5–15)
BUN: 13 mg/dL (ref 6–20)
CALCIUM: 8.6 mg/dL — AB (ref 8.9–10.3)
CHLORIDE: 108 mmol/L (ref 101–111)
CO2: 21 mmol/L — AB (ref 22–32)
CREATININE: 0.95 mg/dL (ref 0.61–1.24)
GFR calc Af Amer: >60 mL/min (ref >60)
GFR calc non Af Amer: >60 mL/min (ref >60)
GLUCOSE: 156 mg/dL — AB (ref 65–99)
Potassium: 3.9 mmol/L (ref 3.5–5.1)
SODIUM: 138 mmol/L (ref 135–145)
Total Bilirubin: 0.5 mg/dL (ref 0.3–1.2)
Total Protein: 6.8 g/dL (ref 6.5–8.1)

## 2015-08-11 LAB — ETHANOL: Alcohol, Ethyl (B): 164 mg/dL — ABNORMAL HIGH (ref <5)

## 2015-08-11 LAB — CBC
HCT: 40.7 % (ref 40.0–52.0)
HEMOGLOBIN: 14.2 g/dL (ref 13.0–18.0)
MCH: 32.9 pg (ref 26.0–34.0)
MCHC: 34.9 g/dL (ref 32.0–36.0)
MCV: 94.2 fL (ref 80.0–100.0)
Platelets: 223 10*3/uL (ref 150–440)
RBC: 4.32 MIL/uL — AB (ref 4.40–5.90)
RDW: 12.9 % (ref 11.5–14.5)
WBC: 7.3 10*3/uL (ref 3.8–10.6)

## 2015-08-11 LAB — TROPONIN I: Troponin I: <0.03 ng/mL (ref <0.031)

## 2015-08-11 MED ORDER — LIDOCAINE HCL (PF) 1 % IJ SOLN
5.0000 mL | Freq: Once | INTRAMUSCULAR | Status: AC
  Administered 2015-08-11: 5 mL via INTRADERMAL

## 2015-08-11 MED ORDER — LIDOCAINE HCL (PF) 1 % IJ SOLN
INTRAMUSCULAR | Status: AC
  Administered 2015-08-11: 5 mL via INTRADERMAL
  Filled 2015-08-11: qty 5

## 2015-08-11 NOTE — ED Notes (Signed)
Pt arrived to the ED via EMS from his house after sustaining a laceration to the lateral aspect of the right leg. Pt reports that he was going to the bathroom when he hit his dresser. Pt denies any other accompanied symptoms and denies LOC. Pt admits to ETOH consumption prior to arrival. Pt is AOx4 in no apparent distress.

## 2015-08-11 NOTE — ED Provider Notes (Signed)
[facility] Emergency Department Provider Note  ____________________________________________  Time seen: Approximately 210 AM  I have reviewed the triage vital signs and the nursing notes.   HISTORY  Chief Complaint Extremity Laceration    HPI JAHMAD MERCEDES Sr. is a 75 y.o. male who comes into the hospital today with a laceration to his right leg. The patient reports that he has family visiting and his granddaughter was sleeping in the bed with him. He reports that she was sleeping on the opposite side to where he was. He got up and felt dizzy and reports that he stumbled into the edge of the dresser drawer and it hit his leg. The patient reports that he has had some occasional dizziness when he gets up at night but he feels he was more disoriented because he was sleeping on the wrong side of the bed. He denies any syncope and denies hitting his head. He did fall 2 months ago. He takes aspirin and has had clots in his legs in the past. Patient denies any chest pain and shortness of breath any vomiting or abdominal pain. He reports that he did walk today. The patient was also drinking tonight and he reports that he did drink a significant amount of alcohol.He is here to repair the laceration to his leg.   Past Medical History  Diagnosis Date  . Coronary artery disease   . Hypertension   . Arthritis     rheumatoid   . GERD (gastroesophageal reflux disease)   . Urgency of urination   . Diabetes mellitus without complication ([place])   . Sleeping difficulty     takes med nightly  . Depression   . Localized osteoarthritis of right knee 05/21/2012  . Reflux     Patient Active Problem List   Diagnosis Date Noted  . Urinary frequency 07/07/2015  . BPH with obstruction/lower urinary tract symptoms 05/18/2015  . Erectile dysfunction of organic origin 05/18/2015  . Erectile dysfunction 05/13/2015  . Coronary artery disease 02/12/2015  . Anxiety disorder 11/11/2014   . Diabetes mellitus type 2, controlled ([place]) 11/11/2014  . Hyperlipidemia 11/11/2014  . Reflux 11/11/2014  . GERD (gastroesophageal reflux disease) 11/11/2014  . Chronic insomnia 11/11/2014  . Urinary urgency 11/11/2014  . Arteriosclerosis of coronary artery 10/28/2014  . Benign essential HTN 08/12/2014  . Combined fat and carbohydrate induced hyperlipemia 08/12/2014  . Awareness of heartbeats 02/03/2014  . Localized osteoarthritis of right knee 05/21/2012    Past Surgical History  Procedure Laterality Date  . Foot surgery      x2  . Back surgery    . Coronary artery bypass graft  2004    x4 vessels  . Foot surgery      x2  . Transurethral resection of prostate    . 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 Ascorbic Acid (VITAMIN C) 1000 MG tablet   Oral   Take 2,000 mg by mouth daily.         Marland Kitchen aspirin EC 81 MG tablet   Oral   Take 81 mg by mouth daily.         . benzonatate (TESSALON) 200 MG capsule      Reported on 06/14/2015      0   .  buPROPion (WELLBUTRIN XL) 300 MG 24 hr tablet      TAKE 1 TABLET DAILY   90 tablet   3   . busPIRone (BUSPAR) 15 MG tablet      TAKE 1 TABLET TWICE A DAY   180 tablet   3   . Cholecalciferol (VITAMIN D) 2000 UNITS tablet   Oral   Take 2,000 Units by mouth daily.         . clonazePAM (KLONOPIN) 0.5 MG tablet   Oral   Take 1 tablet (0.5 mg total) by mouth 2 (two) times daily as needed. anxiety   180 tablet   0   . Coenzyme Q10 (CO Q 10) 100 MG CAPS   Oral   Take 1 capsule by mouth daily.         . fish oil-omega-3 fatty acids 1000 MG capsule   Oral   Take 2 g by mouth daily.         . fluticasone (FLONASE) 50 MCG/ACT nasal spray   Nasal   Place 2 sprays into the nose daily.         . irbesartan (AVAPRO) 150  MG tablet   Oral   Take 1 tablet (150 mg total) by mouth daily.   90 tablet   0   . metFORMIN (GLUCOPHAGE) 1000 MG tablet      TAKE 1 TABLET TWICE A DAY WITH MEALS   180 tablet   3   . Multiple Vitamin (MULTIVITAMIN WITH MINERALS) TABS   Oral   Take 1 tablet by mouth daily.         . Multiple Vitamins-Calcium (CALCI-MAX PO)   Oral   Take 1 tablet by mouth daily. Reported on 06/02/2015         . pantoprazole (PROTONIX) 40 MG tablet   Oral   Take 1 tablet (40 mg total) by mouth daily.   90 tablet   0   . sildenafil (VIAGRA) 50 MG tablet   Oral   Take 1 tablet (50 mg total) by mouth daily as needed for erectile dysfunction. 1 tablet po 30 mins prior to sexual activity   10 tablet   0   . solifenacin (VESICARE) 10 MG tablet   Oral   Take 1 tablet (10 mg total) by mouth daily.   90 tablet   3     Please email Lyndee Hensen at [email or web address]  [email or web address]   . solifenacin (VESICARE) 10 MG tablet   Oral   Take 1 tablet (10 mg total) by mouth daily.   90 tablet   3   . Suvorexant (BELSOMRA) 10 MG TABS   Oral   Take 1 tablet by mouth at bedtime as needed.   30 tablet   2   . VYTORIN 10-20 MG tablet      TAKE 1 TABLET DAILY AT 6 P.M   90 tablet   3     Allergies Review of patient's allergies indicates no known allergies.  Family History  Problem Relation Age of Onset  . Kidney disease Neg Hx   . Bladder Cancer Brother   . Prostate cancer Neg Hx     Social History Social History  Substance Use Topics  . Smoking status: Former Smoker    Quit date: 05/14/1992  . Smokeless tobacco: None  . Alcohol Use: 0.0 oz/week    0 Standard drinks or equivalent per week     Comment: moderatly    Review of Systems Constitutional: No  fever/chills Eyes: No visual changes. ENT: No sore throat. Cardiovascular: Denies chest pain. Respiratory: Denies shortness of breath. Gastrointestinal: No abdominal pain.  No nausea, no vomiting.  No diarrhea.  No  constipation. Genitourinary: Negative for dysuria. Musculoskeletal: Negative for back pain. Skin: Negative for rash. Neurological: Negative for headaches, focal weakness or numbness.  10-point ROS otherwise negative.  ____________________________________________   PHYSICAL EXAM:  VITAL SIGNS: ED Triage Vitals  Enc Vitals Group     BP 08/11/15 0156 134/89 mmHg     Pulse Rate 08/11/15 0156 67     Resp 08/11/15 0156 18     Temp 08/11/15 0156 97.8 F (36.6 C)     Temp Source 08/11/15 0156 Oral     SpO2 08/11/15 0156 92 %     Weight 08/11/15 0156 254 lb (115.214 kg)     Height 08/11/15 0156 6\' 2"  (1.88 m)     Head Cir --      Peak Flow --      Pain Score 08/11/15 0157 0     Pain Loc --      Pain Edu? --      Excl. in [place]? --     Constitutional: Alert and oriented. Well appearing and in no acute distress. Eyes: Conjunctivae are normal. PERRL. EOMI. Head: Atraumatic. Nose: No congestion/rhinnorhea. Mouth/Throat: Mucous membranes are moist.  Oropharynx non-erythematous. Cardiovascular: Normal rate, regular rhythm. Grossly normal heart sounds.  Good peripheral circulation. Respiratory: Normal respiratory effort.  No retractions. Lungs CTAB. Gastrointestinal: Soft and nontender. No distention.  Musculoskeletal: No lower extremity tenderness nor edema.   Neurologic:  Normal speech and language. No gross focal neurologic deficits are appreciated. No gait instability. Skin:  Skin is warm, dry 10 cm laceration to right lateral thigh that appears with a slight avulsion and irregular leg injury  Psychiatric: Mood and affect are normal.   ____________________________________________   LABS (all labs ordered are listed, but only abnormal results are displayed)  Labs Reviewed  CBC - Abnormal; Notable for the following:    RBC 4.32 (*)    All other components within normal limits  COMPREHENSIVE METABOLIC PANEL - Abnormal; Notable for the following:    CO2 21 (*)    Glucose, Bld  156 (*)    Calcium 8.6 (*)    All other components within normal limits  ETHANOL - Abnormal; Notable for the following:    Alcohol, Ethyl (B) 164 (*)    All other components within normal limits  TROPONIN I   ____________________________________________  EKG  ED ECG REPORT I, Loney Hering, the attending physician, personally viewed and interpreted this ECG.   Date: 08/11/2015  EKG Time: 257  Rate: 71  Rhythm: normal sinus rhythm  Axis: left axis deviation  Intervals:none  ST&T Change: flipped t waves I and avl similar to 2/16  ____________________________________________  RADIOLOGY  none ____________________________________________   PROCEDURES  Procedure(s) performed: please, see procedure note(s).   LACERATION REPAIR Performed by: Charlesetta Ivory P Authorized by: Charlesetta Ivory P Consent: Verbal consent obtained. Risks and benefits: risks, benefits and alternatives were discussed Consent given by: patient Patient identity confirmed: provided demographic data Prepped and Draped in normal sterile fashion Wound explored  Laceration Location: Right lateral leg  Laceration Length: 10cm  No Foreign Bodies seen or palpated  Anesthesia: local infiltration  Local anesthetic: lidocaine 1% without epinephrine  Anesthetic total: 5 ml  Irrigation method: syringe Amount of cleaning: standard  Skin closure: 4. 0 Ethilon  Number of sutures: 11   Technique: Simple interrupted   Patient tolerance: Patient tolerated the procedure well with no immediate complications.    Critical Care performed: No  ____________________________________________   INITIAL IMPRESSION / ASSESSMENT AND PLAN / ED COURSE  Pertinent labs & imaging results that were available during my care of the patient were reviewed by me and considered in my medical decision making (see chart for details).  This is a 75 year old male who comes into the hospital today with a  laceration to his leg. The patient reports that he became dizzy and stood and then developed this injury. The patient was drinking last night and hasn't ethyl alcohol level of 164. I did suture the patient's injury after cleaning it appropriately. The patient reports that he feels well at this time is having no complaints. The patient will be discharged home to have the sutures removed in approximately 10 days. The patient has no further complaints or concerns. ____________________________________________   FINAL CLINICAL IMPRESSION(S) / ED DIAGNOSES  Final diagnoses:  Dizziness  Laceration  Contusion      Loney Hering, MD 08/11/15 [phone]

## 2015-08-11 NOTE — Discharge Instructions (Signed)

## 2015-08-12 DIAGNOSIS — Z5189 Encounter for other specified aftercare: Secondary | ICD-10-CM | POA: Diagnosis not present

## 2015-08-20 DIAGNOSIS — Z5189 Encounter for other specified aftercare: Secondary | ICD-10-CM | POA: Diagnosis not present

## 2015-08-26 DIAGNOSIS — L03115 Cellulitis of right lower limb: Secondary | ICD-10-CM | POA: Diagnosis not present

## 2015-08-26 DIAGNOSIS — E119 Type 2 diabetes mellitus without complications: Secondary | ICD-10-CM | POA: Diagnosis not present

## 2015-08-26 DIAGNOSIS — Z5189 Encounter for other specified aftercare: Secondary | ICD-10-CM | POA: Diagnosis not present

## 2015-09-01 DIAGNOSIS — Z0000 Encounter for general adult medical examination without abnormal findings: Secondary | ICD-10-CM | POA: Diagnosis not present

## 2015-09-01 DIAGNOSIS — E782 Mixed hyperlipidemia: Secondary | ICD-10-CM | POA: Diagnosis not present

## 2015-09-01 DIAGNOSIS — I25810 Atherosclerosis of coronary artery bypass graft(s) without angina pectoris: Secondary | ICD-10-CM | POA: Diagnosis not present

## 2015-09-01 DIAGNOSIS — I10 Essential (primary) hypertension: Secondary | ICD-10-CM | POA: Diagnosis not present

## 2015-09-10 ENCOUNTER — Ambulatory Visit (INDEPENDENT_AMBULATORY_CARE_PROVIDER_SITE_OTHER): Payer: Medicare Other | Admitting: Family Medicine

## 2015-09-10 ENCOUNTER — Encounter: Payer: Self-pay | Admitting: Family Medicine

## 2015-09-10 VITALS — BP 132/77 | HR 84 | Temp 97.8°F | Resp 17 | Ht 74.0 in | Wt 271.6 lb

## 2015-09-10 DIAGNOSIS — L089 Local infection of the skin and subcutaneous tissue, unspecified: Secondary | ICD-10-CM

## 2015-09-10 DIAGNOSIS — S81801A Unspecified open wound, right lower leg, initial encounter: Secondary | ICD-10-CM

## 2015-09-10 DIAGNOSIS — S81801D Unspecified open wound, right lower leg, subsequent encounter: Secondary | ICD-10-CM

## 2015-09-10 MED ORDER — MOXIFLOXACIN HCL 400 MG PO TABS: 400.0000 mg | ORAL_TABLET | Freq: Every day | ORAL | Status: DC

## 2015-09-10 NOTE — Progress Notes (Signed)
Name: Seth EICHENBERG Sr.   MRN: [id number]    DOB: 1941/03/01   Date:09/10/2015       Progress Note  Subjective  Chief Complaint  Chief Complaint  Patient presents with  . Wound Check    3 mo f/u    Wound Check He was originally treated more than 14 days ago. Previous treatment included laceration repair. The maximum temperature noted was less than 100.4 F. There has been bloody and colored discharge from the wound. The redness has improved. The swelling has improved. The pain has improved. He has no difficulty moving the affected extremity or digit.    Past Medical History  Diagnosis Date  . Coronary artery disease   . Hypertension   . Arthritis     rheumatoid   . GERD (gastroesophageal reflux disease)   . Urgency of urination   . Diabetes mellitus without complication ([place])   . Sleeping difficulty     takes med nightly  . Depression   . Localized osteoarthritis of right knee 05/21/2012  . Reflux     Past Surgical History  Procedure Laterality Date  . Foot surgery      x2  . Back surgery    . Coronary artery bypass graft  2004    x4 vessels  . Foot surgery      x2  . Transurethral resection of prostate    . 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 Kidney disease Neg Hx   . Bladder Cancer Brother   . Prostate cancer Neg Hx     Social History   Social History  . Marital Status: Widowed    Spouse Name: N/A  . Number of Children: N/A  . Years of Education: N/A   Occupational History  . Not on file.   Social History Main Topics  . Smoking status: Former Smoker    Quit date: 05/14/1992  . Smokeless tobacco: Not on file  . Alcohol Use: 0.0 oz/week    0 Standard drinks or equivalent per week     Comment: moderatly  . Drug Use: No  . Sexual Activity: Not on file   Other Topics  Concern  . Not on file   Social History Narrative     Current outpatient prescriptions:  .  Ascorbic Acid (VITAMIN C) 1000 MG tablet, Take 2,000 mg by mouth daily., Disp: , Rfl:  .  aspirin EC 81 MG tablet, Take 81 mg by mouth daily., Disp: , Rfl:  .  benzonatate (TESSALON) 200 MG capsule, Reported on 06/14/2015, Disp: , Rfl: 0 .  buPROPion (WELLBUTRIN XL) 300 MG 24 hr tablet, TAKE 1 TABLET DAILY, Disp: 90 tablet, Rfl: 3 .  busPIRone (BUSPAR) 15 MG tablet, TAKE 1 TABLET TWICE A DAY, Disp: 180 tablet, Rfl: 3 .  Cholecalciferol (VITAMIN D) 2000 UNITS tablet, Take 2,000 Units by mouth daily., Disp: , Rfl:  .  clonazePAM (KLONOPIN) 0.5 MG tablet, Take 1 tablet (0.5 mg total) by mouth 2 (two) times daily as needed. anxiety, Disp: 180 tablet, Rfl: 0 .  Coenzyme Q10 (CO Q 10) 100 MG CAPS, Take 1 capsule by mouth daily., Disp: , Rfl:  .  fish oil-omega-3 fatty acids 1000 MG capsule, Take 2 g by mouth daily., Disp: , Rfl:  .  fluticasone (FLONASE)  50 MCG/ACT nasal spray, Place 2 sprays into the nose daily., Disp: , Rfl:  .  irbesartan (AVAPRO) 150 MG tablet, Take 1 tablet (150 mg total) by mouth daily., Disp: 90 tablet, Rfl: 0 .  metFORMIN (GLUCOPHAGE) 1000 MG tablet, TAKE 1 TABLET TWICE A DAY WITH MEALS, Disp: 180 tablet, Rfl: 3 .  Multiple Vitamin (MULTIVITAMIN WITH MINERALS) TABS, Take 1 tablet by mouth daily., Disp: , Rfl:  .  Multiple Vitamins-Calcium (CALCI-MAX PO), Take 1 tablet by mouth daily. Reported on 06/02/2015, Disp: , Rfl:  .  pantoprazole (PROTONIX) 40 MG tablet, Take 1 tablet (40 mg total) by mouth daily., Disp: 90 tablet, Rfl: 0 .  sildenafil (VIAGRA) 50 MG tablet, Take 1 tablet (50 mg total) by mouth daily as needed for erectile dysfunction. 1 tablet po 30 mins prior to sexual activity, Disp: 10 tablet, Rfl: 0 .  solifenacin (VESICARE) 10 MG tablet, Take 1 tablet (10 mg total) by mouth daily., Disp: 90 tablet, Rfl: 3 .  solifenacin (VESICARE) 10 MG tablet, Take 1 tablet (10 mg total)  by mouth daily., Disp: 90 tablet, Rfl: 3 .  Suvorexant (BELSOMRA) 10 MG TABS, Take 1 tablet by mouth at bedtime as needed., Disp: 30 tablet, Rfl: 2 .  VYTORIN 10-20 MG tablet, TAKE 1 TABLET DAILY AT 6 P.M, Disp: 90 tablet, Rfl: 3  No Known Allergies   Review of Systems  Constitutional: Negative for fever and chills.     Objective  Filed Vitals:   09/10/15 0948  BP: 132/77  Pulse: 84  Temp: 97.8 F (36.6 C)  TempSrc: Oral  Resp: 17  Height: 6\' 2"  (1.88 m)  Weight: 271 lb 9.6 oz (123.197 kg)  SpO2: 96%    Physical Exam  Skin:     Open wound on the lateral right leg at the level of distal gastrocnemius, skin denuded, yellowish discharge at the edges of the wound, area is erythematous and warm to touch, no tenderness on palpation.  Nursing note and vitals reviewed.    Assessment & Plan  1. Traumatic open wound of right lower leg with infection, subsequent encounter Will start on broader-spectrum antibiotic for possible wound infection and refer to wound care for further management. Advised to keep the wound covered with light, non-occlusive dressing. - moxifloxacin (AVELOX) 400 MG tablet; Take 1 tablet (400 mg total) by mouth daily at 8 pm.  Dispense: 7 tablet; Refill: 0 - AMB referral to wound care center   [facility] 09/10/2015 9:54 AM

## 2015-09-13 ENCOUNTER — Telehealth: Payer: Self-pay | Admitting: Family Medicine

## 2015-09-13 DIAGNOSIS — F5104 Psychophysiologic insomnia: Secondary | ICD-10-CM

## 2015-09-13 NOTE — Telephone Encounter (Signed)
Pt needs refill on Belsomra tabs 10 mg. Pt states he was only given this for 30 days and he needs it for 90 days. [facility]

## 2015-09-13 NOTE — Telephone Encounter (Signed)
Routed to Dr. Manuella Ghazi for refill adjustment

## 2015-09-14 NOTE — Telephone Encounter (Signed)
Belsomra is a controlled substance, it cannot be sent electronically or via fax to [facility]. Please confirm if he needs a printed prescription which he can pick up at our office.

## 2015-09-16 ENCOUNTER — Encounter: Payer: Medicare Other | Attending: Surgery | Admitting: Surgery

## 2015-09-16 ENCOUNTER — Other Ambulatory Visit: Payer: Self-pay | Admitting: Surgery

## 2015-09-16 DIAGNOSIS — L97213 Non-pressure chronic ulcer of right calf with necrosis of muscle: Secondary | ICD-10-CM | POA: Insufficient documentation

## 2015-09-16 DIAGNOSIS — E11622 Type 2 diabetes mellitus with other skin ulcer: Secondary | ICD-10-CM | POA: Diagnosis not present

## 2015-09-16 DIAGNOSIS — F329 Major depressive disorder, single episode, unspecified: Secondary | ICD-10-CM | POA: Insufficient documentation

## 2015-09-16 DIAGNOSIS — M1990 Unspecified osteoarthritis, unspecified site: Secondary | ICD-10-CM | POA: Diagnosis not present

## 2015-09-16 DIAGNOSIS — Z87891 Personal history of nicotine dependence: Secondary | ICD-10-CM | POA: Insufficient documentation

## 2015-09-16 DIAGNOSIS — K219 Gastro-esophageal reflux disease without esophagitis: Secondary | ICD-10-CM | POA: Diagnosis not present

## 2015-09-16 DIAGNOSIS — N400 Enlarged prostate without lower urinary tract symptoms: Secondary | ICD-10-CM | POA: Diagnosis not present

## 2015-09-16 DIAGNOSIS — I2510 Atherosclerotic heart disease of native coronary artery without angina pectoris: Secondary | ICD-10-CM | POA: Diagnosis not present

## 2015-09-16 DIAGNOSIS — G4730 Sleep apnea, unspecified: Secondary | ICD-10-CM | POA: Diagnosis not present

## 2015-09-16 DIAGNOSIS — M70861 Other soft tissue disorders related to use, overuse and pressure, right lower leg: Secondary | ICD-10-CM | POA: Diagnosis not present

## 2015-09-16 DIAGNOSIS — I10 Essential (primary) hypertension: Secondary | ICD-10-CM | POA: Insufficient documentation

## 2015-09-16 DIAGNOSIS — T148XXA Other injury of unspecified body region, initial encounter: Secondary | ICD-10-CM

## 2015-09-16 DIAGNOSIS — S81801A Unspecified open wound, right lower leg, initial encounter: Secondary | ICD-10-CM | POA: Diagnosis not present

## 2015-09-16 NOTE — Progress Notes (Addendum)
Seth Mcintosh, Seth Mcintosh ([id number]) Visit Report for 09/16/2015 Chief Complaint Document Details Patient Name: Seth Mcintosh, Seth Mcintosh Date of Service: 09/16/2015 9:30 AM Medical Record Number: [id number] Patient Account Number: [id number] Date of Birth/Sex: 02-Aug-1940 (75 y.o. Male) Treating RN: Ahmed Prima Primary Care Physician: [facility], Dossie Der Other Clinician: Referring Physician: Keith Rake Treating Physician/Extender: Frann Rider in Treatment: 0 Information Obtained from: Patient Chief Complaint Patient seen for complaints of Non-Healing Wound which she's had on the right lower extremity for about 5 weeks Electronic Signature(s) Signed: 09/16/2015 10:42:31 AM By: Christin Fudge MD, FACS Entered By: Christin Fudge on 09/16/2015 10:42:31 Seth Mcintosh ([id number]) -------------------------------------------------------------------------------- HPI Details Patient Name: Seth Mcintosh Date of Service: 09/16/2015 9:30 AM Medical Record Number: [id number] Patient Account Number: [id number] Date of Birth/Sex: 03/17/1941 (76 y.o. Male) Treating RN: Ahmed Prima Primary Care Physician: [facility], Dossie Der Other Clinician: Referring Physician: [facility], Dossie Der Treating Physician/Extender: Frann Rider in Treatment: 0 History of Present Illness Location: open wound right lower extremity lateral leg Quality: Patient reports experiencing a dull pain to affected area(s). Severity: Patient states wound are getting worse. Duration: Patient has had the wound for > 6 weeks prior to seeking treatment at the wound center Timing: Pain in wound is Intermittent (comes and goes Context: The wound occurred when the patient had a injury with a blunt object around 08/07/2015 Modifying Factors: a large swelling with drainage Associated Signs and Symptoms: Patient reports having increase swelling. HPI Description: 75 year old gentleman with known history of diabetes mellitus without complication had  a lacerated wound to his right lower extremity which was repaired about 4 weeks ago. Was seen by his PCP Dr. Donneta Romberg with a open wound on the right lateral leg with some discharge and erythema and he was placed on a blocks and local dressing was applied and referred to [place] for further care. Past medical history significant for diabetes mellitus type 2, coronary artery disease, hypertension, arthritis, depression, foot surgery o2, back surgery, CABG, TURP, right total knee replacement. He is a former smoker and quit in 1994. On questioning the patient he gives a history that he had a blunt injury to his right leg and this happened on April 15 of this year and about 2 weeks later he had blood draining from here and had to go to the ER and have sutures placed. However on looking at his medical records he was in the ER on 05/17/2015 with a blunt injury on his right lower extremity and at that stage x-ray showed no fracture and the right lower leg ultrasound showed no evidence of DVT. Diagnosis of a hematoma was made and he was given symptomatic treatment for this. the next time he was seen in the emergency room was on 08/11/2015 when the patient had a 10 cm laceration to the right lateral leg and this was sutured by the ER physician Dr. Dahlia Client. his blood alcohol level was 162 mg percent that day and the sutures were to be removed in 10 days' time. Electronic Signature(s) Signed: 09/16/2015 10:56:47 AM By: Christin Fudge MD, FACS Previous Signature: 09/16/2015 10:43:47 AM Version By: Christin Fudge MD, FACS Previous Signature: 09/16/2015 9:34:17 AM Version By: Christin Fudge MD, FACS Entered By: Christin Fudge on 09/16/2015 10:56:47 Seth Mcintosh ([id number]) -------------------------------------------------------------------------------- Physical Exam Details Patient Name: Seth Mcintosh Date of Service: 09/16/2015 9:30 AM Medical Record Number: [id number] Patient Account Number: [id number] Date  of Birth/Sex: March 21, 1941 (75 y.o. Male) Treating RN: Ahmed Prima Primary Care Physician: Keith Rake Other Clinician:  Referring Physician: [facility], [facility] Treating Physician/Extender: Frann Rider in Treatment: 0 Constitutional . Pulse regular. Respirations normal and unlabored. Afebrile. . Eyes Nonicteric. Reactive to light. Ears, Nose, Mouth, and Throat Lips, teeth, and gums WNL.Marland Kitchen Moist mucosa without lesions. Neck supple and nontender. No palpable supraclavicular or cervical adenopathy. Normal sized without goiter. Respiratory WNL. No retractions.. Cardiovascular Pedal Pulses WNL. ABI on the left is 1.03 and the right is 1.11. No clubbing, cyanosis or edema. Chest Breasts symmetical and no nipple discharge.. Breast tissue WNL, no masses, lumps, or tenderness.. Gastrointestinal (GI) Abdomen without masses or tenderness.. No liver or spleen enlargement or tenderness.. Lymphatic No adneopathy. No adenopathy. No adenopathy. Musculoskeletal Adexa without tenderness or enlargement.. Digits and nails w/o clubbing, cyanosis, infection, petechiae, ischemia, or inflammatory conditions.. Integumentary (Hair, Skin) No suspicious lesions. No crepitus or fluctuance. No peri-wound warmth or erythema. No masses.Marland Kitchen Psychiatric Judgement and insight Intact.. No evidence of depression, anxiety, or agitation.. Notes the right lateral compartment has a swelling and possible a deep-seated hematoma. There is also a open wound which probes down with some serous sanguinous fluid coming out. There is also undermining anteriorly. No evidence of purulent material or gross cellulitis Electronic Signature(s) Signed: 09/16/2015 11:18:55 AM By: Christin Fudge MD, FACS Entered By: Christin Fudge on 09/16/2015 11:18:55 Seth Mcintosh ([id number]) Seth Mcintosh, Seth Mcintosh ([id number]) -------------------------------------------------------------------------------- Physician Orders Details Patient Name:  Seth Mcintosh Date of Service: 09/16/2015 9:30 AM Medical Record Number: [id number] Patient Account Number: [id number] Date of Birth/Sex: August 09, 1940 (75 y.o. Male) Treating RN: Ahmed Prima Primary Care Physician: [facility], Dossie Der Other Clinician: Referring Physician: [facility], [facility] Treating Physician/Extender: Frann Rider in Treatment: 0 Verbal / Phone Orders: Yes ClinicianCarolyne Fiscal, Debi Read Back and Verified: Yes Diagnosis Coding Wound Cleansing Wound #1 Right,Lateral Lower Leg o Cleanse wound with mild soap and water o May Shower, gently pat wound dry prior to applying new dressing. Anesthetic Wound #1 Right,Lateral Lower Leg o Topical Lidocaine 4% cream applied to wound bed prior to debridement Skin Barriers/Peri-Wound Care Wound #1 Right,Lateral Lower Leg o Skin Prep Primary Wound Dressing Wound #1 Right,Lateral Lower Leg o Aquacel Ag - rope Secondary Dressing Wound #1 Right,Lateral Lower Leg o Boardered Foam Dressing Dressing Change Frequency Wound #1 Right,Lateral Lower Leg o Change dressing every day. Edema Control Wound #1 Right,Lateral Lower Leg o Elevate legs to the level of the heart and pump ankles as often as possible Additional Orders / Instructions Wound #1 Right,Lateral Lower Leg o Increase protein intake. [place] ([id number]) o ultrasound - right lateral leg (for hematoma) Electronic Signature(s) Signed: 09/16/2015 4:02:17 PM By: Christin Fudge MD, FACS Signed: 09/17/2015 4:37:51 PM By: Alric Quan Entered By: Alric Quan on 09/16/2015 10:34:09 Seth Mcintosh ([id number]) -------------------------------------------------------------------------------- Problem List Details Patient Name: Seth Mcintosh Date of Service: 09/16/2015 9:30 AM Medical Record Number: [id number] Patient Account Number: [id number] Date of Birth/Sex: [birth year] (75 y.o. 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 M70.861 Other soft tissue disorders related to use, overuse and 09/16/2015 Yes pressure, right lower leg Inactive Problems Resolved Problems Electronic Signature(s) Signed: 09/16/2015 10:42:00 AM By: Christin Fudge MD, FACS Previous Signature: 09/16/2015 10:41:53 AM Version By: Christin Fudge MD, FACS  Entered By: Christin Fudge on 09/16/2015 10:42:00 Seth Mcintosh ([id number]) -------------------------------------------------------------------------------- Progress Note Details Patient Name: Seth Mcintosh Date of Service: 09/16/2015 9:30 AM Medical Record Number: [id number] Patient Account Number: [id number] Date of Birth/Sex: 1941-03-10 (75 y.o. Male) Treating RN: Ahmed Prima Primary Care Physician: [facility], Dossie Der Other Clinician: Referring Physician: [facility], Dossie Der Treating Physician/Extender: Frann Rider in Treatment: 0 Subjective Chief Complaint Information obtained from Patient Patient seen for complaints of Non-Healing Wound which she's had on the right lower extremity for about 5 weeks History of Present Illness (HPI) The following HPI elements were documented for the patient's wound: Location: open wound right lower extremity lateral leg Quality: Patient reports experiencing a dull pain to affected area(s). Severity: Patient states wound are getting worse. Duration: Patient has had the wound for > 6 weeks prior to seeking treatment at the wound center Timing: Pain in wound is Intermittent (comes and goes Context: The wound occurred when the patient had a injury with a blunt object around  08/07/2015 Modifying Factors: a large swelling with drainage Associated Signs and Symptoms: Patient reports having increase swelling. 75 year old gentleman with known history of diabetes mellitus without complication had a lacerated wound to his right lower extremity which was repaired about 4 weeks ago. Was seen by his PCP Dr. Donneta Romberg with a open wound on the right lateral leg with some discharge and erythema and he was placed on a blocks and local dressing was applied and referred to [place] for further care. Past medical history significant for diabetes mellitus type 2, coronary artery disease, hypertension, arthritis, depression, foot surgery o2, back surgery, CABG, TURP, right total knee replacement. He is a former smoker and quit in 1994. On questioning the patient he gives a history that he had a blunt injury to his right leg and this happened on April 15 of this year and about 2 weeks later he had blood draining from here and had to go to the ER and have sutures placed. However on looking at his medical records he was in the ER on 05/17/2015 with a blunt injury on his right lower extremity and at that stage x-ray showed no fracture and the right lower leg ultrasound showed no evidence of DVT. Diagnosis of a hematoma was made and he was given symptomatic treatment for this. the next time he was seen in the emergency room was on 08/11/2015 when the patient had a 10 cm laceration to the right lateral leg and this was sutured by the ER physician Dr. Dahlia Client. his blood alcohol level was 162 mg percent that day and the sutures were to be removed in 10 days' time. Wound History Patient presents with 1 open wound that has been present for approximately since 08/07/15. Patient has been treating wound in the following manner: neosporin, clean bandage. Laboratory tests have not been [place], [place] ([id number]) performed in the last month. Patient reportedly has not tested positive for an antibiotic  resistant organism. Patient reportedly has not tested positive for osteomyelitis. Patient reportedly has had testing performed to evaluate circulation in the legs. Patient experiences the following problems associated with their wounds: infection, swelling. Patient History Information obtained from Patient. Allergies NKDA Family History Cancer - Mother, Siblings, Diabetes - Maternal Grandparents, Siblings, Heart Disease - Father, Siblings, Kidney Disease - Siblings, No family history of Hereditary Spherocytosis, Hypertension, Lung Disease, Seizures, Stroke, Thyroid Problems, Tuberculosis. Social History Former smoker - quit 30 yrs ago, Marital Status - Widowed, Alcohol Use - Rarely, Drug Use -  No History, Caffeine Use - Daily. Medical History Eyes Patient has history of Glaucoma Respiratory Patient has history of Sleep Apnea Cardiovascular Patient has history of Coronary Artery Disease, Hypertension Endocrine Patient has history of Type II Diabetes Musculoskeletal Patient has history of Osteoarthritis Patient is treated with Oral Agents. Blood sugar is not tested. Review of Systems (ROS) Constitutional Symptoms (General Health) The patient has no complaints or symptoms. Eyes Complains or has symptoms of Glasses / Contacts - glasses. Ear/Nose/Mouth/Throat The patient has no complaints or symptoms. Hematologic/Lymphatic The patient has no complaints or symptoms. Cardiovascular had open heart surgery athroschrosis of coronary artery Gastrointestinal GERD Genitourinary Complains or has symptoms of Incontinence/dribbling, BPH erectile dysfunction Seth Mcintosh, Seth Mcintosh ([id number]) Immunological The patient has no complaints or symptoms. Integumentary (Skin) Complains or has symptoms of Wounds. Neurologic The patient has no complaints or symptoms. Oncologic The patient has no complaints or symptoms. Psychiatric Complains or has symptoms of Anxiety. Medications buspirone 15  mg tablet oral tablet oral clonazepam 0.5 mg tablet oral tablet oral metformin 500 mg tablet oral tablet oral Vytorin 10 mg-20 mg tablet oral tablet oral irbesartan 150 mg tablet oral tablet oral Co Q-10 (with Vit E) 100 mg-5 unit capsule oral capsule oral om-3-dha-epa-fish oil-vit D3 300 mg-1,000 mg-1,000 unit capsule oral capsule oral Daily Multivitamin-Minerals tablet oral tablet oral fluticasone 50 mcg/actuation nasal spray,suspension nasal spray,suspension nasal Adult Low Dose Aspirin 81 mg tablet,delayed release oral tablet,delayed release (DR/EC) oral pantoprazole 20 mg tablet,delayed release oral tablet,delayed release (DR/EC) oral Belsomra 10 mg tablet oral tablet oral Vesicare 10 mg tablet oral tablet oral Vitamin C 1,000 mg tablet oral tablet oral cholecalciferol (vitamin D3) 2,000 unit capsule oral capsule oral Objective Constitutional Pulse regular. Respirations normal and unlabored. Afebrile. Vitals Time Taken: 9:23 AM, Height: 74 in, Source: Stated, Weight: 265 lbs, Source: Stated, BMI: 34, Temperature: 97.5 F, Pulse: 56 bpm, Respiratory Rate: 18 breaths/min, Blood Pressure: 126/71 mmHg. Eyes Nonicteric. Reactive to light. Ears, Nose, Mouth, and Throat Lips, teeth, and gums WNL.Marland Kitchen Moist mucosa without lesions. [place], [place] ([id number]) Neck supple and nontender. No palpable supraclavicular or cervical adenopathy. Normal sized without goiter. Respiratory WNL. No retractions.. Cardiovascular Pedal Pulses WNL. ABI on the left is 1.03 and the right is 1.11. No clubbing, cyanosis or edema. Chest Breasts symmetical and no nipple discharge.. Breast tissue WNL, no masses, lumps, or tenderness.. Gastrointestinal (GI) Abdomen without masses or tenderness.. No liver or spleen enlargement or tenderness.. Lymphatic No adneopathy. No adenopathy. No adenopathy. Musculoskeletal Adexa without tenderness or enlargement.. Digits and nails w/o clubbing, cyanosis, infection,  petechiae, ischemia, or inflammatory conditions.Marland Kitchen Psychiatric Judgement and insight Intact.. No evidence of depression, anxiety, or agitation.. General Notes: the right lateral compartment has a swelling and possible a deep-seated hematoma. There is also a open wound which probes down with some serous sanguinous fluid coming out. There is also undermining anteriorly. No evidence of purulent material or gross cellulitis Integumentary (Hair, Skin) No suspicious lesions. No crepitus or fluctuance. No peri-wound warmth or erythema. No masses.. Wound #1 status is Open. Original cause of wound was Trauma. The wound is located on the Right,Lateral Lower Leg. The wound measures 2.5cm length x 2cm width x 0.7cm depth; 3.927cm^2 area and 2.749cm^3 volume. The wound is limited to skin breakdown. There is no tunneling noted, however, there is undermining starting at 12:00 and ending at 6:00 with a maximum distance of 4.2cm. There is a large amount of serosanguineous drainage noted. The wound margin is thickened. There is small (  1-33%) granulation within the wound bed. There is a large (67-100%) amount of necrotic tissue within the wound bed including Adherent Slough. The periwound skin appearance exhibited: Localized Edema, Moist. Periwound temperature was noted as No Abnormality. The periwound has tenderness on palpation. 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 At some stage he's had a blunt injury to his right lower extremity and at a later date he's had a lacerated wound. This was sutured in the ER on April 19 and since then he has  been leaking serosanguineous fluid from his wound and there may be a residual hematoma clinically. I have recommended: 1. Packing the wound gently with Aquacel and putting a bordered foam over this 2. We'll get a ultrasound of the soft tissue to look for a organized hematoma or seroma which is deep in this area. 3. Depending on the above he may need surgical debridement in the OR. 4. He will ultimately benefit from a wound VAC application. The patient understood the treatment plan and will be compliant. Plan Wound Cleansing: Wound #1 Right,Lateral Lower Leg: Cleanse wound with mild soap and water May Shower, gently pat wound dry prior to applying new dressing. Anesthetic: Wound #1 Right,Lateral Lower Leg: Topical Lidocaine 4% cream applied to wound bed prior to debridement Skin Barriers/Peri-Wound Care: Wound #1 Right,Lateral Lower Leg: Skin Prep Primary Wound Dressing: Wound #1 Right,Lateral Lower Leg: Aquacel Ag - rope Secondary Dressing: Wound #1 Right,Lateral Lower Leg: Boardered Foam Dressing Dressing Change Frequency: Wound #1 Right,Lateral Lower Leg: Change dressing every day. Edema Control: Seth Mcintosh, Seth Mcintosh ([id number]) Wound #1 Right,Lateral Lower Leg: Elevate legs to the level of the heart and pump ankles as often as possible Additional Orders / Instructions: Wound #1 Right,Lateral Lower Leg: Increase protein intake. ordered were: ultrasound - right lateral leg (for hematoma) this 75 year old diabetic who was last hemoglobin A1c was less than 7, is a poor historian and his timeline doesn't add up. At some stage he's had a blunt injury to his right lower extremity and at a later date he's had a lacerated wound. This was sutured in the ER on April 19 and since then he has been leaking serosanguineous fluid from his wound and there may be a residual hematoma clinically. I have recommended: 1. Packing the wound gently with Aquacel and putting a bordered foam over  this 2. We'll get a ultrasound of the soft tissue to look for a organized hematoma or seroma which is deep in this area. 3. Depending on the above he may need surgical debridement in the OR. 4. He will ultimately benefit from a wound VAC application. The patient understood the treatment plan and will be compliant. Electronic Signature(s) Signed: 09/16/2015 11:21:26 AM By: Christin Fudge MD, FACS Entered By: Christin Fudge on 09/16/2015 11:21:26 Seth Mcintosh ([id number]) -------------------------------------------------------------------------------- ROS/PFSH Details Patient Name: Seth Mcintosh Date of Service: 09/16/2015 9:30 AM Medical Record Number: [id number] Patient Account Number: [id number] Date of Birth/Sex: Jun 25, 1940 (75 y.o. Male) Treating RN: Ahmed Prima Primary Care Physician: [facility], Dossie Der Other Clinician: Referring Physician: [facility], Dossie Der Treating Physician/Extender: Frann Rider in Treatment: 0 Information Obtained From Patient Wound History Do you  currently have one or more open woundso Yes How many open wounds do you currently haveo 1 Approximately how long have you had your woundso since 08/07/15 How have you been treating your wound(s) until nowo neosporin, clean bandage Has your wound(s) ever healed and then re-openedo No Have you had any lab work done in the past montho No Have you tested positive for an antibiotic resistant organism (MRSA, No VRE)o Have you tested positive for osteomyelitis (bone infection)o No Have you had any tests for circulation on your legso Yes Who ordered the testo [facility] Where was the test doneo April 2017 Have you had other problems associated with your woundso Infection, Swelling Eyes Complaints and Symptoms: Positive for: Glasses / Contacts - glasses Medical History: Positive for: Glaucoma Genitourinary Complaints and Symptoms: Positive for: Incontinence/dribbling Review of System Notes: BPH erectile  dysfunction Integumentary (Skin) Complaints and Symptoms: Positive for: Wounds Psychiatric Seth Mcintosh, Seth Mcintosh ([id number]) Complaints and Symptoms: Positive for: Anxiety Constitutional Symptoms (General Health) Complaints and Symptoms: No Complaints or Symptoms Ear/Nose/Mouth/Throat Complaints and Symptoms: No Complaints or Symptoms Hematologic/Lymphatic Complaints and Symptoms: No Complaints or Symptoms Respiratory Medical History: Positive for: Sleep Apnea Cardiovascular Complaints and Symptoms: Review of System Notes: had open heart surgery athroschrosis of coronary artery Medical History: Positive for: Coronary Artery Disease; Hypertension Gastrointestinal Complaints and Symptoms: Review of System Notes: GERD Endocrine Medical History: Positive for: Type II Diabetes Time with diabetes: 5 yrs Treated with: Oral agents Blood sugar tested every day: No Immunological Complaints and Symptoms: No Complaints or Symptoms Seth Mcintosh, Seth Mcintosh ([id number]) Musculoskeletal Medical History: Positive for: Osteoarthritis Neurologic Complaints and Symptoms: No Complaints or Symptoms Oncologic Complaints and Symptoms: No Complaints or Symptoms HBO Extended History Items Eyes: Glaucoma Family and Social History Cancer: Yes - Mother, Siblings; Diabetes: Yes - Maternal Grandparents, Siblings; Heart Disease: Yes - Father, Siblings; Hereditary Spherocytosis: No; Hypertension: No; Kidney Disease: Yes - Siblings; Lung Disease: No; Seizures: No; Stroke: No; Thyroid Problems: No; Tuberculosis: No; Former smoker - quit 30 yrs ago; Marital Status - Widowed; Alcohol Use: Rarely; Drug Use: No History; Caffeine Use: Daily; Financial Concerns: No; Food, Clothing or Shelter Needs: No; Support System Lacking: No; Transportation Concerns: No; Advanced Directives: No; Patient does not want information on Advanced Directives; Do not resuscitate: No; Living Will: Yes (Not Provided); Medical  Power of Attorney: No Physician Affirmation I have reviewed and agree with the above information. Electronic Signature(s) Signed: 09/16/2015 10:03:39 AM By: Christin Fudge MD, FACS Signed: 09/17/2015 4:37:51 PM By: Alric Quan Entered By: Christin Fudge on 09/16/2015 10:03:39 Seth Mcintosh ([id number]) -------------------------------------------------------------------------------- Ward Details Patient Name: Seth Mcintosh Date of Service: 09/16/2015 Medical Record Number: [id number] Patient Account Number: [id number] Date of Birth/Sex: 13-Jan-1941 (75 y.o. 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 Other soft tissue disorders related to use, overuse and pressure, right lower leg Facility Procedures CPT4 Code: [id number] Description: 99214 - WOUND CARE VISIT-LEV 4 EST PT Modifier: Quantity: 1 Physician Procedures CPT4: Description Modifier Quantity Code [id number] - WC PHYS LEVEL 4 - NEW PT 1 ICD-10 Description Diagnosis 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

## 2015-09-18 NOTE — Progress Notes (Signed)
EL, PENSINGER ([id number]) Visit Report for 09/16/2015 Abuse/Suicide Risk Screen Details Patient Name: Seth Mcintosh, Seth Mcintosh Date of Service: 09/16/2015 9:30 AM Medical Record Number: [id number] Patient Account Number: [id number] Date of Birth/Sex: 1940-05-23 (75 y.o. Male) Treating RN: Ahmed Prima Primary Care Physician: [facility], Dossie Der Other Clinician: Referring Physician: [facility], [facility] Treating Physician/Extender: Frann Rider in Treatment: 0 Abuse/Suicide Risk Screen Items Answer ABUSE/SUICIDE RISK SCREEN: Has anyone close to you tried to hurt or harm you recentlyo No Do you feel uncomfortable with anyone in your familyo No Has anyone forced you do things that you didnot want to doo No Do you have any thoughts of harming yourselfo No Patient displays signs or symptoms of abuse and/or neglect. No Electronic Signature(s) Signed: 09/17/2015 4:37:51 PM By: Alric Quan Entered By: Alric Quan on 09/16/2015 09:42:45 Seth Mcintosh ([id number]) -------------------------------------------------------------------------------- Activities of Daily Living Details Patient Name: Seth Mcintosh Date of Service: 09/16/2015 9:30 AM Medical Record Number: [id number] Patient Account Number: [id number] Date of Birth/Sex: [birth year] (75 y.o. Male) Treating RN: Ahmed Prima Primary Care Physician: [facility], Dossie Der Other Clinician: Referring Physician: [facility], Dossie Der Treating Physician/Extender: Frann Rider in Treatment: 0 Activities of Daily Living Items Answer Activities of Daily Living (Please select one for each item) Drive Automobile Completely Able Take Medications Completely Able Use Telephone Completely [place] for Appearance Completely Able Use Toilet Completely Able Bath / Shower Completely Able Dress Self Completely Able Feed Self Completely Able Walk Completely Able Get In / Out Bed Completely Able Housework Completely Able Prepare Meals Completely [place] for Self Completely Able Electronic Signature(s) Signed: 09/17/2015 4:37:51 PM By: Alric Quan Entered By: Alric Quan on 09/16/2015 09:43:02 Seth Mcintosh ([id number]) -------------------------------------------------------------------------------- Education Assessment Details Patient Name: Seth Mcintosh Date of Service: 09/16/2015 9:30 AM Medical Record Number: [id number] Patient Account Number: [id number] Date of Birth/Sex: Nov 30, 1940 (75 y.o. Male) Treating RN: Ahmed Prima Primary Care Physician: [facility], Dossie Der Other Clinician: Referring Physician: [name], Dossie Der Treating Physician/Extender: Frann Rider in Treatment: 0 Primary Learner Assessed: Patient Learning Preferences/Education Level/Primary Language Learning Preference: Explanation Highest Education Level: College or Above Preferred Language: English Cognitive Barrier Assessment/Beliefs Language Barrier: No Translator Needed: No Memory Deficit: No Emotional Barrier: No Cultural/Religious Beliefs Affecting Medical No Care: Physical Barrier Assessment Impaired Vision: Yes Glasses Impaired Hearing: No Decreased Hand dexterity: No Knowledge/Comprehension Assessment Knowledge Level: High Comprehension Level: High Ability to understand written High instructions: Ability to understand verbal High instructions: Motivation Assessment Anxiety Level: Calm Cooperation: Cooperative Education Importance: Acknowledges Need Interest in Health Problems: Asks Questions Perception: Coherent Willingness to Engage in Self- High Management Activities: Readiness to Engage in Self- High Management Activities: Electronic Signature(s) Seth Mcintosh, Seth Mcintosh ([id number]) Signed: 09/17/2015 4:37:51 PM By: Alric Quan Entered By: Alric Quan on 09/16/2015 09:43:22 Seth Mcintosh ([id number]) -------------------------------------------------------------------------------- Fall  Risk Assessment Details Patient Name: Seth Mcintosh Date of Service: 09/16/2015 9:30 AM Medical Record Number: [id number] Patient Account Number: [id number] Date of Birth/Sex: [birth year] (75 y.o. Male) Treating RN: Ahmed Prima Primary Care Physician: [facility], Dossie Der Other Clinician: Referring Physician: Keith Rake Treating Physician/Extender: Frann Rider in Treatment: 0 Fall Risk Assessment Items Have you had 2 or more falls in the last 12 monthso 0 No Have you had any fall that resulted in injury in the last 12 monthso 0 Yes FALL RISK ASSESSMENT: History of falling - immediate or within 3 months 25 Yes Secondary diagnosis 0 No Ambulatory aid None/bed rest/wheelchair/nurse 0 No Crutches/cane/walker 0 No Furniture  0 No IV Access/Saline Lock 0 No Gait/Training Normal/bed rest/immobile 0 No Weak 0 No Impaired 0 No Mental Status Oriented to own ability 0 Yes Electronic Signature(s) Signed: 09/17/2015 4:37:51 PM By: Alric Quan Entered By: Alric Quan on 09/16/2015 09:44:31 Seth Mcintosh ([id number]) -------------------------------------------------------------------------------- Foot Assessment Details Patient Name: Seth Mcintosh Date of Service: 09/16/2015 9:30 AM Medical Record Number: [id number] Patient Account Number: [id number] Date of Birth/Sex: 1940-08-11 (75 y.o. Male) Treating RN: Ahmed Prima Primary Care Physician: [facility], Dossie Der Other Clinician: Referring Physician: [facility], [facility] Treating Physician/Extender: Frann Rider in Treatment: 0 Foot Assessment Items Site Locations + = Sensation present, - = Sensation absent, C = Callus, U = Ulcer R = Redness, W = Warmth, M = Maceration, PU = Pre-ulcerative lesion F = Fissure, S = Swelling, D = Dryness Assessment Right: Left: Other Deformity: No No Prior Foot Ulcer: No No Prior Amputation: No No Charcot Joint: No No Ambulatory Status: Ambulatory Without Help Gait: Steady Electronic  Signature(s) Signed: 09/17/2015 4:37:51 PM By: Alric Quan Entered By: Alric Quan on 09/16/2015 09:49:36 Seth Mcintosh ([id number]) -------------------------------------------------------------------------------- Nutrition Risk Assessment Details Patient Name: Seth Mcintosh Date of Service: 09/16/2015 9:30 AM Medical Record Number: [id number] Patient Account Number: [id number] Date of Birth/Sex: 1941/04/01 (75 y.o. Male) Treating RN: Ahmed Prima Primary Care Physician: [facility], Dossie Der Other Clinician: Referring Physician: [facility], [facility] Treating Physician/Extender: Frann Rider in Treatment: 0 Height (in): 74 Weight (lbs): 265 Body Mass Index (BMI): 34 Nutrition Risk Assessment Items NUTRITION RISK SCREEN: I have an illness or condition that made me change the kind and/or 0 No amount of food I eat I eat fewer than two meals per day 0 No I eat few fruits and vegetables, or milk products 0 No I have three or more drinks of beer, liquor or wine almost every day 0 No I have tooth or mouth problems that make it hard for me to eat 0 No I don't always have enough money to buy the food I need 0 No I eat alone most of the time 0 No I take three or more different prescribed or over-the-counter drugs a 1 Yes day Without wanting to, I have lost or gained 10 pounds in the last six 0 No months I am not always physically able to shop, cook and/or feed myself 0 No Nutrition Protocols Good Risk Protocol Moderate Risk Protocol Electronic Signature(s) Signed: 09/17/2015 4:37:51 PM By: Alric Quan Entered By: Alric Quan on 09/16/2015 09:44:39

## 2015-09-18 NOTE — Progress Notes (Signed)
Seth Mcintosh ([id number]) Visit Report for 09/16/2015 Allergy List Details Patient Name: Seth Mcintosh, Seth Mcintosh Date of Service: 09/16/2015 9:30 AM Medical Record Number: [id number] Patient Account Number: [id number] Date of Birth/Sex: November 04, 1940 (75 y.o. Male) Treating RN: Ahmed Prima Primary Care Physician: [facility], [place] Other Clinician: Referring Physician: [facility], [place] Treating Physician/Extender: Frann Rider in Treatment: 0 Allergies Active Allergies NKDA Allergy Notes Electronic Signature(s) Signed: 09/17/2015 4:37:51 PM By: Alric Quan Entered By: Alric Quan on 09/16/2015 09:28:29 Seth Mcintosh ([id number]) -------------------------------------------------------------------------------- [place] Information Details Patient Name: Seth Mcintosh Date of Service: 09/16/2015 9:30 AM Medical Record Number: [id number] Patient Account Number: [id number] Date of Birth/Sex: Sep 21, 1940 (75 y.o. Male) Treating RN: Ahmed Prima Primary Care Physician: [facility], [place] Other Clinician: Referring Physician: [facility], [place] Treating Physician/Extender: Frann Rider in Treatment: 0 Visit Information Patient Arrived: Ambulatory Arrival Time: 09:21 Accompanied By: self Transfer Assistance: None Patient Identification Verified: Yes Secondary Verification Process Yes Completed: Patient Requires Transmission-Based No Precautions: Patient Has Alerts: Yes Patient Alerts: DM II Electronic Signature(s) Signed: 09/17/2015 4:37:51 PM By: Alric Quan Entered By: Alric Quan on 09/16/2015 09:23:05 Seth Mcintosh ([id number]) -------------------------------------------------------------------------------- Clinic Level of Care Assessment Details Patient Name: Seth Mcintosh Date of Service: 09/16/2015 9:30 AM Medical Record Number: [id number] Patient Account Number: [id number] Date of Birth/Sex: 12-01-1940 (75 y.o. Male) Treating RN: Ahmed Prima Primary  Care Physician: [facility], [place] Other Clinician: Referring Physician: Keith Rake Treating Physician/Extender: Frann Rider in Treatment: 0 Clinic Level of Care Assessment Items TOOL 2 Quantity Score X - Use when only an EandM is performed on the INITIAL visit 1 0 ASSESSMENTS - Nursing Assessment / Reassessment X - General Physical Exam (combine w/ comprehensive assessment (listed just 1 20 below) when performed on new pt. evals) X - Comprehensive Assessment (HX, ROS, Risk Assessments, Wounds Hx, etc.) 1 25 ASSESSMENTS - Wound and Skin Assessment / Reassessment X - Simple Wound Assessment / Reassessment - one wound 1 5 []  - Complex Wound Assessment / Reassessment - multiple wounds 0 []  - Dermatologic / Skin Assessment (not related to wound area) 0 ASSESSMENTS - Ostomy and/or Continence Assessment and Care []  - Incontinence Assessment and Management 0 []  - Ostomy Care Assessment and Management (repouching, etc.) 0 PROCESS - Coordination of Care []  - Simple Patient / Family Education for ongoing care 0 X - Complex (extensive) Patient / Family Education for ongoing care 1 20 X - Staff obtains Programmer, systems, Records, Test Results / Process Orders 1 10 []  - Staff telephones HHA, Nursing Homes / Clarify orders / etc 0 []  - Routine Transfer to another Facility (non-emergent condition) 0 []  - Routine Hospital Admission (non-emergent condition) 0 []  - New Admissions / Biomedical engineer / Ordering NPWT, Apligraf, etc. 0 X - Emergency Hospital Admission (emergent condition) 1 20 X - Simple Discharge Coordination 1 10 JAILEN, KREISER. ([id number]) []  - Complex (extensive) Discharge Coordination 0 PROCESS - Special Needs []  - Pediatric / Minor Patient Management 0 []  - Isolation Patient Management 0 []  - Hearing / Language / Visual special needs 0 []  - Assessment of Community assistance (transportation, D/C planning, etc.) 0 []  - Additional assistance / Altered mentation 0 []  - Support  Surface(s) Assessment (bed, cushion, seat, etc.) 0 INTERVENTIONS - Wound Cleansing / Measurement X - Wound Imaging (photographs - any number of wounds) 1 5 []  - Wound Tracing (instead of photographs) 0 X - Simple Wound Measurement - one wound 1 5 []  - Complex Wound Measurement - multiple wounds 0  X - Simple Wound Cleansing - one wound 1 5 []  - Complex Wound Cleansing - multiple wounds 0 INTERVENTIONS - Wound Dressings []  - Small Wound Dressing one or multiple wounds 0 X - Medium Wound Dressing one or multiple wounds 1 15 []  - Large Wound Dressing one or multiple wounds 0 []  - Application of Medications - injection 0 INTERVENTIONS - Miscellaneous []  - External ear exam 0 []  - Specimen Collection (cultures, biopsies, blood, body fluids, etc.) 0 []  - Specimen(s) / Culture(s) sent or taken to Lab for analysis 0 []  - Patient Transfer (multiple staff / Harrel Lemon Lift / Similar devices) 0 []  - Simple Staple / Suture removal (25 or less) 0 []  - Complex Staple / Suture removal (26 or more) 0 Pepin, Matteus E. ([id number]) []  - Hypo / Hyperglycemic Management (close monitor of Blood Glucose) 0 X - Ankle / Brachial Index (ABI) - do not check if billed separately 1 15 Has the patient been seen at the hospital within the last three years: Yes Total Score: 155 Level Of Care: New/Established - Level 4 Electronic Signature(s) Signed: 09/17/2015 4:37:51 PM By: Alric Quan Entered By: Alric Quan on 09/16/2015 11:46:01 Seth Mcintosh ([id number]) -------------------------------------------------------------------------------- Encounter Discharge Information Details Patient Name: Seth Mcintosh Date of Service: 09/16/2015 9:30 AM Medical Record Number: [id number] Patient Account Number: [id number] Date of Birth/Sex: 28-Jan-1941 (75 y.o. Male) Treating RN: Ahmed Prima Primary Care Physician: [facility], [place] Other Clinician: Referring Physician: [facility], [facility] Treating Physician/Extender: Frann Rider in Treatment: 0 Encounter Discharge Information Items Discharge Pain Level: 0 Discharge Condition: Stable Ambulatory Status: Ambulatory Discharge Destination: Home Transportation: Private Auto Accompanied By: self Schedule Follow-up Appointment: Yes Medication Reconciliation completed and provided to Patient/Care No [place]: Provided on Clinical Summary of Care: 09/16/2015 Form Type Recipient Paper Patient JK Electronic Signature(s) Signed: 09/16/2015 10:46:08 AM By: Ruthine Dose Entered By: Ruthine Dose on 09/16/2015 10:46:08 Seth Mcintosh ([id number]) -------------------------------------------------------------------------------- Lower Extremity Assessment Details Patient Name: Seth Mcintosh Date of Service: 09/16/2015 9:30 AM Medical Record Number: [id number] Patient Account Number: [id number] Date of Birth/Sex: Mar 24, 1941 (75 y.o. Male) Treating RN: Ahmed Prima Primary Care Physician: [facility], [place] Other Clinician: Referring Physician: [facility], [place] Treating Physician/Extender: Frann Rider in Treatment: 0 Edema Assessment Assessed: [Left: No] [Right: No] Edema: [Left: Yes] [Right: Yes] Calf Left: Right: Point of Measurement: 36 cm From Medial Instep 44.8 cm cm Ankle Left: Right: Point of Measurement: 11 cm From Medial Instep 28 cm 26.5 cm Vascular Assessment Pulses: Posterior Tibial Dorsalis Pedis Palpable: [Left:Yes] [Right:Yes] Doppler: [Left:Monophasic] [Right:Monophasic] Extremity colors, hair growth, and conditions: Extremity Color: [Left:Normal] [Right:Normal] Hair Growth on Extremity: [Left:Yes] [Right:Yes] Temperature of Extremity: [Left:Warm] [Right:Warm] Capillary Refill: [Left:< 3 seconds] [Right:< 3 seconds] Blood Pressure: Brachial: [Left:126] [Right:126] Dorsalis Pedis: 130 [Left:Dorsalis Pedis: 120] Ankle: Posterior Tibial: 130 [Left:Posterior Tibial: 140 1.03] [Right:1.11] Electronic Signature(s) Signed:  09/17/2015 4:37:51 PM By: Alric Quan Entered By: Alric Quan on 09/16/2015 09:57:51 Seth Mcintosh ([id number]) -------------------------------------------------------------------------------- Multi Wound Chart Details Patient Name: Seth Mcintosh Date of Service: 09/16/2015 9:30 AM Medical Record Number: [id number] Patient Account Number: [id number] Date of Birth/Sex: 08-Apr-1941 (74 y.o. Male) Treating RN: Ahmed Prima Primary Care Physician: [facility], [place] Other Clinician: Referring Physician: [facility], [place] Treating Physician/Extender: Frann Rider in Treatment: 0 Vital Signs Height(in): 74 Pulse(bpm): 56 Weight(lbs): 265 Blood Pressure 126/71 (mmHg): Body Mass Index(BMI): 34 Temperature(F): 97.5 Respiratory Rate 18 (breaths/min): Photos: [1:No Photos] [N/A:N/A] Wound Location: [1:Right Lower Leg - Lateral] [N/A:N/A] Wounding Event: [1:Trauma] [N/A:N/A]  Primary Etiology: [1:Trauma, Other] [N/A:N/A] Comorbid History: [1:Glaucoma, Sleep Apnea, Coronary Artery Disease, Hypertension, Type II Diabetes, Osteoarthritis] [N/A:N/A] Date Acquired: [1:08/07/2015] [N/A:N/A] Weeks of Treatment: [1:0] [N/A:N/A] Wound Status: [1:Open] [N/A:N/A] Measurements L x W x D 2.5x2x0.7 [N/A:N/A] (cm) Area (cm) : [1:3.927] [N/A:N/A] Volume (cm) : [1:2.749] [N/A:N/A] Starting Position 1 12 (o'clock): Ending Position 1 [1:6] (o'clock): Maximum Distance 1 4.2 (cm): Undermining: [1:Yes] [N/A:N/A] Classification: [1:Full Thickness With Exposed Support Structures] [N/A:N/A] HBO Classification: [1:Grade 1] [N/A:N/A] Exudate Amount: [1:Large] [N/A:N/A] Exudate Type: [1:Serosanguineous] [N/A:N/A] Exudate Color: [1:red, brown] [N/A:N/A] Wound Margin: [1:Thickened] [N/A:N/A] Granulation Amount: Small (1-33%) N/A N/A Necrotic Amount: Large (67-100%) N/A N/A Exposed Structures: Fascia: No N/A N/A Fat: No Tendon: No Muscle: No Joint: No Bone: No Limited to  Skin Breakdown Epithelialization: None N/A N/A Periwound Skin Texture: Edema: Yes N/A N/A Periwound Skin Moist: Yes N/A N/A Moisture: Periwound Skin Color: No Abnormalities Noted N/A N/A Temperature: No Abnormality N/A N/A Tenderness on Yes N/A N/A Palpation: Wound Preparation: Ulcer Cleansing: N/A N/A Rinsed/Irrigated with Saline Topical Anesthetic Applied: Other: lidocaine 4% Treatment Notes Electronic Signature(s) Signed: 09/17/2015 4:37:51 PM By: Alric Quan Entered By: Alric Quan on 09/16/2015 10:23:19 Seth Mcintosh ([id number]) -------------------------------------------------------------------------------- Multi-Disciplinary Care Plan Details Patient Name: Seth Mcintosh Date of Service: 09/16/2015 9:30 AM Medical Record Number: [id number] Patient Account Number: [id number] Date of Birth/Sex: 07-22-1940 (75 y.o. Male) Treating RN: Ahmed Prima Primary Care Physician: [facility], [place] Der Other Clinician: Referring Physician: [facility], [place] Der Treating Physician/Extender: Frann Rider in Treatment: 0 Active Inactive Abuse / Safety / Falls / Self Care Management Nursing Diagnoses: Potential for falls Goals: Patient will remain injury free Date Initiated: 09/16/2015 Goal Status: Active Interventions: Assess fall risk on admission and as needed Notes: Orientation to the Wound Care Program Nursing Diagnoses: Knowledge deficit related to the wound healing center program Goals: Patient/caregiver will verbalize understanding of the [place] Program Date Initiated: 09/16/2015 Goal Status: Active Interventions: Provide education on orientation to the wound center Notes: Pain, Acute or Chronic Nursing Diagnoses: Pain, acute or chronic: actual or potential Potential alteration in comfort, pain Goals: SAIFULLAH, MARTELLO ([id number]) Patient will verbalize adequate pain control and receive pain control interventions during procedures  as needed Date Initiated: 09/16/2015 Goal Status: Active Interventions: Assess comfort goal upon admission Complete pain assessment as per visit requirements Notes: Soft Tissue Infection Nursing Diagnoses: Impaired tissue integrity Goals: Patient will remain free of wound infection Date Initiated: 09/16/2015 Goal Status: Active Interventions: Assess signs and symptoms of infection every visit Notes: Wound/Skin Impairment Nursing Diagnoses: Impaired tissue integrity Goals: Ulcer/skin breakdown will have a volume reduction of 30% by week 4 Date Initiated: 09/16/2015 Goal Status: Active Ulcer/skin breakdown will have a volume reduction of 50% by week 8 Date Initiated: 09/16/2015 Goal Status: Active Ulcer/skin breakdown will have a volume reduction of 80% by week 12 Date Initiated: 09/16/2015 Goal Status: Active Interventions: Assess patient/caregiver ability to obtain necessary supplies Assess ulceration(s) every visit Notes: ANGELINA, HANKO ([id number]) Electronic Signature(s) Signed: 09/17/2015 4:37:51 PM By: Alric Quan Entered By: Alric Quan on 09/16/2015 10:23:05 Seth Mcintosh ([id number]) -------------------------------------------------------------------------------- Pain Assessment Details Patient Name: Seth Mcintosh Date of Service: 09/16/2015 9:30 AM Medical Record Number: [id number] Patient Account Number: [id number] Date of Birth/Sex: 27-Feb-1941 (75 y.o. Male) Treating RN: Ahmed Prima Primary Care Physician: [facility], [place] Der Other Clinician: Referring Physician: Keith Rake Treating Physician/Extender: Frann Rider in Treatment: 0 Active Problems Location of Pain Severity and Description of Pain Patient Has Paino No Site  Locations Pain Management and Medication Current Pain Management: Electronic Signature(s) Signed: 09/17/2015 4:37:51 PM By: Alric Quan Entered By: Alric Quan on 09/16/2015 09:23:42 Seth Mcintosh  ([id number]) -------------------------------------------------------------------------------- Patient/Caregiver Education Details Patient Name: Seth Mcintosh Date of Service: 09/16/2015 9:30 AM Medical Record Number: [id number] Patient Account Number: [id number] Date of Birth/Gender: [birth year] (75 y.o. Male) Treating RN: Ahmed Prima Primary Care Physician: [facility], [place] Der Other Clinician: Referring Physician: [facility], [place] Der Treating Physician/Extender: Frann Rider in Treatment: 0 Education Assessment Education Provided To: Patient Education Topics Provided Wound/Skin Impairment: Handouts: Other: change dressing as ordered Methods: Demonstration, Explain/Verbal Responses: State content correctly Electronic Signature(s) Signed: 09/17/2015 4:37:51 PM By: Alric Quan Entered By: Alric Quan on 09/16/2015 10:10:46 Seth Mcintosh ([id number]) -------------------------------------------------------------------------------- Wound Assessment Details Patient Name: Seth Mcintosh Date of Service: 09/16/2015 9:30 AM Medical Record Number: [id number] Patient Account Number: [id number] Date of Birth/Sex: 1941/04/12 (75 y.o. Male) Treating RN: Ahmed Prima Primary Care Physician: [facility], [place] Der Other Clinician: Referring Physician: [facility], [place] Treating Physician/Extender: Frann Rider in Treatment: 0 Wound Status Wound Number: 1 Primary Trauma, Other Etiology: Wound Location: Right Lower Leg - Lateral Wound Open Wounding Event: Trauma Status: Date Acquired: 08/07/2015 Comorbid Glaucoma, Sleep Apnea, Coronary Weeks Of Treatment: 0 History: Artery Disease, Hypertension, Type II Clustered Wound: No Diabetes, Osteoarthritis Photos Photo Uploaded By: Alric Quan on 09/16/2015 16:26:33 Wound Measurements Length: (cm) 2.5 % Reduction in Width: (cm) 2 % Reduction in Depth: (cm) 0.7 Epithelializat Area: (cm) 3.927 Tunneling: Volume: (cm)  2.749 Undermining: Starting Po Ending Posi Maximum Dis Area: Volume: ion: None No Yes sition (o'clock): 12 tion (o'clock): 6 tance: (cm) 4.2 Wound Description Full Thickness With Exposed Classification: Support Structures Diabetic Severity Grade 1 (Wagner): Wound Margin: Thickened Exudate Amount: Large Exudate Type: Serosanguineous ISACK, BAAB ([id number]) Foul Odor After Cleansing: No Exudate Color: red, brown Wound Bed Granulation Amount: Small (1-33%) Exposed Structure Necrotic Amount: Large (67-100%) Fascia Exposed: No Necrotic Quality: Adherent Slough Fat Layer Exposed: No Tendon Exposed: No Muscle Exposed: No Joint Exposed: No Bone Exposed: No Limited to Skin Breakdown Periwound Skin Texture Texture Color No Abnormalities Noted: No No Abnormalities Noted: No Localized Edema: Yes Temperature / Pain Moisture Temperature: No Abnormality No Abnormalities Noted: No Tenderness on Palpation: Yes Moist: Yes Wound Preparation Ulcer Cleansing: Rinsed/Irrigated with Saline Topical Anesthetic Applied: Other: lidocaine 4%, Treatment Notes Wound #1 (Right, Lateral Lower Leg) 1. Cleansed with: Clean wound with Normal Saline 2. Anesthetic Topical Lidocaine 4% cream to wound bed prior to debridement 3. Peri-wound Care: Skin Prep 4. Dressing Applied: Aquacel Ag 5. Secondary Dressing Applied Bordered Foam Dressing Electronic Signature(s) Signed: 09/17/2015 4:37:51 PM By: Alric Quan Entered By: Alric Quan on 09/16/2015 10:04:45 Seth Mcintosh ([id number]) -------------------------------------------------------------------------------- [place] Details Patient Name: Seth Mcintosh Date of Service: 09/16/2015 9:30 AM Medical Record Number: [id number] Patient Account Number: [id number] Date of Birth/Sex: [birth year] (75 y.o. Male) Treating RN: Ahmed Prima Primary Care Physician: [facility], [place] Other Clinician: Referring Physician: [facility],  [facility] Treating Physician/Extender: Frann Rider in Treatment: 0 Vital Signs Time Taken: 09:23 Temperature (F): 97.5 Height (in): 74 Pulse (bpm): 56 Source: Stated Respiratory Rate (breaths/min): 18 Weight (lbs): 265 Blood Pressure (mmHg): 126/71 Source: Stated Reference Range: 80 - 120 mg / dl Body Mass Index (BMI): 34 Electronic Signature(s) Signed: 09/17/2015 4:37:51 PM By: Alric Quan Entered By: Alric Quan on 09/16/2015 09:25:43

## 2015-09-21 ENCOUNTER — Ambulatory Visit
Admission: RE | Admit: 2015-09-21 | Discharge: 2015-09-21 | Disposition: A | Discharge status: Home / Self Care | Payer: Medicare Other | Source: Ambulatory Visit | Attending: Surgery | Admitting: Surgery

## 2015-09-21 DIAGNOSIS — T148XXA Other injury of unspecified body region, initial encounter: Secondary | ICD-10-CM

## 2015-09-21 DIAGNOSIS — S81802A Unspecified open wound, left lower leg, initial encounter: Secondary | ICD-10-CM | POA: Diagnosis not present

## 2015-09-21 DIAGNOSIS — S8012XA Contusion of left lower leg, initial encounter: Secondary | ICD-10-CM | POA: Insufficient documentation

## 2015-09-21 DIAGNOSIS — X58XXXA Exposure to other specified factors, initial encounter: Secondary | ICD-10-CM | POA: Diagnosis not present

## 2015-09-23 ENCOUNTER — Encounter: Payer: Medicare Other | Attending: Surgery | Admitting: Surgery

## 2015-09-23 DIAGNOSIS — F329 Major depressive disorder, single episode, unspecified: Secondary | ICD-10-CM | POA: Diagnosis not present

## 2015-09-23 DIAGNOSIS — M70861 Other soft tissue disorders related to use, overuse and pressure, right lower leg: Secondary | ICD-10-CM | POA: Insufficient documentation

## 2015-09-23 DIAGNOSIS — K219 Gastro-esophageal reflux disease without esophagitis: Secondary | ICD-10-CM | POA: Diagnosis not present

## 2015-09-23 DIAGNOSIS — I2510 Atherosclerotic heart disease of native coronary artery without angina pectoris: Secondary | ICD-10-CM | POA: Diagnosis not present

## 2015-09-23 DIAGNOSIS — M1990 Unspecified osteoarthritis, unspecified site: Secondary | ICD-10-CM | POA: Diagnosis not present

## 2015-09-23 DIAGNOSIS — L97213 Non-pressure chronic ulcer of right calf with necrosis of muscle: Secondary | ICD-10-CM | POA: Diagnosis not present

## 2015-09-23 DIAGNOSIS — G4730 Sleep apnea, unspecified: Secondary | ICD-10-CM | POA: Insufficient documentation

## 2015-09-23 DIAGNOSIS — Z87891 Personal history of nicotine dependence: Secondary | ICD-10-CM | POA: Diagnosis not present

## 2015-09-23 DIAGNOSIS — E11622 Type 2 diabetes mellitus with other skin ulcer: Secondary | ICD-10-CM | POA: Diagnosis not present

## 2015-09-23 DIAGNOSIS — N400 Enlarged prostate without lower urinary tract symptoms: Secondary | ICD-10-CM | POA: Diagnosis not present

## 2015-09-23 DIAGNOSIS — I10 Essential (primary) hypertension: Secondary | ICD-10-CM | POA: Diagnosis not present

## 2015-09-23 DIAGNOSIS — S81801A Unspecified open wound, right lower leg, initial encounter: Secondary | ICD-10-CM | POA: Diagnosis not present

## 2015-09-23 NOTE — Progress Notes (Addendum)
LYLE, WEYER ([id number]) Visit Report for 09/23/2015 Chief Complaint Document Details Patient Name: Seth Mcintosh, Seth Mcintosh Date of Service: 09/23/2015 2:15 PM Medical Record Number: [id number] Patient Account Number: [id number] Date of Birth/Sex: 1940/04/26 (75 y.o. Male) Treating RN: Ahmed Prima Primary Care Physician: [facility], [place] Der Other Clinician: Referring Physician: Keith Rake Treating Physician/Extender: Frann Rider in Treatment: 1 Information Obtained from: Patient Chief Complaint Patient seen for complaints of Non-Healing Wound which she's had on the right lower extremity for about 5 weeks Electronic Signature(s) Signed: 09/23/2015 2:46:55 PM By: Christin Fudge MD, FACS Entered By: Christin Fudge on 09/23/2015 14:46:55 Seth Mcintosh ([id number]) -------------------------------------------------------------------------------- Debridement Details Patient Name: Seth Mcintosh Date of Service: 09/23/2015 2:15 PM Medical Record Number: [id number] Patient Account Number: [id number] Date of Birth/Sex: 1940-05-14 (75 y.o. Male) Treating RN: Ahmed Prima Primary Care Physician: [facility], [place] Der Other Clinician: Referring Physician: Keith Rake Treating Physician/Extender: Frann Rider in Treatment: 1 Debridement Performed for Wound #1 Right,Lateral Lower Leg Assessment: Performed By: Physician Christin Fudge, MD Debridement: Debridement Pre-procedure Yes Verification/Time Out Taken: Start Time: 14:28 Pain Control: Lidocaine 4% Topical Solution Level: Skin/Subcutaneous Tissue Total Area Debrided (L x 2.7 (cm) x 1.8 (cm) = 4.86 (cm) W): Tissue and other Viable, Non-Viable, Exudate, Fibrin/Slough, Subcutaneous material debrided: Instrument: Curette Bleeding: Minimum Hemostasis Achieved: Pressure End Time: 14:29 Procedural Pain: 0 Post Procedural Pain: 0 Response to Treatment: Procedure was tolerated well Post Debridement Measurements of Total Wound Length: (cm)  2.7 Width: (cm) 1.8 Depth: (cm) 0.7 Volume: (cm) 2.672 Post Procedure Diagnosis Same as Pre-procedure Electronic Signature(s) Signed: 09/23/2015 2:46:49 PM By: Christin Fudge MD, FACS Signed: 09/23/2015 5:18:04 PM By: Alric Quan Entered By: Christin Fudge on 09/23/2015 14:46:48 Seth Mcintosh ([id number]) -------------------------------------------------------------------------------- HPI Details Patient Name: Seth Mcintosh Date of Service: 09/23/2015 2:15 PM Medical Record Number: [id number] Patient Account Number: [id number] Date of Birth/Sex: Aug 28, 1940 (75 y.o. Male) Treating RN: Ahmed Prima Primary Care Physician: [facility], [place]ossie Der Other Clinician: Referring Physician: [facility] Treating Physician/Extender: Frann Rider in Treatment: 1 History of Present Illness Location: open wound right lower extremity lateral leg Quality: Patient reports experiencing a dull pain to affected area(s). Severity: Patient states wound are getting worse. Duration: Patient has had the wound for > 6 weeks prior to seeking treatment at the wound center Timing: Pain in wound is Intermittent (comes and goes Context: The wound occurred when the patient had a injury with a blunt object around 08/07/2015 Modifying Factors: a large swelling with drainage Associated Signs and Symptoms: Patient reports having increase swelling. HPI Description: 75 year old gentleman with known history of diabetes mellitus without complication had a lacerated wound to his right lower extremity which was repaired about 4 weeks ago. Was seen by his PCP Dr. Donneta Romberg with a open wound on the right lateral leg with some discharge and erythema and he was placed on a blocks and local dressing was applied and referred to [place] for further care. Past medical history significant for diabetes mellitus type 2, coronary artery disease, hypertension, arthritis, depression, foot surgery o2, back surgery, CABG, TURP, right total knee  replacement. He is a former smoker and quit in 1994. On questioning the patient he gives a history that he had a blunt injury to his right leg and this happened on April 15 of this year and about 2 weeks later he had blood draining from here and had to go to the ER and have sutures placed. However on looking at his medical records he was in the  ER on 05/17/2015 with a blunt injury on his right lower extremity and at that stage x-ray showed no fracture and the right lower leg ultrasound showed no evidence of DVT. Diagnosis of a hematoma was made and he was given symptomatic treatment for this. The next time he was seen in the emergency room was on 08/11/2015 when the patient had a 10 cm laceration to the right lateral leg and this was sutured by the ER physician Dr. Dahlia Client. His blood alcohol level was 162 mg percent that day and the sutures were to be removed in 10 days' time. 09/23/2015 -- had his ultrasound of the left lower extremity for possible hematoma and the impression was there was no discrete drainable hematoma in the area of clinical concern on the left lower extremity. On reviewing his history he clearly states that he never had a large lacerated wound due to the injury or fall on April 19. He says he did have a few drinks but has not drunk and the hematoma which he had for a couple of months just burst open and caused the big bleed and extrusion of clots. Electronic Signature(s) Signed: 09/23/2015 2:48:53 PM By: Christin Fudge MD, FACS Previous Signature: 09/23/2015 2:21:33 PM Version By: Christin Fudge MD, FACS Entered By: Christin Fudge on 09/23/2015 14:48:53 Seth Mcintosh ([id number]) -------------------------------------------------------------------------------- Physical Exam Details Patient Name: Seth Mcintosh Date of Service: 09/23/2015 2:15 PM Medical Record Number: [id number] Patient Account Number: [id number] Date of Birth/Sex: [birth year] (75 y.o. Male) Treating RN:  Ahmed Prima Primary Care Physician: [facility], [place] Der Other Clinician: Referring Physician: [facility], [facility] Treating Physician/Extender: Frann Rider in Treatment: 1 Constitutional . Pulse regular. Respirations normal and unlabored. Afebrile. . Eyes Nonicteric. Reactive to light. Ears, Nose, Mouth, and Throat Lips, teeth, and gums WNL.Marland Kitchen Moist mucosa without lesions. Neck supple and nontender. No palpable supraclavicular or cervical adenopathy. Normal sized without goiter. Respiratory WNL. No retractions.. Breath sounds WNL, No rubs, rales, rhonchi, or wheeze.. Cardiovascular Heart rhythm and rate regular, no murmur or gallop.. Pedal Pulses WNL. No clubbing, cyanosis or edema. Chest Breasts symmetical and no nipple discharge.. Breast tissue WNL, no masses, lumps, or tenderness.. Lymphatic No adneopathy. No adenopathy. No adenopathy. Musculoskeletal Adexa without tenderness or enlargement.. Digits and nails w/o clubbing, cyanosis, infection, petechiae, ischemia, or inflammatory conditions.. Integumentary (Hair, Skin) No suspicious lesions. No crepitus or fluctuance. No peri-wound warmth or erythema. No masses.Marland Kitchen Psychiatric Judgement and insight Intact.. No evidence of depression, anxiety, or agitation.. Notes the wound on the right lateral compartment continues to have undermining anteriorly but there is no evidence of clots or seroma draining. Electronic Signature(s) Signed: 09/23/2015 2:49:28 PM By: Christin Fudge MD, FACS Entered By: Christin Fudge on 09/23/2015 14:49:27 Seth Mcintosh ([id number]) -------------------------------------------------------------------------------- Physician Orders Details Patient Name: Seth Mcintosh Date of Service: 09/23/2015 2:15 PM Medical Record Number: [id number] Patient Account Number: [id number] Date of Birth/Sex: May 20, 1940 (75 y.o. Male) Treating RN: Ahmed Prima Primary Care Physician: [facility], [place] Der Other Clinician: Referring  Physician: [facility], [place] Der Treating Physician/Extender: Frann Rider in Treatment: 1 Verbal / Phone Orders: Yes ClinicianCarolyne Fiscal, Debi Read Back and Verified: Yes Diagnosis Coding Wound Cleansing Wound #1 Right,Lateral Lower Leg o Cleanse wound with mild soap and water o May Shower, gently pat wound dry prior to applying new dressing. Anesthetic Wound #1 Right,Lateral Lower Leg o Topical Lidocaine 4% cream applied to wound bed prior to debridement Skin Barriers/Peri-Wound Care Wound #1 Right,Lateral Lower Leg o Skin Prep Primary Wound Dressing Wound #1  Right,Lateral Lower Leg o Aquacel Ag - rope Secondary Dressing Wound #1 Right,Lateral Lower Leg o Boardered Foam Dressing Dressing Change Frequency Wound #1 Right,Lateral Lower Leg o Change dressing every other day. - unless it is draining copious amounts Edema Control Wound #1 Right,Lateral Lower Leg o Elevate legs to the level of the heart and pump ankles as often as possible Additional Orders / Instructions Wound #1 Right,Lateral Lower Leg o Increase protein intake. Seth Mcintosh, Seth Mcintosh ([id number]) Electronic Signature(s) Signed: 09/23/2015 4:11:18 PM By: Christin Fudge MD, FACS Signed: 09/23/2015 5:18:04 PM By: Alric Quan Entered By: Alric Quan on 09/23/2015 14:36:28 Seth Mcintosh ([id number]) -------------------------------------------------------------------------------- Problem List Details Patient Name: Seth Mcintosh Date of Service: 09/23/2015 2:15 PM Medical Record Number: [id number] Patient Account Number: [id number] Date of Birth/Sex: 1940/12/16 (75 y.o. 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 Male) Treating RN: Ahmed Prima Primary Care Physician: [facility], [place] Der Other Clinician: Referring Physician: [facility], [facility] Treating Physician/Extender: Frann Rider in Treatment: 1 Subjective Chief Complaint Information obtained from Patient Patient seen for complaints of Non-Healing Wound which she's had on the right lower extremity for about 5 weeks History of Present Illness (HPI) The following HPI elements were documented for the patient's wound: Location: open wound right lower extremity lateral leg Quality: Patient reports experiencing a dull pain to affected area(s). Severity: Patient states wound are getting worse. Duration: Patient has had the wound for > 6 weeks prior to seeking treatment at the wound center Timing: Pain in wound is Intermittent (comes and goes Context: The wound occurred when the patient had a injury with a blunt object around 08/07/2015 Modifying Factors: a large swelling with drainage Associated Signs and Symptoms: Patient reports having increase swelling. 75 year old gentleman with known history of diabetes mellitus without complication had a lacerated wound to his right lower extremity which was repaired about 4 weeks ago. Was seen by his PCP Dr. Donneta Romberg with a open wound on the right lateral  leg with some discharge and erythema and he was placed on a blocks and local dressing was applied and referred to [place] for further care. Past medical history significant for diabetes mellitus type 2, coronary artery disease, hypertension, arthritis, depression, foot surgery o2, back surgery, CABG, TURP, right total knee replacement. He is a former smoker and quit in 1994. On questioning the patient he gives a history that he had a blunt injury to his right leg and this happened on April 15 of this year and about 2 weeks later he had blood draining from here and had to go to the ER and have sutures placed. However on looking at his medical records he was in the ER on 05/17/2015 with a blunt injury on his right lower extremity and at that stage x-ray showed no fracture and the  right lower leg ultrasound showed no evidence of DVT. Diagnosis of a hematoma was made and he was given symptomatic treatment for this. The next time he was seen in the emergency room was on 08/11/2015 when the patient had a 10 cm laceration to the right lateral leg and this was sutured by the ER physician Dr. Dahlia Client. His blood alcohol level was 162 mg percent that day and the sutures were to be removed in 10 days' time. 09/23/2015 -- had his ultrasound of the left lower extremity for possible hematoma and the impression was there was no discrete drainable hematoma in the area of clinical concern on the left lower extremity. On reviewing his history he clearly states that he never had a large lacerated wound due to the injury or fall on April 19. He says he did have a few drinks but has not drunk and the hematoma which he had for a Seth Mcintosh, Seth Mcintosh. ([id number]) couple of months just burst open and caused the big bleed and extrusion of clots. Objective Constitutional Pulse regular. Respirations normal and unlabored. Afebrile. Vitals Time Taken: 2:06 PM, Height: 74 in, Weight: 265 lbs, BMI: 34, Temperature: 97.7 F, Pulse:  63 bpm, Respiratory Rate: 18 breaths/min, Blood Pressure: 130/68 mmHg. Eyes Nonicteric. Reactive to light. Ears, Nose, Mouth, and Throat Lips, teeth, and gums WNL.Marland Kitchen Moist mucosa without lesions. Neck supple and nontender. No palpable supraclavicular or cervical adenopathy. Normal sized without goiter. Respiratory WNL. No retractions.. Breath sounds WNL, No rubs, rales, rhonchi, or wheeze.. Cardiovascular Heart rhythm and rate regular, no murmur or gallop.. Pedal Pulses WNL. No clubbing, cyanosis or edema. Chest Breasts symmetical and no nipple discharge.. Breast tissue WNL, no masses, lumps, or tenderness.. Lymphatic No adneopathy. No adenopathy. No adenopathy. Musculoskeletal Adexa without tenderness or enlargement.. Digits and nails w/o clubbing, cyanosis, infection, petechiae, ischemia, or inflammatory conditions.Marland Kitchen Psychiatric Judgement and insight Intact.. No evidence of depression, anxiety, or agitation.. General Notes: the wound on the right lateral compartment continues to have undermining anteriorly but there is no evidence of clots or seroma draining. Integumentary (Hair, Skin) No suspicious lesions. No crepitus or fluctuance. No peri-wound warmth or erythema. No masses.Marland Kitchen Seth Mcintosh, Seth Mcintosh ([id number]) Wound #1 status is Open. Original cause of wound was Trauma. The wound is located on the Right,Lateral Lower Leg. The wound measures 2.7cm length x 1.8cm width x 0.7cm depth; 3.817cm^2 area and 2.672cm^3 volume. The wound is limited to skin breakdown. There is no tunneling noted, however, there is undermining starting at 12:00 and ending at 6:00 with a maximum distance of 4cm. There is a large amount of serosanguineous drainage noted. The wound margin is thickened. There is small (1-33%) granulation within the wound bed. There is a large (67-100%) amount of necrotic tissue within the wound bed including Adherent Slough. The periwound skin appearance exhibited: Localized Edema,  Moist. Periwound temperature was noted as No Abnormality. The periwound has tenderness on palpation. 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 We will continue with light packing of silver alginate and continue local care which he will do over the next 2 weeks as he is going to be out of town. I  have discussed signs and symptoms of inflammation or of sudden profuse bleeding and he is to report to the ER immediately if this happens. Will return on his vacation getting over. Procedures Wound #1 Wound #1 is a Trauma, Other located on the Right,Lateral Lower Leg . There was a Skin/Subcutaneous Tissue Debridement [id number]) debridement with total area of 4.86 sq cm performed by Christin Fudge, MD. with the following instrument(s): Curette to remove Viable and Non-Viable tissue/material including Exudate, Fibrin/Slough, and Subcutaneous after achieving pain control using Lidocaine 4% Topical Solution. A time out was conducted prior to the start of the procedure. A Minimum amount of bleeding was controlled with Pressure. The procedure was tolerated well with a pain level of 0 throughout and a pain level of 0 following the procedure. Post Debridement Measurements: 2.7cm length x 1.8cm width x 0.7cm depth; 2.672cm^3 volume. Seth Mcintosh, Seth Mcintosh ([id number]) Post procedure Diagnosis Wound #1: Same as Pre-Procedure Plan Wound Cleansing: Wound #1 Right,Lateral Lower Leg: Cleanse wound with mild soap and water May Shower, gently pat wound dry prior to applying new dressing. Anesthetic: Wound #1 Right,Lateral Lower Leg: Topical Lidocaine 4% cream  applied to wound bed prior to debridement Skin Barriers/Peri-Wound Care: Wound #1 Right,Lateral Lower Leg: Skin Prep Primary Wound Dressing: Wound #1 Right,Lateral Lower Leg: Aquacel Ag - rope Secondary Dressing: Wound #1 Right,Lateral Lower Leg: Boardered Foam Dressing Dressing Change Frequency: Wound #1 Right,Lateral Lower Leg: Change dressing every other day. - unless it is draining copious amounts Edema Control: Wound #1 Right,Lateral Lower Leg: Elevate legs to the level of the heart and pump ankles as often as possible Additional Orders / Instructions: Wound #1 Right,Lateral Lower Leg: Increase protein intake. I have cleant the wound nicely with a curette and forceps and irrigated it and made sure that there are no pockets with seroma or clots. We will continue with light packing of silver alginate and continue local care which he will do over the next 2 weeks as he is going to be out of town. I have discussed signs and symptoms of inflammation or of sudden profuse bleeding and he is to report to the ER immediately if this happens. Seth Mcintosh, Seth Mcintosh ([id number]) Will return on his vacation getting over. Electronic Signature(s) Signed: 09/23/2015 2:53:18 PM By: Christin Fudge MD, FACS Entered By: Christin Fudge on 09/23/2015 14:53:17 Seth Mcintosh ([id number]) -------------------------------------------------------------------------------- Ward Details Patient Name: Seth Mcintosh Date of Service: 09/23/2015 Medical Record Number: [id number] Patient Account Number: [id number] Date of Birth/Sex: Jun 23, 1940 (75 y.o. Male) Treating RN: Ahmed Prima Primary Care Physician: [facility], [place]ossie Der Other Clinician: Referring Physician: [facility], [place] Der Treating Physician/Extender: Frann Rider in Treatment: 1 Diagnosis Coding ICD-10 Codes Code Description E11.622 Type 2 diabetes mellitus with other skin ulcer L97.213 Non-pressure chronic ulcer of right calf with necrosis of  muscle M70.861 Other soft tissue disorders related to use, overuse and pressure, right lower leg Facility Procedures CPT4: Description Modifier Quantity Code [id number] - DEB SUBQ TISSUE 20 SQ CM/< 1 ICD-10 Description Diagnosis E11.622 Type 2 diabetes mellitus with other skin ulcer L97.213 Non-pressure chronic ulcer of right calf with necrosis of muscle M70.861  Other soft tissue disorders related to use, overuse and pressure, right lower leg Physician Procedures CPT4: Description Modifier Quantity Code [id number] - WC PHYS SUBQ TISS 20 SQ CM 1 ICD-10 Description Diagnosis E11.622 Type 2 diabetes mellitus with other skin ulcer L97.213 Non-pressure chronic ulcer of right calf with necrosis of muscle M70.861  Other soft tissue disorders related to use,  overuse and pressure, right lower leg Electronic Signature(s) Signed: 09/23/2015 2:53:31 PM By: Christin Fudge MD, FACS Entered By: Christin Fudge on 09/23/2015 14:53:31

## 2015-09-24 NOTE — Progress Notes (Signed)
Seth Mcintosh, Seth Mcintosh ([id number]) Visit Report for 09/23/2015 Arrival Information Details Patient Name: Seth Mcintosh, Seth Mcintosh Date of Service: 09/23/2015 2:15 PM Medical Record Number: [id number] Patient Account Number: [id number] Date of Birth/Sex: [birth year] (75 y.o. Male) Treating RN: Ahmed Prima Primary Care Physician: [facility], Dossie Der Other Clinician: Referring Physician: [facility], Dossie Der Treating Physician/Extender: Frann Rider in Treatment: 1 Visit Information History Since Last Visit All ordered tests and consults were completed: No Patient Arrived: Ambulatory Added or deleted any medications: No Arrival Time: 14:03 Any new allergies or adverse reactions: No Accompanied By: self Had a fall or experienced change in No Transfer Assistance: None activities of daily living that may affect Patient Identification Verified: Yes risk of falls: Secondary Verification Process Yes Signs or symptoms of abuse/neglect since last No Completed: visito Patient Requires Transmission-Based No Hospitalized since last visit: No Precautions: Pain Present Now: No Patient Has Alerts: Yes Patient Alerts: DM II Electronic Signature(s) Signed: 09/23/2015 5:18:04 PM By: Alric Quan Entered By: Alric Quan on 09/23/2015 14:06:23 Seth Mcintosh ([id number]) -------------------------------------------------------------------------------- Encounter Discharge Information Details Patient Name: Seth Mcintosh Date of Service: 09/23/2015 2:15 PM Medical Record Number: [id number] Patient Account Number: [id number] Date of Birth/Sex: 17-Nov-1940 (75 y.o. Male) Treating RN: Ahmed Prima Primary Care Physician: [facility], Dossie Der Other Clinician: Referring Physician: [facility], [facility] Treating Physician/Extender: Frann Rider in Treatment: 1 Encounter Discharge Information Items Discharge Pain Level: 0 Discharge Condition: Stable Ambulatory Status: Ambulatory Discharge Destination: Home Transportation:  Private Auto Accompanied By: self Schedule Follow-up Appointment: Yes Medication Reconciliation completed and provided to Patient/Care Yes Seth Mcintosh: Provided on Clinical Summary of Care: 09/23/2015 Form Type Recipient Paper Patient JK Electronic Signature(s) Signed: 09/23/2015 2:40:42 PM By: Ruthine Dose Entered By: Ruthine Dose on 09/23/2015 14:40:42 Seth Mcintosh ([id number]) -------------------------------------------------------------------------------- Lower Extremity Assessment Details Patient Name: Seth Mcintosh Date of Service: 09/23/2015 2:15 PM Medical Record Number: [id number] Patient Account Number: [id number] Date of Birth/Sex: Mar 16, 1941 (75 y.o. Male) Treating RN: Ahmed Prima Primary Care Physician: [facility], Dossie Der Other Clinician: Referring Physician: [facility], [facility] Treating Physician/Extender: Frann Rider in Treatment: 1 Vascular Assessment Pulses: Posterior Tibial Dorsalis Pedis Palpable: [Right:Yes] Extremity colors, hair growth, and conditions: Extremity Color: [Right:Normal] Temperature of Extremity: [Right:Warm] Capillary Refill: [Right:< 3 seconds] Toe Nail Assessment Left: Right: Thick: No Discolored: No Deformed: No Improper Length and Hygiene: No Electronic Signature(s) Signed: 09/23/2015 5:18:04 PM By: Alric Quan Entered By: Alric Quan on 09/23/2015 14:10:30 Seth Mcintosh ([id number]) -------------------------------------------------------------------------------- Multi Wound Chart Details Patient Name: Seth Mcintosh Date of Service: 09/23/2015 2:15 PM Medical Record Number: [id number] Patient Account Number: [id number] Date of Birth/Sex: [birth year] (75 y.o. Male) Treating RN: Ahmed Prima Primary Care Physician: [facility], Dossie Der Other Clinician: Referring Physician: [facility], [facility] Treating Physician/Extender: Frann Rider in Treatment: 1 Vital Signs Height(in): 74 Pulse(bpm): 63 Weight(lbs): 265 Blood  Pressure 130/68 (mmHg): Body Mass Index(BMI): 34 Temperature(F): 97.7 Respiratory Rate 18 (breaths/min): Photos: [1:No Photos] [N/A:N/A] Wound Location: [1:Right Lower Leg - Lateral] [N/A:N/A] Wounding Event: [1:Trauma] [N/A:N/A] Primary Etiology: [1:Trauma, Other] [N/A:N/A] Comorbid History: [1:Glaucoma, Sleep Apnea, Coronary Artery Disease, Hypertension, Type II Diabetes, Osteoarthritis] [N/A:N/A] Date Acquired: [1:08/07/2015] [N/A:N/A] Weeks of Treatment: [1:1] [N/A:N/A] Wound Status: [1:Open] [N/A:N/A] Measurements L x W x D 2.7x1.8x0.7 [N/A:N/A] (cm) Area (cm) : [1:3.817] [N/A:N/A] Volume (cm) : [1:2.672] [N/A:N/A] % Reduction in Area: [1:2.80%] [N/A:N/A] % Reduction in Volume: 2.80% [N/A:N/A] Starting Position 1 12 (o'clock): Ending Position 1 [1:6] (o'clock): Maximum Distance 1 4 (cm): Undermining: [1:Yes] [N/A:N/A] Classification: [1:Full Thickness With Exposed Support Structures] [N/A:N/A]  HBO Classification: [1:Grade 1] [N/A:N/A] Exudate Amount: [1:Large] [N/A:N/A] Exudate Type: [1:Serosanguineous] [N/A:N/A] Exudate Color: red, brown N/A N/A Wound Margin: Thickened N/A N/A Granulation Amount: Small (1-33%) N/A N/A Necrotic Amount: Large (67-100%) N/A N/A Exposed Structures: Fascia: No N/A N/A Fat: No Tendon: No Muscle: No Joint: No Bone: No Limited to Skin Breakdown Epithelialization: None N/A N/A Periwound Skin Texture: Edema: Yes N/A N/A Periwound Skin Moist: Yes N/A N/A Moisture: Periwound Skin Color: No Abnormalities Noted N/A N/A Temperature: No Abnormality N/A N/A Tenderness on Yes N/A N/A Palpation: Wound Preparation: Ulcer Cleansing: N/A N/A Rinsed/Irrigated with Saline Topical Anesthetic Applied: Other: lidocaine 4% Treatment Notes Electronic Signature(s) Signed: 09/23/2015 5:18:04 PM By: Alric Quan Entered By: Alric Quan on 09/23/2015 14:16:13 Seth Mcintosh  ([id number]) -------------------------------------------------------------------------------- Multi-Disciplinary Care Plan Details Patient Name: Seth Mcintosh Date of Service: 09/23/2015 2:15 PM Medical Record Number: [id number] Patient Account Number: [id number] Date of Birth/Sex: 01-26-1941 (75 y.o. Male) Treating RN: Ahmed Prima Primary Care Physician: [facility], Dossie Der Other Clinician: Referring Physician: [facility], Dossie Der Treating Physician/Extender: Frann Rider in Treatment: 1 Active Inactive Abuse / Safety / Falls / Self Care Management Nursing Diagnoses: Potential for falls Goals: Patient will remain injury free Date Initiated: 09/16/2015 Goal Status: Active Interventions: Assess fall risk on admission and as needed Notes: Orientation to the Wound Care Program Nursing Diagnoses: Knowledge deficit related to the wound healing center program Goals: Patient/caregiver will verbalize understanding of the [place] [facility] Date Initiated: 09/16/2015 Goal Status: Active Interventions: Provide education on orientation to the wound center Notes: Pain, Acute or Chronic Nursing Diagnoses: Pain, acute or chronic: actual or potential Potential alteration in comfort, pain Goals: Seth Mcintosh, Seth Mcintosh ([id number]) Patient will verbalize adequate pain control and receive pain control interventions during procedures as needed Date Initiated: 09/16/2015 Goal Status: Active Interventions: Assess comfort goal upon admission Complete pain assessment as per visit requirements Notes: Soft Tissue Infection Nursing Diagnoses: Impaired tissue integrity Goals: Patient will remain free of wound infection Date Initiated: 09/16/2015 Goal Status: Active Interventions: Assess signs and symptoms of infection every visit Notes: Wound/Skin Impairment Nursing Diagnoses: Impaired tissue integrity Goals: Ulcer/skin breakdown will have a volume reduction of 30% by week 4 Date  Initiated: 09/16/2015 Goal Status: Active Ulcer/skin breakdown will have a volume reduction of 50% by week 8 Date Initiated: 09/16/2015 Goal Status: Active Ulcer/skin breakdown will have a volume reduction of 80% by week 12 Date Initiated: 09/16/2015 Goal Status: Active Interventions: Assess patient/caregiver ability to obtain necessary supplies Assess ulceration(s) every visit Notes: Seth Mcintosh, Seth Mcintosh ([id number]) Electronic Signature(s) Signed: 09/23/2015 5:18:04 PM By: Alric Quan Entered By: Alric Quan on 09/23/2015 14:16:05 Seth Mcintosh ([id number]) -------------------------------------------------------------------------------- Pain Assessment Details Patient Name: Seth Mcintosh Date of Service: 09/23/2015 2:15 PM Medical Record Number: [id number] Patient Account Number: [id number] Date of Birth/Sex: 05/15/1940 (75 y.o. Male) Treating RN: Ahmed Prima Primary Care Physician: [facility], Dossie Der Other Clinician: Referring Physician: [facility], Dossie Der Treating Physician/Extender: Frann Rider in Treatment: 1 Active Problems Location of Pain Severity and Description of Pain Patient Has Paino No Site Locations Pain Management and Medication Current Pain Management: Electronic Signature(s) Signed: 09/23/2015 5:18:04 PM By: Alric Quan Entered By: Alric Quan on 09/23/2015 14:06:29 Seth Mcintosh ([id number]) -------------------------------------------------------------------------------- Patient/Caregiver Education Details Patient Name: Seth Mcintosh Date of Service: 09/23/2015 2:15 PM Medical Record Number: [id number] Patient Account Number: [id number] Date of Birth/Gender: 07-12-1940 (75 y.o. Male) Treating RN: Ahmed Prima Primary Care Physician: [facility], Dossie Der Other Clinician: Referring Physician: [facility], Dossie Der Treating Physician/Extender:  Britto, Quincy Simmonds in Treatment: 1 Education Assessment Education Provided To: Patient Education Topics  Provided Wound/Skin Impairment: Handouts: Other: change dressing as ordered Electronic Signature(s) Signed: 09/23/2015 5:18:04 PM By: Alric Quan Entered By: Alric Quan on 09/23/2015 14:18:13 Seth Mcintosh ([id number]) -------------------------------------------------------------------------------- Wound Assessment Details Patient Name: Seth Mcintosh Date of Service: 09/23/2015 2:15 PM Medical Record Number: [id number] Patient Account Number: [id number] Date of Birth/Sex: 24-Feb-1941 (75 y.o. Male) Treating RN: Ahmed Prima Primary Care Physician: [facility], Dossie Der Other Clinician: Referring Physician: [facility], [facility] Treating Physician/Extender: Frann Rider in Treatment: 1 Wound Status Wound Number: 1 Primary Trauma, Other Etiology: Wound Location: Right Lower Leg - Lateral Wound Open Wounding Event: Trauma Status: Date Acquired: 08/07/2015 Comorbid Glaucoma, Sleep Apnea, Coronary Weeks Of Treatment: 1 History: Artery Disease, Hypertension, Type II Clustered Wound: No Diabetes, Osteoarthritis Photos Photo Uploaded By: Alric Quan on 09/23/2015 17:20:20 Wound Measurements Length: (cm) 2.7 % Reduction in Width: (cm) 1.8 % Reduction in Depth: (cm) 0.7 Epithelializat Area: (cm) 3.817 Tunneling: Volume: (cm) 2.672 Undermining: Starting Po Ending Posi Maximum Dis Area: 2.8% Volume: 2.8% ion: None No Yes sition (o'clock): 12 tion (o'clock): 6 tance: (cm) 4 Wound Description Full Thickness With Exposed Classification: Support Structures Diabetic Severity Grade 1 (Wagner): Wound Margin: Thickened Exudate Amount: Large Exudate Type: Serosanguineous Seth Mcintosh, Seth Mcintosh ([id number]) Foul Odor After Cleansing: No Exudate Color: red, brown Wound Bed Granulation Amount: Small (1-33%) Exposed Structure Necrotic Amount: Large (67-100%) Fascia Exposed: No Necrotic Quality: Adherent Slough Fat Layer Exposed: No Tendon Exposed: No Muscle Exposed:  No Joint Exposed: No Bone Exposed: No Limited to Skin Breakdown Periwound Skin Texture Texture Color No Abnormalities Noted: No No Abnormalities Noted: No Localized Edema: Yes Temperature / Pain Moisture Temperature: No Abnormality No Abnormalities Noted: No Tenderness on Palpation: Yes Moist: Yes Wound Preparation Ulcer Cleansing: Rinsed/Irrigated with Saline Topical Anesthetic Applied: Other: lidocaine 4%, Treatment Notes Wound #1 (Right, Lateral Lower Leg) 1. Cleansed with: Clean wound with Normal Saline 2. Anesthetic Topical Lidocaine 4% cream to wound bed prior to debridement 3. Peri-wound Care: Skin Prep 4. Dressing Applied: Aquacel Ag 5. Secondary Dressing Applied Bordered Foam Dressing Electronic Signature(s) Signed: 09/23/2015 5:18:04 PM By: Alric Quan Entered By: Alric Quan on 09/23/2015 14:15:25 Seth Mcintosh ([id number]) -------------------------------------------------------------------------------- [place] Details Patient Name: Seth Mcintosh Date of Service: 09/23/2015 2:15 PM Medical Record Number: [id number] Patient Account Number: [id number] Date of Birth/Sex: Sep 16, 1940 (75 y.o. Male) Treating RN: Ahmed Prima Primary Care Physician: [facility], [place] Other Clinician: Referring Physician: [facility], [facility] Treating Physician/Extender: Frann Rider in Treatment: 1 Vital Signs Time Taken: 14:06 Temperature (F): 97.7 Height (in): 74 Pulse (bpm): 63 Weight (lbs): 265 Respiratory Rate (breaths/min): 18 Body Mass Index (BMI): 34 Blood Pressure (mmHg): 130/68 Reference Range: 80 - 120 mg / dl Electronic Signature(s) Signed: 09/23/2015 5:18:04 PM By: Alric Quan Entered By: Alric Quan on 09/23/2015 14:08:26

## 2015-10-04 ENCOUNTER — Other Ambulatory Visit: Payer: Self-pay

## 2015-10-04 ENCOUNTER — Encounter: Payer: Medicare Other | Admitting: Surgery

## 2015-10-04 DIAGNOSIS — J301 Allergic rhinitis due to pollen: Secondary | ICD-10-CM | POA: Diagnosis not present

## 2015-10-04 DIAGNOSIS — L97811 Non-pressure chronic ulcer of other part of right lower leg limited to breakdown of skin: Secondary | ICD-10-CM | POA: Diagnosis not present

## 2015-10-04 DIAGNOSIS — F5104 Psychophysiologic insomnia: Secondary | ICD-10-CM

## 2015-10-04 DIAGNOSIS — M70861 Other soft tissue disorders related to use, overuse and pressure, right lower leg: Secondary | ICD-10-CM | POA: Diagnosis not present

## 2015-10-04 DIAGNOSIS — I2510 Atherosclerotic heart disease of native coronary artery without angina pectoris: Secondary | ICD-10-CM | POA: Diagnosis not present

## 2015-10-04 DIAGNOSIS — I10 Essential (primary) hypertension: Secondary | ICD-10-CM | POA: Diagnosis not present

## 2015-10-04 DIAGNOSIS — Z87891 Personal history of nicotine dependence: Secondary | ICD-10-CM | POA: Diagnosis not present

## 2015-10-04 DIAGNOSIS — G4733 Obstructive sleep apnea (adult) (pediatric): Secondary | ICD-10-CM | POA: Diagnosis not present

## 2015-10-04 DIAGNOSIS — H93299 Other abnormal auditory perceptions, unspecified ear: Secondary | ICD-10-CM | POA: Diagnosis not present

## 2015-10-04 DIAGNOSIS — E669 Obesity, unspecified: Secondary | ICD-10-CM | POA: Diagnosis not present

## 2015-10-04 DIAGNOSIS — L97213 Non-pressure chronic ulcer of right calf with necrosis of muscle: Secondary | ICD-10-CM | POA: Diagnosis not present

## 2015-10-04 DIAGNOSIS — E11622 Type 2 diabetes mellitus with other skin ulcer: Secondary | ICD-10-CM | POA: Diagnosis not present

## 2015-10-04 MED ORDER — SUVOREXANT 10 MG PO TABS: 1.0000 | ORAL_TABLET | Freq: Every evening | ORAL | Status: DC | PRN

## 2015-10-04 NOTE — Telephone Encounter (Signed)
Pt states that we have faxed this in for him before and is asking for this to be done ASAP.Pt is out.

## 2015-10-04 NOTE — Telephone Encounter (Signed)
We will print the prescription for Belsomra and it can be faxed to [facility].

## 2015-10-04 NOTE — Addendum Note (Signed)
Addended byManuella Ghazi, Orphia Mctigue A A on: 10/04/2015 10:56 AM   Modules accepted: Orders

## 2015-10-05 ENCOUNTER — Telehealth: Payer: Self-pay

## 2015-10-05 MED ORDER — FLUTICASONE PROPIONATE 50 MCG/ACT NA SUSP: 2.0000 | Freq: Every day | NASAL | Status: AC

## 2015-10-05 NOTE — Progress Notes (Signed)
Seth Mcintosh, Seth Mcintosh ([id number]) Visit Report for 10/04/2015 Arrival Information Details Patient Name: Seth Mcintosh, Seth Mcintosh Date of Service: 10/04/2015 8:45 AM Medical Record Number: [id number] Patient Account Number: [id number] Date of Birth/Sex: [birth year] Mcintosh (75 y.o. Male) Treating RN: Ahmed Prima Primary Care Physician: [facility], Dossie Der Other Clinician: Referring Physician: [facility], Dossie Der Treating Physician/Extender: Frann Rider in Treatment: 2 Visit Information History Since Last Visit All ordered tests and consults were completed: No Patient Arrived: Ambulatory Added or deleted any medications: No Arrival Time: 08:49 Any new allergies or adverse reactions: No Accompanied By: self Had a fall or experienced change in No Transfer Assistance: None activities of daily living that may affect Patient Identification Verified: Yes risk of falls: Secondary Verification Process Yes Signs or symptoms of abuse/neglect since last No Completed: visito Patient Requires Transmission-Based No Hospitalized since last visit: No Precautions: Pain Present Now: No Patient Has Alerts: Yes Patient Alerts: DM II Electronic Signature(s) Signed: 10/04/2015 5:25:44 PM By: Alric Quan Entered By: Alric Quan on 10/04/2015 08:50:19 Seth Mcintosh ([id number]) -------------------------------------------------------------------------------- Encounter Discharge Information Details Patient Name: Seth Mcintosh Date of Service: 10/04/2015 8:45 AM Medical Record Number: [id number] Patient Account Number: [id number] Date of Birth/Sex: [birth year] Seth Mcintosh (75 y.o. Male) Treating RN: Ahmed Prima Primary Care Physician: [facility], Dossie Der Other Clinician: Referring Physician: [facility], [facility] Treating Physician/Extender: Frann Rider in Treatment: 2 Encounter Discharge Information Items Discharge Pain Level: 0 Discharge Condition: Stable Ambulatory Status: Ambulatory Discharge Destination:  Home Transportation: Private Auto Accompanied By: self Schedule Follow-up Appointment: Yes Medication Reconciliation completed and provided to Patient/Care Yes Kimbra Marcelino: Provided on Clinical Summary of Care: 10/04/2015 Form Type Recipient Paper Patient JK Electronic Signature(s) Signed: 10/04/2015 9:47:46 AM By: Ruthine Dose Entered By: Ruthine Dose on 10/04/2015 09:47:46 Seth Mcintosh ([id number]) -------------------------------------------------------------------------------- Lower Extremity Assessment Details Patient Name: Seth Mcintosh Date of Service: 10/04/2015 8:45 AM Medical Record Number: [id number] Patient Account Number: [id number] Date of Birth/Sex: [birth year]eth Mcintosh (75 y.o. Male) Treating RN: Ahmed Prima Primary Care Physician: [facility], Dossie Der Other Clinician: Referring Physician: [facility], [facility] Treating Physician/Extender: Frann Rider in Treatment: 2 Edema Assessment Assessed: [Left: No] [Right: No] Edema: [Left: Ye] [Right: s] Vascular Assessment Pulses: Posterior Tibial Dorsalis Pedis Palpable: [Right:Yes] Extremity colors, hair growth, and conditions: Extremity Color: [Right:Normal] Temperature of Extremity: [Right:Warm] Capillary Refill: [Right:< 3 seconds] Toe Nail Assessment Left: Right: Thick: No Discolored: No Deformed: No Improper Length and Hygiene: No Electronic Signature(s) Signed: 10/04/2015 5:25:44 PM By: Alric Quan Entered By: Alric Quan on 10/04/2015 09:42:10 Seth Mcintosh ([id number]) -------------------------------------------------------------------------------- Multi Wound Chart Details Patient Name: Seth Mcintosh Date of Service: 10/04/2015 8:45 AM Medical Record Number: [id number] Patient Account Number: [id number] Date of Birth/Sex: [birth year] Seth Mcintosh (75 y.o. Male) Treating RN: Ahmed Prima Primary Care Physician: [facility], Dossie Der Other Clinician: Referring Physician: [facility], [facility] Treating Physician/Extender: Frann Rider in Treatment: 2 Vital Signs Height(in): 74 Pulse(bpm): 54 Weight(lbs): 265 Blood Pressure 153/76 (mmHg): Body Mass Index(BMI): 34 Temperature(F): 97.8 Respiratory Rate 18 (breaths/min): Photos: [1:No Photos] [N/A:N/A] Wound Location: [1:Right Lower Leg - Lateral] [N/A:N/A] Wounding Event: [1:Trauma] [N/A:N/A] Primary Etiology: [1:Trauma, Other] [N/A:N/A] Comorbid History: [1:Glaucoma, Sleep Apnea, Coronary Artery Disease, Hypertension, Type II Diabetes, Osteoarthritis] [N/A:N/A] Date Acquired: [1:08/07/2015] [N/A:N/A] Weeks of Treatment: [1:2] [N/A:N/A] Wound Status: [1:Open] [N/A:N/A] Measurements L x W x D 2.9x1.7x0.6 [N/A:N/A] (cm) Area (cm) : [1:3.872] [N/A:N/A] Volume (cm) : [1:2.323] [N/A:N/A] % Reduction in Area: [1:1.40%] [N/A:N/A] % Reduction in Volume: 15.50% [N/A:N/A] Starting Position 1 1 (o'clock): Ending Position 1 [1:6] (o'clock): Maximum Distance 1 3.3 (  cm): Undermining: [1:Yes] [N/A:N/A] Classification: [1:Full Thickness With Exposed Support Structures] [N/A:N/A] HBO Classification: [1:Grade 1] [N/A:N/A] Exudate Amount: [1:Large] [N/A:N/A] Exudate Type: [1:Serosanguineous] [N/A:N/A] Exudate Color: red, brown N/A N/A Wound Margin: Thickened N/A N/A Granulation Amount: Small (1-33%) N/A N/A Necrotic Amount: Large (67-100%) N/A N/A Exposed Structures: Fascia: No N/A N/A Fat: No Tendon: No Muscle: No Joint: No Bone: No Limited to Skin Breakdown Epithelialization: None N/A N/A Periwound Skin Texture: Edema: Yes N/A N/A Periwound Skin Moist: Yes N/A N/A Moisture: Periwound Skin Color: No Abnormalities Noted N/A N/A Temperature: No Abnormality N/A N/A Tenderness on Yes N/A N/A Palpation: Wound Preparation: Ulcer Cleansing: N/A N/A Rinsed/Irrigated with Saline Topical Anesthetic Applied: Other: lidocaine 4% Treatment Notes Electronic Signature(s) Signed: 10/04/2015 5:25:44 PM By: Alric Quan Entered By: Alric Quan on 10/04/2015 08:58:36 Seth Mcintosh ([id number]) -------------------------------------------------------------------------------- Multi-Disciplinary Care Plan Details Patient Name: Seth Mcintosh Date of Service: 10/04/2015 8:45 AM Medical Record Number: [id number] Patient Account Number: [id number] Date of Birth/Sex: Seth Mcintosh[birth year] (75 y.o. Male) Treating RN: Ahmed Prima Primary Care Physician: [facility], Dossie Der Other Clinician: Referring Physician: [facility], Dossie Der Treating Physician/Extender: Frann Rider in Treatment: 2 Active Inactive Abuse / Safety / Falls / Self Care Management Nursing Diagnoses: Potential for falls Goals: Patient will remain injury free Date Initiated: 09/16/2015 Goal Status: Active Interventions: Assess fall risk on admission and as needed Notes: Orientation to the Wound Care Program Nursing Diagnoses: Knowledge deficit related to the wound healing center program Goals: Patient/caregiver will verbalize understanding of the [place] [facility] Date Initiated: 09/16/2015 Goal Status: Active Interventions: Provide education on orientation to the wound center Notes: Pain, Acute or Chronic Nursing Diagnoses: Pain, acute or chronic: actual or potential Potential alteration in comfort, pain Goals: Seth Mcintosh, Seth Mcintosh ([id number]) Patient will verbalize adequate pain control and receive pain control interventions during procedures as needed Date Initiated: 09/16/2015 Goal Status: Active Interventions: Assess comfort goal upon admission Complete pain assessment as per visit requirements Notes: Soft Tissue Infection Nursing Diagnoses: Impaired tissue integrity Goals: Patient will remain free of wound infection Date Initiated: 09/16/2015 Goal Status: Active Interventions: Assess signs and symptoms of infection every visit Notes: Wound/Skin Impairment Nursing Diagnoses: Impaired tissue integrity Goals: Ulcer/skin breakdown will have a  volume reduction of 30% by week 4 Date Initiated: 09/16/2015 Goal Status: Active Ulcer/skin breakdown will have a volume reduction of 50% by week 8 Date Initiated: 09/16/2015 Goal Status: Active Ulcer/skin breakdown will have a volume reduction of 80% by week 12 Date Initiated: 09/16/2015 Goal Status: Active Interventions: Assess patient/caregiver ability to obtain necessary supplies Assess ulceration(s) every visit Notes: Seth Mcintosh, Seth Mcintosh ([id number]) Electronic Signature(s) Signed: 10/04/2015 5:25:44 PM By: Alric Quan Entered By: Alric Quan on 10/04/2015 08:58:29 Seth Mcintosh ([id number]) -------------------------------------------------------------------------------- Pain Assessment Details Patient Name: Seth Mcintosh Date of Service: 10/04/2015 8:45 AM Medical Record Number: [id number] Patient Account Number: [id number] Date of Birth/Sex: Seth Mcintosh, Seth Mcintosh (75 y.o. Male) Treating RN: Ahmed Prima Primary Care Physician: [facility], Dossie Der Other Clinician: Referring Physician: [facility], Dossie Der Treating Physician/Extender: Frann Rider in Treatment: 2 Active Problems Location of Pain Severity and Description of Pain Patient Has Paino No Site Locations Pain Management and Medication Current Pain Management: Electronic Signature(s) Signed: 10/04/2015 5:25:44 PM By: Alric Quan Entered By: Alric Quan on 10/04/2015 08:50:25 Seth Mcintosh ([id number]) -------------------------------------------------------------------------------- Patient/Caregiver Education Details Patient Name: Seth Mcintosh Date of Service: 10/04/2015 8:45 AM Medical Record Number: [id number] Patient Account Number: [id number] Date of Birth/Gender: [birth year] (75 y.o. Male) Treating RN: Ahmed Prima Primary  Care Physician: [facility], [facility] Other Clinician: Referring Physician: [facility], [facility] Treating Physician/Extender: Frann Rider in Treatment: 2 Education Assessment Education  Provided To: Patient Education Topics Provided Wound/Skin Impairment: Handouts: Other: change dressing as ordered Methods: Demonstration, Explain/Verbal Responses: State content correctly Electronic Signature(s) Signed: 10/04/2015 5:25:44 PM By: Alric Quan Entered By: Alric Quan on 10/04/2015 09:40:39 Seth Mcintosh ([id number]) -------------------------------------------------------------------------------- Wound Assessment Details Patient Name: Seth Mcintosh Date of Service: 10/04/2015 8:45 AM Medical Record Number: [id number] Patient Account Number: [id number] Date of Birth/Sex: Seth Mcintosh-[birth year] (75 y.o. Male) Treating RN: Ahmed Prima Primary Care Physician: [facility], Dossie Der Other Clinician: Referring Physician: [facility], Dossie Der Treating Physician/Extender: Frann Rider in Treatment: 2 Wound Status Wound Number: 1 Primary Trauma, Other Etiology: Wound Location: Right Lower Leg - Lateral Wound Open Wounding Event: Trauma Status: Date Acquired: 08/07/2015 Comorbid Glaucoma, Sleep Apnea, Coronary Weeks Of Treatment: 2 History: Artery Disease, Hypertension, Type II Clustered Wound: No Diabetes, Osteoarthritis Photos Photo Uploaded By: Alric Quan on 10/04/2015 16:46:41 Wound Measurements Length: (cm) 2.9 % Reduction in Width: (cm) 1.7 % Reduction in Depth: (cm) 0.6 Epithelializat Area: (cm) 3.872 Tunneling: Volume: (cm) 2.323 Undermining: Starting Po Ending Posi Maximum Dis Area: 1.4% Volume: 15.5% ion: None No Yes sition (o'clock): 1 tion (o'clock): 6 tance: (cm) 3.3 Wound Description Full Thickness With Exposed Classification: Support Structures Diabetic Severity Grade 1 (Wagner): Wound Margin: Thickened Exudate Amount: Large Exudate Type: Serosanguineous Seth Mcintosh, Seth Mcintosh ([id number]) Foul Odor After Cleansing: No Exudate Color: red, brown Wound Bed Granulation Amount: Small (1-33%) Exposed Structure Necrotic Amount: Large  (67-100%) Fascia Exposed: No Necrotic Quality: Adherent Slough Fat Layer Exposed: No Tendon Exposed: No Muscle Exposed: No Joint Exposed: No Bone Exposed: No Limited to Skin Breakdown Periwound Skin Texture Texture Color No Abnormalities Noted: No No Abnormalities Noted: No Localized Edema: Yes Temperature / Pain Moisture Temperature: No Abnormality No Abnormalities Noted: No Tenderness on Palpation: Yes Moist: Yes Wound Preparation Ulcer Cleansing: Rinsed/Irrigated with Saline Topical Anesthetic Applied: Other: lidocaine 4%, Treatment Notes Wound #1 (Right, Lateral Lower Leg) 1. Cleansed with: Clean wound with Normal Saline 2. Anesthetic Topical Lidocaine 4% cream to wound bed prior to debridement 3. Peri-wound Care: Skin Prep 4. Dressing Applied: Aquacel Ag 5. Secondary Dressing Applied Bordered Foam Dressing Electronic Signature(s) Signed: 10/04/2015 5:25:44 PM By: Alric Quan Entered By: Alric Quan on 10/04/2015 08:57:08 Seth Mcintosh ([id number]) -------------------------------------------------------------------------------- Bond Details Patient Name: Seth Mcintosh Date of Service: 10/04/2015 8:45 AM Medical Record Number: [id number] Patient Account Number: [id number] Date of Birth/Sex: [birth year] Mcintosh (75 y.o. Male) Treating RN: Ahmed Prima Primary Care Physician: [facility], Dossie Der Other Clinician: Referring Physician: [facility], [facility] Treating Physician/Extender: Frann Rider in Treatment: 2 Vital Signs Time Taken: 08:50 Temperature (F): 97.8 Height (in): 74 Pulse (bpm): 54 Weight (lbs): 265 Respiratory Rate (breaths/min): 18 Body Mass Index (BMI): 34 Blood Pressure (mmHg): 153/76 Reference Range: 80 - 120 mg / dl Electronic Signature(s) Signed: 10/04/2015 5:25:44 PM By: Alric Quan Entered By: Alric Quan on 10/04/2015 08:52:24

## 2015-10-05 NOTE — Telephone Encounter (Signed)
Medication has been refilled and sent to [facility] 

## 2015-10-05 NOTE — Progress Notes (Signed)
NEVAAN, AHERNE ([id number]) Visit Report for 10/04/2015 Chief Complaint Document Details Patient Name: Seth Mcintosh, Seth Mcintosh Date of Service: 10/04/2015 8:45 AM Medical Record Number: [id number] Patient Account Number: [id number] Date of Birth/Sex: [birth year] (75 y.o. Male) Treating RN: Ahmed Prima Primary Care Physician: [facility], [place]ossie Der Other Clinician: Referring Physician: Keith Rake Treating Physician/Extender: Frann Rider in Treatment: 2 Information Obtained from: Patient Chief Complaint Patient seen for complaints of Non-Healing Wound which she's had on the right lower extremity for about 5 weeks Electronic Signature(s) Signed: 10/04/2015 9:56:44 AM By: Christin Fudge MD, FACS Entered By: Christin Fudge on 10/04/2015 09:56:44 Seth Mcintosh ([id number]) -------------------------------------------------------------------------------- Debridement Details Patient Name: Seth Mcintosh Date of Service: 10/04/2015 8:45 AM Medical Record Number: [id number] Patient Account Number: [id number] Date of Birth/Sex: 1941/03/04 (75 y.o. Male) Treating RN: Ahmed Prima Primary Care Physician: [facility], Dossie Der Other Clinician: Referring Physician: Keith Rake Treating Physician/Extender: Frann Rider in Treatment: 2 Debridement Performed for Wound #1 Right,Lateral Lower Leg Assessment: Performed By: Physician Christin Fudge, MD Debridement: Debridement Pre-procedure Yes Verification/Time Out Taken: Start Time: 09:36 Pain Control: Lidocaine 4% Topical Solution Level: Skin/Subcutaneous Tissue Total Area Debrided (L x 2.9 (cm) x 1.7 (cm) = 4.93 (cm) W): Tissue and other Viable, Non-Viable, Exudate, Fibrin/Slough, Subcutaneous material debrided: Instrument: Curette Bleeding: Minimum Hemostasis Achieved: Pressure End Time: 09:38 Procedural Pain: 0 Post Procedural Pain: 0 Response to Treatment: Procedure was tolerated well Post Debridement Measurements of Total Wound Length:  (cm) 2.9 Width: (cm) 1.7 Depth: (cm) 0.6 Volume: (cm) 2.323 Post Procedure Diagnosis Same as Pre-procedure Electronic Signature(s) Signed: 10/04/2015 9:56:39 AM By: Christin Fudge MD, FACS Signed: 10/04/2015 5:25:44 PM By: Alric Quan Entered By: Christin Fudge on 10/04/2015 09:56:39 Seth Mcintosh ([id number]) -------------------------------------------------------------------------------- HPI Details Patient Name: Seth Mcintosh Date of Service: 10/04/2015 8:45 AM Medical Record Number: [id number] Patient Account Number: [id number] Date of Birth/Sex: [birth year] (75 y.o. Male) Treating RN: Ahmed Prima Primary Care Physician: [facility], Dossie Der Other Clinician: Referring Physician: [facility], [facility] Treating Physician/Extender: Frann Rider in Treatment: 2 History of Present Illness Location: open wound right lower extremity lateral leg Quality: Patient reports experiencing a dull pain to affected area(s). Severity: Patient states wound are getting worse. Duration: Patient has had the wound for > 6 weeks prior to seeking treatment at the wound center Timing: Pain in wound is Intermittent (comes and goes Context: The wound occurred when the patient had a injury with a blunt object around 08/07/2015 Modifying Factors: a large swelling with drainage Associated Signs and Symptoms: Patient reports having increase swelling. HPI Description: 75 year old gentleman with known history of diabetes mellitus without complication had a lacerated wound to his right lower extremity which was repaired about 4 weeks ago. Was seen by his PCP Dr. Donneta Romberg with a open wound on the right lateral leg with some discharge and erythema and he was placed on a blocks and local dressing was applied and referred to [place] for further care. Past medical history significant for diabetes mellitus type 2, coronary artery disease, hypertension, arthritis, depression, foot surgery o2, back surgery, CABG, TURP, right  total knee replacement. He is a former smoker and quit in 1994. On questioning the patient he gives a history that he had a blunt injury to his right leg and this happened on April 15 of this year and about 2 weeks later he had blood draining from here and had to go to the ER and have sutures placed. However on looking at his medical records he was in the  ER on 05/17/2015 with a blunt injury on his right lower extremity and at that stage x-ray showed no fracture and the right lower leg ultrasound showed no evidence of DVT. Diagnosis of a hematoma was made and he was given symptomatic treatment for this. The next time he was seen in the emergency room was on 08/11/2015 when the patient had a 10 cm laceration to the right lateral leg and this was sutured by the ER physician Dr. Dahlia Client. His blood alcohol level was 162 mg percent that day and the sutures were to be removed in 10 days' time. 09/23/2015 -- had his ultrasound of the left lower extremity for possible hematoma and the impression was there was no discrete drainable hematoma in the area of clinical concern on the left lower extremity. On reviewing his history he clearly states that he never had a large lacerated wound due to the injury or fall on April 19. He says he did have a few drinks but has not drunk and the hematoma which he had for a couple of months just burst open and caused the big bleed and extrusion of clots. Electronic Signature(s) Signed: 10/04/2015 9:57:44 AM By: Christin Fudge MD, FACS Entered By: Christin Fudge on 10/04/2015 09:57:43 Seth Mcintosh ([id number]) -------------------------------------------------------------------------------- Physical Exam Details Patient Name: Seth Mcintosh Date of Service: 10/04/2015 8:45 AM Medical Record Number: [id number] Patient Account Number: [id number] Date of Birth/Sex: 05-17-1940 (75 y.o. Male) Treating RN: Ahmed Prima Primary Care Physician: [facility], [place]ossie Der Other  Clinician: Referring Physician: [facility], [facility] Treating Physician/Extender: Frann Rider in Treatment: 2 Constitutional . Pulse regular. Respirations normal and unlabored. Afebrile. . Eyes Nonicteric. Reactive to light. Ears, Nose, Mouth, and Throat Lips, teeth, and gums WNL.Marland Kitchen Moist mucosa without lesions. Neck supple and nontender. No palpable supraclavicular or cervical adenopathy. Normal sized without goiter. Respiratory WNL. No retractions.. Breath sounds WNL, No rubs, rales, rhonchi, or wheeze.. Cardiovascular Heart rhythm and rate regular, no murmur or gallop.. Pedal Pulses WNL. No clubbing, cyanosis or edema. Lymphatic No adneopathy. No adenopathy. No adenopathy. Musculoskeletal Adexa without tenderness or enlargement.. Digits and nails w/o clubbing, cyanosis, infection, petechiae, ischemia, or inflammatory conditions.. Integumentary (Hair, Skin) No suspicious lesions. No crepitus or fluctuance. No peri-wound warmth or erythema. No masses.Marland Kitchen Psychiatric Judgement and insight Intact.. No evidence of depression, anxiety, or agitation.. Notes the wound has some subcutaneous debris today having after washing it out well with saline and using a Q-tip and gauze to try and empty the pockets of seroma I have been able to sharply debride the subcutaneous debris with a curette and bleeding controlled with pressure. Electronic Signature(s) Signed: 10/04/2015 9:58:36 AM By: Christin Fudge MD, FACS Entered By: Christin Fudge on 10/04/2015 09:58:35 Seth Mcintosh ([id number]) -------------------------------------------------------------------------------- Physician Orders Details Patient Name: Seth Mcintosh Date of Service: 10/04/2015 8:45 AM Medical Record Number: [id number] Patient Account Number: [id number] Date of Birth/Sex: June 06, 1940 (75 y.o. Male) Treating RN: Ahmed Prima Primary Care Physician: [facility], [place]ossie Der Other Clinician: Referring Physician: [facility], [place]ossie Der Treating  Physician/Extender: Frann Rider in Treatment: 2 Verbal / Phone Orders: Yes ClinicianCarolyne Fiscal, Debi Read Back and Verified: Yes Diagnosis Coding Wound Cleansing Wound #1 Right,Lateral Lower Leg o Cleanse wound with mild soap and water o May Shower, gently pat wound dry prior to applying new dressing. Anesthetic Wound #1 Right,Lateral Lower Leg o Topical Lidocaine 4% cream applied to wound bed prior to debridement Skin Barriers/Peri-Wound Care Wound #1 Right,Lateral Lower Leg o Skin Prep Primary Wound Dressing Wound #1 Right,Lateral  Lower Leg o Aquacel Ag - rope Secondary Dressing Wound #1 Right,Lateral Lower Leg o Boardered Foam Dressing Dressing Change Frequency Wound #1 Right,Lateral Lower Leg o Change dressing every other day. - unless it is draining copious amounts Edema Control Wound #1 Right,Lateral Lower Leg o Elevate legs to the level of the heart and pump ankles as often as possible Additional Orders / Instructions Wound #1 Right,Lateral Lower Leg o Increase protein intake. Seth Mcintosh, Seth Mcintosh ([id number]) Electronic Signature(s) Signed: 10/04/2015 4:16:32 PM By: Christin Fudge MD, FACS Signed: 10/04/2015 5:25:44 PM By: Alric Quan Entered By: Alric Quan on 10/04/2015 09:37:25 Seth Mcintosh ([id number]) -------------------------------------------------------------------------------- Problem List Details Patient Name: Seth Mcintosh Date of Service: 10/04/2015 8:45 AM Medical Record Number: [id number] Patient Account Number: [id number] Date of Birth/Sex: [birth year] (75 y.o. 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 Male) Treating RN: Ahmed Prima Primary Care Physician: [facility], Dossie Der Other Clinician: Referring Physician: [facility], [facility] Treating Physician/Extender: Frann Rider in Treatment: 2 Subjective Chief Complaint Information obtained from Patient Patient seen for complaints of Non-Healing Wound which she's had on the right lower extremity for about 5 weeks History of Present Illness (HPI) The following HPI elements were documented for the patient's wound: Location: open wound right lower extremity lateral leg Quality: Patient reports experiencing a dull pain to affected area(s). Severity: Patient states wound are getting worse. Duration: Patient has had the wound for > 6 weeks prior to seeking treatment at the wound center Timing: Pain in wound is Intermittent (comes and goes Context: The wound occurred when the patient had a injury with a blunt object around 08/07/2015 Modifying Factors: a large swelling with drainage Associated Signs and Symptoms: Patient reports having increase swelling. 75 year old gentleman with known history of diabetes mellitus without complication had a lacerated wound to his right lower extremity which was repaired about 4 weeks ago. Was seen by his PCP Dr. Donneta Romberg with a open wound on the right lateral leg with some discharge  and erythema and he was placed on a blocks and local dressing was applied and referred to [place] for further care. Past medical history significant for diabetes mellitus type 2, coronary artery disease, hypertension, arthritis, depression, foot surgery o2, back surgery, CABG, TURP, right total knee replacement. He is a former smoker and quit in 1994. On questioning the patient he gives a history that he had a blunt injury to his right leg and this happened on April 15 of this year and about 2 weeks later he had blood draining from here and had to go to the ER and have sutures placed. However on looking at his medical records he was in the ER on 05/17/2015 with a blunt injury on his right lower extremity and at that stage x-ray showed no fracture and the right  lower leg ultrasound showed no evidence of DVT. Diagnosis of a hematoma was made and he was given symptomatic treatment for this. The next time he was seen in the emergency room was on 08/11/2015 when the patient had a 10 cm laceration to the right lateral leg and this was sutured by the ER physician Dr. Dahlia Client. His blood alcohol level was 162 mg percent that day and the sutures were to be removed in 10 days' time. 09/23/2015 -- had his ultrasound of the left lower extremity for possible hematoma and the impression was there was no discrete drainable hematoma in the area of clinical concern on the left lower extremity. On reviewing his history he clearly states that he never had a large lacerated wound due to the injury or fall on April 19. He says he did have a few drinks but has not drunk and the hematoma which he had for a Seth Mcintosh, Seth Mcintosh. ([id number]) couple of months just burst open and caused the big bleed and extrusion of clots. Objective Constitutional Pulse regular. Respirations normal and unlabored. Afebrile. Vitals Time Taken: 8:50 AM, Height: 74 in, Weight: 265 lbs, BMI: 34, Temperature: 97.8 F, Pulse: 54 bpm, Respiratory  Rate: 18 breaths/min, Blood Pressure: 153/76 mmHg. Eyes Nonicteric. Reactive to light. Ears, Nose, Mouth, and Throat Lips, teeth, and gums WNL.Marland Kitchen Moist mucosa without lesions. Neck supple and nontender. No palpable supraclavicular or cervical adenopathy. Normal sized without goiter. Respiratory WNL. No retractions.. Breath sounds WNL, No rubs, rales, rhonchi, or wheeze.. Cardiovascular Heart rhythm and rate regular, no murmur or gallop.. Pedal Pulses WNL. No clubbing, cyanosis or edema. Lymphatic No adneopathy. No adenopathy. No adenopathy. Musculoskeletal Adexa without tenderness or enlargement.. Digits and nails w/o clubbing, cyanosis, infection, petechiae, ischemia, or inflammatory conditions.Marland Kitchen Psychiatric Judgement and insight Intact.. No evidence of depression, anxiety, or agitation.. General Notes: the wound has some subcutaneous debris today having after washing it out well with saline and using a Q-tip and gauze to try and empty the pockets of seroma I have been able to sharply debride the subcutaneous debris with a curette and bleeding controlled with pressure. Integumentary (Hair, Skin) No suspicious lesions. No crepitus or fluctuance. No peri-wound warmth or erythema. No masses.. Wound #1 status is Open. Original cause of wound was Trauma. The wound is located on the [place] ([id number]) Lower Leg. The wound measures 2.9cm length x 1.7cm width x 0.6cm depth; 3.872cm^2 area and 2.323cm^3 volume. The wound is limited to skin breakdown. There is no tunneling noted, however, there is undermining starting at 1:00 and ending at 6:00 with a maximum distance of 3.3cm. There is a large amount of serosanguineous drainage noted. The wound margin is thickened. There is small (1-33%) granulation within the wound bed. There is a large (67-100%) amount of necrotic tissue within the wound bed including Adherent Slough. The periwound skin appearance exhibited: Localized  Edema, Moist. Periwound temperature was noted as No Abnormality. The periwound has tenderness on palpation. 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 There was a Skin/Subcutaneous Tissue Debridement [id number]) debridement with total area of 4.93 sq cm performed by Christin Fudge, MD. with the following instrument(s): Curette to remove Viable and Non-Viable tissue/material including Exudate,  Fibrin/Slough, and Subcutaneous after achieving pain control using Lidocaine 4% Topical Solution. A time out was conducted prior to the start of the procedure. A Minimum amount of bleeding was controlled with Pressure. The procedure was tolerated well with a pain level of 0 throughout and a pain level of 0 following the procedure. Post Debridement Measurements: 2.9cm length x 1.7cm width x 0.6cm depth; 2.323cm^3 volume. Post procedure Diagnosis Wound #1: Same as Pre-Procedure Plan Wound Cleansing: Wound #1 Right,Lateral Lower Leg: Cleanse wound with mild soap and water May Shower, gently pat wound dry prior to applying new dressing. Seth Mcintosh, Seth Mcintosh ([id number]) Anesthetic: Wound #1 Right,Lateral Lower Leg: Topical Lidocaine 4% cream applied to wound bed prior to debridement Skin Barriers/Peri-Wound Care: Wound #1 Right,Lateral Lower Leg: Skin Prep Primary Wound Dressing: Wound #1 Right,Lateral Lower Leg: Aquacel Ag - rope Secondary Dressing: Wound #1 Right,Lateral Lower Leg: Boardered Foam Dressing Dressing Change Frequency: Wound #1 Right,Lateral Lower Leg: Change dressing every other day. - unless it is draining copious amounts Edema Control: Wound #1 Right,Lateral Lower Leg: Elevate  legs to the level of the heart and pump ankles as often as possible Additional Orders / Instructions: Wound #1 Right,Lateral Lower Leg: Increase protein intake. I have cleant the wound nicely with a curette and forceps and irrigated it and made sure that there are no pockets with seroma or clots. We will continue with light packing of silver alginate and continue local care. 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 I have discussed signs and symptoms of inflammation or of sudden profuse bleeding and he is to report to the ER immediately if this happens. Electronic Signature(s) Signed: 10/04/2015 10:01:59 AM By: Christin Fudge MD, FACS Entered By: Christin Fudge on 10/04/2015 10:01:59 Seth Mcintosh ([id number]) -------------------------------------------------------------------------------- SuperBill Details Patient Name: Seth Mcintosh Date of Service: 10/04/2015 Medical Record Number: [id number] Patient Account Number: [id number] Date of Birth/Sex: 1940/07/15 (75 y.o. Male) Treating RN: Ahmed Prima Primary Care Physician: [facility], Dossie Der Other Clinician: Referring Physician: [facility], Dossie Der Treating Physician/Extender: Frann Rider in Treatment: 2 Diagnosis Coding ICD-10 Codes Code Description E11.622 Type 2 diabetes mellitus with other skin ulcer L97.213 Non-pressure chronic ulcer of right calf with necrosis of muscle M70.861 Other soft tissue disorders related to use, overuse and pressure, right lower leg Facility Procedures CPT4: Description Modifier Quantity Code [id number] - DEB SUBQ TISSUE 20 SQ CM/< 1 ICD-10 Description Diagnosis E11.622 Type 2 diabetes mellitus with other skin ulcer L97.213 Non-pressure chronic ulcer of right calf with necrosis of muscle M70.861  Other soft tissue disorders related to use, overuse and  pressure, right lower leg Physician Procedures CPT4: Description Modifier Quantity Code [id number] - WC PHYS SUBQ TISS 20 SQ CM 1 ICD-10 Description Diagnosis E11.622 Type 2 diabetes mellitus with other skin ulcer L97.213 Non-pressure chronic ulcer of right calf with necrosis of muscle M70.861  Other soft tissue disorders related to use, overuse and pressure, right lower leg Electronic Signature(s) Signed: 10/04/2015 10:02:09 AM By: Christin Fudge MD, FACS Entered By: Christin Fudge on 10/04/2015 10:02:09

## 2015-10-11 ENCOUNTER — Encounter: Payer: Medicare Other | Admitting: Surgery

## 2015-10-11 DIAGNOSIS — M70861 Other soft tissue disorders related to use, overuse and pressure, right lower leg: Secondary | ICD-10-CM | POA: Diagnosis not present

## 2015-10-11 DIAGNOSIS — L97213 Non-pressure chronic ulcer of right calf with necrosis of muscle: Secondary | ICD-10-CM | POA: Diagnosis not present

## 2015-10-11 DIAGNOSIS — I10 Essential (primary) hypertension: Secondary | ICD-10-CM | POA: Diagnosis not present

## 2015-10-11 DIAGNOSIS — E11622 Type 2 diabetes mellitus with other skin ulcer: Secondary | ICD-10-CM | POA: Diagnosis not present

## 2015-10-11 DIAGNOSIS — Z87891 Personal history of nicotine dependence: Secondary | ICD-10-CM | POA: Diagnosis not present

## 2015-10-11 DIAGNOSIS — I890 Lymphedema, not elsewhere classified: Secondary | ICD-10-CM | POA: Diagnosis not present

## 2015-10-11 DIAGNOSIS — I2510 Atherosclerotic heart disease of native coronary artery without angina pectoris: Secondary | ICD-10-CM | POA: Diagnosis not present

## 2015-10-12 NOTE — Progress Notes (Signed)
JAHDAI, BEYERS ([id number]) Visit Report for 10/11/2015 Chief Complaint Document Details Patient Name: Seth Mcintosh, Seth Mcintosh Date of Service: 10/11/2015 8:45 AM Medical Record Number: [id number] Patient Account Number: [id number] Date of Birth/Sex: [birth year] (75 y.o. Male) Treating RN: Ahmed Prima Primary Care Physician: [facility], [place] Other Clinician: Referring Physician: Keith Rake Treating Physician/Extender: Frann Rider in Treatment: 3 Information Obtained from: Patient Chief Complaint Patient seen for complaints of Non-Healing Wound which she's had on the right lower extremity for about 5 weeks Electronic Signature(s) Signed: 10/11/2015 8:42:22 AM By: Christin Fudge MD, FACS Entered By: Christin Fudge on 10/11/2015 08:42:22 Seth Mcintosh ([id number]) -------------------------------------------------------------------------------- HPI Details Patient Name: Seth Mcintosh Date of Service: 10/11/2015 8:45 AM Medical Record Number: [id number] Patient Account Number: [id number] Date of Birth/Sex: [birth year] (75 y.o. Male) Treating RN: Ahmed Prima Primary Care Physician: [facility], [place] Other Clinician: Referring Physician: [facility], [place] Treating Physician/Extender: Frann Rider in Treatment: 3 History of Present Illness Location: open wound right lower extremity lateral leg Quality: Patient reports experiencing a dull pain to affected area(s). Severity: Patient states wound are getting worse. Duration: Patient has had the wound for > 6 weeks prior to seeking treatment at the wound center Timing: Pain in wound is Intermittent (comes and goes Context: The wound occurred when the patient had a injury with a blunt object around 08/07/2015 Modifying Factors: a large swelling with drainage Associated Signs and Symptoms: Patient reports having increase swelling. HPI Description: 75 year old gentleman with known history of diabetes mellitus without complication had  a lacerated wound to his right lower extremity which was repaired about 4 weeks ago. Was seen by his PCP Dr. Donneta Romberg with a open wound on the right lateral leg with some discharge and erythema and he was placed on a blocks and local dressing was applied and referred to [place] for further care. Past medical history significant for diabetes mellitus type 2, coronary artery disease, hypertension, arthritis, depression, foot surgery o2, back surgery, CABG, TURP, right total knee replacement. He is a former smoker and quit in 1994. On questioning the patient he gives a history that he had a blunt injury to his right leg and this happened on April 15 of this year and about 2 weeks later he had blood draining from here and had to go to the ER and have sutures placed. However on looking at his medical records he was in the ER on 05/17/2015 with a blunt injury on his right lower extremity and at that stage x-ray showed no fracture and the right lower leg ultrasound showed no evidence of DVT. Diagnosis of a hematoma was made and he was given symptomatic treatment for this. The next time he was seen in the emergency room was on 08/11/2015 when the patient had a 10 cm laceration to the right lateral leg and this was sutured by the ER physician Dr. Dahlia Client. His blood alcohol level was 162 mg percent that day and the sutures were to be removed in 10 days' time. 09/23/2015 -- had his ultrasound of the left lower extremity for possible hematoma and the impression was there was no discrete drainable hematoma in the area of clinical concern on the left lower extremity. On reviewing his history he clearly states that he never had a large lacerated wound due to the injury or fall on April 19. He says he did have a few drinks but has not drunk and the hematoma which he had for a couple of months just burst open and caused  the big bleed and extrusion of clots. Electronic Signature(s) Signed: 10/11/2015 8:42:28 AM By:  Christin Fudge MD, FACS Entered By: Christin Fudge on 10/11/2015 08:42:28 Seth Mcintosh ([id number]) -------------------------------------------------------------------------------- Physical Exam Details Patient Name: Seth Mcintosh Date of Service: 10/11/2015 8:45 AM Medical Record Number: [id number] Patient Account Number: [id number] Date of Birth/Sex: 20-Nov-1940 (75 y.o. Male) Treating RN: Ahmed Prima Primary Care Physician: [facility], [place] Other Clinician: Referring Physician: [facility], [facility] Treating Physician/Extender: Frann Rider in Treatment: 3 Constitutional . Pulse regular. Respirations normal and unlabored. Afebrile. . Eyes Nonicteric. Reactive to light. Ears, Nose, Mouth, and Throat Lips, teeth, and gums WNL.Marland Kitchen Moist mucosa without lesions. Neck supple and nontender. No palpable supraclavicular or cervical adenopathy. Normal sized without goiter. Respiratory WNL. No retractions.. Cardiovascular Pedal Pulses WNL. No clubbing, cyanosis or edema. Lymphatic No adneopathy. No adenopathy. No adenopathy. Musculoskeletal Adexa without tenderness or enlargement.. Digits and nails w/o clubbing, cyanosis, infection, petechiae, ischemia, or inflammatory conditions.. Integumentary (Hair, Skin) No suspicious lesions. No crepitus or fluctuance. No peri-wound warmth or erythema. No masses.Marland Kitchen Psychiatric Judgement and insight Intact.. No evidence of depression, anxiety, or agitation.. Notes continues to look good is less in depth and there are no pockets of seroma or purulence. There is minimal debris which was washed out with moist saline gauze and no sharp debridement was required today. the surrounding skin and subcutaneous tissue does not show any evidence of cellulitis. Electronic Signature(s) Signed: 10/11/2015 8:43:24 AM By: Christin Fudge MD, FACS Entered By: Christin Fudge on 10/11/2015 08:43:24 Seth Mcintosh  ([id number]) -------------------------------------------------------------------------------- Physician Orders Details Patient Name: Seth Mcintosh Date of Service: 10/11/2015 8:45 AM Medical Record Number: [id number] Patient Account Number: [id number] Date of Birth/Sex: January 27, 1941 (75 y.o. Male) Treating RN: Ahmed Prima Primary Care Physician: [facility], [place] Der Other Clinician: Referring Physician: [facility], [place] Der Treating Physician/Extender: Frann Rider in Treatment: 3 Verbal / Phone Orders: Yes ClinicianCarolyne Fiscal, Debi Read Back and Verified: Yes Diagnosis Coding Wound Cleansing Wound #1 Right,Lateral Lower Leg o Cleanse wound with mild soap and water o May Shower, gently pat wound dry prior to applying new dressing. Anesthetic Wound #1 Right,Lateral Lower Leg o Topical Lidocaine 4% cream applied to wound bed prior to debridement Skin Barriers/Peri-Wound Care Wound #1 Right,Lateral Lower Leg o Skin Prep Primary Wound Dressing Wound #1 Right,Lateral Lower Leg o Aquacel Ag - rope Secondary Dressing Wound #1 Right,Lateral Lower Leg o Boardered Foam Dressing Dressing Change Frequency Wound #1 Right,Lateral Lower Leg o Change dressing every other day. - unless it is draining copious amounts Edema Control Wound #1 Right,Lateral Lower Leg o Elevate legs to the level of the heart and pump ankles as often as possible Additional Orders / Instructions Wound #1 Right,Lateral Lower Leg o Increase protein intake. Seth Mcintosh, Seth Mcintosh ([id number]) Electronic Signature(s) Signed: 10/11/2015 5:05:24 PM By: Christin Fudge MD, FACS Signed: 10/11/2015 5:39:56 PM By: Alric Quan Entered By: Alric Quan on 10/11/2015 08:40:32 Seth Mcintosh ([id number]) -------------------------------------------------------------------------------- Problem List Details Patient Name: Seth Mcintosh Date of Service: 10/11/2015 8:45 AM Medical Record Number: [id number] Patient  Account Number: [id number] Date of Birth/Sex: June 30, 1940 (75 y.o. 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 Male) Treating RN: Ahmed Prima Primary Care Physician: [facility], [place] Der Other Clinician: Referring Physician: Keith Rake Treating Physician/Extender: Frann Rider in Treatment: 3 Subjective Chief Complaint Information obtained from Patient Patient seen for complaints of Non-Healing Wound which she's had on the right lower extremity for about 5 weeks History of Present Illness (HPI) The following HPI elements were documented for the patient's wound: Location: open wound right lower extremity lateral leg Quality: Patient reports experiencing a dull pain to affected area(s). Severity: Patient states wound are getting worse. Duration: Patient has had the wound for > 6 weeks prior to seeking treatment at the wound center Timing: Pain in wound is Intermittent (comes and goes Context: The wound occurred when the patient had a injury with a  blunt object around 08/07/2015 Modifying Factors: a large swelling with drainage Associated Signs and Symptoms: Patient reports having increase swelling. 75 year old gentleman with known history of diabetes mellitus without complication had a lacerated wound to his right lower extremity which was repaired about 4 weeks ago. Was seen by his PCP Dr. Donneta Romberg with a open wound on the right lateral leg with some discharge and erythema and he was placed on a blocks and local dressing was applied and referred to [place] for further care. Past medical history significant for diabetes mellitus type 2, coronary artery disease, hypertension, arthritis, depression, foot surgery o2, back surgery, CABG, TURP, right total knee replacement. He is a former smoker and quit in 1994. On questioning the patient he gives a history that he had a blunt injury to his right leg and this happened on April 15 of this year and about 2 weeks later he had blood draining from here and had to go to the ER and have sutures placed. However on looking at his medical records he was in the ER on 05/17/2015 with a blunt injury on his right lower extremity and at that stage x-ray showed no fracture and the right lower leg ultrasound showed no evidence of DVT. Diagnosis of a hematoma was made and he was given symptomatic treatment for this. The next time he was seen in the emergency room was on 08/11/2015 when the patient had a 10 cm laceration to the right lateral leg and this was sutured by the ER physician Dr. Dahlia Client. His blood alcohol level was 162 mg percent that day and the sutures were to be removed in 10 days' time. 09/23/2015 -- had his ultrasound of the left lower extremity for possible hematoma and the impression was there was no discrete drainable hematoma in the area of clinical concern on the left lower extremity. On reviewing his history he clearly states that he never had a large lacerated wound due to the injury or  fall on April 19. He says he did have a few drinks but has not drunk and the hematoma which he had for a Seth Mcintosh, Seth Mcintosh. ([id number]) couple of months just burst open and caused the big bleed and extrusion of clots. Objective Constitutional Pulse regular. Respirations normal and unlabored. Afebrile. Vitals Time Taken: 8:25 AM, Height: 74 in, Weight: 265 lbs, BMI: 34, Temperature: 97.8 F, Pulse: 74  bpm, Respiratory Rate: 18 breaths/min, Blood Pressure: 140/82 mmHg. Eyes Nonicteric. Reactive to light. Ears, Nose, Mouth, and Throat Lips, teeth, and gums WNL.Marland Kitchen Moist mucosa without lesions. Neck supple and nontender. No palpable supraclavicular or cervical adenopathy. Normal sized without goiter. Respiratory WNL. No retractions.. Cardiovascular Pedal Pulses WNL. No clubbing, cyanosis or edema. Lymphatic No adneopathy. No adenopathy. No adenopathy. Musculoskeletal Adexa without tenderness or enlargement.. Digits and nails w/o clubbing, cyanosis, infection, petechiae, ischemia, or inflammatory conditions.Marland Kitchen Psychiatric Judgement and insight Intact.. No evidence of depression, anxiety, or agitation.. General Notes: continues to look good is less in depth and there are no pockets of seroma or purulence. There is minimal debris which was washed out with moist saline gauze and no sharp debridement was required today. the surrounding skin and subcutaneous tissue does not show any evidence of cellulitis. Integumentary (Hair, Skin) No suspicious lesions. No crepitus or fluctuance. No peri-wound warmth or erythema. No masses.. Wound #1 status is Open. Original cause of wound was Trauma. The wound is located on the [place] ([id number]) Lower Leg. The wound measures 3cm length x 1.9cm width x 0.6cm depth; 4.477cm^2 area and 2.686cm^3 volume. The wound is limited to skin breakdown. There is no tunneling noted, however, there is undermining starting at 3:00 and ending at 6:00  with a maximum distance of 3.2cm. There is a large amount of serosanguineous drainage noted. The wound margin is thickened. There is medium (34-66%) granulation within the wound bed. There is a medium (34-66%) amount of necrotic tissue within the wound bed including Adherent Slough. The periwound skin appearance exhibited: Localized Edema, Moist. Periwound temperature was noted as No Abnormality. The periwound has tenderness on palpation. 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 Anesthetic: Wound #1 Right,Lateral Lower Leg: Topical Lidocaine 4% cream applied to wound bed prior to debridement Skin Barriers/Peri-Wound Care: Wound #1 Right,Lateral Lower Leg: Skin Prep Primary Wound Dressing: Wound #1 Right,Lateral Lower Leg: Aquacel Ag - rope Secondary Dressing: Wound #1 Right,Lateral Lower Leg: Boardered Foam Dressing Dressing Change Frequency: Wound #1 Right,Lateral Lower Leg: Change dressing every other day. - unless it is draining copious amounts Edema Control: Wound #1 Right,Lateral Lower Leg: Elevate legs to the level of the heart and pump ankles as often as possible Additional Orders / Instructions: Seth Mcintosh, Seth Mcintosh ([id number]) Wound #1 Right,Lateral Lower Leg: Increase protein intake. We will continue with light packing of silver alginate and continue local care. 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 he has had all questions answered, his diabetes is  under good control and A1c is back next week. Electronic Signature(s) Signed: 10/11/2015 8:43:56 AM By: Christin Fudge MD, FACS Entered By: Christin Fudge on 10/11/2015 08:43:56 Seth Mcintosh ([id number]) -------------------------------------------------------------------------------- SuperBill Details Patient Name: Seth Mcintosh Date of Service: 10/11/2015 Medical Record Number: [id number] Patient Account Number: [id number] Date of Birth/Sex: 1940-08-01 (75 y.o. Male) Treating RN: Ahmed Prima Primary Care Physician: [facility], [place] Der Other Clinician: Referring Physician: [facility], [facility] Treating Physician/Extender: Frann Rider in  Treatment: 3 Diagnosis Coding ICD-10 Codes Code Description E11.622 Type 2 diabetes mellitus with other skin ulcer L97.213 Non-pressure chronic ulcer of right calf with necrosis of muscle M70.861 Other soft tissue disorders related to use, overuse and pressure, right lower leg Facility Procedures CPT4 Code: [id number] Description: 99213 - WOUND CARE VISIT-LEV 3 EST PT Modifier: Quantity: 1 Physician Procedures CPT4: Description Modifier Quantity Code [id number] - WC PHYS LEVEL 3 - EST PT 1 ICD-10 Description Diagnosis E11.622 Type 2 diabetes mellitus with other skin ulcer L97.213 Non-pressure chronic ulcer of right calf with necrosis of muscle M70.861 Other  soft tissue disorders related to use, overuse and pressure, right lower leg Electronic Signature(s) Signed: 10/11/2015 5:05:24 PM By: Christin Fudge MD, FACS Signed: 10/11/2015 5:39:56 PM By: Alric Quan Previous Signature: 10/11/2015 8:44:08 AM Version By: Christin Fudge MD, FACS Entered By: Alric Quan on 10/11/2015 08:53:58

## 2015-10-12 NOTE — Progress Notes (Signed)
AWESOME, LYNG ([id number]) Visit Report for 10/11/2015 Arrival Information Details Patient Name: Seth Mcintosh, Seth Mcintosh Date of Service: 10/11/2015 8:45 AM Medical Record Number: [id number] Patient Account Number: [id number] Date of Birth/Sex: 1940/11/24 (75 y.o. Male) Treating RN: Ahmed Prima Primary Care Physician: [facility], Dossie Der Other Clinician: Referring Physician: [facility], Dossie Der Treating Physician/Extender: Frann Rider in Treatment: 3 Visit Information History Since Last Visit All ordered tests and consults were completed: No Patient Arrived: Ambulatory Added or deleted any medications: No Arrival Time: 08:25 Any new allergies or adverse reactions: No Accompanied By: self Had a fall or experienced change in No Transfer Assistance: None activities of daily living that may affect Patient Identification Verified: Yes risk of falls: Secondary Verification Process Yes Signs or symptoms of abuse/neglect since last No Completed: visito Patient Requires Transmission-Based No Hospitalized since last visit: No Precautions: Pain Present Now: No Patient Has Alerts: Yes Patient Alerts: DM II Electronic Signature(s) Signed: 10/11/2015 5:39:56 PM By: Alric Quan Entered By: Alric Quan on 10/11/2015 08:25:24 Seth Mcintosh ([id number]) -------------------------------------------------------------------------------- Clinic Level of Care Assessment Details Patient Name: Seth Mcintosh Date of Service: 10/11/2015 8:45 AM Medical Record Number: [id number] Patient Account Number: [id number] Date of Birth/Sex: [birth year] (75 y.o. Male) Treating RN: Ahmed Prima Primary Care Physician: [facility], Dossie Der Other Clinician: Referring Physician: [facility], [facility] Treating Physician/Extender: Frann Rider in Treatment: 3 Clinic Level of Care Assessment Items TOOL 4 Quantity Score X - Use when only an EandM is performed on FOLLOW-UP visit 1 0 ASSESSMENTS - Nursing Assessment /  Reassessment X - Reassessment of Co-morbidities (includes updates in patient status) 1 10 X - Reassessment of Adherence to Treatment Plan 1 5 ASSESSMENTS - Wound and Skin Assessment / Reassessment X - Simple Wound Assessment / Reassessment - one wound 1 5 []  - Complex Wound Assessment / Reassessment - multiple wounds 0 []  - Dermatologic / Skin Assessment (not related to wound area) 0 ASSESSMENTS - Focused Assessment []  - Circumferential Edema Measurements - multi extremities 0 []  - Nutritional Assessment / Counseling / Intervention 0 []  - Lower Extremity Assessment (monofilament, tuning fork, pulses) 0 []  - Peripheral Arterial Disease Assessment (using hand held doppler) 0 ASSESSMENTS - Ostomy and/or Continence Assessment and Care []  - Incontinence Assessment and Management 0 []  - Ostomy Care Assessment and Management (repouching, etc.) 0 PROCESS - Coordination of Care X - Simple Patient / Family Education for ongoing care 1 15 []  - Complex (extensive) Patient / Family Education for ongoing care 0 []  - Staff obtains Programmer, systems, Records, Test Results / Process Orders 0 []  - Staff telephones HHA, Nursing Homes / Clarify orders / etc 0 []  - Routine Transfer to another Facility (non-emergent condition) 0 SHAFT, TIA ([id number]) []  - Routine Hospital Admission (non-emergent condition) 0 []  - New Admissions / Biomedical engineer / Ordering NPWT, Apligraf, etc. 0 []  - Emergency Hospital Admission (emergent condition) 0 X - Simple Discharge Coordination 1 10 []  - Complex (extensive) Discharge Coordination 0 PROCESS - Special Needs []  - Pediatric / Minor Patient Management 0 []  - Isolation Patient Management 0 []  - Hearing / Language / Visual special needs 0 []  - Assessment of Community assistance (transportation, D/C planning, etc.) 0 []  - Additional assistance / Altered mentation 0 []  - Support Surface(s) Assessment (bed, cushion, seat, etc.) 0 INTERVENTIONS - Wound Cleansing /  Measurement X - Simple Wound Cleansing - one wound 1 5 []  - Complex Wound Cleansing - multiple wounds 0 X - Wound Imaging (photographs - any number of  wounds) 1 5 []  - Wound Tracing (instead of photographs) 0 X - Simple Wound Measurement - one wound 1 5 []  - Complex Wound Measurement - multiple wounds 0 INTERVENTIONS - Wound Dressings []  - Small Wound Dressing one or multiple wounds 0 X - Medium Wound Dressing one or multiple wounds 1 15 []  - Large Wound Dressing one or multiple wounds 0 []  - Application of Medications - topical 0 []  - Application of Medications - injection 0 INTERVENTIONS - Miscellaneous []  - External ear exam 0 Seth Mcintosh, Seth Mcintosh ([id number]) []  - Specimen Collection (cultures, biopsies, blood, body fluids, etc.) 0 []  - Specimen(s) / Culture(s) sent or taken to Lab for analysis 0 []  - Patient Transfer (multiple staff / Harrel Lemon Lift / Similar devices) 0 []  - Simple Staple / Suture removal (25 or less) 0 []  - Complex Staple / Suture removal (26 or more) 0 []  - Hypo / Hyperglycemic Management (close monitor of Blood Glucose) 0 []  - Ankle / Brachial Index (ABI) - do not check if billed separately 0 X - Vital Signs 1 5 Has the patient been seen at the hospital within the last three years: Yes Total Score: 80 Level Of Care: New/Established - Level 3 Electronic Signature(s) Signed: 10/11/2015 5:39:56 PM By: Alric Quan Entered By: Alric Quan on 10/11/2015 08:53:49 Seth Mcintosh ([id number]) -------------------------------------------------------------------------------- Encounter Discharge Information Details Patient Name: Seth Mcintosh Date of Service: 10/11/2015 8:45 AM Medical Record Number: [id number] Patient Account Number: [id number] Date of Birth/Sex: 1941/03/14 (75 y.o. Male) Treating RN: Ahmed Prima Primary Care Physician: [facility], Dossie Der Other Clinician: Referring Physician: [facility], [facility] Treating Physician/Extender: Frann Rider in  Treatment: 3 Encounter Discharge Information Items Discharge Pain Level: 0 Discharge Condition: Stable Ambulatory Status: Ambulatory Discharge Destination: Home Transportation: Private Auto Accompanied By: self Schedule Follow-up Appointment: Yes Medication Reconciliation completed and provided to Patient/Care Yes Tahlor Berenguer: Provided on Clinical Summary of Care: 10/11/2015 Form Type Recipient Paper Patient JK Electronic Signature(s) Signed: 10/11/2015 8:46:53 AM By: Ruthine Dose Entered By: Ruthine Dose on 10/11/2015 08:46:53 Seth Mcintosh ([id number]) -------------------------------------------------------------------------------- Lower Extremity Assessment Details Patient Name: Seth Mcintosh Date of Service: 10/11/2015 8:45 AM Medical Record Number: [id number] Patient Account Number: [id number] Date of Birth/Sex: 08/22/1940 (75 y.o. Male) Treating RN: Ahmed Prima Primary Care Physician: [facility], Dossie Der Other Clinician: Referring Physician: [facility], [facility] Treating Physician/Extender: Frann Rider in Treatment: 3 Vascular Assessment Pulses: Posterior Tibial Dorsalis Pedis Palpable: [Right:Yes] Extremity colors, hair growth, and conditions: Extremity Color: [Right:Normal] Temperature of Extremity: [Right:Warm] Capillary Refill: [Right:< 3 seconds] Toe Nail Assessment Left: Right: Thick: No Discolored: No Deformed: No Improper Length and Hygiene: No Electronic Signature(s) Signed: 10/11/2015 5:39:56 PM By: Alric Quan Entered By: Alric Quan on 10/11/2015 08:28:13 Seth Mcintosh ([id number]) -------------------------------------------------------------------------------- Multi Wound Chart Details Patient Name: Seth Mcintosh Date of Service: 10/11/2015 8:45 AM Medical Record Number: [id number] Patient Account Number: [id number] Date of Birth/Sex: [birth year] (75 y.o. Male) Treating RN: Ahmed Prima Primary Care Physician: [facility], Dossie Der Other  Clinician: Referring Physician: [facility], [facility] Treating Physician/Extender: Frann Rider in Treatment: 3 Vital Signs Height(in): 74 Pulse(bpm): 74 Weight(lbs): 265 Blood Pressure 140/82 (mmHg): Body Mass Index(BMI): 34 Temperature(F): 97.8 Respiratory Rate 18 (breaths/min): Photos: [1:No Photos] [N/A:N/A] Wound Location: [1:Right Lower Leg - Lateral] [N/A:N/A] Wounding Event: [1:Trauma] [N/A:N/A] Primary Etiology: [1:Trauma, Other] [N/A:N/A] Comorbid History: [1:Glaucoma, Sleep Apnea, Coronary Artery Disease, Hypertension, Type II Diabetes, Osteoarthritis] [N/A:N/A] Date Acquired: [1:08/07/2015] [N/A:N/A] Weeks of Treatment: [1:3] [N/A:N/A] Wound Status: [1:Open] [N/A:N/A] Measurements L x W  x D 3x1.9x0.6 [N/A:N/A] (cm) Area (cm) : [1:4.477] [N/A:N/A] Volume (cm) : [1:2.686] [N/A:N/A] % Reduction in Area: [1:-14.00%] [N/A:N/A] % Reduction in Volume: 2.30% [N/A:N/A] Starting Position 1 3 (o'clock): Ending Position 1 [1:6] (o'clock): Maximum Distance 1 3.2 (cm): Undermining: [1:Yes] [N/A:N/A] Classification: [1:Full Thickness With Exposed Support Structures] [N/A:N/A] HBO Classification: [1:Grade 1] [N/A:N/A] Exudate Amount: [1:Large] [N/A:N/A] Exudate Type: [1:Serosanguineous] [N/A:N/A] Exudate Color: red, brown N/A N/A Wound Margin: Thickened N/A N/A Granulation Amount: Medium (34-66%) N/A N/A Necrotic Amount: Medium (34-66%) N/A N/A Exposed Structures: Fascia: No N/A N/A Fat: No Tendon: No Muscle: No Joint: No Bone: No Limited to Skin Breakdown Epithelialization: None N/A N/A Periwound Skin Texture: Edema: Yes N/A N/A Periwound Skin Moist: Yes N/A N/A Moisture: Periwound Skin Color: No Abnormalities Noted N/A N/A Temperature: No Abnormality N/A N/A Tenderness on Yes N/A N/A Palpation: Wound Preparation: Ulcer Cleansing: N/A N/A Rinsed/Irrigated with Saline Topical Anesthetic Applied: Other: lidocaine 4% Treatment Notes Electronic  Signature(s) Signed: 10/11/2015 5:39:56 PM By: Alric Quan Entered By: Alric Quan on 10/11/2015 08:34:17 Seth Mcintosh ([id number]) -------------------------------------------------------------------------------- Multi-Disciplinary Care Plan Details Patient Name: Seth Mcintosh Date of Service: 10/11/2015 8:45 AM Medical Record Number: [id number] Patient Account Number: [id number] Date of Birth/Sex: 1941/01/14 (75 y.o. Male) Treating RN: Ahmed Prima Primary Care Physician: [facility], Dossie Der Other Clinician: Referring Physician: [facility], Dossie Der Treating Physician/Extender: Frann Rider in Treatment: 3 Active Inactive Abuse / Safety / Falls / Self Care Management Nursing Diagnoses: Potential for falls Goals: Patient will remain injury free Date Initiated: 09/16/2015 Goal Status: Active Interventions: Assess fall risk on admission and as needed Notes: Orientation to the Wound Care Program Nursing Diagnoses: Knowledge deficit related to the wound healing center program Goals: Patient/caregiver will verbalize understanding of the [place] Program Date Initiated: 09/16/2015 Goal Status: Active Interventions: Provide education on orientation to the wound center Notes: Pain, Acute or Chronic Nursing Diagnoses: Pain, acute or chronic: actual or potential Potential alteration in comfort, pain Goals: Seth Mcintosh, Seth Mcintosh ([id number]) Patient will verbalize adequate pain control and receive pain control interventions during procedures as needed Date Initiated: 09/16/2015 Goal Status: Active Interventions: Assess comfort goal upon admission Complete pain assessment as per visit requirements Notes: Soft Tissue Infection Nursing Diagnoses: Impaired tissue integrity Goals: Patient will remain free of wound infection Date Initiated: 09/16/2015 Goal Status: Active Interventions: Assess signs and symptoms of infection every visit Notes: Wound/Skin  Impairment Nursing Diagnoses: Impaired tissue integrity Goals: Ulcer/skin breakdown will have a volume reduction of 30% by week 4 Date Initiated: 09/16/2015 Goal Status: Active Ulcer/skin breakdown will have a volume reduction of 50% by week 8 Date Initiated: 09/16/2015 Goal Status: Active Ulcer/skin breakdown will have a volume reduction of 80% by week 12 Date Initiated: 09/16/2015 Goal Status: Active Interventions: Assess patient/caregiver ability to obtain necessary supplies Assess ulceration(s) every visit Notes: Seth Mcintosh, Seth Mcintosh ([id number]) Electronic Signature(s) Signed: 10/11/2015 5:39:56 PM By: Alric Quan Entered By: Alric Quan on 10/11/2015 08:34:12 Seth Mcintosh ([id number]) -------------------------------------------------------------------------------- Pain Assessment Details Patient Name: Seth Mcintosh Date of Service: 10/11/2015 8:45 AM Medical Record Number: [id number] Patient Account Number: [id number] Date of Birth/Sex: 1941-04-22 (75 y.o. Male) Treating RN: Ahmed Prima Primary Care Physician: [facility], Dossie Der Other Clinician: Referring Physician: [facility], Dossie Der Treating Physician/Extender: Frann Rider in Treatment: 3 Active Problems Location of Pain Severity and Description of Pain Patient Has Paino No Site Locations Pain Management and Medication Current Pain Management: Electronic Signature(s) Signed: 10/11/2015 5:39:56 PM By: Alric Quan Entered By: Alric Quan on  10/11/2015 08:25:30 Seth Mcintosh, Seth Mcintosh ([id number]) -------------------------------------------------------------------------------- Patient/Caregiver Education Details Patient Name: Seth Mcintosh Date of Service: 10/11/2015 8:45 AM Medical Record Number: [id number] Patient Account Number: [id number] Date of Birth/Gender: [birth year] (75 y.o. Male) Treating RN: Ahmed Prima Primary Care Physician: [facility], Dossie Der Other Clinician: Referring Physician: [facility],  Dossie Der Treating Physician/Extender: Frann Rider in Treatment: 3 Education Assessment Education Provided To: Patient Education Topics Provided Wound/Skin Impairment: Handouts: Other: change dressing as ordered Methods: Demonstration, Explain/Verbal Responses: State content correctly Electronic Signature(s) Signed: 10/11/2015 5:39:56 PM By: Alric Quan Entered By: Alric Quan on 10/11/2015 08:38:50 Seth Mcintosh ([id number]) -------------------------------------------------------------------------------- Wound Assessment Details Patient Name: Seth Mcintosh Date of Service: 10/11/2015 8:45 AM Medical Record Number: [id number] Patient Account Number: [id number] Date of Birth/Sex: 02-17-1941 (75 y.o. Male) Treating RN: Ahmed Prima Primary Care Physician: [facility], Dossie Der Other Clinician: Referring Physician: [facility], Dossie Der Treating Physician/Extender: Frann Rider in Treatment: 3 Wound Status Wound Number: 1 Primary Trauma, Other Etiology: Wound Location: Right Lower Leg - Lateral Wound Open Wounding Event: Trauma Status: Date Acquired: 08/07/2015 Comorbid Glaucoma, Sleep Apnea, Coronary Weeks Of Treatment: 3 History: Artery Disease, Hypertension, Type II Clustered Wound: No Diabetes, Osteoarthritis Photos Photo Uploaded By: Alric Quan on 10/11/2015 16:56:22 Wound Measurements Length: (cm) 3 % Reduction in Width: (cm) 1.9 % Reduction in Depth: (cm) 0.6 Epithelializat Area: (cm) 4.477 Tunneling: Volume: (cm) 2.686 Undermining: Starting Po Ending Posi Maximum Dis Area: -14% Volume: 2.3% ion: None No Yes sition (o'clock): 3 tion (o'clock): 6 tance: (cm) 3.2 Wound Description Full Thickness With Exposed Classification: Support Structures Diabetic Severity Grade 1 (Wagner): Wound Margin: Thickened Exudate Amount: Large Exudate Type: Serosanguineous Seth Mcintosh, Seth Mcintosh ([id number]) Foul Odor After Cleansing: No Exudate Color:  red, brown Wound Bed Granulation Amount: Medium (34-66%) Exposed Structure Necrotic Amount: Medium (34-66%) Fascia Exposed: No Necrotic Quality: Adherent Slough Fat Layer Exposed: No Tendon Exposed: No Muscle Exposed: No Joint Exposed: No Bone Exposed: No Limited to Skin Breakdown Periwound Skin Texture Texture Color No Abnormalities Noted: No No Abnormalities Noted: No Localized Edema: Yes Temperature / Pain Moisture Temperature: No Abnormality No Abnormalities Noted: No Tenderness on Palpation: Yes Moist: Yes Wound Preparation Ulcer Cleansing: Rinsed/Irrigated with Saline Topical Anesthetic Applied: Other: lidocaine 4%, Treatment Notes Wound #1 (Right, Lateral Lower Leg) 1. Cleansed with: Clean wound with Normal Saline 2. Anesthetic Topical Lidocaine 4% cream to wound bed prior to debridement 3. Peri-wound Care: Skin Prep 4. Dressing Applied: Aquacel Ag 5. Secondary Dressing Applied Bordered Foam Dressing Electronic Signature(s) Signed: 10/11/2015 5:39:56 PM By: Alric Quan Entered By: Alric Quan on 10/11/2015 08:33:14 Seth Mcintosh ([id number]) -------------------------------------------------------------------------------- [place] Details Patient Name: Seth Mcintosh Date of Service: 10/11/2015 8:45 AM Medical Record Number: [id number] Patient Account Number: [id number] Date of Birth/Sex: [birth year] (75 y.o. Male) Treating RN: Ahmed Prima Primary Care Physician: [facility], Dossie Der Other Clinician: Referring Physician: [facility], [facility] Treating Physician/Extender: Frann Rider in Treatment: 3 Vital Signs Time Taken: 08:25 Temperature (F): 97.8 Height (in): 74 Pulse (bpm): 74 Weight (lbs): 265 Respiratory Rate (breaths/min): 18 Body Mass Index (BMI): 34 Blood Pressure (mmHg): 140/82 Reference Range: 80 - 120 mg / dl Electronic Signature(s) Signed: 10/11/2015 5:39:56 PM By: Alric Quan Entered By: Alric Quan on 10/11/2015  08:27:31

## 2015-10-18 ENCOUNTER — Encounter: Payer: Medicare Other | Admitting: Surgery

## 2015-10-18 DIAGNOSIS — M70861 Other soft tissue disorders related to use, overuse and pressure, right lower leg: Secondary | ICD-10-CM | POA: Diagnosis not present

## 2015-10-18 DIAGNOSIS — Z87891 Personal history of nicotine dependence: Secondary | ICD-10-CM | POA: Diagnosis not present

## 2015-10-18 DIAGNOSIS — S81801A Unspecified open wound, right lower leg, initial encounter: Secondary | ICD-10-CM | POA: Diagnosis not present

## 2015-10-18 DIAGNOSIS — L97213 Non-pressure chronic ulcer of right calf with necrosis of muscle: Secondary | ICD-10-CM | POA: Diagnosis not present

## 2015-10-18 DIAGNOSIS — E11622 Type 2 diabetes mellitus with other skin ulcer: Secondary | ICD-10-CM | POA: Diagnosis not present

## 2015-10-18 DIAGNOSIS — I2510 Atherosclerotic heart disease of native coronary artery without angina pectoris: Secondary | ICD-10-CM | POA: Diagnosis not present

## 2015-10-18 DIAGNOSIS — I10 Essential (primary) hypertension: Secondary | ICD-10-CM | POA: Diagnosis not present

## 2015-10-18 NOTE — Progress Notes (Addendum)
GIORGI, WINSTED ([id number]) Visit Report for 10/18/2015 Chief Complaint Document Details Patient Name: Seth Mcintosh, Seth Mcintosh Date of Service: 10/18/2015 8:45 AM Medical Record Number: [id number] Patient Account Number: [id number] Date of Birth/Sex: 01/05/1941 (75 y.o. Male) Treating RN: Ahmed Prima Primary Care Physician: [facility], [place] Other Clinician: Referring Physician: Keith Rake Treating Physician/Extender: Frann Rider in Treatment: 4 Information Obtained from: Patient Chief Complaint Patient seen for complaints of Non-Healing Wound which she's had on the right lower extremity for about 5 weeks Electronic Signature(s) Signed: 10/18/2015 9:19:25 AM By: Christin Fudge MD, FACS Entered By: Christin Fudge on 10/18/2015 09:19:25 Seth Mcintosh ([id number]) -------------------------------------------------------------------------------- Debridement Details Patient Name: Seth Mcintosh Date of Service: 10/18/2015 8:45 AM Medical Record Number: [id number] Patient Account Number: [id number] Date of Birth/Sex: 07-Mar-1941 (75 y.o. Male) Treating RN: Ahmed Prima Primary Care Physician: [facility], [place] Other Clinician: Referring Physician: Keith Rake Treating Physician/Extender: Frann Rider in Treatment: 4 Debridement Performed for Wound #1 Right,Lateral Lower Leg Assessment: Performed By: Physician Christin Fudge, MD Debridement: Debridement Pre-procedure Yes Verification/Time Out Taken: Start Time: 09:09 Pain Control: Lidocaine 4% Topical Solution Level: Skin/Subcutaneous Tissue Total Area Debrided (L x 2.5 (cm) x 1.5 (cm) = 3.75 (cm) W): Tissue and other Viable, Non-Viable, Exudate, Fibrin/Slough, Subcutaneous material debrided: Instrument: Curette Bleeding: Minimum Hemostasis Achieved: Pressure End Time: 09:11 Procedural Pain: 0 Post Procedural Pain: 0 Response to Treatment: Procedure was tolerated well Post Debridement Measurements of Total Wound Length:  (cm) 2.5 Width: (cm) 1.5 Depth: (cm) 0.5 Volume: (cm) 1.473 Post Procedure Diagnosis Same as Pre-procedure Electronic Signature(s) Signed: 10/18/2015 9:19:19 AM By: Christin Fudge MD, FACS Signed: 10/18/2015 5:53:23 PM By: Alric Quan Entered By: Christin Fudge on 10/18/2015 09:19:19 Seth Mcintosh ([id number]) -------------------------------------------------------------------------------- HPI Details Patient Name: Seth Mcintosh Date of Service: 10/18/2015 8:45 AM Medical Record Number: [id number] Patient Account Number: [id number] Date of Birth/Sex: 1941-02-26 (75 y.o. Male) Treating RN: Ahmed Prima Primary Care Physician: [facility], [place] Other Clinician: Referring Physician: [facility], [facility] Treating Physician/Extender: Frann Rider in Treatment: 4 History of Present Illness Location: open wound right lower extremity lateral leg Quality: Patient reports experiencing a dull pain to affected area(s). Severity: Patient states wound are getting worse. Duration: Patient has had the wound for > 6 weeks prior to seeking treatment at the wound center Timing: Pain in wound is Intermittent (comes and goes Context: The wound occurred when the patient had a injury with a blunt object around 08/07/2015 Modifying Factors: a large swelling with drainage Associated Signs and Symptoms: Patient reports having increase swelling. HPI Description: 75 year old gentleman with known history of diabetes mellitus without complication had a lacerated wound to his right lower extremity which was repaired about 4 weeks ago. Was seen by his PCP Dr. Donneta Romberg with a open wound on the right lateral leg with some discharge and erythema and he was placed on a blocks and local dressing was applied and referred to [place] for further care. Past medical history significant for diabetes mellitus type 2, coronary artery disease, hypertension, arthritis, depression, foot surgery o2, back surgery, CABG, TURP, right  total knee replacement. He is a former smoker and quit in 1994. On questioning the patient he gives a history that he had a blunt injury to his right leg and this happened on April 15 of this year and about 2 weeks later he had blood draining from here and had to go to the ER and have sutures placed. However on looking at his medical records he was in the  ER on 05/17/2015 with a blunt injury on his right lower extremity and at that stage x-ray showed no fracture and the right lower leg ultrasound showed no evidence of DVT. Diagnosis of a hematoma was made and he was given symptomatic treatment for this. The next time he was seen in the emergency room was on 08/11/2015 when the patient had a 10 cm laceration to the right lateral leg and this was sutured by the ER physician Dr. Dahlia Client. His blood alcohol level was 162 mg percent that day and the sutures were to be removed in 10 days' time. 09/23/2015 -- had his ultrasound of the left lower extremity for possible hematoma and the impression was there was no discrete drainable hematoma in the area of clinical concern on the left lower extremity. On reviewing his history he clearly states that he never had a large lacerated wound due to the injury or fall on April 19. He says he did have a few drinks but has not drunk and the hematoma which he had for a couple of months just burst open and caused the big bleed and extrusion of clots. Electronic Signature(s) Signed: 10/18/2015 9:19:30 AM By: Christin Fudge MD, FACS Entered By: Christin Fudge on 10/18/2015 09:19:30 Seth Mcintosh ([id number]) -------------------------------------------------------------------------------- Physical Exam Details Patient Name: Seth Mcintosh Date of Service: 10/18/2015 8:45 AM Medical Record Number: [id number] Patient Account Number: [id number] Date of Birth/Sex: 23-Nov-1940 (75 y.o. Male) Treating RN: Ahmed Prima Primary Care Physician: [facility], [place] Other  Clinician: Referring Physician: [facility], [facility] Treating Physician/Extender: Frann Rider in Treatment: 4 Constitutional . Pulse regular. Respirations normal and unlabored. Afebrile. . Eyes Nonicteric. Reactive to light. Ears, Nose, Mouth, and Throat Lips, teeth, and gums WNL.Marland Kitchen Moist mucosa without lesions. Neck supple and nontender. No palpable supraclavicular or cervical adenopathy. Normal sized without goiter. Respiratory WNL. No retractions.. Cardiovascular Pedal Pulses WNL. No clubbing, cyanosis or edema. Lymphatic No adneopathy. No adenopathy. No adenopathy. Musculoskeletal Adexa without tenderness or enlargement.. Digits and nails w/o clubbing, cyanosis, infection, petechiae, ischemia, or inflammatory conditions.. Integumentary (Hair, Skin) No suspicious lesions. No crepitus or fluctuance. No peri-wound warmth or erythema. No masses.Marland Kitchen Psychiatric Judgement and insight Intact.. No evidence of depression, anxiety, or agitation.. Notes the wound continues to have subcutaneous debris which have sharply removed with a #3 curet and bleeding controlled with pressure. The undermining inferiorly continues but there is no evidence of surrounding cellulitis or purulent fluid. Electronic Signature(s) Signed: 10/18/2015 9:20:27 AM By: Christin Fudge MD, FACS Entered By: Christin Fudge on 10/18/2015 09:20:26 Seth Mcintosh ([id number]) -------------------------------------------------------------------------------- Physician Orders Details Patient Name: Seth Mcintosh Date of Service: 10/18/2015 8:45 AM Medical Record Number: [id number] Patient Account Number: [id number] Date of Birth/Sex: 09-Jun-1940 (75 y.o. Male) Treating RN: Ahmed Prima Primary Care Physician: [facility], [place] Other Clinician: Referring Physician: [facility], [place] Treating Physician/Extender: Frann Rider in Treatment: 4 Verbal / Phone Orders: Yes ClinicianCarolyne Fiscal, Debi Read Back and Verified:  Yes Diagnosis Coding Wound Cleansing Wound #1 Right,Lateral Lower Leg o Cleanse wound with mild soap and water o May Shower, gently pat wound dry prior to applying new dressing. Anesthetic Wound #1 Right,Lateral Lower Leg o Topical Lidocaine 4% cream applied to wound bed prior to debridement Skin Barriers/Peri-Wound Care Wound #1 Right,Lateral Lower Leg o Skin Prep Primary Wound Dressing Wound #1 Right,Lateral Lower Leg o Aquacel Ag - rope Secondary Dressing Wound #1 Right,Lateral Lower Leg o Boardered Foam Dressing Dressing Change Frequency Wound #1 Right,Lateral Lower Leg o Change dressing every  day. Edema Control Wound #1 Right,Lateral Lower Leg o Elevate legs to the level of the heart and pump ankles as often as possible o Support Garment 20-30 mm/Hg pressure to: - wear compression stockings Additional Orders / Instructions Wound #1 Right,Lateral Lower Leg o Increase protein intake. Seth Mcintosh, Seth Mcintosh ([id number]) Electronic Signature(s) Signed: 10/18/2015 4:13:37 PM By: Christin Fudge MD, FACS Signed: 10/18/2015 5:53:23 PM By: Alric Quan Entered By: Alric Quan on 10/18/2015 09:15:31 Seth Mcintosh ([id number]) -------------------------------------------------------------------------------- Problem List Details Patient Name: Seth Mcintosh Date of Service: 10/18/2015 8:45 AM Medical Record Number: [id number] Patient Account Number: [id number] Date of Birth/Sex: 09-Aug-1940 (75 y.o. 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 Male) Treating RN: Ahmed Prima Primary Care Physician: [facility], [place] Other Clinician: Referring Physician: [facility], [facility] Treating Physician/Extender: Frann Rider in Treatment: 4 Subjective Chief Complaint Information obtained from Patient Patient seen for complaints of Non-Healing Wound which she's had on the right lower extremity for about 5 weeks History of Present Illness (HPI) The following HPI elements were documented for the patient's wound: Location: open wound right lower extremity lateral leg Quality: Patient reports experiencing a dull pain to affected area(s). Severity: Patient states wound are getting worse. Duration: Patient has had the wound for > 6 weeks prior to seeking treatment at the wound center Timing: Pain in wound is Intermittent (comes and goes Context: The wound occurred when the patient had a injury with a blunt object around 08/07/2015 Modifying Factors: a large swelling with drainage Associated Signs and Symptoms: Patient reports having increase swelling. 75 year old gentleman with known history of diabetes mellitus without complication had a lacerated wound to his right lower extremity which was repaired about 4 weeks ago. Was seen by his PCP Dr. Donneta Romberg with a open wound on the right lateral leg with some discharge and erythema and he was placed on a blocks and local dressing was applied and referred to [place] for further  care. Past medical history significant for diabetes mellitus type 2, coronary artery disease, hypertension, arthritis, depression, foot surgery o2, back surgery, CABG, TURP, right total knee replacement. He is a former smoker and quit in 1994. On questioning the patient he gives a history that he had a blunt injury to his right leg and this happened on April 15 of this year and about 2 weeks later he had blood draining from here and had to go to the ER and have sutures placed. However on looking at his medical records he was in the ER on 05/17/2015 with a blunt injury on his right lower extremity and at that stage x-ray showed no fracture and the right lower leg ultrasound showed no evidence of DVT. Diagnosis of a hematoma was made and he was given symptomatic treatment for this. The next time he was  seen in the emergency room was on 08/11/2015 when the patient had a 10 cm laceration to the right lateral leg and this was sutured by the ER physician Dr. Dahlia Client. His blood alcohol level was 162 mg percent that day and the sutures were to be removed in 10 days' time. 09/23/2015 -- had his ultrasound of the left lower extremity for possible hematoma and the impression was there was no discrete drainable hematoma in the area of clinical concern on the left lower extremity. On reviewing his history he clearly states that he never had a large lacerated wound due to the injury or fall on April 19. He says he did have a few drinks but has not drunk and the hematoma which he had for a Seth Mcintosh, Seth Mcintosh. ([id number]) couple of months just burst open and caused the big bleed and extrusion of clots. Objective Constitutional Pulse regular. Respirations normal and unlabored. Afebrile. Vitals Time Taken: 8:51 AM, Height: 74 in, Weight: 265 lbs, BMI: 34, Temperature: 97.9 F, Pulse: 61 bpm, Respiratory Rate: 18 breaths/min, Blood Pressure: 128/80 mmHg. Eyes Nonicteric. Reactive to light. Ears, Nose, Mouth,  and Throat Lips, teeth, and gums WNL.Marland Kitchen Moist mucosa without lesions. Neck supple and nontender. No palpable supraclavicular or cervical adenopathy. Normal sized without goiter. Respiratory WNL. No retractions.. Cardiovascular Pedal Pulses WNL. No clubbing, cyanosis or edema. Lymphatic No adneopathy. No adenopathy. No adenopathy. Musculoskeletal Adexa without tenderness or enlargement.. Digits and nails w/o clubbing, cyanosis, infection, petechiae, ischemia, or inflammatory conditions.Marland Kitchen Psychiatric Judgement and insight Intact.. No evidence of depression, anxiety, or agitation.. General Notes: the wound continues to have subcutaneous debris which have sharply removed with a #3 curet and bleeding controlled with pressure. The undermining inferiorly continues but there is no evidence of surrounding cellulitis or purulent fluid. Integumentary (Hair, Skin) No suspicious lesions. No crepitus or fluctuance. No peri-wound warmth or erythema. No masses.. Wound #1 status is Open. Original cause of wound was Trauma. The wound is located on the [place] ([id number]) Lower Leg. The wound measures 2.5cm length x 1.5cm width x 0.5cm depth; 2.945cm^2 area and 1.473cm^3 volume. The wound is limited to skin breakdown. There is no tunneling noted, however, there is undermining starting at 3:00 and ending at 6:00 with a maximum distance of 3cm. There is a large amount of serosanguineous drainage noted. The wound margin is thickened. There is medium (34-66%) granulation within the wound bed. There is a medium (34-66%) amount of necrotic tissue within the wound bed including Adherent Slough. The periwound skin appearance exhibited: Localized Edema, Moist. Periwound temperature was noted as No Abnormality. The periwound has tenderness on palpation. 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 There was a Skin/Subcutaneous Tissue Debridement [id number]) debridement with total area of 3.75 sq cm performed by Christin Fudge, MD. with the following instrument(s): Curette to remove Viable and Non-Viable tissue/material including Exudate, Fibrin/Slough, and Subcutaneous after achieving pain control using Lidocaine 4% Topical Solution. A time out was conducted prior to the start of the procedure. A Minimum amount of bleeding was controlled with Pressure. The procedure was tolerated well with a pain level of 0 throughout and a pain level of 0 following the procedure. Post Debridement Measurements:  2.5cm length x 1.5cm width x 0.5cm depth; 1.473cm^3 volume. Post procedure Diagnosis Wound #1: Same as Pre-Procedure Plan Wound Cleansing: Wound #1 Right,Lateral Lower Leg: Cleanse wound with mild soap and water May Shower, gently pat wound dry prior to applying new dressing. Seth Mcintosh, Seth Mcintosh ([id number]) Anesthetic: Wound #1 Right,Lateral Lower Leg: Topical Lidocaine 4% cream applied to wound bed prior to debridement Skin Barriers/Peri-Wound Care: Wound #1 Right,Lateral Lower Leg: Skin Prep Primary Wound Dressing: Wound #1 Right,Lateral Lower Leg: Aquacel Ag - rope Secondary Dressing: Wound #1 Right,Lateral Lower Leg: Boardered Foam Dressing Dressing Change Frequency: Wound #1 Right,Lateral Lower Leg: Change dressing every day. Edema Control: Wound #1 Right,Lateral Lower Leg: Elevate legs to the level of the heart and pump ankles as often as possible Support Garment 20-30 mm/Hg pressure to: - wear compression stockings Additional Orders / Instructions: Wound #1 Right,Lateral Lower Leg: Increase protein intake. We will continue with light packing of silver alginate and  continue local care. 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 Electronic Signature(s) Signed: 10/18/2015 9:21:34 AM By: Christin Fudge MD, FACS Entered By: Christin Fudge on 10/18/2015 09:21:34 Seth Mcintosh ([id number]) -------------------------------------------------------------------------------- [place] Details Patient Name: Seth Mcintosh Date of Service: 10/18/2015 Medical Record Number: [id number] Patient Account Number: [id number] Date of Birth/Sex: 06/28/1940 (75 y.o. Male) Treating RN: Ahmed Prima Primary Care Physician: [facility], [place] Der Other Clinician: Referring Physician: [facility], [place] Der Treating Physician/Extender: Frann Rider in Treatment: 4 Diagnosis Coding ICD-10 Codes Code Description E11.622 Type 2 diabetes mellitus with other skin ulcer L97.213 Non-pressure chronic ulcer of right calf with necrosis of muscle M70.861 Other soft tissue disorders related to use, overuse and pressure, right lower leg Facility Procedures CPT4: Description Modifier Quantity Code [id number] - DEB SUBQ TISSUE 20 SQ CM/< 1 ICD-10 Description Diagnosis E11.622 Type 2 diabetes mellitus with other skin ulcer L97.213 Non-pressure chronic ulcer of right calf with necrosis of muscle M70.861  Other soft tissue disorders related to use, overuse and pressure, right lower leg Physician Procedures CPT4: Description Modifier Quantity Code [id number] - WC PHYS SUBQ TISS 20 SQ CM 1 ICD-10 Description Diagnosis E11.622 Type 2 diabetes mellitus with other skin ulcer L97.213 Non-pressure chronic ulcer of right calf with necrosis of muscle M70.861  Other soft tissue disorders related to use, overuse and pressure, right lower leg Electronic Signature(s) Signed: 10/18/2015  9:21:44 AM By: Christin Fudge MD, FACS Entered By: Christin Fudge on 10/18/2015 09:21:44

## 2015-10-18 NOTE — Progress Notes (Addendum)
KENDRIC, SEWARD ([id number]) Visit Report for 10/18/2015 Arrival Information Details Patient Name: PENDLETON, ANTE Date of Service: 10/18/2015 8:45 AM Medical Record Number: [id number] Patient Account Number: [id number] Date of Birth/Sex: 1940-06-22 (75 y.o. Male) Treating RN: Ahmed Prima Primary Care Physician: [facility], [place] Der Other Clinician: Referring Physician: [facility], [place] Der Treating Physician/Extender: Frann Rider in Treatment: 4 Visit Information History Since Last Visit All ordered tests and consults were completed: No Patient Arrived: Ambulatory Added or deleted any medications: No Arrival Time: 08:51 Any new allergies or adverse reactions: No Accompanied By: self Had a fall or experienced change in No Transfer Assistance: None activities of daily living that may affect Patient Identification Verified: Yes risk of falls: Secondary Verification Process Yes Signs or symptoms of abuse/neglect since last No Completed: visito Patient Requires Transmission-Based No Hospitalized since last visit: No Precautions: Pain Present Now: No Patient Has Alerts: Yes Patient Alerts: DM II Electronic Signature(s) Signed: 10/18/2015 5:53:23 PM By: Alric Quan Entered By: Alric Quan on 10/18/2015 08:51:30 [name] ([id number]) -------------------------------------------------------------------------------- Encounter Discharge Information Details Patient Name: [name] Date of Service: 10/18/2015 8:45 AM Medical Record Number: [id number] Patient Account Number: [id number] Date of Birth/Sex: 08/30/1940 (75 y.o. Male) Treating RN: Ahmed Prima Primary Care Physician: [facility], [place] Der Other Clinician: Referring Physician: [facility], [facility] Treating Physician/Extender: Frann Rider in Treatment: 4 Encounter Discharge Information Items Discharge Pain Level: 0 Discharge Condition: Stable Ambulatory Status: Ambulatory Discharge Destination:  Home Transportation: Private Auto Accompanied By: self Schedule Follow-up Appointment: Yes Medication Reconciliation completed and provided to Patient/Care Yes Melani Brisbane: Provided on Clinical Summary of Care: 10/18/2015 Form Type Recipient Paper Patient JK Electronic Signature(s) Signed: 10/18/2015 9:23:33 AM By: Ruthine Dose Entered By: Ruthine Dose on 10/18/2015 09:23:33 [name] ([id number]) -------------------------------------------------------------------------------- Lower Extremity Assessment Details Patient Name: [name] Date of Service: 10/18/2015 8:45 AM Medical Record Number: [id number] Patient Account Number: [id number] Date of Birth/Sex: [birth year] (75 y.o. Male) Treating RN: Ahmed Prima Primary Care Physician: [facility], [place] Der Other Clinician: Referring Physician: [facility], [place] Treating Physician/Extender: Frann Rider in Treatment: 4 Vascular Assessment Pulses: Posterior Tibial Dorsalis Pedis Palpable: [Right:Yes] Extremity colors, hair growth, and conditions: Extremity Color: [Right:Mottled] Temperature of Extremity: [Right:Warm] Capillary Refill: [Right:< 3 seconds] Toe Nail Assessment Left: Right: Thick: No Discolored: No Deformed: No Improper Length and Hygiene: No Electronic Signature(s) Signed: 10/18/2015 5:53:23 PM By: Alric Quan Entered By: Alric Quan on 10/18/2015 09:13:41 [name] ([id number]) -------------------------------------------------------------------------------- Multi Wound Chart Details Patient Name: [name] Date of Service: 10/18/2015 8:45 AM Medical Record Number: [id number] Patient Account Number: [id number] Date of Birth/Sex: 08/31/1940 (75 y.o. Male) Treating RN: Ahmed Prima Primary Care Physician: [facility], [place] Der Other Clinician: Referring Physician: [facility], [facility] Treating Physician/Extender: Frann Rider in Treatment: 4 Vital Signs Height(in): 74 Pulse(bpm):  61 Weight(lbs): 265 Blood Pressure 128/80 (mmHg): Body Mass Index(BMI): 34 Temperature(F): 97.9 Respiratory Rate 18 (breaths/min): Photos: [1:No Photos] [N/A:N/A] Wound Location: [1:Right Lower Leg - Lateral] [N/A:N/A] Wounding Event: [1:Trauma] [N/A:N/A] Primary Etiology: [1:Trauma, Other] [N/A:N/A] Comorbid History: [1:Glaucoma, Sleep Apnea, Coronary Artery Disease, Hypertension, Type II Diabetes, Osteoarthritis] [N/A:N/A] Date Acquired: [1:08/07/2015] [N/A:N/A] Weeks of Treatment: [1:4] [N/A:N/A] Wound Status: [1:Open] [N/A:N/A] Measurements L x W x D 2.5x1.5x0.5 [N/A:N/A] (cm) Area (cm) : [1:2.945] [N/A:N/A] Volume (cm) : [1:1.473] [N/A:N/A] % Reduction in Area: [1:25.00%] [N/A:N/A] % Reduction in Volume: 46.40% [N/A:N/A] Starting Position 1 3 (o'clock): Ending Position 1 [1:6] (o'clock): Maximum Distance 1 3 (cm): Undermining: [1:Yes] [N/A:N/A] Classification: [1:Full Thickness With Exposed Support Structures] [N/A:N/A]  HBO Classification: [1:Grade 1] [N/A:N/A] Exudate Amount: [1:Large] [N/A:N/A] Exudate Type: [1:Serosanguineous] [N/A:N/A] Exudate Color: red, brown N/A N/A Wound Margin: Thickened N/A N/A Granulation Amount: Medium (34-66%) N/A N/A Necrotic Amount: Medium (34-66%) N/A N/A Exposed Structures: Fascia: No N/A N/A Fat: No Tendon: No Muscle: No Joint: No Bone: No Limited to Skin Breakdown Epithelialization: None N/A N/A Periwound Skin Texture: Edema: Yes N/A N/A Periwound Skin Moist: Yes N/A N/A Moisture: Periwound Skin Color: No Abnormalities Noted N/A N/A Temperature: No Abnormality N/A N/A Tenderness on Yes N/A N/A Palpation: Wound Preparation: Ulcer Cleansing: N/A N/A Rinsed/Irrigated with Saline Topical Anesthetic Applied: Other: lidocaine 4% Treatment Notes Electronic Signature(s) Signed: 10/18/2015 5:53:23 PM By: Alric Quan Entered By: Alric Quan on 10/18/2015 09:06:20 [name]  ([id number]) -------------------------------------------------------------------------------- Multi-Disciplinary Care Plan Details Patient Name: [name] Date of Service: 10/18/2015 8:45 AM Medical Record Number: [id number] Patient Account Number: [id number] Date of Birth/Sex: 1940/05/11 (75 y.o. Male) Treating RN: Ahmed Prima Primary Care Physician: [facility], [place] Der Other Clinician: Referring Physician: [facility], [place] Der Treating Physician/Extender: Frann Rider in Treatment: 4 Active Inactive Abuse / Safety / Falls / Self Care Management Nursing Diagnoses: Potential for falls Goals: Patient will remain injury free Date Initiated: 09/16/2015 Goal Status: Active Interventions: Assess fall risk on admission and as needed Notes: Orientation to the Wound Care Program Nursing Diagnoses: Knowledge deficit related to the wound healing center program Goals: Patient/caregiver will verbalize understanding of the [place] Program Date Initiated: 09/16/2015 Goal Status: Active Interventions: Provide education on orientation to the wound center Notes: Pain, Acute or Chronic Nursing Diagnoses: Pain, acute or chronic: actual or potential Potential alteration in comfort, pain Goals: CAIDEN, KOLDEN ([id number]) Patient will verbalize adequate pain control and receive pain control interventions during procedures as needed Date Initiated: 09/16/2015 Goal Status: Active Interventions: Assess comfort goal upon admission Complete pain assessment as per visit requirements Notes: Soft Tissue Infection Nursing Diagnoses: Impaired tissue integrity Goals: Patient will remain free of wound infection Date Initiated: 09/16/2015 Goal Status: Active Interventions: Assess signs and symptoms of infection every visit Notes: Wound/Skin Impairment Nursing Diagnoses: Impaired tissue integrity Goals: Ulcer/skin breakdown will have a volume reduction of 30% by week 4 Date  Initiated: 09/16/2015 Goal Status: Active Ulcer/skin breakdown will have a volume reduction of 50% by week 8 Date Initiated: 09/16/2015 Goal Status: Active Ulcer/skin breakdown will have a volume reduction of 80% by week 12 Date Initiated: 09/16/2015 Goal Status: Active Interventions: Assess patient/caregiver ability to obtain necessary supplies Assess ulceration(s) every visit Notes: BERKLEY, MAYBIN ([id number]) Electronic Signature(s) Signed: 10/18/2015 5:53:23 PM By: Alric Quan Entered By: Alric Quan on 10/18/2015 09:06:13 [name] ([id number]) -------------------------------------------------------------------------------- Pain Assessment Details Patient Name: [name] Date of Service: 10/18/2015 8:45 AM Medical Record Number: [id number] Patient Account Number: [id number] Date of Birth/Sex: [birth year] (75 y.o. Male) Treating RN: Ahmed Prima Primary Care Physician: [facility], [place]ossie Der Other Clinician: Referring Physician: [facility], [place] Der Treating Physician/Extender: Frann Rider in Treatment: 4 Active Problems Location of Pain Severity and Description of Pain Patient Has Paino No Site Locations With Dressing Change: No Pain Management and Medication Current Pain Management: Electronic Signature(s) Signed: 10/18/2015 5:53:23 PM By: Alric Quan Entered By: Alric Quan on 10/18/2015 08:51:38 [name] ([id number]) -------------------------------------------------------------------------------- Patient/Caregiver Education Details Patient Name: [name] Date of Service: 10/18/2015 8:45 AM Medical Record Number: [id number] Patient Account Number: [id number] Date of Birth/Gender: 05/10/1940 (75 y.o. Male) Treating RN: Ahmed Prima Primary Care Physician: [facility], [place]ie Der Other Clinician: Referring Physician:  [facility], [facility] Treating Physician/Extender: Frann Rider in Treatment: 4 Education Assessment Education Provided  To: Patient Education Topics Provided Wound/Skin Impairment: Handouts: Other: change dressing as ordered Methods: Demonstration, Explain/Verbal Responses: State content correctly Electronic Signature(s) Signed: 10/18/2015 5:53:23 PM By: Alric Quan Entered By: Alric Quan on 10/18/2015 09:12:38 [name] ([id number]) -------------------------------------------------------------------------------- Wound Assessment Details Patient Name: [name] Date of Service: 10/18/2015 8:45 AM Medical Record Number: [id number] Patient Account Number: [id number] Date of Birth/Sex: [birth year] (75 y.o. Male) Treating RN: Ahmed Prima Primary Care Physician: [facility], [place]ie Der Other Clinician: Referring Physician: [facility], [place] Der Treating Physician/Extender: Frann Rider in Treatment: 4 Wound Status Wound Number: 1 Primary Trauma, Other Etiology: Wound Location: Right Lower Leg - Lateral Wound Open Wounding Event: Trauma Status: Date Acquired: 08/07/2015 Comorbid Glaucoma, Sleep Apnea, Coronary Weeks Of Treatment: 4 History: Artery Disease, Hypertension, Type II Clustered Wound: No Diabetes, Osteoarthritis Photos Photo Uploaded By: Alric Quan on 10/18/2015 17:11:34 Wound Measurements Length: (cm) 2.5 % Reduction in Width: (cm) 1.5 % Reduction in Depth: (cm) 0.5 Epithelializat Area: (cm) 2.945 Tunneling: Volume: (cm) 1.473 Undermining: Starting Po Ending Posi Maximum Dis Area: 25% Volume: 46.4% ion: None No Yes sition (o'clock): 3 tion (o'clock): 6 tance: (cm) 3 Wound Description Full Thickness With Exposed Classification: Support Structures Diabetic Severity Grade 1 (Wagner): Wound Margin: Thickened Exudate Amount: Large Exudate Type: Serosanguineous AMBROSIO, STALLCUP ([id number]) Foul Odor After Cleansing: No Exudate Color: red, brown Wound Bed Granulation Amount: Medium (34-66%) Exposed Structure Necrotic Amount: Medium  (34-66%) Fascia Exposed: No Necrotic Quality: Adherent Slough Fat Layer Exposed: No Tendon Exposed: No Muscle Exposed: No Joint Exposed: No Bone Exposed: No Limited to Skin Breakdown Periwound Skin Texture Texture Color No Abnormalities Noted: No No Abnormalities Noted: No Localized Edema: Yes Temperature / Pain Moisture Temperature: No Abnormality No Abnormalities Noted: No Tenderness on Palpation: Yes Moist: Yes Wound Preparation Ulcer Cleansing: Rinsed/Irrigated with Saline Topical Anesthetic Applied: Other: lidocaine 4%, Treatment Notes Wound #1 (Right, Lateral Lower Leg) 1. Cleansed with: Clean wound with Normal Saline 2. Anesthetic Topical Lidocaine 4% cream to wound bed prior to debridement 3. Peri-wound Care: Skin Prep 4. Dressing Applied: Aquacel Ag 5. Secondary Dressing Applied Bordered Foam Dressing Electronic Signature(s) Signed: 10/18/2015 5:53:23 PM By: Alric Quan Entered By: Alric Quan on 10/18/2015 08:58:54 [name] ([id number]) -------------------------------------------------------------------------------- [place] Details Patient Name: [name] Date of Service: 10/18/2015 8:45 AM Medical Record Number: [id number] Patient Account Number: [id number] Date of Birth/Sex: [birth year] (75 y.o. Male) Treating RN: Ahmed Prima Primary Care Physician: [facility], [place] Other Clinician: Referring Physician: [facility], [facility] Treating Physician/Extender: Frann Rider in Treatment: 4 Vital Signs Time Taken: 08:51 Temperature (F): 97.9 Height (in): 74 Pulse (bpm): 61 Weight (lbs): 265 Respiratory Rate (breaths/min): 18 Body Mass Index (BMI): 34 Blood Pressure (mmHg): 128/80 Reference Range: 80 - 120 mg / dl Electronic Signature(s) Signed: 10/18/2015 5:53:23 PM By: Alric Quan Entered By: Alric Quan on 10/18/2015 08:53:43

## 2015-10-20 DIAGNOSIS — H2513 Age-related nuclear cataract, bilateral: Secondary | ICD-10-CM | POA: Diagnosis not present

## 2015-10-25 ENCOUNTER — Encounter: Payer: Medicare Other | Attending: Surgery | Admitting: Surgery

## 2015-10-25 DIAGNOSIS — E11622 Type 2 diabetes mellitus with other skin ulcer: Secondary | ICD-10-CM | POA: Insufficient documentation

## 2015-10-25 DIAGNOSIS — G4730 Sleep apnea, unspecified: Secondary | ICD-10-CM | POA: Insufficient documentation

## 2015-10-25 DIAGNOSIS — I10 Essential (primary) hypertension: Secondary | ICD-10-CM | POA: Diagnosis not present

## 2015-10-25 DIAGNOSIS — F329 Major depressive disorder, single episode, unspecified: Secondary | ICD-10-CM | POA: Insufficient documentation

## 2015-10-25 DIAGNOSIS — M70861 Other soft tissue disorders related to use, overuse and pressure, right lower leg: Secondary | ICD-10-CM | POA: Diagnosis not present

## 2015-10-25 DIAGNOSIS — L97213 Non-pressure chronic ulcer of right calf with necrosis of muscle: Secondary | ICD-10-CM | POA: Diagnosis not present

## 2015-10-25 DIAGNOSIS — K219 Gastro-esophageal reflux disease without esophagitis: Secondary | ICD-10-CM | POA: Diagnosis not present

## 2015-10-25 DIAGNOSIS — N400 Enlarged prostate without lower urinary tract symptoms: Secondary | ICD-10-CM | POA: Insufficient documentation

## 2015-10-25 DIAGNOSIS — M1990 Unspecified osteoarthritis, unspecified site: Secondary | ICD-10-CM | POA: Insufficient documentation

## 2015-10-25 DIAGNOSIS — S81801A Unspecified open wound, right lower leg, initial encounter: Secondary | ICD-10-CM | POA: Diagnosis not present

## 2015-10-25 DIAGNOSIS — Z87891 Personal history of nicotine dependence: Secondary | ICD-10-CM | POA: Diagnosis not present

## 2015-10-25 DIAGNOSIS — I2510 Atherosclerotic heart disease of native coronary artery without angina pectoris: Secondary | ICD-10-CM | POA: Insufficient documentation

## 2015-10-25 NOTE — Progress Notes (Signed)
Seth Mcintosh ([id number]) Visit Report for 10/25/2015 Chief Complaint Document Details Patient Name: Seth Mcintosh, Seth Mcintosh Date of Service: 10/25/2015 8:45 AM Medical Record Number: [id number] Patient Account Number: [id number] Date of Birth/Sex: [birth year] (75 y.o. Male) Treating RN: Ahmed Prima Primary Care Physician: [facility], Dossie Der Other Clinician: Referring Physician: Keith Rake Treating Physician/Extender: Frann Rider in Treatment: 5 Information Obtained from: Patient Chief Complaint Patient seen for complaints of Non-Healing Wound which she's had on the right lower extremity for about 5 weeks Electronic Signature(s) Signed: 10/25/2015 8:53:43 AM By: Christin Fudge MD, FACS Entered By: Christin Fudge on 10/25/2015 08:53:43 Seth Mcintosh ([id number]) -------------------------------------------------------------------------------- HPI Details Patient Name: Seth Mcintosh Date of Service: 10/25/2015 8:45 AM Medical Record Number: [id number] Patient Account Number: [id number] Date of Birth/Sex: October 02, 1940 (75 y.o. Male) Treating RN: Ahmed Prima Primary Care Physician: [facility], Dossie Der Other Clinician: Referring Physician: [facility], Dossie Der Treating Physician/Extender: Frann Rider in Treatment: 5 History of Present Illness Location: open wound right lower extremity lateral leg Quality: Patient reports experiencing a dull pain to affected area(s). Severity: Patient states wound are getting worse. Duration: Patient has had the wound for > 6 weeks prior to seeking treatment at the wound center Timing: Pain in wound is Intermittent (comes and goes Context: The wound occurred when the patient had a injury with a blunt object around 08/07/2015 Modifying Factors: a large swelling with drainage Associated Signs and Symptoms: Patient reports having increase swelling. HPI Description: 75 year old gentleman with known history of diabetes mellitus without complication had a lacerated  wound to his right lower extremity which was repaired about 4 weeks ago. Was seen by his PCP Dr. Donneta Romberg with a open wound on the right lateral leg with some discharge and erythema and he was placed on a blocks and local dressing was applied and referred to [place] for further care. Past medical history significant for diabetes mellitus type 2, coronary artery disease, hypertension, arthritis, depression, foot surgery o2, back surgery, CABG, TURP, right total knee replacement. He is a former smoker and quit in 1994. On questioning the patient he gives a history that he had a blunt injury to his right leg and this happened on April 15 of this year and about 2 weeks later he had blood draining from here and had to go to the ER and have sutures placed. However on looking at his medical records he was in the ER on 05/17/2015 with a blunt injury on his right lower extremity and at that stage x-ray showed no fracture and the right lower leg ultrasound showed no evidence of DVT. Diagnosis of a hematoma was made and he was given symptomatic treatment for this. The next time he was seen in the emergency room was on 08/11/2015 when the patient had a 10 cm laceration to the right lateral leg and this was sutured by the ER physician Dr. Dahlia Client. His blood alcohol level was 162 mg percent that day and the sutures were to be removed in 10 days' time. 09/23/2015 -- had his ultrasound of the left lower extremity for possible hematoma and the impression was there was no discrete drainable hematoma in the area of clinical concern on the left lower extremity. On reviewing his history he clearly states that he never had a large lacerated wound due to the injury or fall on April 19. He says he did have a few drinks but has not drunk and the hematoma which he had for a couple of months just burst open and caused  the big bleed and extrusion of clots. Electronic Signature(s) Signed: 10/25/2015 8:53:47 AM By: Christin Fudge MD,  FACS Entered By: Christin Fudge on 10/25/2015 08:53:47 Seth Mcintosh ([id number]) -------------------------------------------------------------------------------- Physical Exam Details Patient Name: Seth Mcintosh Date of Service: 10/25/2015 8:45 AM Medical Record Number: [id number] Patient Account Number: [id number] Date of Birth/Sex: 1940/12/17 (75 y.o. Male) Treating RN: Ahmed Prima Primary Care Physician: [facility], Dossie Der Other Clinician: Referring Physician: [facility], [facility] Treating Physician/Extender: Frann Rider in Treatment: 5 Constitutional . Pulse regular. Respirations normal and unlabored. Afebrile. . Eyes Nonicteric. Reactive to light. Ears, Nose, Mouth, and Throat Lips, teeth, and gums WNL.Marland Kitchen Moist mucosa without lesions. Neck supple and nontender. No palpable supraclavicular or cervical adenopathy. Normal sized without goiter. Respiratory WNL. No retractions.. Cardiovascular Pedal Pulses WNL. No clubbing, cyanosis or edema. Lymphatic No adneopathy. No adenopathy. No adenopathy. Musculoskeletal Adexa without tenderness or enlargement.. Digits and nails w/o clubbing, cyanosis, infection, petechiae, ischemia, or inflammatory conditions.. Integumentary (Hair, Skin) No suspicious lesions. No crepitus or fluctuance. No peri-wound warmth or erythema. No masses.Marland Kitchen Psychiatric Judgement and insight Intact.. No evidence of depression, anxiety, or agitation.. Notes there is minimal subcutaneous debris and with a Q-tip and moist saline gauze are been able to clean out the undermined area and is minimal serous fluid and no purulence. No surrounding cellulitis. Electronic Signature(s) Signed: 10/25/2015 8:54:18 AM By: Christin Fudge MD, FACS Entered By: Christin Fudge on 10/25/2015 08:54:18 Seth Mcintosh ([id number]) -------------------------------------------------------------------------------- Physician Orders Details Patient Name: Seth Mcintosh Date of Service:  10/25/2015 8:45 AM Medical Record Number: [id number] Patient Account Number: [id number] Date of Birth/Sex: [birth year] (75 y.o. Male) Treating RN: Ahmed Prima Primary Care Physician: [facility], Dossie Der Other Clinician: Referring Physician: [facility], [facility] Treating Physician/Extender: Frann Rider in Treatment: 5 Verbal / Phone Orders: Yes ClinicianCarolyne Fiscal, Debi Read Back and Verified: Yes Diagnosis Coding Wound Cleansing Wound #1 Right,Lateral Lower Leg o Cleanse wound with mild soap and water o May Shower, gently pat wound dry prior to applying new dressing. Anesthetic Wound #1 Right,Lateral Lower Leg o Topical Lidocaine 4% cream applied to wound bed prior to debridement Skin Barriers/Peri-Wound Care Wound #1 Right,Lateral Lower Leg o Skin Prep Primary Wound Dressing Wound #1 Right,Lateral Lower Leg o Aquacel Ag - rope Secondary Dressing Wound #1 Right,Lateral Lower Leg o Boardered Foam Dressing Dressing Change Frequency Wound #1 Right,Lateral Lower Leg o Change dressing every day. Edema Control Wound #1 Right,Lateral Lower Leg o Elevate legs to the level of the heart and pump ankles as often as possible o Support Garment 20-30 mm/Hg pressure to: - wear compression stockings Additional Orders / Instructions Wound #1 Right,Lateral Lower Leg o Increase protein intake. RASON, PASLEY ([id number]) Electronic Signature(s) Signed: 10/25/2015 4:11:02 PM By: Christin Fudge MD, FACS Signed: 10/25/2015 4:51:59 PM By: Alric Quan Entered By: Alric Quan on 10/25/2015 08:51:24 Seth Mcintosh ([id number]) -------------------------------------------------------------------------------- Problem List Details Patient Name: Seth Mcintosh Date of Service: 10/25/2015 8:45 AM Medical Record Number: [id number] Patient Account Number: [id number] Date of Birth/Sex: [birth year] (75 y.o. 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 Male) Treating RN: Ahmed Prima Primary Care Physician: [facility], Dossie Der Other Clinician: Referring Physician: [facility], [facility] Treating Physician/Extender: Frann Rider in Treatment: 5 Subjective Chief Complaint Information obtained from Patient Patient seen for complaints of Non-Healing Wound which she's had on the right lower extremity for about 5 weeks History of Present Illness (HPI) The following HPI elements were documented for the patient's wound: Location: open wound right lower extremity lateral leg Quality: Patient reports experiencing a dull pain to affected area(s). Severity: Patient states wound are getting worse. Duration: Patient has had the wound for > 6 weeks prior to seeking treatment at the wound center Timing: Pain in wound is Intermittent (comes and goes Context: The wound occurred when the patient had a injury with a blunt object around 08/07/2015 Modifying Factors: a large swelling with drainage Associated Signs and Symptoms: Patient reports having increase  swelling. 75 year old gentleman with known history of diabetes mellitus without complication had a lacerated wound to his right lower extremity which was repaired about 4 weeks ago. Was seen by his PCP Dr. Donneta Romberg with a open wound on the right lateral leg with some discharge and erythema and he was placed on a blocks and local dressing was applied and referred to [place] for further care. Past medical history significant for diabetes mellitus type 2, coronary artery disease, hypertension, arthritis, depression, foot surgery o2, back surgery, CABG, TURP, right total knee replacement. He is a former smoker and quit in 1994. On questioning the patient he gives a history that he had a blunt injury to his right leg and this happened on April 15 of this year and about 2 weeks later he had blood draining from here and had to go to the ER and have sutures placed. However on looking at his medical records he was in the ER on 05/17/2015 with a blunt injury on his right lower extremity and at that stage x-ray showed no fracture and the right lower leg ultrasound showed no evidence of DVT. Diagnosis of a hematoma was made and he was given symptomatic treatment for this. The next time he was seen in the emergency room was on 08/11/2015 when the patient had a 10 cm laceration to the right lateral leg and this was sutured by the ER physician Dr. Dahlia Client. His blood alcohol level was 162 mg percent that day and the sutures were to be removed in 10 days' time. 09/23/2015 -- had his ultrasound of the left lower extremity for possible hematoma and the impression was there was no discrete drainable hematoma in the area of clinical concern on the left lower extremity. On reviewing his history he clearly states that he never had a large lacerated wound due to the injury or fall on April 19. He says he did have a few drinks but has not drunk and the hematoma which he had for a Seth Mcintosh, Seth Mcintosh. ([id number]) couple of  months just burst open and caused the big bleed and extrusion of clots. Objective Constitutional Pulse regular. Respirations normal and unlabored. Afebrile. Vitals Time Taken: 8:35 AM, Height: 74 in, Weight: 265 lbs, BMI: 34, Temperature: 97.8 F, Pulse: 63 bpm, Respiratory Rate: 18 breaths/min, Blood Pressure: 121/72 mmHg. Eyes Nonicteric. Reactive to  light. Ears, Nose, Mouth, and Throat Lips, teeth, and gums WNL.Marland Kitchen Moist mucosa without lesions. Neck supple and nontender. No palpable supraclavicular or cervical adenopathy. Normal sized without goiter. Respiratory WNL. No retractions.. Cardiovascular Pedal Pulses WNL. No clubbing, cyanosis or edema. Lymphatic No adneopathy. No adenopathy. No adenopathy. Musculoskeletal Adexa without tenderness or enlargement.. Digits and nails w/o clubbing, cyanosis, infection, petechiae, ischemia, or inflammatory conditions.Marland Kitchen Psychiatric Judgement and insight Intact.. No evidence of depression, anxiety, or agitation.. General Notes: there is minimal subcutaneous debris and with a Q-tip and moist saline gauze are been able to clean out the undermined area and is minimal serous fluid and no purulence. No surrounding cellulitis. Integumentary (Hair, Skin) No suspicious lesions. No crepitus or fluctuance. No peri-wound warmth or erythema. No masses.. Wound #1 status is Open. Original cause of wound was Trauma. The wound is located on the Right,Lateral Lower Leg. The wound measures 2.3cm length x 1.4cm width x 0.5cm depth; 2.529cm^2 area and Seth Mcintosh, Seth E. ([id number]) 1.264cm^3 volume. The wound is limited to skin breakdown. There is undermining starting at 1:00 and ending at 6:00 with a maximum distance of 3.3cm. There is a large amount of serosanguineous drainage noted. The wound margin is thickened. There is medium (34-66%) granulation within the wound bed. There is a medium (34-66%) amount of necrotic tissue within the wound bed including Adherent  Slough. The periwound skin appearance exhibited: Localized Edema, Moist. Periwound temperature was noted as No Abnormality. The periwound has tenderness on palpation. 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 Anesthetic: Wound #1 Right,Lateral Lower Leg: Topical Lidocaine 4% cream applied to wound bed prior to debridement Skin Barriers/Peri-Wound Care: Wound #1 Right,Lateral Lower Leg: Skin Prep Primary Wound Dressing: Wound #1 Right,Lateral Lower Leg: Aquacel Ag - rope Secondary Dressing: Wound #1 Right,Lateral Lower Leg: Boardered Foam Dressing Dressing Change Frequency: Wound #1 Right,Lateral Lower Leg: Change dressing every day. Edema Control: Wound #1 Right,Lateral Lower Leg: Elevate legs to the level of the heart and pump ankles as often as possible Support Garment 20-30 mm/Hg pressure to: - wear compression stockings Additional Orders / Instructions: Seth Mcintosh, Seth Mcintosh ([id number]) Wound #1 Right,Lateral Lower Leg: Increase protein intake. We will continue with light packing of silver alginate and continue local care. 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 Electronic Signature(s) Signed: 10/25/2015 8:55:09 AM By: Christin Fudge MD, FACS Entered By: Christin Fudge on 10/25/2015 08:55:09 Seth Mcintosh ([id number]) -------------------------------------------------------------------------------- [place] Details Patient Name: Seth Mcintosh Date of  Service: 10/25/2015 Medical Record Number: [id number] Patient Account Number: [id number] Date of Birth/Sex: [birth year] (75 y.o. Male) Treating RN: Ahmed Prima Primary Care Physician: [facility], Dossie Der Other Clinician: Referring Physician: [facility], Dossie Der Treating Physician/Extender: Frann Rider in Treatment: 5 Diagnosis Coding ICD-10 Codes Code Description E11.622 Type 2 diabetes mellitus with other skin ulcer L97.213 Non-pressure chronic ulcer of right calf with necrosis of muscle M70.861 Other soft tissue disorders related to use, overuse and pressure, right lower leg Facility Procedures CPT4 Code: [id number] Description: [id number] - WOUND  CARE VISIT-LEV 3 EST PT Modifier: Quantity: 1 Physician Procedures CPT4: Description Modifier Quantity Code [id number] - WC PHYS LEVEL 3 - EST PT 1 ICD-10 Description Diagnosis E11.622 Type 2 diabetes mellitus with other skin ulcer L97.213 Non-pressure chronic ulcer of right calf with necrosis of muscle M70.861 Other  soft tissue disorders related to use, overuse and pressure, right lower leg Electronic Signature(s) Signed: 10/25/2015 4:11:02 PM By: Christin Fudge MD, FACS Signed: 10/25/2015 4:51:59 PM By: Alric Quan Previous Signature: 10/25/2015 8:55:20 AM Version By: Christin Fudge MD, FACS Entered By: Alric Quan on 10/25/2015 09:15:12

## 2015-10-25 NOTE — Progress Notes (Signed)
Seth Mcintosh, Seth Mcintosh ([id number]) Visit Report for 10/25/2015 Arrival Information Details Patient Name: Seth Mcintosh, Seth Mcintosh Date of Service: 10/25/2015 8:45 AM Medical Record Number: [id number] Patient Account Number: [id number] Date of Birth/Sex: 10-28-1940 (75 y.o. Seth Mcintosh) Treating RN: Ahmed Prima Primary Care Physician: [facility], Dossie Der Other Clinician: Referring Physician: [facility], Dossie Der Treating Physician/Extender: Frann Rider in Treatment: 5 Visit Information History Since Last Visit All ordered tests and consults were completed: No Patient Arrived: Ambulatory Added or deleted any medications: No Arrival Time: 08:34 Any new allergies or adverse reactions: No Accompanied By: self Had a fall or experienced change in No Transfer Assistance: None activities of daily living that may affect Patient Identification Verified: Yes risk of falls: Secondary Verification Process Yes Signs or symptoms of abuse/neglect since last No Completed: visito Patient Requires Transmission-Based No Hospitalized since last visit: No Precautions: Pain Present Now: No Patient Has Alerts: Yes Patient Alerts: DM II Electronic Signature(s) Signed: 10/25/2015 4:51:59 PM By: Alric Quan Entered By: Alric Quan on 10/25/2015 08:34:40 Seth Mcintosh ([id number]) -------------------------------------------------------------------------------- Clinic Level of Care Assessment Details Patient Name: Seth Mcintosh Date of Service: 10/25/2015 8:45 AM Medical Record Number: [id number] Patient Account Number: [id number] Date of Birth/Sex: [birth year] (75 y.o. Seth Mcintosh) Treating RN: Ahmed Prima Primary Care Physician: [facility], Dossie Der Other Clinician: Referring Physician: [facility], [facility] Treating Physician/Extender: Frann Rider in Treatment: 5 Clinic Level of Care Assessment Items TOOL 4 Quantity Score X - Use when only an EandM is performed on FOLLOW-UP visit 1 0 ASSESSMENTS - Nursing Assessment /  Reassessment X - Reassessment of Co-morbidities (includes updates in patient status) 1 10 X - Reassessment of Adherence to Treatment Plan 1 5 ASSESSMENTS - Wound and Skin Assessment / Reassessment X - Simple Wound Assessment / Reassessment - one wound 1 5 []  - Complex Wound Assessment / Reassessment - multiple wounds 0 []  - Dermatologic / Skin Assessment (not related to wound area) 0 ASSESSMENTS - Focused Assessment []  - Circumferential Edema Measurements - multi extremities 0 []  - Nutritional Assessment / Counseling / Intervention 0 []  - Lower Extremity Assessment (monofilament, tuning fork, pulses) 0 []  - Peripheral Arterial Disease Assessment (using hand held doppler) 0 ASSESSMENTS - Ostomy and/or Continence Assessment and Care []  - Incontinence Assessment and Management 0 []  - Ostomy Care Assessment and Management (repouching, etc.) 0 PROCESS - Coordination of Care X - Simple Patient / Family Education for ongoing care 1 15 []  - Complex (extensive) Patient / Family Education for ongoing care 0 []  - Staff obtains Programmer, systems, Records, Test Results / Process Orders 0 []  - Staff telephones HHA, Nursing Homes / Clarify orders / etc 0 []  - Routine Transfer to another Facility (non-emergent condition) 0 Seth Mcintosh, Seth Mcintosh ([id number]) []  - Routine Hospital Admission (non-emergent condition) 0 []  - New Admissions / Biomedical engineer / Ordering NPWT, Apligraf, etc. 0 []  - Emergency Hospital Admission (emergent condition) 0 X - Simple Discharge Coordination 1 10 []  - Complex (extensive) Discharge Coordination 0 PROCESS - Special Needs []  - Pediatric / Minor Patient Management 0 []  - Isolation Patient Management 0 []  - Hearing / Language / Visual special needs 0 []  - Assessment of Community assistance (transportation, D/C planning, etc.) 0 []  - Additional assistance / Altered mentation 0 []  - Support Surface(s) Assessment (bed, cushion, seat, etc.) 0 INTERVENTIONS - Wound Cleansing /  Measurement X - Simple Wound Cleansing - one wound 1 5 []  - Complex Wound Cleansing - multiple wounds 0 X - Wound Imaging (photographs - any number of  wounds) 1 5 []  - Wound Tracing (instead of photographs) 0 []  - Simple Wound Measurement - one wound 0 X - Complex Wound Measurement - multiple wounds 1 5 INTERVENTIONS - Wound Dressings []  - Small Wound Dressing one or multiple wounds 0 X - Medium Wound Dressing one or multiple wounds 1 15 []  - Large Wound Dressing one or multiple wounds 0 []  - Application of Medications - topical 0 []  - Application of Medications - injection 0 INTERVENTIONS - Miscellaneous []  - External ear exam 0 Seth Mcintosh, Seth Mcintosh ([id number]) []  - Specimen Collection (cultures, biopsies, blood, body fluids, etc.) 0 []  - Specimen(s) / Culture(s) sent or taken to Lab for analysis 0 []  - Patient Transfer (multiple staff / Harrel Lemon Lift / Similar devices) 0 []  - Simple Staple / Suture removal (25 or less) 0 []  - Complex Staple / Suture removal (26 or more) 0 []  - Hypo / Hyperglycemic Management (close monitor of Blood Glucose) 0 []  - Ankle / Brachial Index (ABI) - do not check if billed separately 0 X - Vital Signs 1 5 Has the patient been seen at the hospital within the last three years: Yes Total Score: 80 Level Of Care: New/Established - Level 3 Electronic Signature(s) Signed: 10/25/2015 4:51:59 PM By: Alric Quan Entered By: Alric Quan on 10/25/2015 09:15:04 Seth Mcintosh ([id number]) -------------------------------------------------------------------------------- Encounter Discharge Information Details Patient Name: Seth Mcintosh Date of Service: 10/25/2015 8:45 AM Medical Record Number: [id number] Patient Account Number: [id number] Date of Birth/Sex: Feb 02, 1941 (75 y.o. Seth Mcintosh) Treating RN: Ahmed Prima Primary Care Physician: [facility], Dossie Der Other Clinician: Referring Physician: [facility], [facility] Treating Physician/Extender: Frann Rider in  Treatment: 5 Encounter Discharge Information Items Discharge Pain Level: 0 Discharge Condition: Stable Ambulatory Status: Ambulatory Discharge Destination: Home Transportation: Private Auto Accompanied By: self Schedule Follow-up Appointment: Yes Medication Reconciliation completed and provided to Patient/Care Yes [place]: Provided on Clinical Summary of Care: 10/25/2015 Form Type Recipient Paper Patient JK Electronic Signature(s) Signed: 10/25/2015 9:00:22 AM By: Ruthine Dose Entered By: Ruthine Dose on 10/25/2015 09:00:22 Seth Mcintosh ([id number]) -------------------------------------------------------------------------------- Lower Extremity Assessment Details Patient Name: Seth Mcintosh Date of Service: 10/25/2015 8:45 AM Medical Record Number: [id number] Patient Account Number: [id number] Date of Birth/Sex: 1940/11/12 (75 y.o. Seth Mcintosh) Treating RN: Ahmed Prima Primary Care Physician: [facility], [place]ossie Der Other Clinician: Referring Physician: [facility], [facility] Treating Physician/Extender: Frann Rider in Treatment: 5 Vascular Assessment Pulses: Posterior Tibial Dorsalis Pedis Palpable: [Right:Yes] Extremity colors, hair growth, and conditions: Extremity Color: [Right:Mottled] Temperature of Extremity: [Right:Warm] Capillary Refill: [Right:< 3 seconds] Toe Nail Assessment Left: Right: Thick: No Discolored: No Deformed: No Improper Length and Hygiene: No Electronic Signature(s) Signed: 10/25/2015 4:51:59 PM By: Alric Quan Entered By: Alric Quan on 10/25/2015 08:38:03 Seth Mcintosh ([id number]) -------------------------------------------------------------------------------- Multi Wound Chart Details Patient Name: Seth Mcintosh Date of Service: 10/25/2015 8:45 AM Medical Record Number: [id number] Patient Account Number: [id number] Date of Birth/Sex: [birth year] (75 y.o. Seth Mcintosh) Treating RN: Ahmed Prima Primary Care Physician: [facility], Dossie Der Other  Clinician: Referring Physician: [facility], [facility] Treating Physician/Extender: Frann Rider in Treatment: 5 Vital Signs Height(in): 74 Pulse(bpm): 63 Weight(lbs): 265 Blood Pressure 121/72 (mmHg): Body Mass Index(BMI): 34 Temperature(F): 97.8 Respiratory Rate 18 (breaths/min): Photos: [1:No Photos] [N/A:N/A] Wound Location: [1:Right Lower Leg - Lateral] [N/A:N/A] Wounding Event: [1:Trauma] [N/A:N/A] Primary Etiology: [1:Trauma, Other] [N/A:N/A] Comorbid History: [1:Glaucoma, Sleep Apnea, Coronary Artery Disease, Hypertension, Type II Diabetes, Osteoarthritis] [N/A:N/A] Date Acquired: [1:08/07/2015] [N/A:N/A] Weeks of Treatment: [1:5] [N/A:N/A] Wound Status: [1:Open] [N/A:N/A] Measurements L x W  x D 2.3x1.4x0.5 [N/A:N/A] (cm) Area (cm) : [1:2.529] [N/A:N/A] Volume (cm) : [1:1.264] [N/A:N/A] % Reduction in Area: [1:35.60%] [N/A:N/A] % Reduction in Volume: 54.00% [N/A:N/A] Starting Position 1 1 (o'clock): Ending Position 1 [1:6] (o'clock): Maximum Distance 1 3.3 (cm): Undermining: [1:Yes] [N/A:N/A] Classification: [1:Full Thickness With Exposed Support Structures] [N/A:N/A] HBO Classification: [1:Grade 1] [N/A:N/A] Exudate Amount: [1:Large] [N/A:N/A] Exudate Type: [1:Serosanguineous] [N/A:N/A] Exudate Color: red, brown N/A N/A Wound Margin: Thickened N/A N/A Granulation Amount: Medium (34-66%) N/A N/A Necrotic Amount: Medium (34-66%) N/A N/A Exposed Structures: Fascia: No N/A N/A Fat: No Tendon: No Muscle: No Joint: No Bone: No Limited to Skin Breakdown Epithelialization: None N/A N/A Periwound Skin Texture: Edema: Yes N/A N/A Periwound Skin Moist: Yes N/A N/A Moisture: Periwound Skin Color: No Abnormalities Noted N/A N/A Temperature: No Abnormality N/A N/A Tenderness on Yes N/A N/A Palpation: Wound Preparation: Ulcer Cleansing: N/A N/A Rinsed/Irrigated with Saline Topical Anesthetic Applied: Other: lidocaine 4% Treatment Notes Electronic  Signature(s) Signed: 10/25/2015 4:51:59 PM By: Alric Quan Entered By: Alric Quan on 10/25/2015 08:42:13 Seth Mcintosh ([id number]) -------------------------------------------------------------------------------- Multi-Disciplinary Care Plan Details Patient Name: Seth Mcintosh Date of Service: 10/25/2015 8:45 AM Medical Record Number: [id number] Patient Account Number: [id number] Date of Birth/Sex: [birth year] (75 y.o. Seth Mcintosh) Treating RN: Ahmed Prima Primary Care Physician: [facility], Dossie Der Other Clinician: Referring Physician: [facility], Dossie Der Treating Physician/Extender: Frann Rider in Treatment: 5 Active Inactive Abuse / Safety / Falls / Self Care Management Nursing Diagnoses: Potential for falls Goals: Patient will remain injury free Date Initiated: 09/16/2015 Goal Status: Active Interventions: Assess fall risk on admission and as needed Notes: Orientation to the Wound Care Program Nursing Diagnoses: Knowledge deficit related to the wound healing center program Goals: Patient/caregiver will verbalize understanding of the [place] Program Date Initiated: 09/16/2015 Goal Status: Active Interventions: Provide education on orientation to the wound center Notes: Pain, Acute or Chronic Nursing Diagnoses: Pain, acute or chronic: actual or potential Potential alteration in comfort, pain Goals: Seth Mcintosh, Seth Mcintosh ([id number]) Patient will verbalize adequate pain control and receive pain control interventions during procedures as needed Date Initiated: 09/16/2015 Goal Status: Active Interventions: Assess comfort goal upon admission Complete pain assessment as per visit requirements Notes: Soft Tissue Infection Nursing Diagnoses: Impaired tissue integrity Goals: Patient will remain free of wound infection Date Initiated: 09/16/2015 Goal Status: Active Interventions: Assess signs and symptoms of infection every visit Notes: Wound/Skin  Impairment Nursing Diagnoses: Impaired tissue integrity Goals: Ulcer/skin breakdown will have a volume reduction of 30% by week 4 Date Initiated: 09/16/2015 Goal Status: Active Ulcer/skin breakdown will have a volume reduction of 50% by week 8 Date Initiated: 09/16/2015 Goal Status: Active Ulcer/skin breakdown will have a volume reduction of 80% by week 12 Date Initiated: 09/16/2015 Goal Status: Active Interventions: Assess patient/caregiver ability to obtain necessary supplies Assess ulceration(s) every visit Notes: Seth Mcintosh, Seth Mcintosh ([id number]) Electronic Signature(s) Signed: 10/25/2015 4:51:59 PM By: Alric Quan Entered By: Alric Quan on 10/25/2015 08:42:08 Seth Mcintosh ([id number]) -------------------------------------------------------------------------------- Pain Assessment Details Patient Name: Seth Mcintosh Date of Service: 10/25/2015 8:45 AM Medical Record Number: [id number] Patient Account Number: [id number] Date of Birth/Sex: Feb 22, 1941 (75 y.o. Seth Mcintosh) Treating RN: Ahmed Prima Primary Care Physician: [facility], Dossie Der Other Clinician: Referring Physician: Keith Rake Treating Physician/Extender: Frann Rider in Treatment: 5 Active Problems Location of Pain Severity and Description of Pain Patient Has Paino No Site Locations With Dressing Change: No Pain Management and Medication Current Pain Management: Electronic Signature(s) Signed: 10/25/2015 4:51:59 PM By: Alric Quan Entered  By: Alric Quan on 10/25/2015 08:34:46 Seth Mcintosh ([id number]) -------------------------------------------------------------------------------- Patient/Caregiver Education Details Patient Name: Seth Mcintosh Date of Service: 10/25/2015 8:45 AM Medical Record Number: [id number] Patient Account Number: [id number] Date of Birth/Gender: [birth year] (75 y.o. Seth Mcintosh) Treating RN: Ahmed Prima Primary Care Physician: [facility], Dossie Der Other Clinician: Referring  Physician: [facility], [facility] Treating Physician/Extender: Frann Rider in Treatment: 5 Education Assessment Education Provided To: Patient Education Topics Provided Wound/Skin Impairment: Handouts: Other: change dressing as ordered Methods: Demonstration, Explain/Verbal Responses: State content correctly Electronic Signature(s) Signed: 10/25/2015 4:51:59 PM By: Alric Quan Entered By: Alric Quan on 10/25/2015 08:52:23 Seth Mcintosh ([id number]) -------------------------------------------------------------------------------- Wound Assessment Details Patient Name: Seth Mcintosh Date of Service: 10/25/2015 8:45 AM Medical Record Number: [id number] Patient Account Number: [id number] Date of Birth/Sex: May 21, 1940 (75 y.o. Seth Mcintosh) Treating RN: Ahmed Prima Primary Care Physician: [facility], Dossie Der Other Clinician: Referring Physician: [facility], [facility] Treating Physician/Extender: Frann Rider in Treatment: 5 Wound Status Wound Number: 1 Primary Trauma, Other Etiology: Wound Location: Right Lower Leg - Lateral Wound Open Wounding Event: Trauma Status: Date Acquired: 08/07/2015 Comorbid Glaucoma, Sleep Apnea, Coronary Weeks Of Treatment: 5 History: Artery Disease, Hypertension, Type II Clustered Wound: No Diabetes, Osteoarthritis Photos Photo Uploaded By: Alric Quan on 10/25/2015 11:48:39 Wound Measurements Length: (cm) 2.3 % Reduction in Width: (cm) 1.4 % Reduction in Depth: (cm) 0.5 Epithelializat Area: (cm) 2.529 Undermining: Volume: (cm) 1.264 Starting P Ending Posi Maximum Dis Area: 35.6% Volume: 54% ion: None Yes osition (o'clock): 1 tion (o'clock): 6 tance: (cm) 3.3 Wound Description Full Thickness With Exposed Classification: Support Structures Diabetic Severity Grade 1 (Wagner): Wound Margin: Thickened Exudate Amount: Large Exudate Type: Serosanguineous Exudate Color: red, brown TOBIUS, SONI ([id number]) Foul Odor After  Cleansing: No Wound Bed Granulation Amount: Medium (34-66%) Exposed Structure Necrotic Amount: Medium (34-66%) Fascia Exposed: No Necrotic Quality: Adherent Slough Fat Layer Exposed: No Tendon Exposed: No Muscle Exposed: No Joint Exposed: No Bone Exposed: No Limited to Skin Breakdown Periwound Skin Texture Texture Color No Abnormalities Noted: No No Abnormalities Noted: No Localized Edema: Yes Temperature / Pain Moisture Temperature: No Abnormality No Abnormalities Noted: No Tenderness on Palpation: Yes Moist: Yes Wound Preparation Ulcer Cleansing: Rinsed/Irrigated with Saline Topical Anesthetic Applied: Other: lidocaine 4%, Treatment Notes Wound #1 (Right, Lateral Lower Leg) 1. Cleansed with: Clean wound with Normal Saline 2. Anesthetic Topical Lidocaine 4% cream to wound bed prior to debridement 3. Peri-wound Care: Skin Prep 4. Dressing Applied: Aquacel Ag 5. Secondary Dressing Applied Bordered Foam Dressing Electronic Signature(s) Signed: 10/25/2015 4:51:59 PM By: Alric Quan Entered By: Alric Quan on 10/25/2015 08:41:51 Seth Mcintosh ([id number]) -------------------------------------------------------------------------------- [place] Details Patient Name: Seth Mcintosh Date of Service: 10/25/2015 8:45 AM Medical Record Number: [id number] Patient Account Number: [id number] Date of Birth/Sex: 08-21-1940 (75 y.o. Seth Mcintosh) Treating RN: Ahmed Prima Primary Care Physician: [facility], [place] Other Clinician: Referring Physician: [facility], [facility] Treating Physician/Extender: Frann Rider in Treatment: 5 Vital Signs Time Taken: 08:35 Temperature (F): 97.8 Height (in): 74 Pulse (bpm): 63 Weight (lbs): 265 Respiratory Rate (breaths/min): 18 Body Mass Index (BMI): 34 Blood Pressure (mmHg): 121/72 Reference Range: 80 - 120 mg / dl Electronic Signature(s) Signed: 10/25/2015 4:51:59 PM By: Alric Quan Entered By: Alric Quan on 10/25/2015  [id number]

## 2015-11-01 ENCOUNTER — Encounter (HOSPITAL_BASED_OUTPATIENT_CLINIC_OR_DEPARTMENT_OTHER): Payer: Medicare Other | Admitting: General Surgery

## 2015-11-01 DIAGNOSIS — L089 Local infection of the skin and subcutaneous tissue, unspecified: Secondary | ICD-10-CM | POA: Diagnosis not present

## 2015-11-01 DIAGNOSIS — I10 Essential (primary) hypertension: Secondary | ICD-10-CM | POA: Diagnosis not present

## 2015-11-01 DIAGNOSIS — L97213 Non-pressure chronic ulcer of right calf with necrosis of muscle: Secondary | ICD-10-CM

## 2015-11-01 DIAGNOSIS — M70861 Other soft tissue disorders related to use, overuse and pressure, right lower leg: Secondary | ICD-10-CM | POA: Diagnosis not present

## 2015-11-01 DIAGNOSIS — S81801D Unspecified open wound, right lower leg, subsequent encounter: Secondary | ICD-10-CM

## 2015-11-01 DIAGNOSIS — Z87891 Personal history of nicotine dependence: Secondary | ICD-10-CM | POA: Diagnosis not present

## 2015-11-01 DIAGNOSIS — I2510 Atherosclerotic heart disease of native coronary artery without angina pectoris: Secondary | ICD-10-CM | POA: Diagnosis not present

## 2015-11-01 DIAGNOSIS — E11622 Type 2 diabetes mellitus with other skin ulcer: Secondary | ICD-10-CM | POA: Diagnosis not present

## 2015-11-01 NOTE — Progress Notes (Signed)
Debrided ulcer with curette.  Ulcer 1 cm Rx with Santyl daily

## 2015-11-02 NOTE — Progress Notes (Signed)
HIMMAT, PROPPS ([id number]) Visit Report for 11/01/2015 Arrival Information Details Patient Name: Seth Mcintosh, Seth Mcintosh Date of Service: 11/01/2015 2:15 PM Medical Record Number: [id number] Patient Account Number: [id number] Date of Birth/Sex: [birth year] (75 y.o. Male) Treating RN: Montey Hora Primary Care Physician: [facility], Dossie Der Other Clinician: Referring Physician: [facility], [facility] Treating Physician/Extender: Benjaman Pott in Treatment: 6 Visit Information History Since Last Visit Added or deleted any medications: No Patient Arrived: Ambulatory Any new allergies or adverse reactions: No Arrival Time: 14:17 Had a fall or experienced change in No Accompanied By: self activities of daily living that may affect Transfer Assistance: None risk of falls: Patient Identification Verified: Yes Signs or symptoms of abuse/neglect since last No Secondary Verification Process Yes visito Completed: Hospitalized since last visit: No Patient Requires Transmission-Based No Has Dressing in Place as Prescribed: Yes Precautions: Pain Present Now: No Patient Has Alerts: Yes Patient Alerts: DM II Electronic Signature(s) Signed: 11/01/2015 5:21:35 PM By: Montey Hora Entered By: Montey Hora on 11/01/2015 14:18:22 Seth Mcintosh ([id number]) -------------------------------------------------------------------------------- Encounter Discharge Information Details Patient Name: Seth Mcintosh Date of Service: 11/01/2015 2:15 PM Medical Record Number: [id number] Patient Account Number: [id number] Date of Birth/Sex: 22-Jul-1940 (75 y.o. Male) Treating RN: Montey Hora Primary Care Physician: [facility], Dossie Der Other Clinician: Referring Physician: [facility], [facility] Treating Physician/Extender: Benjaman Pott in Treatment: 6 Encounter Discharge Information Items Discharge Pain Level: 0 Discharge Condition: Stable Ambulatory Status: Ambulatory Discharge Destination: Home Transportation: Private  Auto Accompanied By: self Schedule Follow-up Appointment: Yes Medication Reconciliation completed and provided to Patient/Care No Flynn Lininger: Provided on Clinical Summary of Care: 11/01/2015 Form Type Recipient Paper Patient JK Electronic Signature(s) Signed: 11/01/2015 3:03:09 PM By: Judene Companion MD Previous Signature: 11/01/2015 2:48:59 PM Version By: Ruthine Dose Entered By: Judene Companion on 11/01/2015 15:03:09 Seth Mcintosh ([id number]) -------------------------------------------------------------------------------- Lower Extremity Assessment Details Patient Name: Seth Mcintosh Date of Service: 11/01/2015 2:15 PM Medical Record Number: [id number] Patient Account Number: [id number] Date of Birth/Sex: [birth year] (75 y.o. Male) Treating RN: Montey Hora Primary Care Physician: [facility], Dossie Der Other Clinician: Referring Physician: [facility], [facility] Treating Physician/Extender: Benjaman Pott in Treatment: 6 Edema Assessment Assessed: [Left: No] [Right: No] Edema: [Left: Ye] [Right: s] Vascular Assessment Pulses: Posterior Tibial Dorsalis Pedis Palpable: [Right:Yes] Extremity colors, hair growth, and conditions: Extremity Color: [Right:Normal] Hair Growth on Extremity: [Right:Yes] Temperature of Extremity: [Right:Warm] Capillary Refill: [Right:< 3 seconds] Electronic Signature(s) Signed: 11/01/2015 5:21:35 PM By: Montey Hora Entered By: Montey Hora on 11/01/2015 14:24:26 Seth Mcintosh ([id number]) -------------------------------------------------------------------------------- Multi Wound Chart Details Patient Name: Seth Mcintosh Date of Service: 11/01/2015 2:15 PM Medical Record Number: [id number] Patient Account Number: [id number] Date of Birth/Sex: March 28, 1941 (75 y.o. Male) Treating RN: Montey Hora Primary Care Physician: [facility], Dossie Der Other Clinician: Referring Physician: [facility], [facility] Treating Physician/Extender: Benjaman Pott in Treatment: 6 Vital  Signs Height(in): 74 Pulse(bpm): 60 Weight(lbs): 265 Blood Pressure 114/62 (mmHg): Body Mass Index(BMI): 34 Temperature(F): 98.0 Respiratory Rate 18 (breaths/min): Photos: [1:No Photos] [N/A:N/A] Wound Location: [1:Right Lower Leg - Lateral] [N/A:N/A] Wounding Event: [1:Trauma] [N/A:N/A] Primary Etiology: [1:Trauma, Other] [N/A:N/A] Comorbid History: [1:Glaucoma, Sleep Apnea, Coronary Artery Disease, Hypertension, Type II Diabetes, Osteoarthritis] [N/A:N/A] Date Acquired: [1:08/07/2015] [N/A:N/A] Weeks of Treatment: [1:6] [N/A:N/A] Wound Status: [1:Open] [N/A:N/A] Measurements L x W x D 2x1.2x0.5 [N/A:N/A] (cm) Area (cm) : [1:1.885] [N/A:N/A] Volume (cm) : [1:0.942] [N/A:N/A] % Reduction in Area: [1:52.00%] [N/A:N/A] % Reduction in Volume: 65.70% [N/A:N/A] Starting Position 1 3 (o'clock): Ending Position 1 [1:6] (o'clock): Maximum Distance 1 1.5 (cm): Undermining: [  1:Yes] [N/A:N/A] Classification: [1:Full Thickness With Exposed Support Structures] [N/A:N/A] HBO Classification: [1:Grade 1] [N/A:N/A] Exudate Amount: [1:Medium] [N/A:N/A] Exudate Type: [1:Serosanguineous] [N/A:N/A] Exudate Color: red, brown N/A N/A Wound Margin: Thickened N/A N/A Granulation Amount: Large (67-100%) N/A N/A Granulation Quality: Red N/A N/A Necrotic Amount: Small (1-33%) N/A N/A Exposed Structures: Fascia: No N/A N/A Fat: No Tendon: No Muscle: No Joint: No Bone: No Limited to Skin Breakdown Epithelialization: None N/A N/A Periwound Skin Texture: Edema: Yes N/A N/A Periwound Skin Moist: Yes N/A N/A Moisture: Periwound Skin Color: No Abnormalities Noted N/A N/A Temperature: No Abnormality N/A N/A Tenderness on Yes N/A N/A Palpation: Wound Preparation: Ulcer Cleansing: N/A N/A Rinsed/Irrigated with Saline Topical Anesthetic Applied: Other: lidocaine 4% Treatment Notes Electronic Signature(s) Signed: 11/01/2015 5:21:35 PM By: Montey Hora Entered By: Montey Hora on  11/01/2015 14:29:04 Seth Mcintosh ([id number]) -------------------------------------------------------------------------------- Multi-Disciplinary Care Plan Details Patient Name: Seth Mcintosh Date of Service: 11/01/2015 2:15 PM Medical Record Number: [id number] Patient Account Number: [id number] Date of Birth/Sex: [birth year] (75 y.o. Male) Treating RN: Montey Hora Primary Care Physician: [facility], Dossie Der Other Clinician: Referring Physician: [facility], [facility] Treating Physician/Extender: Benjaman Pott in Treatment: 6 Active Inactive Abuse / Safety / Falls / Self Care Management Nursing Diagnoses: Potential for falls Goals: Patient will remain injury free Date Initiated: 09/16/2015 Goal Status: Active Interventions: Assess fall risk on admission and as needed Notes: Orientation to the Wound Care Program Nursing Diagnoses: Knowledge deficit related to the wound healing center program Goals: Patient/caregiver will verbalize understanding of the [place] [facility] Date Initiated: 09/16/2015 Goal Status: Active Interventions: Provide education on orientation to the wound center Notes: Pain, Acute or Chronic Nursing Diagnoses: Pain, acute or chronic: actual or potential Potential alteration in comfort, pain Goals: Seth Mcintosh, Seth Mcintosh ([id number]) Patient will verbalize adequate pain control and receive pain control interventions during procedures as needed Date Initiated: 09/16/2015 Goal Status: Active Interventions: Assess comfort goal upon admission Complete pain assessment as per visit requirements Notes: Soft Tissue Infection Nursing Diagnoses: Impaired tissue integrity Goals: Patient will remain free of wound infection Date Initiated: 09/16/2015 Goal Status: Active Interventions: Assess signs and symptoms of infection every visit Notes: Wound/Skin Impairment Nursing Diagnoses: Impaired tissue integrity Goals: Ulcer/skin breakdown will have a volume  reduction of 30% by week 4 Date Initiated: 09/16/2015 Goal Status: Active Ulcer/skin breakdown will have a volume reduction of 50% by week 8 Date Initiated: 09/16/2015 Goal Status: Active Ulcer/skin breakdown will have a volume reduction of 80% by week 12 Date Initiated: 09/16/2015 Goal Status: Active Interventions: Assess patient/caregiver ability to obtain necessary supplies Assess ulceration(s) every visit Notes: Seth Mcintosh, Seth Mcintosh ([id number]) Electronic Signature(s) Signed: 11/01/2015 5:21:35 PM By: Montey Hora Entered By: Montey Hora on 11/01/2015 14:28:05 Seth Mcintosh ([id number]) -------------------------------------------------------------------------------- Pain Assessment Details Patient Name: Seth Mcintosh Date of Service: 11/01/2015 2:15 PM Medical Record Number: [id number] Patient Account Number: [id number] Date of Birth/Sex: 03/12/1941 (75 y.o. Male) Treating RN: Montey Hora Primary Care Physician: [facility], Dossie Der Other Clinician: Referring Physician: Keith Rake Treating Physician/Extender: Benjaman Pott in Treatment: 6 Active Problems Location of Pain Severity and Description of Pain Patient Has Paino No Site Locations Pain Management and Medication Current Pain Management: Notes Topical or injectable lidocaine is offered to patient for acute pain when surgical debridement is performed. If needed, Patient is instructed to use over the counter pain medication for the following 24-48 hours after debridement. Wound care MDs do not prescribed pain medications. Patient has chronic pain or uncontrolled pain. Patient  has been instructed to make an appointment with their Primary Care Physician for pain management. Electronic Signature(s) Signed: 11/01/2015 5:21:35 PM By: Montey Hora Entered By: Montey Hora on 11/01/2015 14:18:50 Seth Mcintosh  ([id number]) -------------------------------------------------------------------------------- Patient/Caregiver Education Details Patient Name: Seth Mcintosh Date of Service: 11/01/2015 2:15 PM Medical Record Number: [id number] Patient Account Number: [id number] Date of Birth/Gender: October 19, 1940 (75 y.o. Male) Treating RN: Montey Hora Primary Care Physician: [facility], Dossie Der Other Clinician: Referring Physician: [facility], [facility] Treating Physician/Extender: Benjaman Pott in Treatment: 6 Education Assessment Education Provided To: Patient Education Topics Provided Wound/Skin Impairment: Handouts: Other: wound care to continue as ordered Methods: Demonstration, Explain/Verbal Responses: State content correctly Electronic Signature(s) Signed: 11/02/2015 1:16:39 PM By: Judene Companion MD Entered By: Judene Companion on 11/01/2015 15:03:17 Seth Mcintosh ([id number]) -------------------------------------------------------------------------------- Wound Assessment Details Patient Name: Seth Mcintosh Date of Service: 11/01/2015 2:15 PM Medical Record Number: [id number] Patient Account Number: [id number] Date of Birth/Sex: 1940/06/30 (75 y.o. Male) Treating RN: Montey Hora Primary Care Physician: [facility], Dossie Der Other Clinician: Referring Physician: [facility], [facility] Treating Physician/Extender: Benjaman Pott in Treatment: 6 Wound Status Wound Number: 1 Primary Trauma, Other Etiology: Wound Location: Right Lower Leg - Lateral Wound Open Wounding Event: Trauma Status: Date Acquired: 08/07/2015 Comorbid Glaucoma, Sleep Apnea, Coronary Weeks Of Treatment: 6 History: Artery Disease, Hypertension, Type II Clustered Wound: No Diabetes, Osteoarthritis Photos Photo Uploaded By: Montey Hora on 11/01/2015 14:53:47 Wound Measurements Length: (cm) 2 % Reduction in Width: (cm) 1.2 % Reduction in Depth: (cm) 0.5 Epithelializat Area: (cm) 1.885 Tunneling: Volume: (cm)  0.942 Undermining: Starting Po Ending Posi Maximum Dis Area: 52% Volume: 65.7% ion: None No Yes sition (o'clock): 3 tion (o'clock): 6 tance: (cm) 1.5 Wound Description Full Thickness With Exposed Classification: Support Structures Diabetic Severity Grade 1 (Wagner): Wound Margin: Thickened Exudate Amount: Medium Exudate Type: Serosanguineous Seth Mcintosh, Seth Mcintosh ([id number]) Foul Odor After Cleansing: No Exudate Color: red, brown Wound Bed Granulation Amount: Large (67-100%) Exposed Structure Granulation Quality: Red Fascia Exposed: No Necrotic Amount: Small (1-33%) Fat Layer Exposed: No Necrotic Quality: Adherent Slough Tendon Exposed: No Muscle Exposed: No Joint Exposed: No Bone Exposed: No Limited to Skin Breakdown Periwound Skin Texture Texture Color No Abnormalities Noted: No No Abnormalities Noted: No Localized Edema: Yes Temperature / Pain Moisture Temperature: No Abnormality No Abnormalities Noted: No Tenderness on Palpation: Yes Moist: Yes Wound Preparation Ulcer Cleansing: Rinsed/Irrigated with Saline Topical Anesthetic Applied: Other: lidocaine 4%, Treatment Notes Wound #1 (Right, Lateral Lower Leg) 1. Cleansed with: Clean wound with Normal Saline 2. Anesthetic Topical Lidocaine 4% cream to wound bed prior to debridement 3. Peri-wound Care: Skin Prep 4. Dressing Applied: Aquacel Ag 5. Secondary Dressing Applied Bordered Foam Dressing 7. Secured with Patient to wear own compression stockings Electronic Signature(s) Signed: 11/01/2015 5:21:35 PM By: Montey Hora Entered By: Montey Hora on 11/01/2015 14:27:52 Seth Mcintosh ([id number]) -------------------------------------------------------------------------------- [place] Details Patient Name: Seth Mcintosh Date of Service: 11/01/2015 2:15 PM Medical Record Number: [id number] Patient Account Number: [id number] Date of Birth/Sex: 04-Jan-1941 (75 y.o. Male) Treating RN: Montey Hora Primary Care Physician: [facility], [facility] Other Clinician: Referring Physician: [facility], [facility] Treating Physician/Extender: Benjaman Pott in Treatment: 6 Vital Signs Time Taken: 14:20 Temperature (F): 98.0 Height (in): 74 Pulse (bpm): 60 Weight (lbs): 265 Respiratory Rate (breaths/min): 18 Body Mass Index (BMI): 34 Blood Pressure (mmHg): 114/62 Reference Range: 80 - 120 mg / dl Electronic Signature(s) Signed: 11/01/2015 5:21:35 PM By: Montey Hora Entered By: Montey Hora on 11/01/2015 14:20:05

## 2015-11-02 NOTE — Progress Notes (Addendum)
VA, CHUKWU ([id number]) Visit Report for 11/01/2015 Chief Complaint Document Details Patient Name: Seth Mcintosh, Seth Mcintosh Date of Service: 11/01/2015 2:15 PM Medical Record Number: [id number] Patient Account Number: [id number] Date of Birth/Sex: 1940-10-02 (75 y.o. Male) Treating RN: Montey Hora Primary Care Physician: [facility], [place]oss[place] Der Other Clinician: Referring Physician: Keith Rake Treating Physician/Extender: Benjaman Pott in Treatment: 6 Information Obtained from: Patient Chief Complaint Patient seen for complaints of Non-Healing Wound which she's had on the right lower extremity for about 5 weeks Electronic Signature(s) Signed: 11/01/2015 3:01:39 PM By: Judene Companion MD Entered By: Judene Companion on 11/01/2015 15:01:38 Seth Mcintosh ([id number]) -------------------------------------------------------------------------------- Debridement Details Patient Name: Seth Mcintosh Date of Service: 11/01/2015 2:15 PM Medical Record Number: [id number] Patient Account Number: [id number] Date of Birth/Sex: October 16, 1940 (75 y.o. Male) Treating RN: Montey Hora Primary Care Physician: [facility], [place]ossie Der Other Clinician: Referring Physician: Keith Rake Treating Physician/Extender: Benjaman Pott in Treatment: 6 Debridement Performed for Wound #1 Right,Lateral Lower Leg Assessment: Performed By: Physician Judene Companion, MD Debridement: Debridement Pre-procedure Yes Verification/Time Out Taken: Start Time: 14:39 Pain Control: Lidocaine 4% Topical Solution Level: Skin/Subcutaneous Tissue Total Area Debrided (L x 2 (cm) x 1.2 (cm) = 2.4 (cm) W): Tissue and other Viable, Non-Viable, Fibrin/Slough, Subcutaneous material debrided: Instrument: Curette Bleeding: Minimum Hemostasis Achieved: Pressure End Time: 14:40 Procedural Pain: 0 Post Procedural Pain: 0 Response to Treatment: Procedure was tolerated well Post Debridement Measurements of Total Wound Length: (cm) 2 Width: (cm)  1.2 Depth: (cm) 0.5 Volume: (cm) 0.942 Post Procedure Diagnosis Same as Pre-procedure Electronic Signature(s) Signed: 11/01/2015 5:21:35 PM By: Montey Hora Signed: 11/02/2015 1:16:39 PM By: Judene Companion MD Entered By: Montey Hora on 11/01/2015 14:39:43 Seth Mcintosh ([id number]) -------------------------------------------------------------------------------- HPI Details Patient Name: Seth Mcintosh Date of Service: 11/01/2015 2:15 PM Medical Record Number: [id number] Patient Account Number: [id number] Date of Birth/Sex: [birth year] (75 y.o. Male) Treating RN: Montey Hora Primary Care Physician: [facility], [place]ossie Der Other Clinician: Referring Physician: [facility], [facility] Treating Physician/Extender: Benjaman Pott in Treatment: 6 History of Present Illness Location: open wound right lower extremity lateral leg Quality: Patient reports experiencing a dull pain to affected area(s). Severity: Patient states wound are getting worse. Duration: Patient has had the wound for > 6 weeks prior to seeking treatment at the wound center Timing: Pain in wound is Intermittent (comes and goes Context: The wound occurred when the patient had a injury with a blunt object around 08/07/2015 Modifying Factors: a large swelling with drainage Associated Signs and Symptoms: Patient reports having increase swelling. HPI Description: 75 year old gentleman with known history of diabetes mellitus without complication had a lacerated wound to his right lower extremity which was repaired about 4 weeks ago. Was seen by his PCP Dr. Donneta Romberg with a open wound on the right lateral leg with some discharge and erythema and he was placed on a blocks and local dressing was applied and referred to [place] for further care. Past medical history significant for diabetes mellitus type 2, coronary artery disease, hypertension, arthritis, depression, foot surgery o2, back surgery, CABG, TURP, right total knee replacement. He is a  former smoker and quit in 1994. On questioning the patient he gives a history that he had a blunt injury to his right leg and this happened on April 15 of this year and about 2 weeks later he had blood draining from here and had to go to the ER and have sutures placed. However on looking at his medical records he was in the ER on 05/17/2015  with a blunt injury on his right lower extremity and at that stage x-ray showed no fracture and the right lower leg ultrasound showed no evidence of DVT. Diagnosis of a hematoma was made and he was given symptomatic treatment for this. The next time he was seen in the emergency room was on 08/11/2015 when the patient had a 10 cm laceration to the right lateral leg and this was sutured by the ER physician Dr. Dahlia Client. His blood alcohol level was 162 mg percent that day and the sutures were to be removed in 10 days' time. 09/23/2015 -- had his ultrasound of the left lower extremity for possible hematoma and the impression was there was no discrete drainable hematoma in the area of clinical concern on the left lower extremity. On reviewing his history he clearly states that he never had a large lacerated wound due to the injury or fall on April 19. He says he did have a few drinks but has not drunk and the hematoma which he had for a couple of months just burst open and caused the big bleed and extrusion of clots. Electronic Signature(s) Signed: 11/01/2015 3:01:49 PM By: Judene Companion MD Entered By: Judene Companion on 11/01/2015 15:01:49 Seth Mcintosh ([id number]) -------------------------------------------------------------------------------- Physical Exam Details Patient Name: Seth Mcintosh Date of Service: 11/01/2015 2:15 PM Medical Record Number: [id number] Patient Account Number: [id number] Date of Birth/Sex: [birth year] (75 y.o. Male) Treating RN: Montey Hora Primary Care Physician: [facility], [place]ossie Der Other Clinician: Referring Physician: [facility],  [place]ossie Der Treating Physician/Extender: Benjaman Pott in Treatment: 6 Electronic Signature(s) Signed: 11/01/2015 3:01:58 PM By: Judene Companion MD Entered By: Judene Companion on 11/01/2015 15:01:58 Seth Mcintosh ([id number]) -------------------------------------------------------------------------------- Physician Orders Details Patient Name: Seth Mcintosh Date of Service: 11/01/2015 2:15 PM Medical Record Number: [id number] Patient Account Number: [id number] Date of Birth/Sex: 02-19-1941 (75 y.o. Male) Treating RN: Montey Hora Primary Care Physician: [facility], [place]ossie Der Other Clinician: Referring Physician: [facility], [place]ie Der Treating Physician/Extender: Benjaman Pott in Treatment: 6 Verbal / Phone Orders: Yes Clinician: Montey Hora Read Back and Verified: Yes Diagnosis Coding Wound Cleansing Wound #1 Right,Lateral Lower Leg o Cleanse wound with mild soap and water o May Shower, gently pat wound dry prior to applying new dressing. Anesthetic Wound #1 Right,Lateral Lower Leg o Topical Lidocaine 4% cream applied to wound bed prior to debridement Skin Barriers/Peri-Wound Care Wound #1 Right,Lateral Lower Leg o Skin Prep Primary Wound Dressing Wound #1 Right,Lateral Lower Leg o Aquacel Ag - rope Secondary Dressing Wound #1 Right,Lateral Lower Leg o Boardered Foam Dressing Dressing Change Frequency Wound #1 Right,Lateral Lower Leg o Change dressing every day. Edema Control Wound #1 Right,Lateral Lower Leg o Elevate legs to the level of the heart and pump ankles as often as possible o Support Garment 20-30 mm/Hg pressure to: - wear compression stockings Additional Orders / Instructions Wound #1 Right,Lateral Lower Leg o Increase protein intake. DEMARI, SINS ([id number]) Electronic Signature(s) Signed: 11/01/2015 5:21:35 PM By: Montey Hora Signed: 11/02/2015 1:16:39 PM By: Judene Companion MD Entered By: Montey Hora on 11/01/2015 14:40:17 Seth Mcintosh ([id number]) -------------------------------------------------------------------------------- Problem List Details Patient Name: Seth Mcintosh Date of Service: 11/01/2015 2:15 PM Medical Record Number: [id number] Patient Account Number: [id number] Date of Birth/Sex: 13-Nov-1940 (75 y.o. 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 Male) Treating RN: Montey Hora Primary Care Physician: [facility], [place]ossie Der Other Clinician: Referring Physician: [facility], [facility] Treating Physician/Extender: Benjaman Pott in Treatment: 6 Subjective Chief Complaint Information obtained from Patient Patient seen for complaints of Non-Healing Wound which she's had on the right lower extremity for about 5 weeks History of Present Illness (HPI) The following HPI elements were documented for the patient's wound: Location: open wound right lower extremity lateral leg Quality: Patient reports experiencing a dull pain to affected area(s). Severity: Patient states wound are  getting worse. Duration: Patient has had the wound for > 6 weeks prior to seeking treatment at the wound center Timing: Pain in wound is Intermittent (comes and goes Context: The wound occurred when the patient had a injury with a blunt object around 08/07/2015 Modifying Factors: a large swelling with drainage Associated Signs and Symptoms: Patient reports having increase swelling. 75 year old gentleman with known history of diabetes mellitus without complication had a lacerated wound to his right lower extremity which was repaired about 4 weeks ago. Was seen by his PCP Dr. Donneta Romberg with a open wound on the right lateral leg with some discharge and erythema and he was placed on a blocks and local dressing was applied and referred to [place] for further care. Past medical history significant for diabetes mellitus type 2, coronary artery disease, hypertension, arthritis, depression, foot surgery o2, back surgery, CABG, TURP, right total knee replacement. He is a former smoker and quit in 1994. On questioning the patient he gives a history that he had a blunt injury to his right leg and this happened on April 15 of this year and about 2 weeks later he had blood draining from here and had to go to the ER and have sutures placed. However on looking at his medical records he was in the ER on 05/17/2015 with a blunt injury on his right lower extremity and at that stage x-ray showed no fracture and the right lower leg ultrasound showed no evidence of DVT. Diagnosis of a hematoma was made and he was given symptomatic treatment for this. The next time he was seen in the emergency room was on 08/11/2015 when the patient had a 10 cm laceration to the right lateral leg and this was sutured by the ER physician Dr. Dahlia Client. His blood alcohol level was 162 mg percent that day and the sutures were to be removed in 10 days' time. 09/23/2015 -- had his ultrasound of the left lower extremity for possible hematoma and  the impression was there was no discrete drainable hematoma in the area of clinical concern on the left lower extremity. On reviewing his history he clearly states that he never had a large lacerated wound due to the injury or fall on April 19. He says he did have a few drinks but has not drunk and the hematoma which he had for a SAJJAD, BILLETT. ([id number]) couple of months just burst open and caused the big bleed and extrusion of clots. Objective Constitutional Vitals Time  Taken: 2:20 PM, Height: 74 in, Weight: 265 lbs, BMI: 34, Temperature: 98.0 F, Pulse: 60 bpm, Respiratory Rate: 18 breaths/min, Blood Pressure: 114/62 mmHg. Integumentary (Hair, Skin) Wound #1 status is Open. Original cause of wound was Trauma. The wound is located on the Right,Lateral Lower Leg. The wound measures 2cm length x 1.2cm width x 0.5cm depth; 1.885cm^2 area and 0.942cm^3 volume. The wound is limited to skin breakdown. There is no tunneling noted, however, there is undermining starting at 3:00 and ending at 6:00 with a maximum distance of 1.5cm. There is a medium amount of serosanguineous drainage noted. The wound margin is thickened. There is large (67-100%) red granulation within the wound bed. There is a small (1-33%) amount of necrotic tissue within the wound bed including Adherent Slough. The periwound skin appearance exhibited: Localized Edema, Moist. Periwound temperature was noted as No Abnormality. The periwound has tenderness on palpation. 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 There was a Skin/Subcutaneous Tissue Debridement [id number]) debridement with total area of 2.4 sq cm performed by  Judene Companion, MD. with the following instrument(s): Curette to remove Viable and Non-Viable tissue/material including Fibrin/Slough and Subcutaneous after achieving pain control using Lidocaine 4% Topical Solution. A time out was conducted prior to the start of the procedure. A Minimum amount of bleeding was controlled MAHLIK, HLADKY. ([id number]) with Pressure. The procedure was tolerated well with a pain level of 0 throughout and a pain level of 0 following the procedure. Post Debridement Measurements: 2cm length x 1.2cm width x 0.5cm depth; 0.942cm^3 volume. Post procedure Diagnosis Wound #1: Same as Pre-Procedure Plan Wound Cleansing: Wound #1 Right,Lateral Lower Leg: Cleanse wound with mild soap and water May Shower, gently pat wound dry prior to applying new dressing. Anesthetic: Wound #1 Right,Lateral Lower Leg: Topical Lidocaine 4% cream applied to wound bed prior to debridement Skin Barriers/Peri-Wound Care: Wound #1 Right,Lateral Lower Leg: Skin Prep Primary Wound Dressing: Wound #1 Right,Lateral Lower Leg: Aquacel Ag - rope Secondary Dressing: Wound #1 Right,Lateral Lower Leg: Boardered Foam Dressing Dressing Change Frequency: Wound #1 Right,Lateral Lower Leg: Change dressing every day. Edema Control: Wound #1 Right,Lateral Lower Leg: Elevate legs to the level of the heart and pump ankles as often as possible Support Garment 20-30 mm/Hg pressure to: - wear compression stockings Additional Orders / Instructions: Wound #1 Right,Lateral Lower Leg: Increase protein intake. Follow-Up Appointments: A follow-up appointment should be scheduled. A Patient Clinical Summary of Care was provided to CAILLOU, SNEDEKER ([id number]) Electronic Signature(s) Signed: 11/12/2015 11:44:45 AM By: Judene Companion MD Previous Signature: 11/01/2015 3:02:14 PM Version By: Judene Companion MD Entered By: Judene Companion on 11/12/2015 11:44:44 Seth Mcintosh  ([id number]) -------------------------------------------------------------------------------- SuperBill Details Patient Name: Seth Mcintosh Date of Service: 11/01/2015 Medical Record Number: [id number] Patient Account Number: [id number] Date of Birth/Sex: [birth year] (75 y.o. Male) Treating RN: Montey Hora Primary Care Physician: [facility], Dossie Der Other Clinician: Referring Physician: Keith Rake Treating Physician/Extender: Benjaman Pott in Treatment: 6 Diagnosis Coding ICD-10 Codes Code Description E11.622 Type 2 diabetes mellitus with other skin ulcer L97.213 Non-pressure chronic ulcer of right calf with necrosis of muscle M70.861 Other soft tissue disorders related to use, overuse and pressure, right lower leg Facility Procedures  CPT4 Code: [id number] Description: [id number] - DEB SUBQ TISSUE 20 SQ CM/< ICD-10 Description Diagnosis E11.622 Type 2 diabetes mellitus with other skin ulcer Modifier: Quantity: 1 Physician Procedures CPT4 Code Description: [id number] - WC PHYS LEVEL 3 - EST PT ICD-10 Description Diagnosis L97.213 Non-pressure chronic ulcer of right calf with necrosi Modifier: s of muscle Quantity: 1 CPT4 Code Description: [id number] - WC PHYS SUBQ TISS 20 SQ CM ICD-10 Description Diagnosis E11.622 Type 2 diabetes mellitus with other skin ulcer Modifier: Quantity: 1 Electronic Signature(s) Signed: 11/01/2015 3:02:47 PM By: Judene Companion MD Entered By: Judene Companion on 11/01/2015 15:02:47

## 2015-11-05 ENCOUNTER — Other Ambulatory Visit: Payer: Self-pay | Admitting: Family Medicine

## 2015-11-08 ENCOUNTER — Encounter: Payer: Medicare Other | Admitting: Surgery

## 2015-11-08 DIAGNOSIS — S81801A Unspecified open wound, right lower leg, initial encounter: Secondary | ICD-10-CM | POA: Diagnosis not present

## 2015-11-08 DIAGNOSIS — I2510 Atherosclerotic heart disease of native coronary artery without angina pectoris: Secondary | ICD-10-CM | POA: Diagnosis not present

## 2015-11-08 DIAGNOSIS — Z87891 Personal history of nicotine dependence: Secondary | ICD-10-CM | POA: Diagnosis not present

## 2015-11-08 DIAGNOSIS — M70861 Other soft tissue disorders related to use, overuse and pressure, right lower leg: Secondary | ICD-10-CM | POA: Diagnosis not present

## 2015-11-08 DIAGNOSIS — I10 Essential (primary) hypertension: Secondary | ICD-10-CM | POA: Diagnosis not present

## 2015-11-08 DIAGNOSIS — L97213 Non-pressure chronic ulcer of right calf with necrosis of muscle: Secondary | ICD-10-CM | POA: Diagnosis not present

## 2015-11-08 DIAGNOSIS — E11622 Type 2 diabetes mellitus with other skin ulcer: Secondary | ICD-10-CM | POA: Diagnosis not present

## 2015-11-09 ENCOUNTER — Other Ambulatory Visit: Payer: Self-pay | Admitting: Emergency Medicine

## 2015-11-09 MED ORDER — LOSARTAN POTASSIUM 50 MG PO TABS: 50.0000 mg | ORAL_TABLET | Freq: Every day | ORAL | Status: DC

## 2015-11-09 NOTE — Progress Notes (Signed)
Seth Mcintosh ([id number]) Visit Report for 11/08/2015 Arrival Information Details Patient Name: Seth Mcintosh, Seth Mcintosh Date of Service: 11/08/2015 12:45 PM Medical Record Number: [id number] Patient Account Number: [id number] Date of Birth/Sex: [birth year] (75 y.o. Male) Treating RN: Ahmed Prima Primary Care Physician: [facility], Dossie Der Other Clinician: Referring Physician: [facility], [place]ossie Der Treating Physician/Extender: Frann Rider in Treatment: 7 Visit Information History Since Last Visit All ordered tests and consults were completed: No Patient Arrived: Ambulatory Added or deleted any medications: No Arrival Time: 12:50 Any new allergies or adverse reactions: No Accompanied By: self Had a fall or experienced change in No Transfer Assistance: None activities of daily living that may affect Patient Identification Verified: Yes risk of falls: Secondary Verification Process Yes Signs or symptoms of abuse/neglect since last No Completed: visito Patient Requires Transmission-Based No Hospitalized since last visit: No Precautions: Pain Present Now: No Patient Has Alerts: Yes Patient Alerts: DM II Electronic Signature(s) Signed: 11/08/2015 4:39:30 PM By: Alric Quan Entered By: Alric Quan on 11/08/2015 12:50:32 Seth Mcintosh ([id number]) -------------------------------------------------------------------------------- Clinic Level of Care Assessment Details Patient Name: Seth Mcintosh Date of Service: 11/08/2015 12:45 PM Medical Record Number: [id number] Patient Account Number: [id number] Date of Birth/Sex: 1940-09-21 (75 y.o. Male) Treating RN: Ahmed Prima Primary Care Physician: [facility], Dossie Der Other Clinician: Referring Physician: [facility], [facility] Treating Physician/Extender: Frann Rider in Treatment: 7 Clinic Level of Care Assessment Items TOOL 4 Quantity Score X - Use when only an EandM is performed on FOLLOW-UP visit 1 0 ASSESSMENTS - Nursing Assessment /  Reassessment X - Reassessment of Co-morbidities (includes updates in patient status) 1 10 X - Reassessment of Adherence to Treatment Plan 1 5 ASSESSMENTS - Wound and Skin Assessment / Reassessment X - Simple Wound Assessment / Reassessment - one wound 1 5 []  - Complex Wound Assessment / Reassessment - multiple wounds 0 []  - Dermatologic / Skin Assessment (not related to wound area) 0 ASSESSMENTS - Focused Assessment []  - Circumferential Edema Measurements - multi extremities 0 []  - Nutritional Assessment / Counseling / Intervention 0 []  - Lower Extremity Assessment (monofilament, tuning fork, pulses) 0 []  - Peripheral Arterial Disease Assessment (using hand held doppler) 0 ASSESSMENTS - Ostomy and/or Continence Assessment and Care []  - Incontinence Assessment and Management 0 []  - Ostomy Care Assessment and Management (repouching, etc.) 0 PROCESS - Coordination of Care X - Simple Patient / Family Education for ongoing care 1 15 []  - Complex (extensive) Patient / Family Education for ongoing care 0 []  - Staff obtains Programmer, systems, Records, Test Results / Process Orders 0 []  - Staff telephones HHA, Nursing Homes / Clarify orders / etc 0 []  - Routine Transfer to another Facility (non-emergent condition) 0 Seth Mcintosh, Seth Mcintosh ([id number]) []  - Routine Hospital Admission (non-emergent condition) 0 []  - New Admissions / Biomedical engineer / Ordering NPWT, Apligraf, etc. 0 []  - Emergency Hospital Admission (emergent condition) 0 X - Simple Discharge Coordination 1 10 []  - Complex (extensive) Discharge Coordination 0 PROCESS - Special Needs []  - Pediatric / Minor Patient Management 0 []  - Isolation Patient Management 0 []  - Hearing / Language / Visual special needs 0 []  - Assessment of Community assistance (transportation, D/C planning, etc.) 0 []  - Additional assistance / Altered mentation 0 []  - Support Surface(s) Assessment (bed, cushion, seat, etc.) 0 INTERVENTIONS - Wound Cleansing /  Measurement X - Simple Wound Cleansing - one wound 1 5 []  - Complex Wound Cleansing - multiple wounds 0 X - Wound Imaging (photographs - any number of  wounds) 1 5 []  - Wound Tracing (instead of photographs) 0 X - Simple Wound Measurement - one wound 1 5 []  - Complex Wound Measurement - multiple wounds 0 INTERVENTIONS - Wound Dressings []  - Small Wound Dressing one or multiple wounds 0 X - Medium Wound Dressing one or multiple wounds 1 15 []  - Large Wound Dressing one or multiple wounds 0 X - Application of Medications - topical 1 5 []  - Application of Medications - injection 0 INTERVENTIONS - Miscellaneous []  - External ear exam 0 Seth Mcintosh, Seth Mcintosh ([id number]) []  - Specimen Collection (cultures, biopsies, blood, body fluids, etc.) 0 []  - Specimen(s) / Culture(s) sent or taken to Lab for analysis 0 []  - Patient Transfer (multiple staff / Harrel Lemon Lift / Similar devices) 0 []  - Simple Staple / Suture removal (25 or less) 0 []  - Complex Staple / Suture removal (26 or more) 0 []  - Hypo / Hyperglycemic Management (close monitor of Blood Glucose) 0 []  - Ankle / Brachial Index (ABI) - do not check if billed separately 0 X - Vital Signs 1 5 Has the patient been seen at the hospital within the last three years: Yes Total Score: 85 Level Of Care: New/Established - Level 3 Electronic Signature(s) Signed: 11/08/2015 4:39:30 PM By: Alric Quan Entered By: Alric Quan on 11/08/2015 16:32:51 Seth Mcintosh ([id number]) -------------------------------------------------------------------------------- Encounter Discharge Information Details Patient Name: Seth Mcintosh Date of Service: 11/08/2015 12:45 PM Medical Record Number: [id number] Patient Account Number: [id number] Date of Birth/Sex: 12-27-1940 (75 y.o. Male) Treating RN: Ahmed Prima Primary Care Physician: [facility], Dossie Der Other Clinician: Referring Physician: [facility], [facility] Treating Physician/Extender: Frann Rider in  Treatment: 7 Encounter Discharge Information Items Discharge Pain Level: 0 Discharge Condition: Stable Ambulatory Status: Ambulatory Discharge Destination: Home Transportation: Private Auto Accompanied By: self Schedule Follow-up Appointment: Yes Medication Reconciliation completed and provided to Patient/Care Yes Seth Mcintosh: Provided on Clinical Summary of Care: 11/08/2015 Form Type Recipient Paper Patient JK Electronic Signature(s) Signed: 11/08/2015 1:14:17 PM By: Ruthine Dose Entered By: Ruthine Dose on 11/08/2015 13:14:17 Seth Mcintosh ([id number]) -------------------------------------------------------------------------------- Lower Extremity Assessment Details Patient Name: Seth Mcintosh Date of Service: 11/08/2015 12:45 PM Medical Record Number: [id number] Patient Account Number: [id number] Date of Birth/Sex: 1940-09-27 (75 y.o. Male) Treating RN: Ahmed Prima Primary Care Physician: [facility], Dossie Der Other Clinician: Referring Physician: [facility], [facility] Treating Physician/Extender: Frann Rider in Treatment: 7 Vascular Assessment Pulses: Posterior Tibial Dorsalis Pedis Palpable: [Right:Yes] Extremity colors, hair growth, and conditions: Extremity Color: [Right:Normal] Temperature of Extremity: [Right:Warm] Capillary Refill: [Right:< 3 seconds] Electronic Signature(s) Signed: 11/08/2015 4:39:30 PM By: Alric Quan Entered By: Alric Quan on 11/08/2015 12:59:44 Seth Mcintosh ([id number]) -------------------------------------------------------------------------------- Multi Wound Chart Details Patient Name: Seth Mcintosh Date of Service: 11/08/2015 12:45 PM Medical Record Number: [id number] Patient Account Number: [id number] Date of Birth/Sex: Feb 17, 1941 (75 y.o. Male) Treating RN: Ahmed Prima Primary Care Physician: [facility], Dossie Der Other Clinician: Referring Physician: [facility], [facility] Treating Physician/Extender: Frann Rider in Treatment:  7 Vital Signs Height(in): 74 Pulse(bpm): 78 Weight(lbs): 265 Blood Pressure 152/62 (mmHg): Body Mass Index(BMI): 34 Temperature(F): 97.7 Respiratory Rate 18 (breaths/min): Photos: [1:No Photos] [N/A:N/A] Wound Location: [1:Right Lower Leg - Lateral] [N/A:N/A] Wounding Event: [1:Trauma] [N/A:N/A] Primary Etiology: [1:Trauma, Other] [N/A:N/A] Comorbid History: [1:Glaucoma, Sleep Apnea, Coronary Artery Disease, Hypertension, Type II Diabetes, Osteoarthritis] [N/A:N/A] Date Acquired: [1:08/07/2015] [N/A:N/A] Weeks of Treatment: [1:7] [N/A:N/A] Wound Status: [1:Open] [N/A:N/A] Measurements L x W x D 1.8x1x0.3 [N/A:N/A] (cm) Area (cm) : [1:1.414] [N/A:N/A] Volume (cm) : [1:0.424] [N/A:N/A] %  Reduction in Area: [1:64.00%] [N/A:N/A] % Reduction in Volume: 84.60% [N/A:N/A] Starting Position 1 2 (o'clock): Ending Position 1 [1:6] (o'clock): Maximum Distance 1 2.2 (cm): Undermining: [1:Yes] [N/A:N/A] Classification: [1:Full Thickness With Exposed Support Structures] [N/A:N/A] HBO Classification: [1:Grade 1] [N/A:N/A] Exudate Amount: [1:Large] [N/A:N/A] Exudate Type: [1:Serosanguineous] [N/A:N/A] Exudate Color: red, brown N/A N/A Wound Margin: Thickened N/A N/A Granulation Amount: Large (67-100%) N/A N/A Granulation Quality: Red N/A N/A Necrotic Amount: Small (1-33%) N/A N/A Exposed Structures: Fascia: No N/A N/A Fat: No Tendon: No Muscle: No Joint: No Bone: No Limited to Skin Breakdown Epithelialization: None N/A N/A Periwound Skin Texture: Edema: Yes N/A N/A Periwound Skin Moist: Yes N/A N/A Moisture: Periwound Skin Color: No Abnormalities Noted N/A N/A Temperature: No Abnormality N/A N/A Tenderness on Yes N/A N/A Palpation: Wound Preparation: Ulcer Cleansing: N/A N/A Rinsed/Irrigated with Saline Topical Anesthetic Applied: Other: lidocaine 4% Treatment Notes Electronic Signature(s) Signed: 11/08/2015 4:39:30 PM By: Alric Quan Entered By:  Alric Quan on 11/08/2015 12:59:57 Seth Mcintosh ([id number]) -------------------------------------------------------------------------------- Multi-Disciplinary Care Plan Details Patient Name: Seth Mcintosh Date of Service: 11/08/2015 12:45 PM Medical Record Number: [id number] Patient Account Number: [id number] Date of Birth/Sex: May 04, 1940 (75 y.o. Male) Treating RN: Ahmed Prima Primary Care Physician: [facility], Dossie Der Other Clinician: Referring Physician: [facility], Dossie Der Treating Physician/Extender: Frann Rider in Treatment: 7 Active Inactive Abuse / Safety / Falls / Self Care Management Nursing Diagnoses: Potential for falls Goals: Patient will remain injury free Date Initiated: 09/16/2015 Goal Status: Active Interventions: Assess fall risk on admission and as needed Notes: Orientation to the Wound Care Program Nursing Diagnoses: Knowledge deficit related to the wound healing center program Goals: Patient/caregiver will verbalize understanding of the [place] Program Date Initiated: 09/16/2015 Goal Status: Active Interventions: Provide education on orientation to the wound center Notes: Pain, Acute or Chronic Nursing Diagnoses: Pain, acute or chronic: actual or potential Potential alteration in comfort, pain Goals: Seth Mcintosh, Seth Mcintosh ([id number]) Patient will verbalize adequate pain control and receive pain control interventions during procedures as needed Date Initiated: 09/16/2015 Goal Status: Active Interventions: Assess comfort goal upon admission Complete pain assessment as per visit requirements Notes: Soft Tissue Infection Nursing Diagnoses: Impaired tissue integrity Goals: Patient will remain free of wound infection Date Initiated: 09/16/2015 Goal Status: Active Interventions: Assess signs and symptoms of infection every visit Notes: Wound/Skin Impairment Nursing Diagnoses: Impaired tissue integrity Goals: Ulcer/skin breakdown  will have a volume reduction of 30% by week 4 Date Initiated: 09/16/2015 Goal Status: Active Ulcer/skin breakdown will have a volume reduction of 50% by week 8 Date Initiated: 09/16/2015 Goal Status: Active Ulcer/skin breakdown will have a volume reduction of 80% by week 12 Date Initiated: 09/16/2015 Goal Status: Active Interventions: Assess patient/caregiver ability to obtain necessary supplies Assess ulceration(s) every visit Notes: Seth Mcintosh, Seth Mcintosh ([id number]) Electronic Signature(s) Signed: 11/08/2015 4:39:30 PM By: Alric Quan Entered By: Alric Quan on 11/08/2015 12:59:51 Seth Mcintosh ([id number]) -------------------------------------------------------------------------------- Pain Assessment Details Patient Name: Seth Mcintosh Date of Service: 11/08/2015 12:45 PM Medical Record Number: [id number] Patient Account Number: [id number] Date of Birth/Sex: [birth year] (75 y.o. Male) Treating RN: Ahmed Prima Primary Care Physician: [facility], Dossie Der Other Clinician: Referring Physician: Keith Rake Treating Physician/Extender: Frann Rider in Treatment: 7 Active Problems Location of Pain Severity and Description of Pain Patient Has Paino No Site Locations With Dressing Change: No Pain Management and Medication Current Pain Management: Notes Topical or injectable lidocaine is offered to patient for acute pain when surgical debridement is performed. If needed, Patient is  instructed to use over the counter pain medication for the following 24-48 hours after debridement. Wound care MDs do not prescribed pain medications. Electronic Signature(s) Signed: 11/08/2015 4:39:30 PM By: Alric Quan Entered By: Alric Quan on 11/08/2015 12:50:40 Seth Mcintosh ([id number]) -------------------------------------------------------------------------------- Patient/Caregiver Education Details Patient Name: Seth Mcintosh Date of Service: 11/08/2015 12:45  PM Medical Record Number: [id number] Patient Account Number: [id number] Date of Birth/Gender: [birth year] (75 y.o. Male) Treating RN: Ahmed Prima Primary Care Physician: [facility], Dossie Der Other Clinician: Referring Physician: [facility], [facility] Treating Physician/Extender: Frann Rider in Treatment: 7 Education Assessment Education Provided To: Patient Education Topics Provided Wound/Skin Impairment: Handouts: Other: change dressing as ordered Methods: Demonstration, Explain/Verbal Responses: State content correctly Electronic Signature(s) Signed: 11/08/2015 4:39:30 PM By: Alric Quan Entered By: Alric Quan on 11/08/2015 13:00:31 Seth Mcintosh ([id number]) -------------------------------------------------------------------------------- Wound Assessment Details Patient Name: Seth Mcintosh Date of Service: 11/08/2015 12:45 PM Medical Record Number: [id number] Patient Account Number: [id number] Date of Birth/Sex: [birth year] (75 y.o. Male) Treating RN: Ahmed Prima Primary Care Physician: [facility], Dossie Der Other Clinician: Referring Physician: [facility], [facility] Treating Physician/Extender: Frann Rider in Treatment: 7 Wound Status Wound Number: 1 Primary Trauma, Other Etiology: Wound Location: Right Lower Leg - Lateral Wound Open Wounding Event: Trauma Status: Date Acquired: 08/07/2015 Comorbid Glaucoma, Sleep Apnea, Coronary Weeks Of Treatment: 7 History: Artery Disease, Hypertension, Type II Clustered Wound: No Diabetes, Osteoarthritis Photos Photo Uploaded By: Alric Quan on 11/08/2015 16:19:50 Wound Measurements Length: (cm) 1.8 % Reduction in Width: (cm) 1 % Reduction in Depth: (cm) 0.3 Epithelializat Area: (cm) 1.414 Tunneling: Volume: (cm) 0.424 Undermining: Starting Po Ending Posi Maximum Dis Area: 64% Volume: 84.6% ion: None No Yes sition (o'clock): 2 tion (o'clock): 6 tance: (cm) 2.2 Wound Description Full Thickness With  Exposed Classification: Support Structures Diabetic Severity Grade 1 (Wagner): Wound Margin: Thickened Exudate Amount: Large Exudate Type: Serosanguineous Seth Mcintosh, Seth Mcintosh ([id number]) Foul Odor After Cleansing: No Exudate Color: red, brown Wound Bed Granulation Amount: Large (67-100%) Exposed Structure Granulation Quality: Red Fascia Exposed: No Necrotic Amount: Small (1-33%) Fat Layer Exposed: No Necrotic Quality: Adherent Slough Tendon Exposed: No Muscle Exposed: No Joint Exposed: No Bone Exposed: No Limited to Skin Breakdown Periwound Skin Texture Texture Color No Abnormalities Noted: No No Abnormalities Noted: No Localized Edema: Yes Temperature / Pain Moisture Temperature: No Abnormality No Abnormalities Noted: No Tenderness on Palpation: Yes Moist: Yes Wound Preparation Ulcer Cleansing: Rinsed/Irrigated with Saline Topical Anesthetic Applied: Other: lidocaine 4%, Treatment Notes Wound #1 (Right, Lateral Lower Leg) 1. Cleansed with: Clean wound with Normal Saline 2. Anesthetic Topical Lidocaine 4% cream to wound bed prior to debridement 3. Peri-wound Care: Skin Prep 4. Dressing Applied: Aquacel Ag 5. Secondary Dressing Applied Bordered Foam Dressing Electronic Signature(s) Signed: 11/08/2015 4:39:30 PM By: Alric Quan Entered By: Alric Quan on 11/08/2015 12:57:03 Seth Mcintosh ([id number]) -------------------------------------------------------------------------------- [place] Details Patient Name: Seth Mcintosh Date of Service: 11/08/2015 12:45 PM Medical Record Number: [id number] Patient Account Number: [id number] Date of Birth/Sex: [birth year] (75 y.o. Male) Treating RN: Ahmed Prima Primary Care Physician: [facility], [place] Other Clinician: Referring Physician: [facility], [facility] Treating Physician/Extender: Frann Rider in Treatment: 7 Vital Signs Time Taken: 12:50 Temperature (F): 97.7 Height (in): 74 Pulse (bpm): 78 Weight  (lbs): 265 Respiratory Rate (breaths/min): 18 Body Mass Index (BMI): 34 Blood Pressure (mmHg): 152/62 Reference Range: 80 - 120 mg / dl Electronic Signature(s) Signed: 11/08/2015 4:39:30 PM By: Alric Quan Entered By: Alric Quan on 11/08/2015 12:57:52

## 2015-11-09 NOTE — Progress Notes (Signed)
PAGE, RAMAGLIA ([id number]) Visit Report for 11/08/2015 Chief Complaint Document Details Patient Name: Seth Mcintosh Date of Service: 11/08/2015 12:45 PM Medical Record Number: [id number] Patient Account Number: [id number] Date of Birth/Sex: 03-10-1941 (75 y.o. Male) Treating RN: Ahmed Prima Primary Care Physician: [facility], Dossie Der Other Clinician: Referring Physician: Keith Rake Treating Physician/Extender: Frann Rider in Treatment: 7 Information Obtained from: Patient Chief Complaint Patient seen for complaints of Non-Healing Wound which she's had on the right lower extremity for about 5 weeks Electronic Signature(s) Signed: 11/08/2015 1:26:22 PM By: Christin Fudge MD, FACS Entered By: Christin Fudge on 11/08/2015 13:26:21 Seth Mcintosh ([id number]) -------------------------------------------------------------------------------- HPI Details Patient Name: Seth Mcintosh Date of Service: 11/08/2015 12:45 PM Medical Record Number: [id number] Patient Account Number: [id number] Date of Birth/Sex: 1940/06/04 (75 y.o. Male) Treating RN: Ahmed Prima Primary Care Physician: [facility], Dossie Der Other Clinician: Referring Physician: [facility], Dossie Der Treating Physician/Extender: Frann Rider in Treatment: 7 History of Present Illness Location: open wound right lower extremity lateral leg Quality: Patient reports experiencing a dull pain to affected area(s). Severity: Patient states wound are getting worse. Duration: Patient has had the wound for > 6 weeks prior to seeking treatment at the wound center Timing: Pain in wound is Intermittent (comes and goes Context: The wound occurred when the patient had a injury with a blunt object around 08/07/2015 Modifying Factors: a large swelling with drainage Associated Signs and Symptoms: Patient reports having increase swelling. HPI Description: 75 year old gentleman with known history of diabetes mellitus without complication had  a lacerated wound to his right lower extremity which was repaired about 4 weeks ago. Was seen by his PCP Dr. Donneta Romberg with a open wound on the right lateral leg with some discharge and erythema and he was placed on a blocks and local dressing was applied and referred to [place] for further care. Past medical history significant for diabetes mellitus type 2, coronary artery disease, hypertension, arthritis, depression, foot surgery o2, back surgery, CABG, TURP, right total knee replacement. He is a former smoker and quit in 1994. On questioning the patient he gives a history that he had a blunt injury to his right leg and this happened on April 15 of this year and about 2 weeks later he had blood draining from here and had to go to the ER and have sutures placed. However on looking at his medical records he was in the ER on 05/17/2015 with a blunt injury on his right lower extremity and at that stage x-ray showed no fracture and the right lower leg ultrasound showed no evidence of DVT. Diagnosis of a hematoma was made and he was given symptomatic treatment for this. The next time he was seen in the emergency room was on 08/11/2015 when the patient had a 10 cm laceration to the right lateral leg and this was sutured by the ER physician Dr. Dahlia Client. His blood alcohol level was 162 mg percent that day and the sutures were to be removed in 10 days' time. 09/23/2015 -- had his ultrasound of the left lower extremity for possible hematoma and the impression was there was no discrete drainable hematoma in the area of clinical concern on the left lower extremity. On reviewing his history he clearly states that he never had a large lacerated wound due to the injury or fall on April 19. He says he did have a few drinks but has not drunk and the hematoma which he had for a couple of months just burst open and caused  the big bleed and extrusion of clots. Electronic Signature(s) Signed: 11/08/2015 1:26:30 PM By:  Christin Fudge MD, FACS Entered By: Christin Fudge on 11/08/2015 13:26:29 Seth Mcintosh ([id number]) -------------------------------------------------------------------------------- Physical Exam Details Patient Name: Seth Mcintosh Date of Service: 11/08/2015 12:45 PM Medical Record Number: [id number] Patient Account Number: [id number] Date of Birth/Sex: [birth year] (75 y.o. Male) Treating RN: Ahmed Prima Primary Care Physician: [facility], Dossie Der Other Clinician: Referring Physician: [facility], [facility] Treating Physician/Extender: Frann Rider in Treatment: 7 Constitutional . Pulse regular. Respirations normal and unlabored. Afebrile. . Eyes Nonicteric. Reactive to light. Ears, Nose, Mouth, and Throat Lips, teeth, and gums WNL.Marland Kitchen Moist mucosa without lesions. Neck supple and nontender. No palpable supraclavicular or cervical adenopathy. Normal sized without goiter. Respiratory WNL. No retractions.. Cardiovascular Pedal Pulses WNL. No clubbing, cyanosis or edema. Lymphatic No adneopathy. No adenopathy. No adenopathy. Musculoskeletal Adexa without tenderness or enlargement.. Digits and nails w/o clubbing, cyanosis, infection, petechiae, ischemia, or inflammatory conditions.. Integumentary (Hair, Skin) No suspicious lesions. No crepitus or fluctuance. No peri-wound warmth or erythema. No masses.Marland Kitchen Psychiatric Judgement and insight Intact.. No evidence of depression, anxiety, or agitation.. Notes the wound continues to have depth towards the 6:00 position and the base is clean and there is no seropurulent material. There is no surrounding cellulitis. No debridement was required today. Electronic Signature(s) Signed: 11/08/2015 1:27:46 PM By: Christin Fudge MD, FACS Entered By: Christin Fudge on 11/08/2015 13:27:45 Seth Mcintosh ([id number]) -------------------------------------------------------------------------------- Physician Orders Details Patient Name: Seth Mcintosh Date of Service: 11/08/2015 12:45 PM Medical Record Number: [id number] Patient Account Number: [id number] Date of Birth/Sex: [birth year] (75 y.o. Male) Treating RN: Ahmed Prima Primary Care Physician: [facility], Dossie Der Other Clinician: Referring Physician: [facility], Dossie Der Treating Physician/Extender: Frann Rider in Treatment: 7 Verbal / Phone Orders: Yes ClinicianCarolyne Fiscal, Debi Read Back and Verified: Yes Diagnosis Coding Wound Cleansing Wound #1 Right,Lateral Lower Leg o Cleanse wound with mild soap and water o May Shower, gently pat wound dry prior to applying new dressing. Anesthetic Wound #1 Right,Lateral Lower Leg o Topical Lidocaine 4% cream applied to wound bed prior to debridement Skin Barriers/Peri-Wound Care Wound #1 Right,Lateral Lower Leg o Skin Prep Primary Wound Dressing Wound #1 Right,Lateral Lower Leg o Aquacel Ag - rope Secondary Dressing Wound #1 Right,Lateral Lower Leg o Boardered Foam Dressing Dressing Change Frequency Wound #1 Right,Lateral Lower Leg o Change dressing every day. Edema Control Wound #1 Right,Lateral Lower Leg o Elevate legs to the level of the heart and pump ankles as often as possible o Support Garment 20-30 mm/Hg pressure to: - wear compression stockings Additional Orders / Instructions Wound #1 Right,Lateral Lower Leg o Increase protein intake. Seth, Mcintosh ([id number]) Electronic Signature(s) Signed: 11/08/2015 3:43:28 PM By: Christin Fudge MD, FACS Signed: 11/08/2015 4:39:30 PM By: Alric Quan Entered By: Alric Quan on 11/08/2015 13:05:05 Seth Mcintosh ([id number]) -------------------------------------------------------------------------------- Problem List Details Patient Name: Seth Mcintosh Date of Service: 11/08/2015 12:45 PM Medical Record Number: [id number] Patient Account Number: [id number] Date of Birth/Sex: [birth year] (75 y.o. 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 Male) Treating RN: Ahmed Prima Primary Care Physician: [facility], Dossie Der Other Clinician: Referring Physician: [facility], Dossie Der Treating Physician/Extender: Frann Rider in Treatment: 7 Subjective Chief Complaint Information obtained from Patient Patient seen for complaints of Non-Healing Wound which she's had on the right lower extremity for about 5 weeks History of Present Illness (HPI) The following HPI elements were documented for the patient's wound: Location: open wound right lower extremity lateral leg Quality: Patient reports experiencing a dull pain to affected area(s). Severity: Patient states wound are getting worse. Duration: Patient has had the wound for > 6 weeks prior to seeking treatment at the wound center Timing: Pain in wound is Intermittent (comes and goes Context: The wound occurred when the patient had a injury with a blunt object around 08/07/2015 Modifying Factors: a large swelling with drainage Associated Signs and Symptoms:  Patient reports having increase swelling. 75 year old gentleman with known history of diabetes mellitus without complication had a lacerated wound to his right lower extremity which was repaired about 4 weeks ago. Was seen by his PCP Dr. Donneta Romberg with a open wound on the right lateral leg with some discharge and erythema and he was placed on a blocks and local dressing was applied and referred to [place] for further care. Past medical history significant for diabetes mellitus type 2, coronary artery disease, hypertension, arthritis, depression, foot surgery o2, back surgery, CABG, TURP, right total knee replacement. He is a former smoker and quit in 1994. On questioning the patient he gives a history that he had a blunt injury to his right leg and this happened on April 15 of this year and about 2 weeks later he had blood draining from here and had to go to the ER and have sutures placed. However on looking at his medical records he was in the ER on 05/17/2015 with a blunt injury on his right lower extremity and at that stage x-ray showed no fracture and the right lower leg ultrasound showed no evidence of DVT. Diagnosis of a hematoma was made and he was given symptomatic treatment for this. The next time he was seen in the emergency room was on 08/11/2015 when the patient had a 10 cm laceration to the right lateral leg and this was sutured by the ER physician Dr. Dahlia Client. His blood alcohol level was 162 mg percent that day and the sutures were to be removed in 10 days' time. 09/23/2015 -- had his ultrasound of the left lower extremity for possible hematoma and the impression was there was no discrete drainable hematoma in the area of clinical concern on the left lower extremity. On reviewing his history he clearly states that he never had a large lacerated wound due to the injury or fall on April 19. He says he did have a few drinks but has not drunk and the hematoma which he had for a MJ, WALT. ([id number]) couple of months just burst open and caused the big bleed and extrusion of clots. Objective Constitutional Pulse regular. Respirations normal and unlabored. Afebrile. Vitals Time Taken: 12:50 PM, Height: 74 in, Weight: 265 lbs, BMI: 34, Temperature: 97.7 F, Pulse: 78 bpm, Respiratory Rate: 18 breaths/min, Blood Pressure: 152/62 mmHg. Eyes Nonicteric. Reactive to light. Ears,  Nose, Mouth, and Throat Lips, teeth, and gums WNL.Marland Kitchen Moist mucosa without lesions. Neck supple and nontender. No palpable supraclavicular or cervical adenopathy. Normal sized without goiter. Respiratory WNL. No retractions.. Cardiovascular Pedal Pulses WNL. No clubbing, cyanosis or edema. Lymphatic No adneopathy. No adenopathy. No adenopathy. Musculoskeletal Adexa without tenderness or enlargement.. Digits and nails w/o clubbing, cyanosis, infection, petechiae, ischemia, or inflammatory conditions.Marland Kitchen Psychiatric Judgement and insight Intact.. No evidence of depression, anxiety, or agitation.. General Notes: the wound continues to have depth towards the 6:00 position and the base is clean and there is no seropurulent material. There is no surrounding cellulitis. No debridement was required today. Integumentary (Hair, Skin) No suspicious lesions. No crepitus or fluctuance. No peri-wound warmth or erythema. No masses.. Wound #1 status is Open. Original cause of wound was Trauma. The wound is located on the Right,Lateral Lower Leg. The wound measures 1.8cm length x 1cm width x 0.3cm depth; 1.414cm^2 area and Seth Mcintosh, Seth Mcintosh. ([id number]) 0.424cm^3 volume. The wound is limited to skin breakdown. There is no tunneling noted, however, there is undermining starting at 2:00 and ending at 6:00 with a maximum distance of 2.2cm. There is a large amount of serosanguineous drainage noted. The wound margin is thickened. There is large (67-100%) red granulation within the wound bed. There is a small (1-33%)  amount of necrotic tissue within the wound bed including Adherent Slough. The periwound skin appearance exhibited: Localized Edema, Moist. Periwound temperature was noted as No Abnormality. The periwound has tenderness on palpation. 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 Anesthetic: Wound #1 Right,Lateral Lower Leg: Topical Lidocaine 4% cream applied to wound bed prior to debridement Skin Barriers/Peri-Wound Care: Wound #1 Right,Lateral Lower Leg: Skin Prep Primary Wound Dressing: Wound #1 Right,Lateral Lower Leg: Aquacel Ag - rope Secondary Dressing: Wound #1 Right,Lateral Lower Leg: Boardered Foam Dressing Dressing Change Frequency: Wound #1 Right,Lateral Lower Leg: Change dressing every day. Edema Control: Wound #1 Right,Lateral Lower Leg: Elevate legs to the level of the heart and pump ankles as often as possible Support Garment 20-30 mm/Hg pressure to: - wear compression stockings Additional Orders / Instructions: Seth Mcintosh, Seth Mcintosh ([id number]) Wound #1 Right,Lateral Lower Leg: Increase protein intake. We will continue with light packing of silver alginate and continue local care. 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 Using a wound VAC at this stage( like a snap) was discussed with him but he prefers to continue with consultative care. Electronic Signature(s) Signed: 11/08/2015 1:29:13 PM By: Christin Fudge MD, FACS Entered By: Christin Fudge on  11/08/2015 13:29:12 Seth Mcintosh ([id number]) -------------------------------------------------------------------------------- SuperBill Details Patient Name: Seth Mcintosh Date of Service: 11/08/2015 Medical Record Number: [id number] Patient Account Number: [id number] Date of Birth/Sex: Sep 29, 1940 (75 y.o. Male) Treating RN: Ahmed Prima Primary Care Physician: [facility], Dossie Der Other Clinician: Referring Physician: [facility], Dossie Der Treating Physician/Extender: Frann Rider in Treatment: 7 Diagnosis Coding ICD-10 Codes Code Description E11.622 Type 2 diabetes mellitus with other skin ulcer L97.213 Non-pressure chronic ulcer of right calf with necrosis  of muscle M70.861 Other soft tissue disorders related to use, overuse and pressure, right lower leg Facility Procedures CPT4 Code: [id number] Description: 99213 - WOUND CARE VISIT-LEV 3 EST PT Modifier: Quantity: 1 Physician Procedures CPT4: Description Modifier Quantity Code [id number] - WC PHYS LEVEL 3 - EST PT 1 ICD-10 Description Diagnosis E11.622 Type 2 diabetes mellitus with other skin ulcer L97.213 Non-pressure chronic ulcer of right calf with necrosis of muscle M70.861 Other  soft tissue disorders related to use, overuse and pressure, right lower leg Electronic Signature(s) Signed: 11/08/2015 4:39:30 PM By: Alric Quan Previous Signature: 11/08/2015 1:29:34 PM Version By: Christin Fudge MD, FACS Previous Signature: 11/08/2015 1:29:26 PM Version By: Christin Fudge MD, FACS Entered By: Alric Quan on 11/08/2015 16:33:00

## 2015-11-10 ENCOUNTER — Ambulatory Visit: Payer: Medicare Other | Admitting: Family Medicine

## 2015-11-12 IMAGING — US US EXTREM LOW VENOUS*L*
1 series · 14 of 24 positions shown · non-contrast
Comparison: None

CLINICAL DATA: Left pain and swelling x2 weeks. Remote history of
DVT

EXAM:
LEFT LOWER EXTREMITY VENOUS DOPPLER ULTRASOUND
TECHNIQUE: Gray-scale sonography with compression, as well as color and duplex
ultrasound, were performed to evaluate the deep venous system from
the level of the common femoral vein through the popliteal and
proximal calf veins.

[Series 1: us extrem low venous*left* · 0.09mm/px · 14 of 34 slices shown]
[im 1/34]
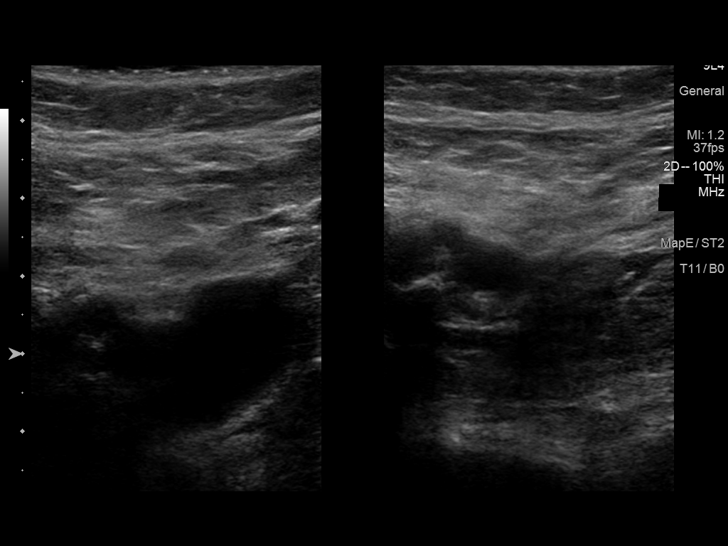
[im 3/34]
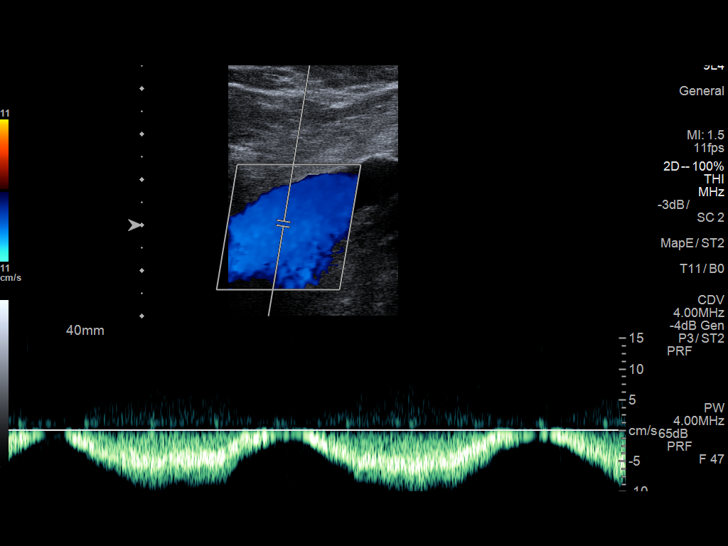
[im 6/34]
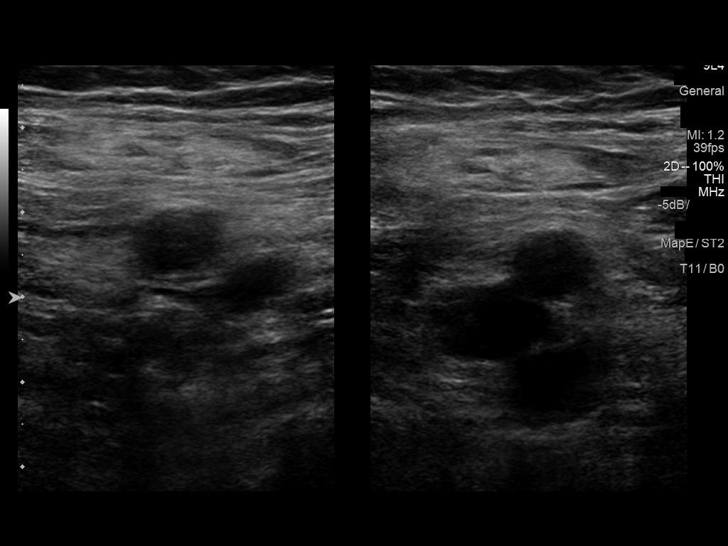
[im 9/34]
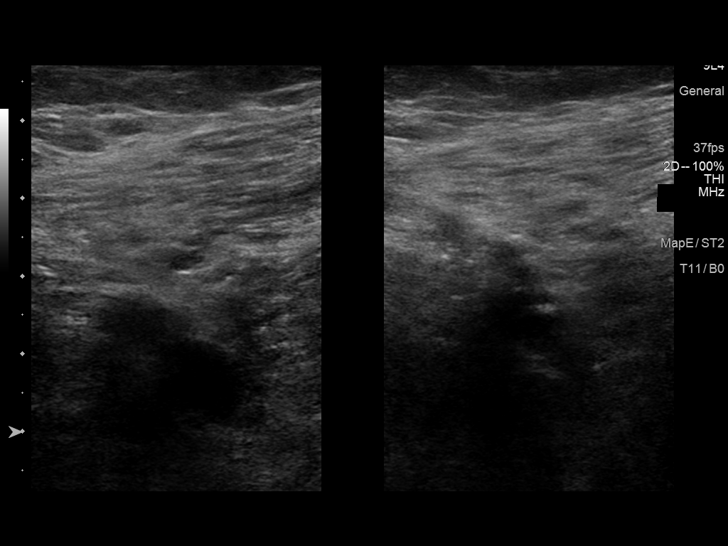
[im 11/34]
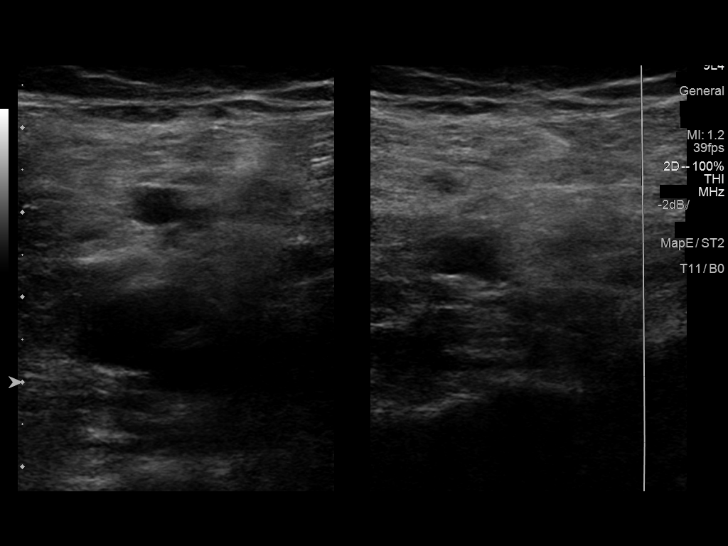
[im 13/34]
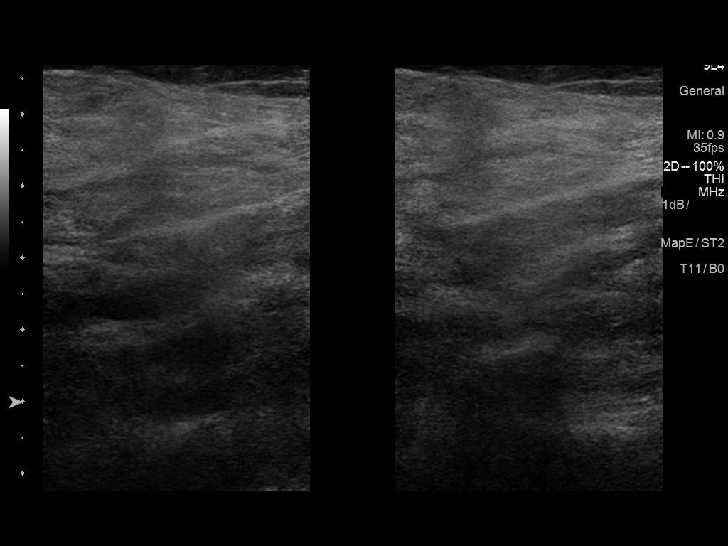
[im 16/34]
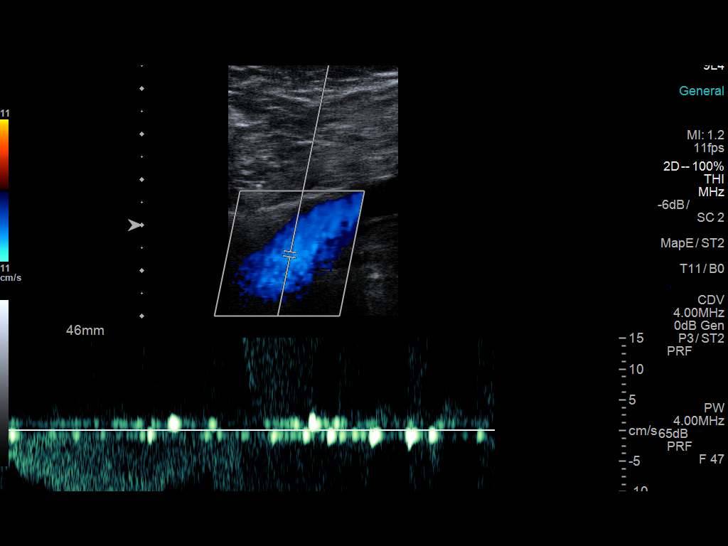
[im 18/34]
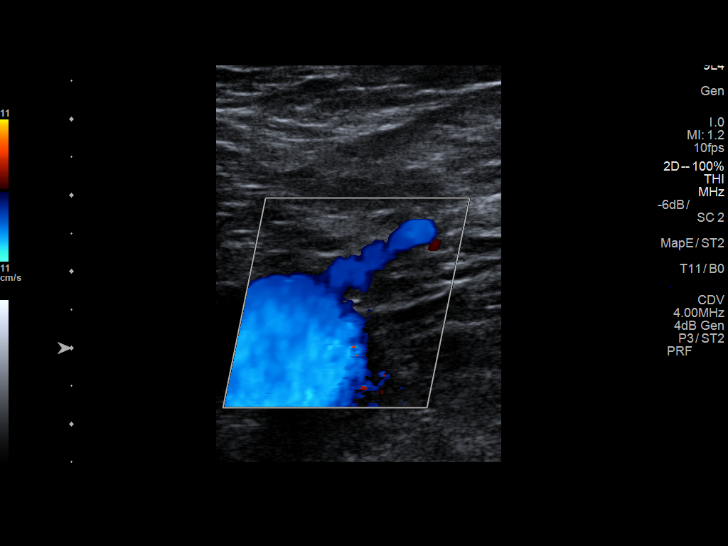
[im 21/34]
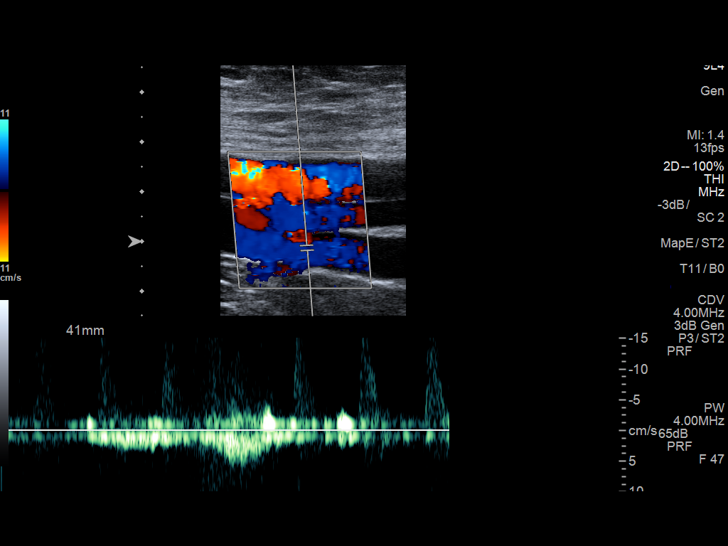
[im 23/34]
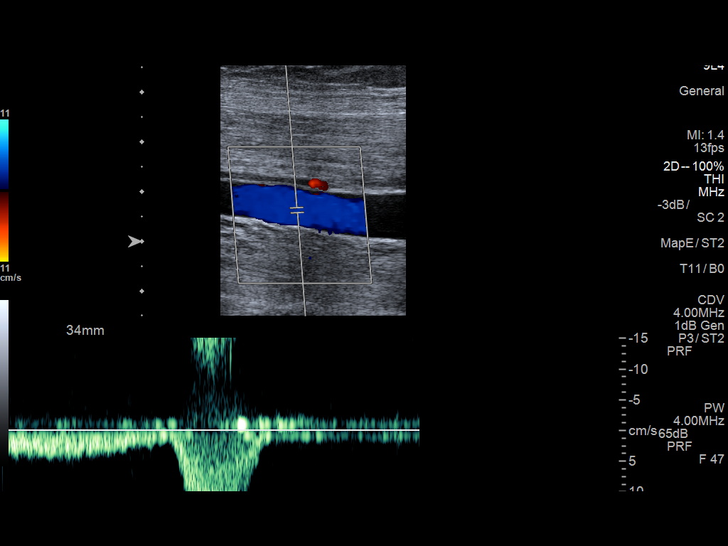
[im 26/34]
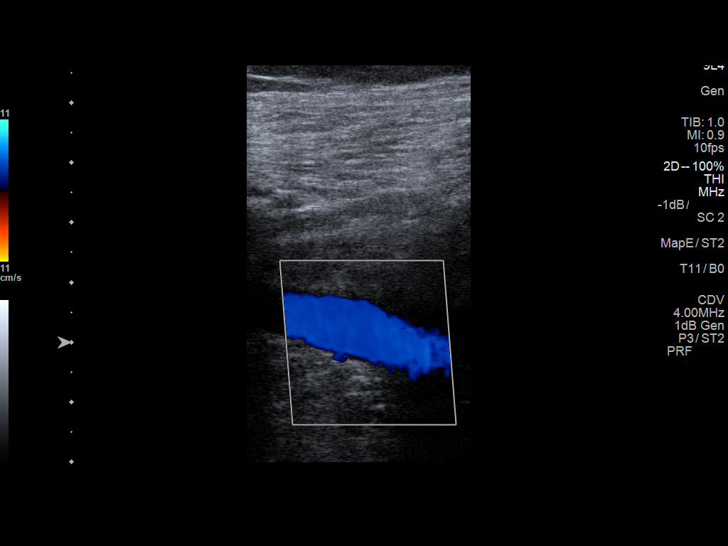
[im 28/34]
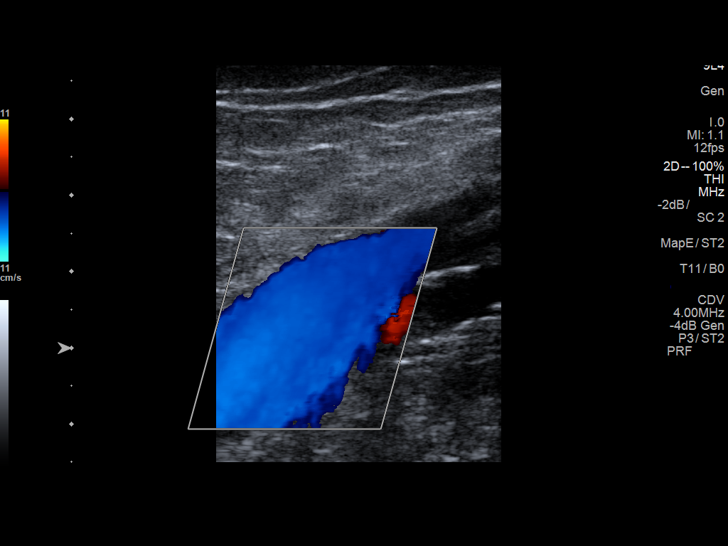
[im 31/34]
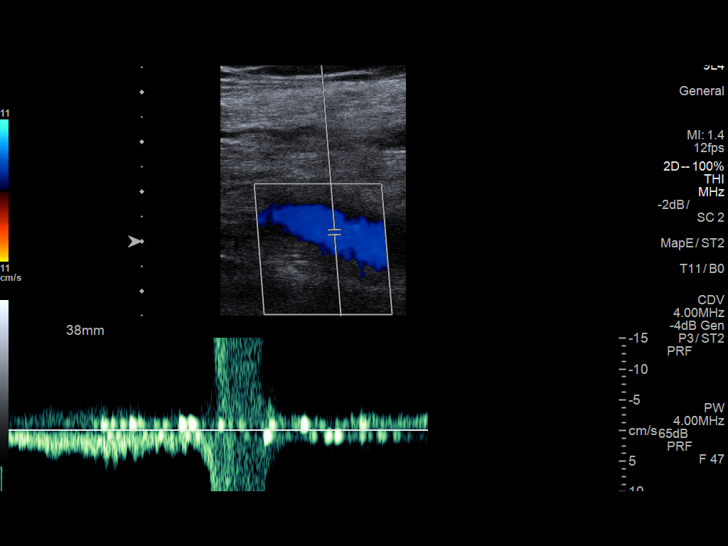
[im 34/34]
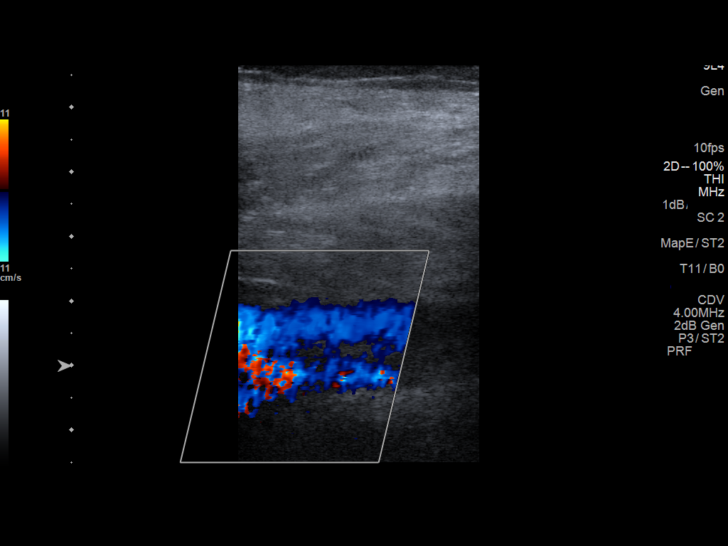

[14 of 24 positions shown; findings below may reference images not displayed]

FINDINGS: Normal compressibility of the common femoral, superficial femoral,
and popliteal veins, as well as the proximal calf veins. No filling
defects to suggest DVT on grayscale or color Doppler imaging.
Doppler waveforms show normal direction of venous flow, normal
respiratory phasicity and response to augmentation. Survey views of
the contralateral common femoral vein are unremarkable.
IMPRESSION: 1. No evidence of lower extremity deep vein thrombosis, LEFT

## 2015-11-15 ENCOUNTER — Encounter: Payer: Medicare Other | Admitting: Surgery

## 2015-11-15 DIAGNOSIS — I10 Essential (primary) hypertension: Secondary | ICD-10-CM | POA: Diagnosis not present

## 2015-11-15 DIAGNOSIS — Z87891 Personal history of nicotine dependence: Secondary | ICD-10-CM | POA: Diagnosis not present

## 2015-11-15 DIAGNOSIS — L97213 Non-pressure chronic ulcer of right calf with necrosis of muscle: Secondary | ICD-10-CM | POA: Diagnosis not present

## 2015-11-15 DIAGNOSIS — M70861 Other soft tissue disorders related to use, overuse and pressure, right lower leg: Secondary | ICD-10-CM | POA: Diagnosis not present

## 2015-11-15 DIAGNOSIS — S81811A Laceration without foreign body, right lower leg, initial encounter: Secondary | ICD-10-CM | POA: Diagnosis not present

## 2015-11-15 DIAGNOSIS — I2510 Atherosclerotic heart disease of native coronary artery without angina pectoris: Secondary | ICD-10-CM | POA: Diagnosis not present

## 2015-11-15 DIAGNOSIS — E11622 Type 2 diabetes mellitus with other skin ulcer: Secondary | ICD-10-CM | POA: Diagnosis not present

## 2015-11-16 NOTE — Progress Notes (Signed)
Seth, Mcintosh ([id number]) Visit Report for 11/15/2015 Arrival Information Details Patient Name: Seth Mcintosh, Seth Mcintosh Date of Service: 11/15/2015 1:30 PM Medical Record Number: [id number] Patient Account Number: [id number] Date of Birth/Sex: 28-Jun-1940 (75 y.o. Male) Treating RN: Ahmed Prima Primary Care Physician: [facility], Dossie Der Other Clinician: Referring Physician: [facility], [place] Der Treating Physician/Extender: Frann Rider in Treatment: 8 Visit Information History Since Last Visit All ordered tests and consults were completed: No Patient Arrived: Ambulatory Added or deleted any medications: No Arrival Time: 13:42 Any new allergies or adverse reactions: No Accompanied By: self Had a fall or experienced change in No Transfer Assistance: None activities of daily living that may affect Patient Identification Verified: Yes risk of falls: Secondary Verification Process Yes Signs or symptoms of abuse/neglect since last No Completed: visito Patient Requires Transmission-Based No Hospitalized since last visit: No Precautions: Pain Present Now: No Patient Has Alerts: Yes Patient Alerts: DM II Electronic Signature(s) Signed: 11/15/2015 5:19:00 PM By: Alric Quan Entered By: Alric Quan on 11/15/2015 13:43:53 Seth Mcintosh ([id number]) -------------------------------------------------------------------------------- Clinic Level of Care Assessment Details Patient Name: Seth Mcintosh Date of Service: 11/15/2015 1:30 PM Medical Record Number: [id number] Patient Account Number: [id number] Date of Birth/Sex: December 05, 1940 (75 y.o. Male) Treating RN: Ahmed Prima Primary Care Physician: [facility] [place]oss[place] Der Other Clinician: Referring Physician: [facility], [facility] Treating Physician/Extender: Frann Rider in Treatment: 8 Clinic Level of Care Assessment Items TOOL 4 Quantity Score X - Use when only an EandM is performed on FOLLOW-UP visit 1 0 ASSESSMENTS - Nursing Assessment /  Reassessment X - Reassessment of Co-morbidities (includes updates in patient status) 1 10 X - Reassessment of Adherence to Treatment Plan 1 5 ASSESSMENTS - Wound and Skin Assessment / Reassessment X - Simple Wound Assessment / Reassessment - one wound 1 5 []  - Complex Wound Assessment / Reassessment - multiple wounds 0 []  - Dermatologic / Skin Assessment (not related to wound area) 0 ASSESSMENTS - Focused Assessment []  - Circumferential Edema Measurements - multi extremities 0 []  - Nutritional Assessment / Counseling / Intervention 0 []  - Lower Extremity Assessment (monofilament, tuning fork, pulses) 0 []  - Peripheral Arterial Disease Assessment (using hand held doppler) 0 ASSESSMENTS - Ostomy and/or Continence Assessment and Care []  - Incontinence Assessment and Management 0 []  - Ostomy Care Assessment and Management (repouching, etc.) 0 PROCESS - Coordination of Care X - Simple Patient / Family Education for ongoing care 1 15 []  - Complex (extensive) Patient / Family Education for ongoing care 0 []  - Staff obtains Programmer, systems, Records, Test Results / Process Orders 0 []  - Staff telephones HHA, Nursing Homes / Clarify orders / etc 0 []  - Routine Transfer to another Facility (non-emergent condition) 0 BRANDONMICHAEL, GHENT ([id number]) []  - Routine Hospital Admission (non-emergent condition) 0 []  - New Admissions / Biomedical engineer / Ordering NPWT, Apligraf, etc. 0 []  - Emergency Hospital Admission (emergent condition) 0 X - Simple Discharge Coordination 1 10 []  - Complex (extensive) Discharge Coordination 0 PROCESS - Special Needs []  - Pediatric / Minor Patient Management 0 []  - Isolation Patient Management 0 []  - Hearing / Language / Visual special needs 0 []  - Assessment of Community assistance (transportation, D/C planning, etc.) 0 []  - Additional assistance / Altered mentation 0 []  - Support Surface(s) Assessment (bed, cushion, seat, etc.) 0 INTERVENTIONS - Wound Cleansing /  Measurement X - Simple Wound Cleansing - one wound 1 5 []  - Complex Wound Cleansing - multiple wounds 0 X - Wound Imaging (photographs - any number of  wounds) 1 5 []  - Wound Tracing (instead of photographs) 0 []  - Simple Wound Measurement - one wound 0 X - Complex Wound Measurement - multiple wounds 1 5 INTERVENTIONS - Wound Dressings []  - Small Wound Dressing one or multiple wounds 0 X - Medium Wound Dressing one or multiple wounds 1 15 []  - Large Wound Dressing one or multiple wounds 0 []  - Application of Medications - topical 0 []  - Application of Medications - injection 0 INTERVENTIONS - Miscellaneous []  - External ear exam 0 REYNOL, SAMONS ([id number]) []  - Specimen Collection (cultures, biopsies, blood, body fluids, etc.) 0 []  - Specimen(s) / Culture(s) sent or taken to Lab for analysis 0 []  - Patient Transfer (multiple staff / Harrel Lemon Lift / Similar devices) 0 []  - Simple Staple / Suture removal (25 or less) 0 []  - Complex Staple / Suture removal (26 or more) 0 []  - Hypo / Hyperglycemic Management (close monitor of Blood Glucose) 0 []  - Ankle / Brachial Index (ABI) - do not check if billed separately 0 X - Vital Signs 1 5 Has the patient been seen at the hospital within the last three years: Yes Total Score: 80 Level Of Care: New/Established - Level 3 Electronic Signature(s) Signed: 11/15/2015 5:19:00 PM By: Alric Quan Entered By: Alric Quan on 11/15/2015 14:15:30 Seth Mcintosh ([id number]) -------------------------------------------------------------------------------- Encounter Discharge Information Details Patient Name: Seth Mcintosh Date of Service: 11/15/2015 1:30 PM Medical Record Number: [id number] Patient Account Number: [id number] Date of Birth/Sex: 1940-05-24 (75 y.o. Male) Treating RN: Ahmed Prima Primary Care Physician: [facility], Dossie Der Other Clinician: Referring Physician: [facility], [facility] Treating Physician/Extender: Frann Rider in  Treatment: 8 Encounter Discharge Information Items Discharge Pain Level: 0 Discharge Condition: Stable Ambulatory Status: Ambulatory Discharge Destination: Home Transportation: Private Auto Accompanied By: self Schedule Follow-up Appointment: Yes Medication Reconciliation completed and provided to Patient/Care Yes Bellany Elbaum: Provided on Clinical Summary of Care: 11/15/2015 Form Type Recipient Paper Patient JK Electronic Signature(s) Signed: 11/15/2015 2:05:46 PM By: Ruthine Dose Entered By: Ruthine Dose on 11/15/2015 14:05:46 Seth Mcintosh ([id number]) -------------------------------------------------------------------------------- Lower Extremity Assessment Details Patient Name: Seth Mcintosh Date of Service: 11/15/2015 1:30 PM Medical Record Number: [id number] Patient Account Number: [id number] Date of Birth/Sex: [birth year] (75 y.o. Male) Treating RN: Ahmed Prima Primary Care Physician: [facility], [place]oss[place] Der Other Clinician: Referring Physician: [facility], [place] Treating Physician/Extender: Frann Rider in Treatment: 8 Vascular Assessment Pulses: Posterior Tibial Dorsalis Pedis Palpable: [Right:Yes] Extremity colors, hair growth, and conditions: Extremity Color: [Right:Normal] Temperature of Extremity: [Right:Warm] Capillary Refill: [Right:< 3 seconds] Electronic Signature(s) Signed: 11/15/2015 5:19:00 PM By: Alric Quan Entered By: Alric Quan on 11/15/2015 13:48:43 Seth Mcintosh ([id number]) -------------------------------------------------------------------------------- Multi Wound Chart Details Patient Name: Seth Mcintosh Date of Service: 11/15/2015 1:30 PM Medical Record Number: [id number] Patient Account Number: [id number] Date of Birth/Sex: Jul 07, 1940 (75 y.o. Male) Treating RN: Ahmed Prima Primary Care Physician: [facility], Dossie Der Other Clinician: Referring Physician: [facility], [facility] Treating Physician/Extender: Frann Rider in Treatment:  8 Vital Signs Height(in): 74 Pulse(bpm): 53 Weight(lbs): 265 Blood Pressure 129/65 (mmHg): Body Mass Index(BMI): 34 Temperature(F): 97.7 Respiratory Rate 18 (breaths/min): Photos: [1:No Photos] [N/A:N/A] Wound Location: [1:Right Lower Leg - Lateral] [N/A:N/A] Wounding Event: [1:Trauma] [N/A:N/A] Primary Etiology: [1:Trauma, Other] [N/A:N/A] Comorbid History: [1:Glaucoma, Sleep Apnea, Coronary Artery Disease, Hypertension, Type II Diabetes, Osteoarthritis] [N/A:N/A] Date Acquired: [1:08/07/2015] [N/A:N/A] Weeks of Treatment: [1:8] [N/A:N/A] Wound Status: [1:Open] [N/A:N/A] Measurements L x W x D 2x1x0.4 [N/A:N/A] (cm) Area (cm) : [1:1.571] [N/A:N/A] Volume (cm) : [1:0.628] [N/A:N/A] %  Reduction in Area: [1:60.00%] [N/A:N/A] % Reduction in Volume: 77.20% [N/A:N/A] Starting Position 1 2 (o'clock): Ending Position 1 [1:12] (o'clock): Maximum Distance 1 2.3 (cm): Undermining: [1:Yes] [N/A:N/A] Classification: [1:Full Thickness With Exposed Support Structures] [N/A:N/A] HBO Classification: [1:Grade 1] [N/A:N/A] Exudate Amount: [1:Large] [N/A:N/A] Exudate Type: [1:Serosanguineous] [N/A:N/A] Exudate Color: red, brown N/A N/A Wound Margin: Thickened N/A N/A Granulation Amount: Large (67-100%) N/A N/A Granulation Quality: Red N/A N/A Necrotic Amount: Small (1-33%) N/A N/A Exposed Structures: Fascia: No N/A N/A Fat: No Tendon: No Muscle: No Joint: No Bone: No Limited to Skin Breakdown Epithelialization: None N/A N/A Periwound Skin Texture: Edema: Yes N/A N/A Periwound Skin Moist: Yes N/A N/A Moisture: Periwound Skin Color: No Abnormalities Noted N/A N/A Temperature: No Abnormality N/A N/A Tenderness on Yes N/A N/A Palpation: Wound Preparation: Ulcer Cleansing: N/A N/A Rinsed/Irrigated with Saline Topical Anesthetic Applied: Other: lidocaine 4% Treatment Notes Electronic Signature(s) Signed: 11/15/2015 5:19:00 PM By: Alric Quan Entered By: Alric Quan on 11/15/2015 13:52:36 Seth Mcintosh ([id number]) -------------------------------------------------------------------------------- Multi-Disciplinary Care Plan Details Patient Name: Seth Mcintosh Date of Service: 11/15/2015 1:30 PM Medical Record Number: [id number] Patient Account Number: [id number] Date of Birth/Sex: 12/25/1940 (75 y.o. Male) Treating RN: Ahmed Prima Primary Care Physician: [facility], Dossie Der Other Clinician: Referring Physician: [facility], Dossie Der Treating Physician/Extender: Frann Rider in Treatment: 8 Active Inactive Abuse / Safety / Falls / Self Care Management Nursing Diagnoses: Potential for falls Goals: Patient will remain injury free Date Initiated: 09/16/2015 Goal Status: Active Interventions: Assess fall risk on admission and as needed Notes: Orientation to the Wound Care Program Nursing Diagnoses: Knowledge deficit related to the wound healing center program Goals: Patient/caregiver will verbalize understanding of the [place] Program Date Initiated: 09/16/2015 Goal Status: Active Interventions: Provide education on orientation to the wound center Notes: Pain, Acute or Chronic Nursing Diagnoses: Pain, acute or chronic: actual or potential Potential alteration in comfort, pain Goals: SHAHZEB, BAYAT ([id number]) Patient will verbalize adequate pain control and receive pain control interventions during procedures as needed Date Initiated: 09/16/2015 Goal Status: Active Interventions: Assess comfort goal upon admission Complete pain assessment as per visit requirements Notes: Soft Tissue Infection Nursing Diagnoses: Impaired tissue integrity Goals: Patient will remain free of wound infection Date Initiated: 09/16/2015 Goal Status: Active Interventions: Assess signs and symptoms of infection every visit Notes: Wound/Skin Impairment Nursing Diagnoses: Impaired tissue integrity Goals: Ulcer/skin breakdown will have a  volume reduction of 30% by week 4 Date Initiated: 09/16/2015 Goal Status: Active Ulcer/skin breakdown will have a volume reduction of 50% by week 8 Date Initiated: 09/16/2015 Goal Status: Active Ulcer/skin breakdown will have a volume reduction of 80% by week 12 Date Initiated: 09/16/2015 Goal Status: Active Interventions: Assess patient/caregiver ability to obtain necessary supplies Assess ulceration(s) every visit Notes: HARTWELL, BURKEEN ([id number]) Electronic Signature(s) Signed: 11/15/2015 5:19:00 PM By: Alric Quan Entered By: Alric Quan on 11/15/2015 13:52:28 Seth Mcintosh ([id number]) -------------------------------------------------------------------------------- Pain Assessment Details Patient Name: Seth Mcintosh Date of Service: 11/15/2015 1:30 PM Medical Record Number: [id number] Patient Account Number: [id number] Date of Birth/Sex: 20-May-1940 (75 y.o. Male) Treating RN: Ahmed Prima Primary Care Physician: [facility], Dossie Der Other Clinician: Referring Physician: [facility], Dossie Der Treating Physician/Extender: Frann Rider in Treatment: 8 Active Problems Location of Pain Severity and Description of Pain Patient Has Paino No Site Locations With Dressing Change: No Pain Management and Medication Current Pain Management: Electronic Signature(s) Signed: 11/15/2015 5:19:00 PM By: Alric Quan Entered By: Alric Quan on 11/15/2015 13:44:02 Seth Mcintosh ([id number]) --------------------------------------------------------------------------------  Patient/Caregiver Education Details Patient Name: BRYLEN, COLLENS Date of Service: 11/15/2015 1:30 PM Medical Record Number: [id number] Patient Account Number: [id number] Date of Birth/Gender: [birth year] (75 y.o. Male) Treating RN: Ahmed Prima Primary Care Physician: [facility], [facility] Other Clinician: Referring Physician: [facility], Dossie Der Treating Physician/Extender: Frann Rider in Treatment:  8 Education Assessment Education Provided To: Patient Education Topics Provided Wound/Skin Impairment: Handouts: Other: change dressing as ordered Methods: Demonstration, Explain/Verbal Responses: State content correctly Electronic Signature(s) Signed: 11/15/2015 5:19:00 PM By: Alric Quan Entered By: Alric Quan on 11/15/2015 13:59:29 Seth Mcintosh ([id number]) -------------------------------------------------------------------------------- Wound Assessment Details Patient Name: Seth Mcintosh Date of Service: 11/15/2015 1:30 PM Medical Record Number: [id number] Patient Account Number: [id number] Date of Birth/Sex: 02-Feb-1941 (75 y.o. Male) Treating RN: Ahmed Prima Primary Care Physician: [facility], Dossie Der Other Clinician: Referring Physician: [facility], Dossie Der Treating Physician/Extender: Frann Rider in Treatment: 8 Wound Status Wound Number: 1 Primary Trauma, Other Etiology: Wound Location: Right Lower Leg - Lateral Wound Open Wounding Event: Trauma Status: Date Acquired: 08/07/2015 Comorbid Glaucoma, Sleep Apnea, Coronary Weeks Of Treatment: 8 History: Artery Disease, Hypertension, Type II Clustered Wound: No Diabetes, Osteoarthritis Photos Photo Uploaded By: Alric Quan on 11/15/2015 14:21:32 Wound Measurements Length: (cm) 2 % Reduction in Width: (cm) 1 % Reduction in Depth: (cm) 0.4 Epithelializat Area: (cm) 1.571 Tunneling: Volume: (cm) 0.628 Undermining: Starting Po Ending Posi Maximum Dis Area: 60% Volume: 77.2% ion: None No Yes sition (o'clock): 2 tion (o'clock): 12 tance: (cm) 2.3 Wound Description Full Thickness With Exposed Classification: Support Structures Diabetic Severity Grade 1 (Wagner): Wound Margin: Thickened Exudate Amount: Large Exudate Type: Serosanguineous ERYN, STALLINS ([id number]) Foul Odor After Cleansing: No Exudate Color: red, brown Wound Bed Granulation Amount: Large (67-100%) Exposed  Structure Granulation Quality: Red Fascia Exposed: No Necrotic Amount: Small (1-33%) Fat Layer Exposed: No Necrotic Quality: Adherent Slough Tendon Exposed: No Muscle Exposed: No Joint Exposed: No Bone Exposed: No Limited to Skin Breakdown Periwound Skin Texture Texture Color No Abnormalities Noted: No No Abnormalities Noted: No Localized Edema: Yes Temperature / Pain Moisture Temperature: No Abnormality No Abnormalities Noted: No Tenderness on Palpation: Yes Moist: Yes Wound Preparation Ulcer Cleansing: Rinsed/Irrigated with Saline Topical Anesthetic Applied: Other: lidocaine 4%, Treatment Notes Wound #1 (Right, Lateral Lower Leg) 1. Cleansed with: Clean wound with Normal Saline 2. Anesthetic Topical Lidocaine 4% cream to wound bed prior to debridement 3. Peri-wound Care: Skin Prep 4. Dressing Applied: Other dressing (specify in notes) 5. Secondary Dressing Applied Bordered Foam Dressing Notes RTD Electronic Signature(s) Signed: 11/15/2015 5:19:00 PM By: Alric Quan Entered By: Alric Quan on 11/15/2015 13:51:48 Seth Mcintosh ([id number]) -------------------------------------------------------------------------------- [place] Details Patient Name: Seth Mcintosh Date of Service: 11/15/2015 1:30 PM Medical Record Number: [id number] Patient Account Number: [id number] Date of Birth/Sex: 09-22-1940 (75 y.o. Male) Treating RN: Ahmed Prima Primary Care Physician: [facility], [place] Other Clinician: Referring Physician: [facility], [facility] Treating Physician/Extender: Frann Rider in Treatment: 8 Vital Signs Time Taken: 13:44 Temperature (F): 97.7 Height (in): 74 Pulse (bpm): 53 Weight (lbs): 265 Respiratory Rate (breaths/min): 18 Body Mass Index (BMI): 34 Blood Pressure (mmHg): 129/65 Reference Range: 80 - 120 mg / dl Electronic Signature(s) Signed: 11/15/2015 5:19:00 PM By: Alric Quan Entered By: Alric Quan on 11/15/2015 13:46:22

## 2015-11-16 NOTE — Progress Notes (Signed)
HEYWARD, LENOIR ([id number]) Visit Report for 11/15/2015 Chief Complaint Document Details Patient Name: Seth Mcintosh, Seth Mcintosh Date of Service: 11/15/2015 1:30 PM Medical Record Number: [id number] Patient Account Number: [id number] Date of Birth/Sex: 06-08-1940 (75 y.o. Male) Treating RN: Ahmed Prima Primary Care Physician: [facility], Dossie Der Other Clinician: Referring Physician: Keith Rake Treating Physician/Extender: Frann Rider in Treatment: 8 Information Obtained from: Patient Chief Complaint Patient seen for complaints of Non-Healing Wound which she's had on the right lower extremity for about 5 weeks Electronic Signature(s) Signed: 11/15/2015 2:10:23 PM By: Christin Fudge MD, FACS Entered By: Christin Fudge on 11/15/2015 14:10:22 Seth Mcintosh ([id number]) -------------------------------------------------------------------------------- HPI Details Patient Name: Seth Mcintosh Date of Service: 11/15/2015 1:30 PM Medical Record Number: [id number] Patient Account Number: [id number] Date of Birth/Sex: [birth year] (75 y.o. Male) Treating RN: Ahmed Prima Primary Care Physician: [facility], Dossie Der Other Clinician: Referring Physician: [facility], Dossie Der Treating Physician/Extender: Frann Rider in Treatment: 8 History of Present Illness Location: open wound right lower extremity lateral leg Quality: Patient reports experiencing a dull pain to affected area(s). Severity: Patient states wound are getting worse. Duration: Patient has had the wound for > 6 weeks prior to seeking treatment at the wound center Timing: Pain in wound is Intermittent (comes and goes Context: The wound occurred when the patient had a injury with a blunt object around 08/07/2015 Modifying Factors: a large swelling with drainage Associated Signs and Symptoms: Patient reports having increase swelling. HPI Description: 75 year old gentleman with known history of diabetes mellitus without complication had  a lacerated wound to his right lower extremity which was repaired about 4 weeks ago. Was seen by his PCP Dr. Donneta Romberg with a open wound on the right lateral leg with some discharge and erythema and he was placed on a blocks and local dressing was applied and referred to [place] for further care. Past medical history significant for diabetes mellitus type 2, coronary artery disease, hypertension, arthritis, depression, foot surgery o2, back surgery, CABG, TURP, right total knee replacement. He is a former smoker and quit in 1994. On questioning the patient he gives a history that he had a blunt injury to his right leg and this happened on April 15 of this year and about 2 weeks later he had blood draining from here and had to go to the ER and have sutures placed. However on looking at his medical records he was in the ER on 05/17/2015 with a blunt injury on his right lower extremity and at that stage x-ray showed no fracture and the right lower leg ultrasound showed no evidence of DVT. Diagnosis of a hematoma was made and he was given symptomatic treatment for this. The next time he was seen in the emergency room was on 08/11/2015 when the patient had a 10 cm laceration to the right lateral leg and this was sutured by the ER physician Dr. Dahlia Client. His blood alcohol level was 162 mg percent that day and the sutures were to be removed in 10 days' time. 09/23/2015 -- had his ultrasound of the left lower extremity for possible hematoma and the impression was there was no discrete drainable hematoma in the area of clinical concern on the left lower extremity. On reviewing his history he clearly states that he never had a large lacerated wound due to the injury or fall on April 19. He says he did have a few drinks but has not drunk and the hematoma which he had for a couple of months just burst open and caused  the big bleed and extrusion of clots. Electronic Signature(s) Signed: 11/15/2015 2:10:31 PM By:  Christin Fudge MD, FACS Entered By: Christin Fudge on 11/15/2015 14:10:30 Seth Mcintosh ([id number]) -------------------------------------------------------------------------------- Physical Exam Details Patient Name: Seth Mcintosh Date of Service: 11/15/2015 1:30 PM Medical Record Number: [id number] Patient Account Number: [id number] Date of Birth/Sex: Jan 25, 1941 (75 y.o. Male) Treating RN: Ahmed Prima Primary Care Physician: [facility], Dossie Der Other Clinician: Referring Physician: [facility], [facility] Treating Physician/Extender: Frann Rider in Treatment: 8 Constitutional . Pulse regular. Respirations normal and unlabored. Afebrile. . Eyes Nonicteric. Reactive to light. Ears, Nose, Mouth, and Throat Lips, teeth, and gums WNL.Marland Kitchen Moist mucosa without lesions. Neck supple and nontender. No palpable supraclavicular or cervical adenopathy. Normal sized without goiter. Respiratory WNL. No retractions.. Cardiovascular Pedal Pulses WNL. No clubbing, cyanosis or edema. Chest Breasts symmetical and no nipple discharge.. Breast tissue WNL, no masses, lumps, or tenderness.. Lymphatic No adneopathy. No adenopathy. No adenopathy. Musculoskeletal Adexa without tenderness or enlargement.. Digits and nails w/o clubbing, cyanosis, infection, petechiae, ischemia, or inflammatory conditions.. Integumentary (Hair, Skin) No suspicious lesions. No crepitus or fluctuance. No peri-wound warmth or erythema. No masses.Marland Kitchen Psychiatric Judgement and insight Intact.. No evidence of depression, anxiety, or agitation.. Notes the lymphedema is better and there is no seropurulent drainage from the wound. There continues to be some tunneling towards the 6:00 position Electronic Signature(s) Signed: 11/15/2015 2:11:49 PM By: Christin Fudge MD, FACS Entered By: Christin Fudge on 11/15/2015 14:11:49 Seth Mcintosh  ([id number]) -------------------------------------------------------------------------------- Physician Orders Details Patient Name: Seth Mcintosh Date of Service: 11/15/2015 1:30 PM Medical Record Number: [id number] Patient Account Number: [id number] Date of Birth/Sex: December 09, 1940 (76 y.o. Male) Treating RN: Ahmed Prima Primary Care Physician: [facility], Dossie Der Other Clinician: Referring Physician: [facility], Dossie Der Treating Physician/Extender: Frann Rider in Treatment: 8 Verbal / Phone Orders: Yes ClinicianCarolyne Fiscal, Debi Read Back and Verified: Yes Diagnosis Coding Wound Cleansing Wound #1 Right,Lateral Lower Leg o Cleanse wound with mild soap and water o May Shower, gently pat wound dry prior to applying new dressing. Anesthetic Wound #1 Right,Lateral Lower Leg o Topical Lidocaine 4% cream applied to wound bed prior to debridement Skin Barriers/Peri-Wound Care Wound #1 Right,Lateral Lower Leg o Skin Prep Primary Wound Dressing Wound #1 Right,Lateral Lower Leg o Other: - RTD Secondary Dressing Wound #1 Right,Lateral Lower Leg o Boardered Foam Dressing Dressing Change Frequency Wound #1 Right,Lateral Lower Leg o Change dressing every day. Edema Control Wound #1 Right,Lateral Lower Leg o Elevate legs to the level of the heart and pump ankles as often as possible o Support Garment 20-30 mm/Hg pressure to: - wear compression stockings Additional Orders / Instructions Wound #1 Right,Lateral Lower Leg o Increase protein intake. Seth Mcintosh, Seth Mcintosh ([id number]) Electronic Signature(s) Signed: 11/15/2015 4:20:18 PM By: Christin Fudge MD, FACS Signed: 11/15/2015 5:19:00 PM By: Alric Quan Entered By: Alric Quan on 11/15/2015 13:58:25 Seth Mcintosh ([id number]) -------------------------------------------------------------------------------- Problem List Details Patient Name: Seth Mcintosh Date of Service: 11/15/2015 1:30 PM Medical Record  Number: [id number] Patient Account Number: [id number] Date of Birth/Sex: 11-15-1940 (75 y.o. 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 Male) Treating RN: Ahmed Prima Primary Care Physician: [facility], Dossie Der Other Clinician: Referring Physician: [facility], Dossie Der Treating Physician/Extender: Frann Rider in Treatment: 8 Subjective Chief Complaint Information obtained from Patient Patient seen for complaints of Non-Healing Wound which she's had on the right lower extremity for about 5 weeks History of Present Illness (HPI) The following HPI elements were documented for the patient's wound: Location: open wound right lower extremity lateral leg Quality: Patient reports experiencing a dull pain to affected area(s). Severity: Patient states wound are getting worse. Duration: Patient has had the wound for > 6 weeks prior to seeking treatment at the wound center Timing: Pain in wound is Intermittent (comes and goes Context: The wound occurred when the  patient had a injury with a blunt object around 08/07/2015 Modifying Factors: a large swelling with drainage Associated Signs and Symptoms: Patient reports having increase swelling. 75 year old gentleman with known history of diabetes mellitus without complication had a lacerated wound to his right lower extremity which was repaired about 4 weeks ago. Was seen by his PCP Dr. Donneta Romberg with a open wound on the right lateral leg with some discharge and erythema and he was placed on a blocks and local dressing was applied and referred to [place] for further care. Past medical history significant for diabetes mellitus type 2, coronary artery disease, hypertension, arthritis, depression, foot surgery o2, back surgery, CABG, TURP, right total knee replacement. He is a former smoker and quit in 1994. On questioning the patient he gives a history that he had a blunt injury to his right leg and this happened on April 15 of this year and about 2 weeks later he had blood draining from here and had to go to the ER and have sutures placed. However on looking at his medical records he was in the ER on 05/17/2015 with a blunt injury on his right lower extremity and at that stage x-ray showed no fracture and the right lower leg ultrasound showed no evidence of DVT. Diagnosis of a hematoma was made and he was given symptomatic treatment for this. The next time he was seen in the emergency room was on 08/11/2015 when the patient had a 10 cm laceration to the right lateral leg and this was sutured by the ER physician Dr. Dahlia Client. His blood alcohol level was 162 mg percent that day and the sutures were to be removed in 10 days' time. 09/23/2015 -- had his ultrasound of the left lower extremity for possible hematoma and the impression was there was no discrete drainable hematoma in the area of clinical concern on the left lower extremity. On reviewing his history he clearly states that he never had a large lacerated wound  due to the injury or fall on April 19. He says he did have a few drinks but has not drunk and the hematoma which he had for a Seth Mcintosh, Seth Mcintosh. ([id number]) couple of months just burst open and caused the big bleed and extrusion of clots. Objective Constitutional Pulse regular. Respirations normal and unlabored. Afebrile. Vitals Time Taken: 1:44 PM, Height: 74 in, Weight: 265 lbs, BMI: 34, Temperature: 97.7 F, Pulse: 53 bpm, Respiratory Rate: 18 breaths/min, Blood Pressure: 129/65 mmHg.  Eyes Nonicteric. Reactive to light. Ears, Nose, Mouth, and Throat Lips, teeth, and gums WNL.Marland Kitchen Moist mucosa without lesions. Neck supple and nontender. No palpable supraclavicular or cervical adenopathy. Normal sized without goiter. Respiratory WNL. No retractions.. Cardiovascular Pedal Pulses WNL. No clubbing, cyanosis or edema. Chest Breasts symmetical and no nipple discharge.. Breast tissue WNL, no masses, lumps, or tenderness.. Lymphatic No adneopathy. No adenopathy. No adenopathy. Musculoskeletal Adexa without tenderness or enlargement.. Digits and nails w/o clubbing, cyanosis, infection, petechiae, ischemia, or inflammatory conditions.Marland Kitchen Psychiatric Judgement and insight Intact.. No evidence of depression, anxiety, or agitation.. General Notes: the lymphedema is better and there is no seropurulent drainage from the wound. There continues to be some tunneling towards the 6:00 position Integumentary (Hair, Skin) No suspicious lesions. No crepitus or fluctuance. No peri-wound warmth or erythema. No masses.Marland Kitchen Seth Mcintosh, Seth Mcintosh ([id number]) Wound #1 status is Open. Original cause of wound was Trauma. The wound is located on the Right,Lateral Lower Leg. The wound measures 2cm length x 1cm width x 0.4cm depth; 1.571cm^2 area and 0.628cm^3 volume. The wound is limited to skin breakdown. There is no tunneling noted, however, there is undermining starting at 2:00 and ending at 12:00 with a maximum  distance of 2.3cm. There is a large amount of serosanguineous drainage noted. The wound margin is thickened. There is large (67-100%) red granulation within the wound bed. There is a small (1-33%) amount of necrotic tissue within the wound bed including Adherent Slough. The periwound skin appearance exhibited: Localized Edema, Moist. Periwound temperature was noted as No Abnormality. The periwound has tenderness on palpation. 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 Anesthetic: Wound #1 Right,Lateral Lower Leg: Topical Lidocaine 4% cream applied to wound bed prior to debridement Skin Barriers/Peri-Wound Care: Wound #1 Right,Lateral Lower Leg: Skin Prep Primary Wound Dressing: Wound #1 Right,Lateral Lower Leg: Other: - RTD Secondary Dressing: Wound #1 Right,Lateral Lower Leg: Boardered Foam Dressing Dressing Change Frequency: Wound #1 Right,Lateral Lower Leg: Change dressing every day. Edema Control: Wound #1 Right,Lateral Lower Leg: ELDON, LATTER ([id number]) Elevate legs to the level of the heart and pump ankles as often as possible Support Garment 20-30 mm/Hg pressure to: - wear compression stockings Additional Orders / Instructions: Wound #1 Right,Lateral Lower Leg: Increase protein intake. 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 We will the wound with RTD strips continue local care. if we do not get positive results in the next couple of weeks we will  try using a wound VAC like a snap. Electronic Signature(s) Signed: 11/15/2015 2:14:28 PM By: Christin Fudge MD, FACS Entered By: Christin Fudge on 11/15/2015 14:14:28 Seth Mcintosh ([id number]) -------------------------------------------------------------------------------- [place] Details Patient Name: Seth Mcintosh Date of Service: 11/15/2015 Medical Record Number: [id number] Patient Account Number: [id number] Date of Birth/Sex: June 22, 1940 (75 y.o. Male) Treating RN: Ahmed Prima Primary Care Physician: [facility], Dossie Der Other Clinician: Referring Physician: [facility], Dossie Der Treating Physician/Extender: Frann Rider in Treatment: 8 Diagnosis Coding ICD-10 Codes Code Description E11.622 Type 2 diabetes mellitus with other  skin ulcer L97.213 Non-pressure chronic ulcer of right calf with necrosis of muscle M70.861 Other soft tissue disorders related to use, overuse and pressure, right lower leg Facility Procedures CPT4 Code: [id number] Description: 99213 - WOUND CARE VISIT-LEV 3 EST PT Modifier: Quantity: 1 Physician Procedures CPT4: Description Modifier Quantity Code [id number] - WC PHYS LEVEL 3 - EST PT 1 ICD-10 Description Diagnosis E11.622 Type 2 diabetes mellitus with other skin ulcer L97.213 Non-pressure chronic ulcer of right calf with necrosis of muscle M70.861 Other  soft tissue disorders related to use, overuse and pressure, right lower leg Electronic Signature(s) Signed: 11/15/2015 4:20:18 PM By: Christin Fudge MD, FACS Signed: 11/15/2015 5:19:00 PM By: Alric Quan Previous Signature: 11/15/2015 2:14:40 PM Version By: Christin Fudge MD, FACS Entered By: Alric Quan on 11/15/2015 14:16:14

## 2015-11-18 ENCOUNTER — Encounter: Payer: Self-pay | Admitting: Family Medicine

## 2015-11-18 ENCOUNTER — Ambulatory Visit (INDEPENDENT_AMBULATORY_CARE_PROVIDER_SITE_OTHER): Payer: Medicare Other | Admitting: Family Medicine

## 2015-11-18 VITALS — BP 128/70 | HR 85 | Temp 98.2°F | Resp 18 | Ht 74.0 in | Wt 271.1 lb

## 2015-11-18 DIAGNOSIS — F5104 Psychophysiologic insomnia: Secondary | ICD-10-CM

## 2015-11-18 DIAGNOSIS — G4700 Insomnia, unspecified: Secondary | ICD-10-CM

## 2015-11-18 DIAGNOSIS — E119 Type 2 diabetes mellitus without complications: Secondary | ICD-10-CM

## 2015-11-18 DIAGNOSIS — F411 Generalized anxiety disorder: Secondary | ICD-10-CM | POA: Diagnosis not present

## 2015-11-18 LAB — GLUCOSE, POCT (MANUAL RESULT ENTRY): POC Glucose: 159 mg/dl — AB (ref 70–99)

## 2015-11-18 LAB — POCT GLYCOSYLATED HEMOGLOBIN (HGB A1C): HEMOGLOBIN A1C: 6.3

## 2015-11-18 MED ORDER — CLONAZEPAM 0.5 MG PO TABS: 0.5000 mg | ORAL_TABLET | Freq: Two times a day (BID) | ORAL | 0 refills | Status: DC | PRN

## 2015-11-18 MED ORDER — SUVOREXANT 10 MG PO TABS: 1.0000 | ORAL_TABLET | Freq: Every evening | ORAL | 0 refills | Status: DC | PRN

## 2015-11-18 NOTE — Progress Notes (Signed)
Name: Seth SPERRAZZA Sr.   MRN: [id number]    DOB: May 10, 1940   Date:11/18/2015       Progress Note  Subjective  Chief Complaint  Chief Complaint  Patient presents with  . Diabetes    pt here for 2 month follow up and med refill  . Insomnia  . Medication Refill    Diabetes  He presents for his follow-up diabetic visit. He has type 2 diabetes mellitus. His disease course has been stable. Hypoglycemia symptoms include nervousness/anxiousness. Pertinent negatives for diabetes include no polydipsia, no polyuria and no weight loss. Symptoms are stable. Pertinent negatives for diabetic complications include no CVA, heart disease or impotence. Current diabetic treatment includes oral agent (monotherapy). He is following a diabetic diet. He participates in exercise three times a week. His breakfast blood glucose range is generally 110-130 mg/dl. An ACE inhibitor/angiotensin II receptor blocker is being taken. Eye exam is current.  Insomnia  Primary symptoms: no sleep disturbance, difficulty falling asleep, no malaise/fatigue.  The problem occurs nightly. The symptoms are aggravated by anxiety. Past treatments include medication. PMH includes: family stress or anxiety.  Anxiety  Presents for follow-up visit. Symptoms include excessive worry, insomnia and nervous/anxious behavior. Patient reports no impotence or nausea. Symptoms occur most days. The severity of symptoms is moderate and causing significant distress.      Past Medical History:  Diagnosis Date  . Arthritis    rheumatoid   . Coronary artery disease   . Depression   . Diabetes mellitus without complication ([place])   . GERD (gastroesophageal reflux disease)   . Hypertension   . Localized osteoarthritis of right knee 05/21/2012  . Reflux   . Sleeping difficulty    takes med nightly  . Urgency of urination     Past Surgical History:  Procedure Laterality Date  . BACK SURGERY    . CORONARY ARTERY BYPASS GRAFT  2004   x4  vessels  . FOOT SURGERY     x2  . FOOT SURGERY     x2  . TOTAL KNEE ARTHROPLASTY  05/21/2012   Procedure: TOTAL KNEE ARTHROPLASTY;  Surgeon: Johnny Bridge, MD;  Location: [facility];  Service: Orthopedics;  Laterality: Right;  . TRANSURETHRAL RESECTION OF PROSTATE      Family History  Problem Relation Age of Onset  . Kidney disease Neg Hx   . Bladder Cancer Brother   . Prostate cancer Neg Hx     Social History   Social History  . Marital status: Widowed    Spouse name: N/A  . Number of children: N/A  . Years of education: N/A   Occupational History  . Not on file.   Social History Main Topics  . Smoking status: Former Smoker    Quit date: 05/14/1992  . Smokeless tobacco: Not on file  . Alcohol use 0.0 oz/week     Comment: moderatly  . Drug use: No  . Sexual activity: Not on file   Other Topics Concern  . Not on file   Social History Narrative  . No narrative on file    Current Outpatient Prescriptions:  .  Ascorbic Acid (VITAMIN C) 1000 MG tablet, Take 2,000 mg by mouth daily., Disp: , Rfl:  .  aspirin EC 81 MG tablet, Take 81 mg by mouth daily., Disp: , Rfl:  .  benzonatate (TESSALON) 200 MG capsule, Reported on 06/14/2015, Disp: , Rfl: 0 .  buPROPion (WELLBUTRIN XL) 300 MG 24 hr tablet, TAKE 1 TABLET  DAILY, Disp: 90 tablet, Rfl: 3 .  busPIRone (BUSPAR) 15 MG tablet, TAKE 1 TABLET TWICE A DAY, Disp: 180 tablet, Rfl: 3 .  Cholecalciferol (VITAMIN D) 2000 UNITS tablet, Take 2,000 Units by mouth daily., Disp: , Rfl:  .  clonazePAM (KLONOPIN) 0.5 MG tablet, Take 1 tablet (0.5 mg total) by mouth 2 (two) times daily as needed. anxiety, Disp: 180 tablet, Rfl: 0 .  Coenzyme Q10 (CO Q 10) 100 MG CAPS, Take 1 capsule by mouth daily., Disp: , Rfl:  .  fish oil-omega-3 fatty acids 1000 MG capsule, Take 2 g by mouth daily., Disp: , Rfl:  .  fluticasone (FLONASE) 50 MCG/ACT nasal spray, Place 2 sprays into both nostrils daily., Disp: 16 g, Rfl: 1 .  losartan (COZAAR) 50 MG  tablet, Take 1 tablet (50 mg total) by mouth daily., Disp: 90 tablet, Rfl: 1 .  metFORMIN (GLUCOPHAGE) 1000 MG tablet, TAKE 1 TABLET TWICE A DAY WITH MEALS, Disp: 180 tablet, Rfl: 3 .  moxifloxacin (AVELOX) 400 MG tablet, Take 1 tablet (400 mg total) by mouth daily at 8 pm., Disp: 7 tablet, Rfl: 0 .  Multiple Vitamin (MULTIVITAMIN WITH MINERALS) TABS, Take 1 tablet by mouth daily., Disp: , Rfl:  .  Multiple Vitamins-Calcium (CALCI-MAX PO), Take 1 tablet by mouth daily. Reported on 06/02/2015, Disp: , Rfl:  .  pantoprazole (PROTONIX) 40 MG tablet, Take 1 tablet (40 mg total) by mouth daily., Disp: 90 tablet, Rfl: 0 .  sildenafil (VIAGRA) 50 MG tablet, Take 1 tablet (50 mg total) by mouth daily as needed for erectile dysfunction. 1 tablet po 30 mins prior to sexual activity, Disp: 10 tablet, Rfl: 0 .  solifenacin (VESICARE) 10 MG tablet, Take 1 tablet (10 mg total) by mouth daily., Disp: 90 tablet, Rfl: 3 .  solifenacin (VESICARE) 10 MG tablet, Take 1 tablet (10 mg total) by mouth daily., Disp: 90 tablet, Rfl: 3 .  Suvorexant (BELSOMRA) 10 MG TABS, Take 1 tablet by mouth at bedtime as needed., Disp: 90 tablet, Rfl: 0 .  VYTORIN 10-20 MG tablet, TAKE 1 TABLET DAILY AT 6 P.M, Disp: 90 tablet, Rfl: 3  No Known Allergies   Review of Systems  Constitutional: Negative for malaise/fatigue and weight loss.  Gastrointestinal: Negative for abdominal pain, nausea and vomiting.  Genitourinary: Positive for urgency. Negative for impotence.  Neurological: Negative for tingling.  Endo/Heme/Allergies: Negative for polydipsia.  Psychiatric/Behavioral: Negative for sleep disturbance. The patient is nervous/anxious and has insomnia.     Objective  Vitals:   11/18/15 1101  BP: 128/70  Pulse: 85  Resp: 18  Temp: 98.2 F (36.8 C)  SpO2: 98%  Weight: 271 lb 2 oz (123 kg)  Height: 6\' 2"  (1.88 m)    Physical Exam  Constitutional: He is oriented to person, place, and time and well-developed, well-nourished,  and in no distress.  HENT:  Head: Normocephalic and atraumatic.  Cardiovascular: Normal rate, regular rhythm and normal heart sounds.   No murmur heard. Pulmonary/Chest: Effort normal and breath sounds normal. He has no wheezes.  Abdominal: Soft. Bowel sounds are normal. There is no tenderness.  Neurological: He is alert and oriented to person, place, and time.  Skin: Skin is warm and dry.  Psychiatric: Mood, memory, affect and judgment normal.  Nursing note and vitals reviewed.     Assessment & Plan  1. Controlled type 2 diabetes mellitus without complication, without long-term current use of insulin ([place]) Well-controlled diabetes, continue on metformin as prescribed. - POCT  HgB A1C - POCT Glucose (CBG) - Urine Microalbumin w/creat. ratio  2. Chronic insomnia Stable, taking medication without any adverse effects. - Suvorexant (BELSOMRA) 10 MG TABS; Take 1 tablet by mouth at bedtime as needed.  Dispense: 90 tablet; Refill: 0  3. Generalized anxiety disorder Patient requesting 1 refill for the local pharmacy as the mail-order pharmacy takes up to 2 weeks to send him the medications. Anxiety stable and responsive to clonazepam. - clonazePAM (KLONOPIN) 0.5 MG tablet; Take 1 tablet (0.5 mg total) by mouth 2 (two) times daily as needed. anxiety  Dispense: 180 tablet; Refill: 0 - clonazePAM (KLONOPIN) 0.5 MG tablet; Take 1 tablet (0.5 mg total) by mouth 2 (two) times daily as needed. anxiety  Dispense: 60 tablet; Refill: 0   Mikylah Ackroyd [facility] 11/18/2015 11:22 AM

## 2015-11-19 ENCOUNTER — Telehealth: Payer: Self-pay | Admitting: Emergency Medicine

## 2015-11-19 LAB — MICROALBUMIN / CREATININE URINE RATIO
Creatinine, Urine: 164 mg/dL (ref 20–370)
MICROALB UR: 1.3 mg/dL
MICROALB/CREAT RATIO: 8 ug/mg{creat} (ref <30)

## 2015-11-19 NOTE — Telephone Encounter (Signed)
Patient notified of results.

## 2015-11-22 ENCOUNTER — Encounter: Payer: Medicare Other | Admitting: Surgery

## 2015-11-22 DIAGNOSIS — I10 Essential (primary) hypertension: Secondary | ICD-10-CM | POA: Diagnosis not present

## 2015-11-22 DIAGNOSIS — I2510 Atherosclerotic heart disease of native coronary artery without angina pectoris: Secondary | ICD-10-CM | POA: Diagnosis not present

## 2015-11-22 DIAGNOSIS — E11622 Type 2 diabetes mellitus with other skin ulcer: Secondary | ICD-10-CM | POA: Diagnosis not present

## 2015-11-22 DIAGNOSIS — S81811A Laceration without foreign body, right lower leg, initial encounter: Secondary | ICD-10-CM | POA: Diagnosis not present

## 2015-11-22 DIAGNOSIS — Z87891 Personal history of nicotine dependence: Secondary | ICD-10-CM | POA: Diagnosis not present

## 2015-11-22 DIAGNOSIS — L97213 Non-pressure chronic ulcer of right calf with necrosis of muscle: Secondary | ICD-10-CM | POA: Diagnosis not present

## 2015-11-22 DIAGNOSIS — M70861 Other soft tissue disorders related to use, overuse and pressure, right lower leg: Secondary | ICD-10-CM | POA: Diagnosis not present

## 2015-11-23 NOTE — Progress Notes (Signed)
Seth Mcintosh ([id number]) Visit Report for 11/22/2015 Arrival Information Details Patient Name: Seth Mcintosh Date of Service: 11/22/2015 2:15 PM Medical Record Number: [id number] Patient Account Number: [id number] Date of Birth/Sex: 01/04/1941 (75 y.o. Male) Treating RN: Seth Mcintosh Primary Care Physician: Seth Mcintosh Other Clinician: Referring Physician: [facility], Dossie Mcintosh Treating Physician/Extender: Frann Rider in Treatment: 9 Visit Information History Since Last Visit All ordered tests and consults were completed: No Patient Arrived: Ambulatory Added or deleted any medications: No Arrival Time: 14:32 Any new allergies or adverse reactions: No Accompanied By: self Had a fall or experienced change in No Transfer Assistance: None activities of daily living that may affect Patient Identification Verified: Yes risk of falls: Secondary Verification Process Yes Signs or symptoms of abuse/neglect since last No Completed: visito Patient Requires Transmission-Based No Hospitalized since last visit: No Precautions: Pain Present Now: No Patient Has Alerts: Yes Patient Alerts: DM II Electronic Signature(s) Signed: 11/22/2015 5:30:24 PM By: Alric Quan Entered By: Alric Quan on 11/22/2015 14:32:58 Seth Mcintosh ([id number]) -------------------------------------------------------------------------------- Clinic Level of Care Assessment Details Patient Name: Seth Mcintosh Date of Service: 11/22/2015 2:15 PM Medical Record Number: [id number] Patient Account Number: [id number] Date of Birth/Sex: May 19, 1940 (75 y.o. Male) Treating RN: Seth Mcintosh Primary Care Physician: Seth Mcintosh Other Clinician: Referring Physician: [facility], Mcintosh Treating Physician/Extender: Frann Rider in Treatment: 9 Clinic Level of Care Assessment Items TOOL 4 Quantity Score X - Use when only an EandM is performed on FOLLOW-UP visit 1 0 ASSESSMENTS - Nursing Assessment /  Reassessment X - Reassessment of Co-morbidities (includes updates in patient status) 1 10 X - Reassessment of Adherence to Treatment Plan 1 5 ASSESSMENTS - Wound and Skin Assessment / Reassessment []  - Simple Wound Assessment / Reassessment - one wound 0 []  - Complex Wound Assessment / Reassessment - multiple wounds 0 []  - Dermatologic / Skin Assessment (not related to wound area) 0 ASSESSMENTS - Focused Assessment []  - Circumferential Edema Measurements - multi extremities 0 []  - Nutritional Assessment / Counseling / Intervention 0 []  - Lower Extremity Assessment (monofilament, tuning fork, pulses) 0 []  - Peripheral Arterial Disease Assessment (using hand held doppler) 0 ASSESSMENTS - Ostomy and/or Continence Assessment and Care []  - Incontinence Assessment and Management 0 []  - Ostomy Care Assessment and Management (repouching, etc.) 0 PROCESS - Coordination of Care X - Simple Patient / Family Education for ongoing care 1 15 []  - Complex (extensive) Patient / Family Education for ongoing care 0 []  - Staff obtains Programmer, systems, Records, Test Results / Process Orders 0 []  - Staff telephones HHA, Nursing Homes / Clarify orders / etc 0 []  - Routine Transfer to another Facility (non-emergent condition) 0 Seth Mcintosh ([id number]) []  - Routine Hospital Admission (non-emergent condition) 0 []  - New Admissions / Biomedical engineer / Ordering NPWT, Apligraf, etc. 0 []  - Emergency Hospital Admission (emergent condition) 0 X - Simple Discharge Coordination 1 10 []  - Complex (extensive) Discharge Coordination 0 PROCESS - Special Needs []  - Pediatric / Minor Patient Management 0 []  - Isolation Patient Management 0 []  - Hearing / Language / Visual special needs 0 []  - Assessment of Community assistance (transportation, D/C planning, etc.) 0 []  - Additional assistance / Altered mentation 0 []  - Support Surface(s) Assessment (bed, cushion, seat, etc.) 0 INTERVENTIONS - Wound Cleansing /  Measurement X - Simple Wound Cleansing - one wound 1 5 []  - Complex Wound Cleansing - multiple wounds 0 X - Wound Imaging (photographs - any number of wounds)  1 5 []  - Wound Tracing (instead of photographs) 0 X - Simple Wound Measurement - one wound 1 5 []  - Complex Wound Measurement - multiple wounds 0 INTERVENTIONS - Wound Dressings []  - Small Wound Dressing one or multiple wounds 0 X - Medium Wound Dressing one or multiple wounds 1 15 []  - Large Wound Dressing one or multiple wounds 0 X - Application of Medications - topical 1 5 []  - Application of Medications - injection 0 INTERVENTIONS - Miscellaneous []  - External ear exam 0 Seth Mcintosh ([id number]) []  - Specimen Collection (cultures, biopsies, blood, body fluids, etc.) 0 []  - Specimen(s) / Culture(s) sent or taken to Lab for analysis 0 []  - Patient Transfer (multiple staff / Harrel Lemon Lift / Similar devices) 0 []  - Simple Staple / Suture removal (25 or less) 0 []  - Complex Staple / Suture removal (26 or more) 0 []  - Hypo / Hyperglycemic Management (close monitor of Blood Glucose) 0 []  - Ankle / Brachial Index (ABI) - do not check if billed separately 0 X - Vital Signs 1 5 Has the patient been seen at the hospital within the last three years: Yes Total Score: 80 Level Of Care: New/Established - Level 3 Electronic Signature(s) Signed: 11/22/2015 5:30:24 PM By: Alric Quan Entered By: Alric Quan on 11/22/2015 16:33:27 Seth Mcintosh ([id number]) -------------------------------------------------------------------------------- Encounter Discharge Information Details Patient Name: Seth Mcintosh Date of Service: 11/22/2015 2:15 PM Medical Record Number: [id number] Patient Account Number: [id number] Date of Birth/Sex: [birth year] (75 y.o. Male) Treating RN: Seth Mcintosh Primary Care Physician: [facility], Dossie Mcintosh Other Clinician: Referring Physician: [facility], Mcintosh Treating Physician/Extender: Frann Rider in  Treatment: 9 Encounter Discharge Information Items Discharge Pain Level: 0 Discharge Condition: Stable Ambulatory Status: Ambulatory Discharge Destination: Home Transportation: Private Auto Accompanied By: self Schedule Follow-up Appointment: Yes Medication Reconciliation completed and provided to Patient/Care Yes Tereasa Yilmaz: Provided on Clinical Summary of Care: 11/22/2015 Form Type Recipient Paper Patient JK Electronic Signature(s) Signed: 11/22/2015 3:09:47 PM By: Ruthine Dose Entered By: Ruthine Dose on 11/22/2015 15:09:47 Seth Mcintosh ([id number]) -------------------------------------------------------------------------------- Lower Extremity Assessment Details Patient Name: Seth Mcintosh Date of Service: 11/22/2015 2:15 PM Medical Record Number: [id number] Patient Account Number: [id number] Date of Birth/Sex: 1940/12/18 (75 y.o. Male) Treating RN: Seth Mcintosh Primary Care Physician: [facility], Dossie Mcintosh Other Clinician: Referring Physician: [facility], Mcintosh Treating Physician/Extender: Frann Rider in Treatment: 9 Vascular Assessment Pulses: Posterior Tibial Dorsalis Pedis Palpable: [Right:Yes] Extremity colors, hair growth, and conditions: Extremity Color: [Right:Normal] Temperature of Extremity: [Right:Warm] Capillary Refill: [Right:< 3 seconds] Toe Nail Assessment Left: Right: Thick: No Discolored: No Deformed: No Improper Length and Hygiene: No Electronic Signature(s) Signed: 11/22/2015 5:30:24 PM By: Alric Quan Entered By: Alric Quan on 11/22/2015 14:35:11 Seth Mcintosh ([id number]) -------------------------------------------------------------------------------- Multi Wound Chart Details Patient Name: Seth Mcintosh Date of Service: 11/22/2015 2:15 PM Medical Record Number: [id number] Patient Account Number: [id number] Date of Birth/Sex: 03/06/1941 (75 y.o. Male) Treating RN: Seth Mcintosh Primary Care Physician: [facility], Dossie Mcintosh Other  Clinician: Referring Physician: [facility], Mcintosh Treating Physician/Extender: Frann Rider in Treatment: 9 Vital Signs Height(in): 74 Pulse(bpm): 58 Weight(lbs): 265 Blood Pressure 115/62 (mmHg): Body Mass Index(BMI): 34 Temperature(F): 97.8 Respiratory Rate 18 (breaths/min): Photos: [1:No Photos] [N/A:N/A] Wound Location: [1:Right Lower Leg - Lateral] [N/A:N/A] Wounding Event: [1:Trauma] [N/A:N/A] Primary Etiology: [1:Trauma, Other] [N/A:N/A] Comorbid History: [1:Glaucoma, Sleep Apnea, Coronary Artery Disease, Hypertension, Type II Diabetes, Osteoarthritis] [N/A:N/A] Date Acquired: [1:08/07/2015] [N/A:N/A] Weeks of Treatment: [1:9] [N/A:N/A] Wound Status: [1:Open] [N/A:N/A] Measurements L x W  x D 1.7x0.7x0.3 [N/A:N/A] (cm) Area (cm) : [1:0.935] [N/A:N/A] Volume (cm) : [1:0.28] [N/A:N/A] % Reduction in Area: [1:76.20%] [N/A:N/A] % Reduction in Volume: 89.80% [N/A:N/A] Starting Position 1 3 (o'clock): Ending Position 1 [1:6] (o'clock): Maximum Distance 1 1.9 (cm): Undermining: [1:Yes] [N/A:N/A] Classification: [1:Full Thickness With Exposed Support Structures] [N/A:N/A] HBO Classification: [1:Grade 1] [N/A:N/A] Exudate Amount: [1:Large] [N/A:N/A] Exudate Type: [1:Serosanguineous] [N/A:N/A] Exudate Color: red, brown N/A N/A Wound Margin: Thickened N/A N/A Granulation Amount: Large (67-100%) N/A N/A Granulation Quality: Red N/A N/A Necrotic Amount: Small (1-33%) N/A N/A Exposed Structures: Fascia: No N/A N/A Fat: No Tendon: No Muscle: No Joint: No Bone: No Limited to Skin Breakdown Epithelialization: None N/A N/A Periwound Skin Texture: Edema: Yes N/A N/A Periwound Skin Moist: Yes N/A N/A Moisture: Periwound Skin Color: No Abnormalities Noted N/A N/A Temperature: No Abnormality N/A N/A Tenderness on Yes N/A N/A Palpation: Wound Preparation: Ulcer Cleansing: N/A N/A Rinsed/Irrigated with Saline Topical Anesthetic Applied: Other:  lidocaine 4% Treatment Notes Electronic Signature(s) Signed: 11/22/2015 5:30:24 PM By: Alric Quan Entered By: Alric Quan on 11/22/2015 14:42:29 Seth Mcintosh ([id number]) -------------------------------------------------------------------------------- Multi-Disciplinary Care Plan Details Patient Name: Seth Mcintosh Date of Service: 11/22/2015 2:15 PM Medical Record Number: [id number] Patient Account Number: [id number] Date of Birth/Sex: 10-17-1940 (76 y.o. Male) Treating RN: Seth Mcintosh Primary Care Physician: [facility], Dossie Mcintosh Other Clinician: Referring Physician: [facility], Dossie Mcintosh Treating Physician/Extender: Frann Rider in Treatment: 9 Active Inactive Abuse / Safety / Falls / Self Care Management Nursing Diagnoses: Potential for falls Goals: Patient will remain injury free Date Initiated: 09/16/2015 Goal Status: Active Interventions: Assess fall risk on admission and as needed Notes: Orientation to the Wound Care Program Nursing Diagnoses: Knowledge deficit related to the wound healing center program Goals: Patient/caregiver will verbalize understanding of the [facility] Date Initiated: 09/16/2015 Goal Status: Active Interventions: Provide education on orientation to the wound center Notes: Pain, Acute or Chronic Nursing Diagnoses: Pain, acute or chronic: actual or potential Potential alteration in comfort, pain Goals: ZIHAN, GIMBEL ([id number]) Patient will verbalize adequate pain control and receive pain control interventions during procedures as needed Date Initiated: 09/16/2015 Goal Status: Active Interventions: Assess comfort goal upon admission Complete pain assessment as per visit requirements Notes: Soft Tissue Infection Nursing Diagnoses: Impaired tissue integrity Goals: Patient will remain free of wound infection Date Initiated: 09/16/2015 Goal Status: Active Interventions: Assess signs and symptoms of infection  every visit Notes: Wound/Skin Impairment Nursing Diagnoses: Impaired tissue integrity Goals: Ulcer/skin breakdown will have a volume reduction of 30% by week 4 Date Initiated: 09/16/2015 Goal Status: Active Ulcer/skin breakdown will have a volume reduction of 50% by week 8 Date Initiated: 09/16/2015 Goal Status: Active Ulcer/skin breakdown will have a volume reduction of 80% by week 12 Date Initiated: 09/16/2015 Goal Status: Active Interventions: Assess patient/caregiver ability to obtain necessary supplies Assess ulceration(s) every visit Notes: ZYSHAUN, EIBEN ([id number]) Electronic Signature(s) Signed: 11/22/2015 5:30:24 PM By: Alric Quan Entered By: Alric Quan on 11/22/2015 14:42:21 Seth Mcintosh ([id number]) -------------------------------------------------------------------------------- Pain Assessment Details Patient Name: Seth Mcintosh Date of Service: 11/22/2015 2:15 PM Medical Record Number: [id number] Patient Account Number: [id number] Date of Birth/Sex: [birth year] (75 y.o. Male) Treating RN: Seth Mcintosh Primary Care Physician: [facility], Dossie Mcintosh Other Clinician: Referring Physician: Keith Rake Treating Physician/Extender: Frann Rider in Treatment: 9 Active Problems Location of Pain Severity and Description of Pain Patient Has Paino No Site Locations With Dressing Change: No Pain Management and Medication Current Pain Management: Electronic Signature(s) Signed: 11/22/2015 5:30:24  PM By: Alric Quan Entered By: Alric Quan on 11/22/2015 14:33:09 Seth Mcintosh ([id number]) -------------------------------------------------------------------------------- Patient/Caregiver Education Details Patient Name: Seth Mcintosh Date of Service: 11/22/2015 2:15 PM Medical Record Number: [id number] Patient Account Number: [id number] Date of Birth/Gender: Aug 30, 1940 (75 y.o. Male) Treating RN: Seth Mcintosh Primary Care Physician: [facility],  Mcintosh Other Clinician: Referring Physician: [facility], Mcintosh Treating Physician/Extender: Frann Rider in Treatment: 9 Education Assessment Education Provided To: Patient Education Topics Provided Wound/Skin Impairment: Handouts: Other: change dressing as ordered Methods: Demonstration, Explain/Verbal Responses: State content correctly Electronic Signature(s) Signed: 11/22/2015 5:30:24 PM By: Alric Quan Entered By: Alric Quan on 11/22/2015 14:54:03 Seth Mcintosh ([id number]) -------------------------------------------------------------------------------- Wound Assessment Details Patient Name: Seth Mcintosh Date of Service: 11/22/2015 2:15 PM Medical Record Number: [id number] Patient Account Number: [id number] Date of Birth/Sex: 1940/11/15 (75 y.o. Male) Treating RN: Seth Mcintosh Primary Care Physician: [facility], Dossie Mcintosh Other Clinician: Referring Physician: [facility], Mcintosh Treating Physician/Extender: Frann Rider in Treatment: 9 Wound Status Wound Number: 1 Primary Trauma, Other Etiology: Wound Location: Right Lower Leg - Lateral Wound Open Wounding Event: Trauma Status: Date Acquired: 08/07/2015 Comorbid Glaucoma, Sleep Apnea, Coronary Weeks Of Treatment: 9 History: Artery Disease, Hypertension, Type II Clustered Wound: No Diabetes, Osteoarthritis Photos Photo Uploaded By: Alric Quan on 11/22/2015 17:14:03 Wound Measurements Length: (cm) 1.7 % Reduction in Width: (cm) 0.7 % Reduction in Depth: (cm) 0.3 Epithelializat Area: (cm) 0.935 Tunneling: Volume: (cm) 0.28 Undermining: Starting Po Ending Posi Maximum Dis Area: 76.2% Volume: 89.8% ion: None No Yes sition (o'clock): 3 tion (o'clock): 6 tance: (cm) 1.9 Wound Description Full Thickness With Exposed Classification: Support Structures Diabetic Severity Grade 1 (Wagner): Wound Margin: Thickened Exudate Amount: Large Exudate Type: Serosanguineous KEIR, TRAUGHBER  ([id number]) Foul Odor After Cleansing: No Exudate Color: red, brown Wound Bed Granulation Amount: Large (67-100%) Exposed Structure Granulation Quality: Red Fascia Exposed: No Necrotic Amount: Small (1-33%) Fat Layer Exposed: No Necrotic Quality: Adherent Slough Tendon Exposed: No Muscle Exposed: No Joint Exposed: No Bone Exposed: No Limited to Skin Breakdown Periwound Skin Texture Texture Color No Abnormalities Noted: No No Abnormalities Noted: No Localized Edema: Yes Temperature / Pain Moisture Temperature: No Abnormality No Abnormalities Noted: No Tenderness on Palpation: Yes Moist: Yes Wound Preparation Ulcer Cleansing: Rinsed/Irrigated with Saline Topical Anesthetic Applied: Other: lidocaine 4%, Treatment Notes Wound #1 (Right, Lateral Lower Leg) 1. Cleansed with: Clean wound with Normal Saline 2. Anesthetic Topical Lidocaine 4% cream to wound bed prior to debridement 3. Peri-wound Care: Skin Prep 4. Dressing Applied: Other dressing (specify in notes) 5. Secondary [place] Notes RTD Electronic Signature(s) Signed: 11/22/2015 5:30:24 PM By: Alric Quan Entered By: Alric Quan on 11/22/2015 14:40:47 Seth Mcintosh ([id number]) -------------------------------------------------------------------------------- Green Details Patient Name: Seth Mcintosh Date of Service: 11/22/2015 2:15 PM Medical Record Number: [id number] Patient Account Number: [id number] Date of Birth/Sex: Nov 10, 1940 (75 y.o. Male) Treating RN: Seth Mcintosh Primary Care Physician: [facility], [place] Other Clinician: Referring Physician: [facility], Mcintosh Treating Physician/Extender: Frann Rider in Treatment: 9 Vital Signs Time Taken: 14:33 Temperature (F): 97.8 Height (in): 74 Pulse (bpm): 58 Weight (lbs): 265 Respiratory Rate (breaths/min): 18 Body Mass Index (BMI): 34 Blood Pressure (mmHg): 115/62 Reference Range: 80 - 120 mg / dl Electronic  Signature(s) Signed: 11/22/2015 5:30:24 PM By: Alric Quan Entered By: Alric Quan on 11/22/2015 14:34:48

## 2015-11-23 NOTE — Progress Notes (Addendum)
Seth, Mcintosh ([id number]) Visit Report for 11/22/2015 Chief Complaint Document Details Patient Name: Seth Mcintosh, Seth Mcintosh Date of Service: 11/22/2015 2:15 PM Medical Record Number: [id number] Patient Account Number: [id number] Date of Birth/Sex: 1940/06/27 (75 y.o. Male) Treating RN: Ahmed Prima Primary Care Physician: [facility], [place] Other Clinician: Referring Physician: Keith Rake Treating Physician/Extender: Frann Rider in Treatment: 9 Information Obtained from: Patient Chief Complaint Patient seen for complaints of Non-Healing Wound which she's had on the right lower extremity for about 5 weeks Electronic Signature(s) Signed: 11/22/2015 2:52:57 PM By: Christin Fudge MD, FACS Entered By: Christin Fudge on 11/22/2015 14:52:57 Seth Mcintosh ([id number]) -------------------------------------------------------------------------------- HPI Details Patient Name: Seth Mcintosh Date of Service: 11/22/2015 2:15 PM Medical Record Number: [id number] Patient Account Number: [id number] Date of Birth/Sex: 1940/08/06 (75 y.o. Male) Treating RN: Ahmed Prima Primary Care Physician: [facility], [place] Der Other Clinician: Referring Physician: [facility], [place] Der Treating Physician/Extender: Frann Rider in Treatment: 9 History of Present Illness Location: open wound right lower extremity lateral leg Quality: Patient reports experiencing a dull pain to affected area(s). Severity: Patient states wound are getting worse. Duration: Patient has had the wound for > 6 weeks prior to seeking treatment at the wound center Timing: Pain in wound is Intermittent (comes and goes Context: The wound occurred when the patient had a injury with a blunt object around 08/07/2015 Modifying Factors: a large swelling with drainage Associated Signs and Symptoms: Patient reports having increase swelling. HPI Description: 75 year old gentleman with known history of diabetes mellitus without complication had  a lacerated wound to his right lower extremity which was repaired about 4 weeks ago. Was seen by his PCP Dr. Donneta Romberg with a open wound on the right lateral leg with some discharge and erythema and he was placed on a blocks and local dressing was applied and referred to [place] for further care. Past medical history significant for diabetes mellitus type 2, coronary artery disease, hypertension, arthritis, depression, foot surgery o2, back surgery, CABG, TURP, right total knee replacement. He is a former smoker and quit in 1994. On questioning the patient he gives a history that he had a blunt injury to his right leg and this happened on April 15 of this year and about 2 weeks later he had blood draining from here and had to go to the ER and have sutures placed. However on looking at his medical records he was in the ER on 05/17/2015 with a blunt injury on his right lower extremity and at that stage x-ray showed no fracture and the right lower leg ultrasound showed no evidence of DVT. Diagnosis of a hematoma was made and he was given symptomatic treatment for this. The next time he was seen in the emergency room was on 08/11/2015 when the patient had a 10 cm laceration to the right lateral leg and this was sutured by the ER physician Dr. Dahlia Client. His blood alcohol level was 162 mg percent that day and the sutures were to be removed in 10 days' time. 09/23/2015 -- had his ultrasound of the left lower extremity for possible hematoma and the impression was there was no discrete drainable hematoma in the area of clinical concern on the left lower extremity. On reviewing his history he clearly states that he never had a large lacerated wound due to the injury or fall on April 19. He says he did have a few drinks but has not drunk and the hematoma which he had for a couple of months just burst open and caused  the big bleed and extrusion of clots. Electronic Signature(s) Signed: 11/22/2015 2:53:03 PM By:  Christin Fudge MD, FACS Entered By: Christin Fudge on 11/22/2015 14:53:02 Seth Mcintosh ([id number]) -------------------------------------------------------------------------------- Physical Exam Details Patient Name: Seth Mcintosh Date of Service: 11/22/2015 2:15 PM Medical Record Number: [id number] Patient Account Number: [id number] Date of Birth/Sex: 08-Jan-1941 (75 y.o. Male) Treating RN: Ahmed Prima Primary Care Physician: [facility], [place] Der Other Clinician: Referring Physician: [facility], [facility] Treating Physician/Extender: Frann Rider in Treatment: 9 Constitutional . Pulse regular. Respirations normal and unlabored. Afebrile. . Eyes Nonicteric. Reactive to light. Ears, Nose, Mouth, and Throat Lips, teeth, and gums WNL.Marland Kitchen Moist mucosa without lesions. Neck supple and nontender. No palpable supraclavicular or cervical adenopathy. Normal sized without goiter. Respiratory WNL. No retractions.. Cardiovascular Pedal Pulses WNL. No clubbing, cyanosis or edema. Lymphatic No adneopathy. No adenopathy. No adenopathy. Musculoskeletal Adexa without tenderness or enlargement.. Digits and nails w/o clubbing, cyanosis, infection, petechiae, ischemia, or inflammatory conditions.. Integumentary (Hair, Skin) No suspicious lesions. No crepitus or fluctuance. No peri-wound warmth or erythema. No masses.Marland Kitchen Psychiatric Judgement and insight Intact.. No evidence of depression, anxiety, or agitation.. Notes wound overall is improved a lot and the size and the depth has significantly decreased. The lymphedema is also minimal. Electronic Signature(s) Signed: 11/22/2015 2:53:34 PM By: Christin Fudge MD, FACS Entered By: Christin Fudge on 11/22/2015 14:53:33 Seth Mcintosh ([id number]) -------------------------------------------------------------------------------- Physician Orders Details Patient Name: Seth Mcintosh Date of Service: 11/22/2015 2:15 PM Medical Record Number:  [id number] Patient Account Number: [id number] Date of Birth/Sex: 1940-10-17 (75 y.o. Male) Treating RN: Ahmed Prima Primary Care Physician: [facility], [place]ossie Der Other Clinician: Referring Physician: [facility], [place] Der Treating Physician/Extender: Frann Rider in Treatment: 9 Verbal / Phone Orders: Yes ClinicianCarolyne Fiscal, Debi Read Back and Verified: Yes Diagnosis Coding Wound Cleansing Wound #1 Right,Lateral Lower Leg o Cleanse wound with mild soap and water o May Shower, gently pat wound dry prior to applying new dressing. Anesthetic Wound #1 Right,Lateral Lower Leg o Topical Lidocaine 4% cream applied to wound bed prior to debridement Skin Barriers/Peri-Wound Care Wound #1 Right,Lateral Lower Leg o Skin Prep Primary Wound Dressing Wound #1 Right,Lateral Lower Leg o Other: - RTD Secondary Dressing Wound #1 Right,Lateral Lower Leg o Non-adherent pad - telfa island dressing Dressing Change Frequency Wound #1 Right,Lateral Lower Leg o Change dressing every day. Edema Control Wound #1 Right,Lateral Lower Leg o Elevate legs to the level of the heart and pump ankles as often as possible o Support Garment 20-30 mm/Hg pressure to: - wear compression stockings Additional Orders / Instructions Wound #1 Right,Lateral Lower Leg o Increase protein intake. BARRETT, WORLDS ([id number]) Electronic Signature(s) Signed: 11/22/2015 4:24:16 PM By: Christin Fudge MD, FACS Signed: 11/22/2015 5:30:24 PM By: Alric Quan Entered By: Alric Quan on 11/22/2015 14:52:50 Seth Mcintosh ([id number]) -------------------------------------------------------------------------------- Problem List Details Patient Name: Seth Mcintosh Date of Service: 11/22/2015 2:15 PM Medical Record Number: [id number] Patient Account Number: [id number] Date of Birth/Sex: Mar 23, 1941 (75 y.o. 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 Male) Treating RN: Ahmed Prima Primary Care Physician: [facility], [place]ie Der Other Clinician: Referring Physician: [facility], [place]ossie Der Treating Physician/Extender: Frann Rider in Treatment: 9 Subjective Chief Complaint Information obtained from Patient Patient seen for complaints of Non-Healing Wound which she's had on the right lower extremity for about 5 weeks History of Present Illness (HPI) The following HPI elements were documented for the patient's wound: Location: open wound right lower extremity lateral leg Quality: Patient reports experiencing a dull pain to affected area(s). Severity: Patient states wound are getting worse. Duration: Patient has had the wound for > 6 weeks prior to seeking treatment at the wound center Timing: Pain in wound is Intermittent (comes and goes Context: The wound occurred when the patient had a injury with a blunt object around 08/07/2015 Modifying Factors: a large swelling with drainage Associated Signs and Symptoms: Patient reports having increase  swelling. 75 year old gentleman with known history of diabetes mellitus without complication had a lacerated wound to his right lower extremity which was repaired about 4 weeks ago. Was seen by his PCP Dr. Donneta Romberg with a open wound on the right lateral leg with some discharge and erythema and he was placed on a blocks and local dressing was applied and referred to [place] for further care. Past medical history significant for diabetes mellitus type 2, coronary artery disease, hypertension, arthritis, depression, foot surgery o2, back surgery, CABG, TURP, right total knee replacement. He is a former smoker and quit in 1994. On questioning the patient he gives a history that he had a blunt injury to his right leg and this happened on April 15 of this year and about 2 weeks later he had blood draining from here and had to go to the ER and have sutures placed. However on looking at his medical records he was in the ER on 05/17/2015 with a blunt injury on his right lower extremity and at that stage x-ray showed no fracture and the right lower leg ultrasound showed no evidence of DVT. Diagnosis of a hematoma was made and he was given symptomatic treatment for this. The next time he was seen in the emergency room was on 08/11/2015 when the patient had a 10 cm laceration to the right lateral leg and this was sutured by the ER physician Dr. Dahlia Client. His blood alcohol level was 162 mg percent that day and the sutures were to be removed in 10 days' time. 09/23/2015 -- had his ultrasound of the left lower extremity for possible hematoma and the impression was there was no discrete drainable hematoma in the area of clinical concern on the left lower extremity. On reviewing his history he clearly states that he never had a large lacerated wound due to the injury or fall on April 19. He says he did have a few drinks but has not drunk and the hematoma which he had for a SOHRAB, VANNELLI. ([id number]) couple of  months just burst open and caused the big bleed and extrusion of clots. Objective Constitutional Pulse regular. Respirations normal and unlabored. Afebrile. Vitals Time Taken: 2:33 PM, Height: 74 in, Weight: 265 lbs, BMI: 34, Temperature: 97.8 F, Pulse: 58 bpm, Respiratory Rate: 18 breaths/min, Blood Pressure: 115/62 mmHg. Eyes Nonicteric. Reactive to light. Ears, Nose, Mouth, and Throat Lips, teeth, and gums WNL.Marland Kitchen  Moist mucosa without lesions. Neck supple and nontender. No palpable supraclavicular or cervical adenopathy. Normal sized without goiter. Respiratory WNL. No retractions.. Cardiovascular Pedal Pulses WNL. No clubbing, cyanosis or edema. Lymphatic No adneopathy. No adenopathy. No adenopathy. Musculoskeletal Adexa without tenderness or enlargement.. Digits and nails w/o clubbing, cyanosis, infection, petechiae, ischemia, or inflammatory conditions.Marland Kitchen Psychiatric Judgement and insight Intact.. No evidence of depression, anxiety, or agitation.. General Notes: wound overall is improved a lot and the size and the depth has significantly decreased. The lymphedema is also minimal. Integumentary (Hair, Skin) No suspicious lesions. No crepitus or fluctuance. No peri-wound warmth or erythema. No masses.. Wound #1 status is Open. Original cause of wound was Trauma. The wound is located on the Right,Lateral Lower Leg. The wound measures 1.7cm length x 0.7cm width x 0.3cm depth; 0.935cm^2 area and CEVON, OSTERGARD E. ([id number]) 0.28cm^3 volume. The wound is limited to skin breakdown. There is no tunneling noted, however, there is undermining starting at 3:00 and ending at 6:00 with a maximum distance of 1.9cm. There is a large amount of serosanguineous drainage noted. The wound margin is thickened. There is large (67-100%) red granulation within the wound bed. There is a small (1-33%) amount of necrotic tissue within the wound bed including Adherent Slough. The periwound skin  appearance exhibited: Localized Edema, Moist. Periwound temperature was noted as No Abnormality. The periwound has tenderness on palpation. 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 Anesthetic: Wound #1 Right,Lateral Lower Leg: Topical Lidocaine 4% cream applied to wound bed prior to debridement Skin Barriers/Peri-Wound Care: Wound #1 Right,Lateral Lower Leg: Skin Prep Primary Wound Dressing: Wound #1 Right,Lateral Lower Leg: Other: - RTD Secondary Dressing: Wound #1 Right,Lateral Lower Leg: Non-adherent pad - telfa island dressing Dressing Change Frequency: Wound #1 Right,Lateral Lower Leg: Change dressing every day. Edema Control: Wound #1 Right,Lateral Lower Leg: Elevate legs to the level of the heart and pump ankles as often as possible Support Garment 20-30 mm/Hg pressure to: - wear compression stockings Additional Orders / Instructions: KARLIS, COURTENAY ([id number]) Wound #1 Right,Lateral Lower Leg: Increase protein intake. 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 We will dress the wound with RTD strips and continue local care. I'm pleased with the progress this week and he is going to be away next week on vacation and will be back the week after Electronic Signature(s) Signed: 12/06/2015 9:39:19 AM By: Christin Fudge MD, FACS Previous Signature: 11/22/2015 2:54:29 PM Version By: Christin Fudge MD, FACS Entered By: Christin Fudge on 12/06/2015  09:39:18 Seth Mcintosh ([id number]) -------------------------------------------------------------------------------- SuperBill Details Patient Name: Seth Mcintosh Date of Service: 11/22/2015 Medical Record Number: [id number] Patient Account Number: [id number] Date of Birth/Sex: 1940-11-25 (74 y.o. Male) Treating RN: Ahmed Prima Primary Care Physician: [facility], [place]ie Der Other Clinician: Referring Physician: [facility], [place]ossie Der Treating Physician/Extender: Frann Rider in Treatment: 9 Diagnosis Coding ICD-10 Codes Code Description E11.622 Type 2 diabetes mellitus with other skin ulcer L97.213 Non-pressure chronic ulcer of right calf with necrosis of muscle M70.861 Other  soft tissue disorders related to use, overuse and pressure, right lower leg Facility Procedures CPT4 Code: [id number] Description: 99213 - WOUND CARE VISIT-LEV 3 EST PT Modifier: Quantity: 1 Physician Procedures CPT4: Description Modifier Quantity Code [id number] - WC PHYS LEVEL 3 - EST PT 1 ICD-10 Description Diagnosis E11.622 Type 2 diabetes mellitus with other skin ulcer L97.213 Non-pressure chronic ulcer of right calf with necrosis of muscle M70.861 Other  soft tissue disorders related to use, overuse and pressure, right lower leg Electronic Signature(s) Signed: 11/22/2015 5:30:24 PM By: Alric Quan Signed: 11/25/2015 4:34:24 PM By: Christin Fudge MD, FACS Previous Signature: 11/22/2015 2:54:42 PM Version By: Christin Fudge MD, FACS Entered By: Alric Quan on 11/22/2015 16:33:48

## 2015-12-01 ENCOUNTER — Encounter: Payer: Self-pay | Admitting: Family Medicine

## 2015-12-06 ENCOUNTER — Encounter: Payer: Medicare Other | Attending: Surgery | Admitting: Surgery

## 2015-12-06 DIAGNOSIS — Z87891 Personal history of nicotine dependence: Secondary | ICD-10-CM | POA: Diagnosis not present

## 2015-12-06 DIAGNOSIS — S81801A Unspecified open wound, right lower leg, initial encounter: Secondary | ICD-10-CM | POA: Diagnosis not present

## 2015-12-06 DIAGNOSIS — M1990 Unspecified osteoarthritis, unspecified site: Secondary | ICD-10-CM | POA: Diagnosis not present

## 2015-12-06 DIAGNOSIS — I890 Lymphedema, not elsewhere classified: Secondary | ICD-10-CM | POA: Diagnosis not present

## 2015-12-06 DIAGNOSIS — G4730 Sleep apnea, unspecified: Secondary | ICD-10-CM | POA: Insufficient documentation

## 2015-12-06 DIAGNOSIS — L97213 Non-pressure chronic ulcer of right calf with necrosis of muscle: Secondary | ICD-10-CM | POA: Insufficient documentation

## 2015-12-06 DIAGNOSIS — M70861 Other soft tissue disorders related to use, overuse and pressure, right lower leg: Secondary | ICD-10-CM | POA: Insufficient documentation

## 2015-12-06 DIAGNOSIS — E11622 Type 2 diabetes mellitus with other skin ulcer: Secondary | ICD-10-CM | POA: Diagnosis not present

## 2015-12-06 DIAGNOSIS — I2510 Atherosclerotic heart disease of native coronary artery without angina pectoris: Secondary | ICD-10-CM | POA: Insufficient documentation

## 2015-12-06 DIAGNOSIS — K219 Gastro-esophageal reflux disease without esophagitis: Secondary | ICD-10-CM | POA: Diagnosis not present

## 2015-12-06 DIAGNOSIS — I10 Essential (primary) hypertension: Secondary | ICD-10-CM | POA: Insufficient documentation

## 2015-12-06 DIAGNOSIS — N400 Enlarged prostate without lower urinary tract symptoms: Secondary | ICD-10-CM | POA: Diagnosis not present

## 2015-12-06 DIAGNOSIS — F329 Major depressive disorder, single episode, unspecified: Secondary | ICD-10-CM | POA: Insufficient documentation

## 2015-12-07 ENCOUNTER — Telehealth: Payer: Self-pay | Admitting: Urology

## 2015-12-07 NOTE — Telephone Encounter (Signed)
Pt is on 10 mg of Vesicare and is having severe constipation.  He wants to know if he can try some 5 mg Vesicare if we have any samples.  Please give pt a call

## 2015-12-08 DIAGNOSIS — H2513 Age-related nuclear cataract, bilateral: Secondary | ICD-10-CM | POA: Diagnosis not present

## 2015-12-10 NOTE — Progress Notes (Signed)
Seth Mcintosh, Seth Mcintosh ([id number]) Visit Report for 12/06/2015 Arrival Information Details Patient Name: Seth Mcintosh, Seth Mcintosh Date of Service: 12/06/2015 8:45 AM Medical Record Number: [id number] Patient Account Number: [id number] Date of Birth/Sex: June 03, 1940 (75 y.o. Male) Treating RN: Cornell Barman Primary Care Physician: [facility], Dossie Der Other Clinician: Referring Physician: [facility], Dossie Der Treating Physician/Extender: Frann Rider in Treatment: 11 Visit Information History Since Last Visit Added or deleted any medications: No Patient Arrived: Ambulatory Any new allergies or adverse reactions: No Arrival Time: 08:49 Had a fall or experienced change in No Accompanied By: self activities of daily living that may affect Transfer Assistance: None risk of falls: Patient Identification Verified: Yes Signs or symptoms of abuse/neglect since last No Secondary Verification Process Yes visito Completed: Hospitalized since last visit: No Patient Requires Transmission-Based No Has Dressing in Place as Prescribed: Yes Precautions: Has Compression in Place as Prescribed: Yes Patient Has Alerts: Yes Pain Present Now: No Patient Alerts: DM II Electronic Signature(s) Signed: 12/09/2015 5:06:33 PM By: Gretta Cool, RN, BSN, Kim RN, BSN Entered By: Gretta Cool, RN, BSN, Kim on 12/06/2015 08:50:12 Seth Mcintosh ([id number]) -------------------------------------------------------------------------------- Clinic Level of Care Assessment Details Patient Name: Seth Mcintosh Date of Service: 12/06/2015 8:45 AM Medical Record Number: [id number] Patient Account Number: [id number] Date of Birth/Sex: [birth year] (75 y.o. Male) Treating RN: Cornell Barman Primary Care Physician: [facility], Dossie Der Other Clinician: Referring Physician: [facility], Dossie Der Treating Physician/Extender: Frann Rider in Treatment: 11 Clinic Level of Care Assessment Items TOOL 4 Quantity Score []  - Use when only an EandM is performed on FOLLOW-UP visit  0 ASSESSMENTS - Nursing Assessment / Reassessment []  - Reassessment of Co-morbidities (includes updates in patient status) 0 X - Reassessment of Adherence to Treatment Plan 1 5 ASSESSMENTS - Wound and Skin Assessment / Reassessment X - Simple Wound Assessment / Reassessment - one wound 1 5 []  - Complex Wound Assessment / Reassessment - multiple wounds 0 []  - Dermatologic / Skin Assessment (not related to wound area) 0 ASSESSMENTS - Focused Assessment []  - Circumferential Edema Measurements - multi extremities 0 []  - Nutritional Assessment / Counseling / Intervention 0 []  - Lower Extremity Assessment (monofilament, tuning fork, pulses) 0 []  - Peripheral Arterial Disease Assessment (using hand held doppler) 0 ASSESSMENTS - Ostomy and/or Continence Assessment and Care []  - Incontinence Assessment and Management 0 []  - Ostomy Care Assessment and Management (repouching, etc.) 0 PROCESS - Coordination of Care X - Simple Patient / Family Education for ongoing care 1 15 []  - Complex (extensive) Patient / Family Education for ongoing care 0 X - Staff obtains Programmer, systems, Records, Test Results / Process Orders 1 10 []  - Staff telephones HHA, Nursing Homes / Clarify orders / etc 0 []  - Routine Transfer to another Facility (non-emergent condition) 0 Seth Mcintosh, Seth Mcintosh ([id number]) []  - Routine Hospital Admission (non-emergent condition) 0 []  - New Admissions / Biomedical engineer / Ordering NPWT, Apligraf, etc. 0 []  - Emergency Hospital Admission (emergent condition) 0 X - Simple Discharge Coordination 1 10 []  - Complex (extensive) Discharge Coordination 0 PROCESS - Special Needs []  - Pediatric / Minor Patient Management 0 []  - Isolation Patient Management 0 []  - Hearing / Language / Visual special needs 0 []  - Assessment of Community assistance (transportation, D/C planning, etc.) 0 []  - Additional assistance / Altered mentation 0 []  - Support Surface(s) Assessment (bed, cushion, seat, etc.)  0 INTERVENTIONS - Wound Cleansing / Measurement X - Simple Wound Cleansing - one wound 1 5 []  - Complex Wound Cleansing - multiple  wounds 0 X - Wound Imaging (photographs - any number of wounds) 1 5 []  - Wound Tracing (instead of photographs) 0 X - Simple Wound Measurement - one wound 1 5 []  - Complex Wound Measurement - multiple wounds 0 INTERVENTIONS - Wound Dressings X - Small Wound Dressing one or multiple wounds 1 10 []  - Medium Wound Dressing one or multiple wounds 0 []  - Large Wound Dressing one or multiple wounds 0 []  - Application of Medications - topical 0 []  - Application of Medications - injection 0 INTERVENTIONS - Miscellaneous []  - External ear exam 0 Seth Mcintosh, Seth Mcintosh ([id number]) []  - Specimen Collection (cultures, biopsies, blood, body fluids, etc.) 0 []  - Specimen(s) / Culture(s) sent or taken to Lab for analysis 0 []  - Patient Transfer (multiple staff / Harrel Lemon Lift / Similar devices) 0 []  - Simple Staple / Suture removal (25 or less) 0 []  - Complex Staple / Suture removal (26 or more) 0 []  - Hypo / Hyperglycemic Management (close monitor of Blood Glucose) 0 []  - Ankle / Brachial Index (ABI) - do not check if billed separately 0 X - Vital Signs 1 5 Has the patient been seen at the hospital within the last three years: Yes Total Score: 75 Level Of Care: New/Established - Level 2 Electronic Signature(s) Signed: 12/09/2015 5:06:33 PM By: Gretta Cool, RN, BSN, Kim RN, BSN Entered By: Gretta Cool, RN, BSN, Kim on 12/06/2015 09:07:31 Seth Mcintosh ([id number]) -------------------------------------------------------------------------------- Encounter Discharge Information Details Patient Name: Seth Mcintosh Date of Service: 12/06/2015 8:45 AM Medical Record Number: [id number] Patient Account Number: [id number] Date of Birth/Sex: Oct 09, 1940 (75 y.o. Male) Treating RN: Cornell Barman Primary Care Physician: [facility], Dossie Der Other Clinician: Referring Physician: [facility], Dossie Der Treating  Physician/Extender: Frann Rider in Treatment: 11 Encounter Discharge Information Items Discharge Pain Level: 0 Discharge Condition: Stable Ambulatory Status: Ambulatory Discharge Destination: Home Private Transportation: Auto Accompanied By: self Schedule Follow-up Appointment: Yes Medication Reconciliation completed and Yes provided to Patient/Care Jazmen Lindenbaum: Clinical Summary of Care: Electronic Signature(s) Signed: 12/09/2015 5:06:33 PM By: Gretta Cool, RN, BSN, Kim RN, BSN Previous Signature: 12/06/2015 9:08:34 AM Version By: Ruthine Dose Entered By: Gretta Cool RN, BSN, Kim on 12/06/2015 09:08:42 Seth Mcintosh ([id number]) -------------------------------------------------------------------------------- Lower Extremity Assessment Details Patient Name: Seth Mcintosh Date of Service: 12/06/2015 8:45 AM Medical Record Number: [id number] Patient Account Number: [id number] Date of Birth/Sex: 07-23-1940 (75 y.o. Male) Treating RN: Cornell Barman Primary Care Physician: [facility], Dossie Der Other Clinician: Referring Physician: [facility], Dossie Der Treating Physician/Extender: Frann Rider in Treatment: 11 Vascular Assessment Pulses: Posterior Tibial Dorsalis Pedis Palpable: [Right:Yes] Extremity colors, hair growth, and conditions: Extremity Color: [Right:Normal] Hair Growth on Extremity: [Right:Yes] Temperature of Extremity: [Right:Warm] Capillary Refill: [Right:< 3 seconds] Electronic Signature(s) Signed: 12/09/2015 5:06:33 PM By: Gretta Cool, RN, BSN, Kim RN, BSN Entered By: Gretta Cool, RN, BSN, Kim on 12/06/2015 08:53:30 Seth Mcintosh ([id number]) -------------------------------------------------------------------------------- Multi Wound Chart Details Patient Name: Seth Mcintosh Date of Service: 12/06/2015 8:45 AM Medical Record Number: [id number] Patient Account Number: [id number] Date of Birth/Sex: [birth year] (75 y.o. Male) Treating RN: Cornell Barman Primary Care Physician: [facility],  Dossie Der Other Clinician: Referring Physician: [facility], [facility] Treating Physician/Extender: Frann Rider in Treatment: 11 Vital Signs Height(in): 74 Pulse(bpm): 80 Weight(lbs): 265 Blood Pressure 109/70 (mmHg): Body Mass Index(BMI): 34 Temperature(F): 97.9 Respiratory Rate 18 (breaths/min): Photos: [N/A:N/A] Wound Location: Right Lower Leg - Lateral N/A N/A Wounding Event: Trauma N/A N/A Primary Etiology: Trauma, Other N/A N/A Comorbid History: Glaucoma, Sleep Apnea, N/A N/A Coronary Artery Disease, Hypertension, Type  II Diabetes, Osteoarthritis Date Acquired: 08/07/2015 N/A N/A Weeks of Treatment: 11 N/A N/A Wound Status: Open N/A N/A Measurements L x W x D 1.2x0.7x0.4 N/A N/A (cm) Area (cm) : 0.66 N/A N/A Volume (cm) : 0.264 N/A N/A % Reduction in Area: 83.20% N/A N/A % Reduction in Volume: 90.40% N/A N/A Position 1 (o'clock): 6 Maximum Distance 1 0.5 (cm): Tunneling: Yes N/A N/A Classification: Full Thickness With N/A N/A Exposed Support Structures Seth Mcintosh, Seth Mcintosh ([id number]) HBO Classification: Grade 1 N/A N/A Exudate Amount: Medium N/A N/A Exudate Type: Serosanguineous N/A N/A Exudate Color: red, brown N/A N/A Wound Margin: Thickened N/A N/A Granulation Amount: Large (67-100%) N/A N/A Granulation Quality: Red N/A N/A Necrotic Amount: Small (1-33%) N/A N/A Exposed Structures: Fascia: No N/A N/A Fat: No Tendon: No Muscle: No Joint: No Bone: No Limited to Skin Breakdown Epithelialization: None N/A N/A Periwound Skin Texture: Induration: Yes N/A N/A Edema: No Excoriation: No Callus: No Crepitus: No Fluctuance: No Friable: No Rash: No Scarring: No Periwound Skin Maceration: No N/A N/A Moisture: Moist: No Dry/Scaly: No Periwound Skin Color: Erythema: Yes N/A N/A Atrophie Blanche: No Cyanosis: No Ecchymosis: No Hemosiderin Staining: No Mottled: No Pallor: No Rubor: No Temperature: No Abnormality N/A N/A Tenderness on Yes N/A  N/A Palpation: Wound Preparation: Ulcer Cleansing: N/A N/A Rinsed/Irrigated with Saline Topical Anesthetic Applied: Other: lidocaine 4% Treatment Notes Seth Mcintosh, Seth Mcintosh ([id number]) Electronic Signature(s) Signed: 12/09/2015 5:06:33 PM By: Gretta Cool, RN, BSN, Kim RN, BSN Entered By: Gretta Cool, RN, BSN, Kim on 12/06/2015 09:00:49 Seth Mcintosh ([id number]) -------------------------------------------------------------------------------- Multi-Disciplinary Care Plan Details Patient Name: Seth Mcintosh Date of Service: 12/06/2015 8:45 AM Medical Record Number: [id number] Patient Account Number: [id number] Date of Birth/Sex: April 01, 1941 (75 y.o. Male) Treating RN: Cornell Barman Primary Care Physician: [facility], Dossie Der Other Clinician: Referring Physician: Keith Rake Treating Physician/Extender: Frann Rider in Treatment: 11 Active Inactive Abuse / Safety / Falls / Self Care Management Nursing Diagnoses: Potential for falls Goals: Patient will remain injury free Date Initiated: 09/16/2015 Goal Status: Active Interventions: Assess fall risk on admission and as needed Notes: Orientation to the Wound Care Program Nursing Diagnoses: Knowledge deficit related to the wound healing center program Goals: Patient/caregiver will verbalize understanding of the [place] Program Date Initiated: 09/16/2015 Goal Status: Active Interventions: Provide education on orientation to the wound center Notes: Pain, Acute or Chronic Nursing Diagnoses: Pain, acute or chronic: actual or potential Potential alteration in comfort, pain Goals: Seth Mcintosh, Seth Mcintosh ([id number]) Patient will verbalize adequate pain control and receive pain control interventions during procedures as needed Date Initiated: 09/16/2015 Goal Status: Active Interventions: Assess comfort goal upon admission Complete pain assessment as per visit requirements Notes: Soft Tissue Infection Nursing Diagnoses: Impaired  tissue integrity Goals: Patient will remain free of wound infection Date Initiated: 09/16/2015 Goal Status: Active Interventions: Assess signs and symptoms of infection every visit Notes: Wound/Skin Impairment Nursing Diagnoses: Impaired tissue integrity Goals: Ulcer/skin breakdown will have a volume reduction of 30% by week 4 Date Initiated: 09/16/2015 Goal Status: Active Ulcer/skin breakdown will have a volume reduction of 50% by week 8 Date Initiated: 09/16/2015 Goal Status: Active Ulcer/skin breakdown will have a volume reduction of 80% by week 12 Date Initiated: 09/16/2015 Goal Status: Active Interventions: Assess patient/caregiver ability to obtain necessary supplies Assess ulceration(s) every visit Notes: Seth Mcintosh, Seth Mcintosh ([id number]) Electronic Signature(s) Signed: 12/09/2015 5:06:33 PM By: Gretta Cool, RN, BSN, Kim RN, BSN Entered By: Gretta Cool, RN, BSN, Kim on 12/06/2015 08:56:42 Seth Mcintosh ([id number]) -------------------------------------------------------------------------------- Pain Assessment  Details Patient Name: Seth Mcintosh, Seth Mcintosh Date of Service: 12/06/2015 8:45 AM Medical Record Number: [id number] Patient Account Number: [id number] Date of Birth/Sex: 1941/02/08 (75 y.o. Male) Treating RN: Cornell Barman Primary Care Physician: [facility], Dossie Der Other Clinician: Referring Physician: [facility], Dossie Der Treating Physician/Extender: Frann Rider in Treatment: 11 Active Problems Location of Pain Severity and Description of Pain Patient Has Paino No Site Locations With Dressing Change: No Pain Management and Medication Current Pain Management: Electronic Signature(s) Signed: 12/09/2015 5:06:33 PM By: Gretta Cool, RN, BSN, Kim RN, BSN Entered By: Gretta Cool, RN, BSN, Kim on 12/06/2015 08:50:21 Seth Mcintosh ([id number]) -------------------------------------------------------------------------------- Patient/Caregiver Education Details Patient Name: Seth Mcintosh Date of  Service: 12/06/2015 8:45 AM Medical Record Number: [id number] Patient Account Number: [id number] Date of Birth/Gender: [birth year] (75 y.o. Male) Treating RN: Cornell Barman Primary Care Physician: [facility], Dossie Der Other Clinician: Referring Physician: Keith Rake Treating Physician/Extender: Frann Rider in Treatment: 11 Education Assessment Education Provided To: Patient Education Topics Provided Wound/Skin Impairment: Handouts: Caring for Your Ulcer, Other: wound care as prescribed Methods: Demonstration, Explain/Verbal Responses: State content correctly Electronic Signature(s) Signed: 12/09/2015 5:06:33 PM By: Gretta Cool, RN, BSN, Kim RN, BSN Entered By: Gretta Cool, RN, BSN, Kim on 12/06/2015 09:09:09 Seth Mcintosh ([id number]) -------------------------------------------------------------------------------- Wound Assessment Details Patient Name: Seth Mcintosh Date of Service: 12/06/2015 8:45 AM Medical Record Number: [id number] Patient Account Number: [id number] Date of Birth/Sex: 1940-09-20 (75 y.o. Male) Treating RN: Cornell Barman Primary Care Physician: [facility], Dossie Der Other Clinician: Referring Physician: [facility], Dossie Der Treating Physician/Extender: Frann Rider in Treatment: 11 Wound Status Wound Number: 1 Primary Trauma, Other Etiology: Wound Location: Right Lower Leg - Lateral Wound Open Wounding Event: Trauma Status: Date Acquired: 08/07/2015 Comorbid Glaucoma, Sleep Apnea, Coronary Weeks Of Treatment: 11 History: Artery Disease, Hypertension, Type II Clustered Wound: No Diabetes, Osteoarthritis Photos Wound Measurements Length: (cm) 1.2 Width: (cm) 0.7 Depth: (cm) 0.4 Area: (cm) 0.66 Volume: (cm) 0.264 % Reduction in Area: 83.2% % Reduction in Volume: 90.4% Epithelialization: None Tunneling: Yes Position (o'clock): 6 Maximum Distance: (cm) 0.5 Wound Description Full Thickness With Exposed Classification: Support Structures Diabetic Severity Grade  1 (Wagner): Wound Margin: Thickened Exudate Amount: Medium Exudate Type: Serosanguineous Exudate Color: red, brown Foul Odor After Cleansing: No Wound Bed Seth Mcintosh, Seth Mcintosh ([id number]) Granulation Amount: Large (67-100%) Exposed Structure Granulation Quality: Red Fascia Exposed: No Necrotic Amount: Small (1-33%) Fat Layer Exposed: No Necrotic Quality: Adherent Slough Tendon Exposed: No Muscle Exposed: No Joint Exposed: No Bone Exposed: No Limited to Skin Breakdown Periwound Skin Texture Texture Color No Abnormalities Noted: No No Abnormalities Noted: No Callus: No Atrophie Blanche: No Crepitus: No Cyanosis: No Excoriation: No Ecchymosis: No Fluctuance: No Erythema: Yes Friable: No Hemosiderin Staining: No Induration: Yes Mottled: No Localized Edema: No Pallor: No Rash: No Rubor: No Scarring: No Temperature / Pain Moisture Temperature: No Abnormality No Abnormalities Noted: No Tenderness on Palpation: Yes Dry / Scaly: No Maceration: No Moist: No Wound Preparation Ulcer Cleansing: Rinsed/Irrigated with Saline Topical Anesthetic Applied: Other: lidocaine 4%, Treatment Notes Wound #1 (Right, Lateral Lower Leg) 1. Cleansed with: Clean wound with Normal Saline 2. Anesthetic Topical Lidocaine 4% cream to wound bed prior to debridement 3. Peri-wound Care: Skin Prep 4. Dressing Applied: Prisma Ag 5. Secondary Dressing Applied Bordered Foam Dressing 7. Secured with Patient to wear own compression stockings Seth Mcintosh, Seth Mcintosh ([id number]) Electronic Signature(s) Signed: 12/09/2015 5:06:33 PM By: Gretta Cool, RN, BSN, Kim RN, BSN Entered By: Gretta Cool, RN, BSN, Kim on 12/06/2015 08:55:56 Seth Mcintosh ([id number]) -------------------------------------------------------------------------------- Vitals Details  Patient Name: Seth Mcintosh, Seth Mcintosh Date of Service: 12/06/2015 8:45 AM Medical Record Number: [id number] Patient Account Number: [id number] Date of Birth/Sex:  12-16-1940 (75 y.o. Male) Treating RN: Cornell Barman Primary Care Physician: [facility], [facility] Other Clinician: Referring Physician: [facility], Dossie Der Treating Physician/Extender: Frann Rider in Treatment: 11 Vital Signs Time Taken: 08:50 Temperature (F): 97.9 Height (in): 74 Pulse (bpm): 80 Weight (lbs): 265 Respiratory Rate (breaths/min): 18 Body Mass Index (BMI): 34 Blood Pressure (mmHg): 109/70 Reference Range: 80 - 120 mg / dl Electronic Signature(s) Signed: 12/09/2015 5:06:33 PM By: Gretta Cool, RN, BSN, Kim RN, BSN Entered By: Gretta Cool, RN, BSN, Kim on 12/06/2015 08:50:44

## 2015-12-10 NOTE — Progress Notes (Signed)
Seth, Mcintosh ([id number]) Visit Report for 12/06/2015 Chief Complaint Document Details Patient Name: Seth Mcintosh, Seth Mcintosh Date of Service: 12/06/2015 8:45 AM Medical Record Number: [id number] Patient Account Number: [id number] Date of Birth/Sex: [birth year] (75 y.o. Male) Treating RN: Cornell Barman Primary Care Physician: [facility], Dossie Der Other Clinician: Referring Physician: Keith Rake Treating Physician/Extender: Frann Rider in Treatment: 11 Information Obtained from: Patient Chief Complaint Patient seen for complaints of Non-Healing Wound which she's had on the right lower extremity for about 5 weeks Electronic Signature(s) Signed: 12/06/2015 9:41:59 AM By: Christin Fudge MD, FACS Entered By: Christin Fudge on 12/06/2015 09:41:59 Seth Mcintosh ([id number]) -------------------------------------------------------------------------------- HPI Details Patient Name: Seth Mcintosh Date of Service: 12/06/2015 8:45 AM Medical Record Number: [id number] Patient Account Number: [id number] Date of Birth/Sex: 28-Nov-1940 (75 y.o. Male) Treating RN: Cornell Barman Primary Care Physician: [facility], [place]ossie Der Other Clinician: Referring Physician: [facility], [place]ossie Der Treating Physician/Extender: Frann Rider in Treatment: 11 History of Present Illness Location: open wound right lower extremity lateral leg Quality: Patient reports experiencing a dull pain to affected area(s). Severity: Patient states wound are getting worse. Duration: Patient has had the wound for > 6 weeks prior to seeking treatment at the wound center Timing: Pain in wound is Intermittent (comes and goes Context: The wound occurred when the patient had a injury with a blunt object around 08/07/2015 Modifying Factors: a large swelling with drainage Associated Signs and Symptoms: Patient reports having increase swelling. HPI Description: 75 year old gentleman with known history of diabetes mellitus without complication had a lacerated wound  to his right lower extremity which was repaired about 4 weeks ago. Was seen by his PCP Dr. Donneta Romberg with a open wound on the right lateral leg with some discharge and erythema and he was placed on a blocks and local dressing was applied and referred to [place] for further care. Past medical history significant for diabetes mellitus type 2, coronary artery disease, hypertension, arthritis, depression, foot surgery o2, back surgery, CABG, TURP, right total knee replacement. He is a former smoker and quit in 1994. On questioning the patient he gives a history that he had a blunt injury to his right leg and this happened on April 15 of this year and about 2 weeks later he had blood draining from here and had to go to the ER and have sutures placed. However on looking at his medical records he was in the ER on 05/17/2015 with a blunt injury on his right lower extremity and at that stage x-ray showed no fracture and the right lower leg ultrasound showed no evidence of DVT. Diagnosis of a hematoma was made and he was given symptomatic treatment for this. The next time he was seen in the emergency room was on 08/11/2015 when the patient had a 10 cm laceration to the right lateral leg and this was sutured by the ER physician Dr. Dahlia Client. His blood alcohol level was 162 mg percent that day and the sutures were to be removed in 10 days' time. 09/23/2015 -- had his ultrasound of the left lower extremity for possible hematoma and the impression was there was no discrete drainable hematoma in the area of clinical concern on the left lower extremity. On reviewing his history he clearly states that he never had a large lacerated wound due to the injury or fall on April 19. He says he did have a few drinks but has not drunk and the hematoma which he had for a couple of months just burst open and caused  the big bleed and extrusion of clots. Electronic Signature(s) Signed: 12/06/2015 9:42:10 AM By: Christin Fudge MD,  FACS Entered By: Christin Fudge on 12/06/2015 09:42:10 Seth Mcintosh ([id number]) -------------------------------------------------------------------------------- Physical Exam Details Patient Name: Seth Mcintosh Date of Service: 12/06/2015 8:45 AM Medical Record Number: [id number] Patient Account Number: [id number] Date of Birth/Sex: 1940/08/08 (75 y.o. Male) Treating RN: Cornell Barman Primary Care Physician: [facility], [place]ossie Der Other Clinician: Referring Physician: [facility], [place]ossie Der Treating Physician/Extender: Frann Rider in Treatment: 11 Constitutional . Pulse regular. Respirations normal and unlabored. Afebrile. . Eyes Nonicteric. Reactive to light. Ears, Nose, Mouth, and Throat Lips, teeth, and gums WNL.Marland Kitchen Moist mucosa without lesions. Neck supple and nontender. No palpable supraclavicular or cervical adenopathy. Normal sized without goiter. Respiratory WNL. No retractions.. Breath sounds WNL, No rubs, rales, rhonchi, or wheeze.. Cardiovascular Heart rhythm and rate regular, no murmur or gallop.. Pedal Pulses WNL. No clubbing, cyanosis or edema. Lymphatic No adneopathy. No adenopathy. No adenopathy. Musculoskeletal Adexa without tenderness or enlargement.. Digits and nails w/o clubbing, cyanosis, infection, petechiae, ischemia, or inflammatory conditions.. Integumentary (Hair, Skin) No suspicious lesions. No crepitus or fluctuance. No peri-wound warmth or erythema. No masses.Marland Kitchen Psychiatric Judgement and insight Intact.. No evidence of depression, anxiety, or agitation.. Notes there is excellent resolution of his wound dimensions and there is no cellulitis surrounding it. The lymphedema is also looking much better. No debridement was required today. Electronic Signature(s) Signed: 12/06/2015 9:43:48 AM By: Christin Fudge MD, FACS Entered By: Christin Fudge on 12/06/2015 09:43:45 Seth Mcintosh  ([id number]) -------------------------------------------------------------------------------- Physician Orders Details Patient Name: Seth Mcintosh Date of Service: 12/06/2015 8:45 AM Medical Record Number: [id number] Patient Account Number: [id number] Date of Birth/Sex: 02/15/1941 (75 y.o. Male) Treating RN: Cornell Barman Primary Care Physician: [name], Dossie Der Other Clinician: Referring Physician: [facility], Dossie Der Treating Physician/Extender: Frann Rider in Treatment: 14 Verbal / Phone Orders: Yes Clinician: Cornell Barman Read Back and Verified: Yes Diagnosis Coding Wound Cleansing Wound #1 Right,Lateral Lower Leg o Cleanse wound with mild soap and water o May Shower, gently pat wound dry prior to applying new dressing. Anesthetic Wound #1 Right,Lateral Lower Leg o Topical Lidocaine 4% cream applied to wound bed prior to debridement Skin Barriers/Peri-Wound Care Wound #1 Right,Lateral Lower Leg o Skin Prep Primary Wound Dressing Wound #1 Right,Lateral Lower Leg o Prisma Ag Secondary Dressing Wound #1 Right,Lateral Lower Leg o Non-adherent pad - telfa island dressing Dressing Change Frequency Wound #1 Right,Lateral Lower Leg o Change dressing every day. Edema Control Wound #1 Right,Lateral Lower Leg o Elevate legs to the level of the heart and pump ankles as often as possible o Support Garment 20-30 mm/Hg pressure to: - wear compression stockings Additional Orders / Instructions Wound #1 Right,Lateral Lower Leg o Increase protein intake. MAYCEN, NIEMEIER ([id number]) Electronic Signature(s) Signed: 12/06/2015 4:23:27 PM By: Christin Fudge MD, FACS Signed: 12/09/2015 5:06:33 PM By: Gretta Cool RN, BSN, Kim RN, BSN Entered By: Gretta Cool, RN, BSN, Kim on 12/06/2015 09:07:03 Seth Mcintosh ([id number]) -------------------------------------------------------------------------------- Problem List Details Patient Name: Seth Mcintosh Date of Service: 12/06/2015 8:45  AM Medical Record Number: [id number] Patient Account Number: [id number] Date of Birth/Sex: 05-10-1940 (75 y.o. 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 Male) Treating RN: Cornell Barman Primary Care Physician: [facility], Dossie Der Other Clinician: Referring Physician: [facility], Dossie Der Treating Physician/Extender: Frann Rider in Treatment: 11 Subjective Chief Complaint Information obtained from Patient Patient seen for complaints of Non-Healing Wound which she's had on the right lower extremity for about 5 weeks History of Present Illness (HPI) The following HPI elements were documented for the patient's wound: Location: open wound right lower extremity lateral leg Quality: Patient reports experiencing a dull pain to affected area(s). Severity: Patient states wound are getting worse. Duration: Patient has had the wound for > 6 weeks prior to seeking treatment at the wound center Timing: Pain in wound is Intermittent (comes and goes Context: The wound occurred  when the patient had a injury with a blunt object around 08/07/2015 Modifying Factors: a large swelling with drainage Associated Signs and Symptoms: Patient reports having increase swelling. 75 year old gentleman with known history of diabetes mellitus without complication had a lacerated wound to his right lower extremity which was repaired about 4 weeks ago. Was seen by his PCP Dr. Donneta Romberg with a open wound on the right lateral leg with some discharge and erythema and he was placed on a blocks and local dressing was applied and referred to [place] for further care. Past medical history significant for diabetes mellitus type 2, coronary artery disease, hypertension, arthritis, depression, foot surgery o2, back surgery, CABG, TURP, right total knee replacement. He is a former smoker and quit in 1994. On questioning the patient he gives a history that he had a blunt injury to his right leg and this happened on April 15 of this year and about 2 weeks later he had blood draining from here and had to go to the ER and have sutures placed. However on looking at his medical records he was in the ER on 05/17/2015 with a blunt injury on his right lower extremity and at that stage x-ray showed no fracture and the right lower leg ultrasound showed no evidence of DVT. Diagnosis of a hematoma was made and he was given symptomatic treatment for this. The next time he was seen in the emergency room was on 08/11/2015 when the patient had a 10 cm laceration to the right lateral leg and this was sutured by the ER physician Dr. Dahlia Client. His blood alcohol level was 162 mg percent that day and the sutures were to be removed in 10 days' time. 09/23/2015 -- had his ultrasound of the left lower extremity for possible hematoma and the impression was there was no discrete drainable hematoma in the area of clinical concern on the left lower extremity. On reviewing his history he clearly states that he never had a large  lacerated wound due to the injury or fall on April 19. He says he did have a few drinks but has not drunk and the hematoma which he had for a DRAYDEN, CLECKLER. ([id number]) couple of months just burst open and caused the big bleed and extrusion of clots. Objective Constitutional Pulse regular. Respirations normal and unlabored. Afebrile. Vitals Time Taken: 8:50 AM, Height: 74 in, Weight: 265 lbs, BMI:  34, Temperature: 97.9 F, Pulse: 80 bpm, Respiratory Rate: 18 breaths/min, Blood Pressure: 109/70 mmHg. Eyes Nonicteric. Reactive to light. Ears, Nose, Mouth, and Throat Lips, teeth, and gums WNL.Marland Kitchen Moist mucosa without lesions. Neck supple and nontender. No palpable supraclavicular or cervical adenopathy. Normal sized without goiter. Respiratory WNL. No retractions.. Breath sounds WNL, No rubs, rales, rhonchi, or wheeze.. Cardiovascular Heart rhythm and rate regular, no murmur or gallop.. Pedal Pulses WNL. No clubbing, cyanosis or edema. Lymphatic No adneopathy. No adenopathy. No adenopathy. Musculoskeletal Adexa without tenderness or enlargement.. Digits and nails w/o clubbing, cyanosis, infection, petechiae, ischemia, or inflammatory conditions.Marland Kitchen Psychiatric Judgement and insight Intact.. No evidence of depression, anxiety, or agitation.. General Notes: there is excellent resolution of his wound dimensions and there is no cellulitis surrounding it. The lymphedema is also looking much better. No debridement was required today. Integumentary (Hair, Skin) No suspicious lesions. No crepitus or fluctuance. No peri-wound warmth or erythema. No masses.. Wound #1 status is Open. Original cause of wound was Trauma. The wound is located on the Right,Lateral Lower Leg. The wound measures 1.2cm length x 0.7cm width x 0.4cm depth; 0.66cm^2 area and JEANCARLO, TOURAY E. ([id number]) 0.264cm^3 volume. The wound is limited to skin breakdown. There is tunneling at 6:00 with a maximum distance of 0.5cm.  There is a medium amount of serosanguineous drainage noted. The wound margin is thickened. There is large (67-100%) red granulation within the wound bed. There is a small (1-33%) amount of necrotic tissue within the wound bed including Adherent Slough. The periwound skin appearance exhibited: Induration, Erythema. The periwound skin appearance did not exhibit: Callus, Crepitus, Excoriation, Fluctuance, Friable, Localized Edema, Rash, Scarring, Dry/Scaly, Maceration, Moist, Atrophie Blanche, Cyanosis, Ecchymosis, Hemosiderin Staining, Mottled, Pallor, Rubor. The surrounding wound skin color is noted with erythema. Periwound temperature was noted as No Abnormality. The periwound has tenderness on palpation. 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 Anesthetic: Wound #1 Right,Lateral Lower Leg: Topical Lidocaine 4% cream applied to wound bed prior to debridement Skin Barriers/Peri-Wound Care: Wound #1 Right,Lateral Lower Leg: Skin Prep Primary Wound Dressing: Wound #1 Right,Lateral Lower Leg: Prisma Ag Secondary Dressing: Wound #1 Right,Lateral Lower Leg: Non-adherent pad - telfa island dressing Dressing Change Frequency: Wound #1 Right,Lateral Lower Leg: Change dressing every day. Edema Control: Wound #1 Right,Lateral Lower Leg: ASHKAN, ANNUNZIATO ([id number]) Elevate legs to the level of the heart and pump ankles as often as possible Support Garment 20-30 mm/Hg pressure to: - wear compression stockings Additional Orders / Instructions: Wound #1 Right,Lateral Lower Leg: Increase protein intake. I have  recommended he wears compression stockings of the 30-40 mm Hg variety and I discussed wearing this and how he should do his dressing in great detail. We will dress the wound with Prisma Ag strips and continue local care. I'm pleased with the progress this week. Electronic Signature(s) Signed: 12/06/2015 9:44:45 AM By: Christin Fudge MD, FACS Entered By: Christin Fudge on 12/06/2015 09:44:45 Seth Mcintosh ([id number]) -------------------------------------------------------------------------------- SuperBill Details Patient Name: Seth Mcintosh Date of Service: 12/06/2015 Medical Record Number: [id number] Patient Account Number: [id number] Date of Birth/Sex: 03-Mar-1941 (75 y.o. Male) Treating RN: Cornell Barman Primary Care Physician:  [facility], [place] Other Clinician: Referring Physician: [facility], [place] Treating Physician/Extender: Frann Rider in Treatment: 11 Diagnosis Coding ICD-10 Codes Code Description E11.622 Type 2 diabetes mellitus with other skin ulcer L97.213 Non-pressure chronic ulcer of right calf with necrosis of muscle M70.861 Other soft tissue disorders related to use, overuse and pressure, right lower leg Facility Procedures CPT4 Code: [id number] Description: [id number] - WOUND CARE VISIT-LEV 2 EST PT Modifier: Quantity: 1 Physician Procedures CPT4: Description Modifier Quantity Code [id number] - WC PHYS LEVEL 3 - EST PT 1 ICD-10 Description Diagnosis E11.622 Type 2 diabetes mellitus with other skin ulcer L97.213 Non-pressure chronic ulcer of right calf with necrosis of muscle M70.861 Other  soft tissue disorders related to use, overuse and pressure, right lower leg Electronic Signature(s) Signed: 12/06/2015 9:44:59 AM By: Christin Fudge MD, FACS Entered By: Christin Fudge on 12/06/2015 09:44:58

## 2015-12-13 ENCOUNTER — Encounter: Payer: Medicare Other | Admitting: Surgery

## 2015-12-13 ENCOUNTER — Encounter: Payer: Self-pay | Admitting: *Deleted

## 2015-12-13 DIAGNOSIS — S81801A Unspecified open wound, right lower leg, initial encounter: Secondary | ICD-10-CM | POA: Diagnosis not present

## 2015-12-13 DIAGNOSIS — M70861 Other soft tissue disorders related to use, overuse and pressure, right lower leg: Secondary | ICD-10-CM | POA: Diagnosis not present

## 2015-12-13 DIAGNOSIS — I2510 Atherosclerotic heart disease of native coronary artery without angina pectoris: Secondary | ICD-10-CM | POA: Diagnosis not present

## 2015-12-13 DIAGNOSIS — E11622 Type 2 diabetes mellitus with other skin ulcer: Secondary | ICD-10-CM | POA: Diagnosis not present

## 2015-12-13 DIAGNOSIS — I10 Essential (primary) hypertension: Secondary | ICD-10-CM | POA: Diagnosis not present

## 2015-12-13 DIAGNOSIS — L97213 Non-pressure chronic ulcer of right calf with necrosis of muscle: Secondary | ICD-10-CM | POA: Diagnosis not present

## 2015-12-13 DIAGNOSIS — Z87891 Personal history of nicotine dependence: Secondary | ICD-10-CM | POA: Diagnosis not present

## 2015-12-13 DIAGNOSIS — I890 Lymphedema, not elsewhere classified: Secondary | ICD-10-CM | POA: Diagnosis not present

## 2015-12-13 NOTE — Progress Notes (Signed)
TAAVON, TRUONG ([id number]) Visit Report for 12/13/2015 Arrival Information Details Patient Name: Seth Mcintosh, Seth Mcintosh Date of Service: 12/13/2015 8:45 AM Medical Record Number: [id number] Patient Account Number: [id number] Date of Birth/Sex: 10/22/1940 (75 y.o. Male) Treating RN: Ahmed Prima Primary Care Physician: [facility], Dossie Der Other Clinician: Referring Physician: [facility], Dossie Der Treating Physician/Extender: Frann Rider in Treatment: 12 Visit Information History Since Last Visit All ordered tests and consults were completed: No Patient Arrived: Ambulatory Added or deleted any medications: No Arrival Time: 08:59 Any new allergies or adverse reactions: No Accompanied By: self Had a fall or experienced change in No Transfer Assistance: None activities of daily living that may affect Patient Identification Verified: Yes risk of falls: Secondary Verification Process Yes Signs or symptoms of abuse/neglect since last No Completed: visito Patient Requires Transmission-Based No Hospitalized since last visit: No Precautions: Pain Present Now: No Patient Has Alerts: Yes Patient Alerts: DM II Electronic Signature(s) Signed: 12/13/2015 4:58:13 PM By: Alric Quan Entered By: Alric Quan on 12/13/2015 08:59:20 Seth Mcintosh ([id number]) -------------------------------------------------------------------------------- Clinic Level of Care Assessment Details Patient Name: Seth Mcintosh Date of Service: 12/13/2015 8:45 AM Medical Record Number: [id number] Patient Account Number: [id number] Date of Birth/Sex: 1940/06/15 (75 y.o. Male) Treating RN: Ahmed Prima Primary Care Physician: [facility], Dossie Der Other Clinician: Referring Physician: [facility], [facility] Treating Physician/Extender: Frann Rider in Treatment: 12 Clinic Level of Care Assessment Items TOOL 4 Quantity Score X - Use when only an EandM is performed on FOLLOW-UP visit 1 0 ASSESSMENTS - Nursing Assessment /  Reassessment X - Reassessment of Co-morbidities (includes updates in patient status) 1 10 X - Reassessment of Adherence to Treatment Plan 1 5 ASSESSMENTS - Wound and Skin Assessment / Reassessment X - Simple Wound Assessment / Reassessment - one wound 1 5 []  - Complex Wound Assessment / Reassessment - multiple wounds 0 []  - Dermatologic / Skin Assessment (not related to wound area) 0 ASSESSMENTS - Focused Assessment []  - Circumferential Edema Measurements - multi extremities 0 []  - Nutritional Assessment / Counseling / Intervention 0 []  - Lower Extremity Assessment (monofilament, tuning fork, pulses) 0 []  - Peripheral Arterial Disease Assessment (using hand held doppler) 0 ASSESSMENTS - Ostomy and/or Continence Assessment and Care []  - Incontinence Assessment and Management 0 []  - Ostomy Care Assessment and Management (repouching, etc.) 0 PROCESS - Coordination of Care X - Simple Patient / Family Education for ongoing care 1 15 []  - Complex (extensive) Patient / Family Education for ongoing care 0 []  - Staff obtains Programmer, systems, Records, Test Results / Process Orders 0 []  - Staff telephones HHA, Nursing Homes / Clarify orders / etc 0 []  - Routine Transfer to another Facility (non-emergent condition) 0 PRAVIN, RUMMEL ([id number]) []  - Routine Hospital Admission (non-emergent condition) 0 []  - New Admissions / Biomedical engineer / Ordering NPWT, Apligraf, etc. 0 []  - Emergency Hospital Admission (emergent condition) 0 X - Simple Discharge Coordination 1 10 []  - Complex (extensive) Discharge Coordination 0 PROCESS - Special Needs []  - Pediatric / Minor Patient Management 0 []  - Isolation Patient Management 0 []  - Hearing / Language / Visual special needs 0 []  - Assessment of Community assistance (transportation, D/C planning, etc.) 0 []  - Additional assistance / Altered mentation 0 []  - Support Surface(s) Assessment (bed, cushion, seat, etc.) 0 INTERVENTIONS - Wound Cleansing /  Measurement X - Simple Wound Cleansing - one wound 1 5 []  - Complex Wound Cleansing - multiple wounds 0 X - Wound Imaging (photographs - any number of  wounds) 1 5 []  - Wound Tracing (instead of photographs) 0 X - Simple Wound Measurement - one wound 1 5 []  - Complex Wound Measurement - multiple wounds 0 INTERVENTIONS - Wound Dressings []  - Small Wound Dressing one or multiple wounds 0 X - Medium Wound Dressing one or multiple wounds 1 15 []  - Large Wound Dressing one or multiple wounds 0 X - Application of Medications - topical 1 5 []  - Application of Medications - injection 0 INTERVENTIONS - Miscellaneous []  - External ear exam 0 NEFI, MELROSE ([id number]) []  - Specimen Collection (cultures, biopsies, blood, body fluids, etc.) 0 []  - Specimen(s) / Culture(s) sent or taken to Lab for analysis 0 []  - Patient Transfer (multiple staff / Harrel Lemon Lift / Similar devices) 0 []  - Simple Staple / Suture removal (25 or less) 0 []  - Complex Staple / Suture removal (26 or more) 0 []  - Hypo / Hyperglycemic Management (close monitor of Blood Glucose) 0 []  - Ankle / Brachial Index (ABI) - do not check if billed separately 0 X - Vital Signs 1 5 Has the patient been seen at the hospital within the last three years: Yes Total Score: 85 Level Of Care: New/Established - Level 3 Electronic Signature(s) Signed: 12/13/2015 4:58:13 PM By: Alric Quan Entered By: Alric Quan on 12/13/2015 09:46:37 Seth Mcintosh ([id number]) -------------------------------------------------------------------------------- Encounter Discharge Information Details Patient Name: Seth Mcintosh Date of Service: 12/13/2015 8:45 AM Medical Record Number: [id number] Patient Account Number: [id number] Date of Birth/Sex: 06/12/1940 (75 y.o. Male) Treating RN: Ahmed Prima Primary Care Physician: [facility], Dossie Der Other Clinician: Referring Physician: [facility], [facility] Treating Physician/Extender: Frann Rider in  Treatment: 12 Encounter Discharge Information Items Discharge Pain Level: 0 Discharge Condition: Stable Ambulatory Status: Ambulatory Discharge Destination: Home Private Transportation: Auto Accompanied By: self Schedule Follow-up Appointment: Yes Medication Reconciliation completed and Yes provided to Patient/Care Evadene Wardrip: Clinical Summary of Care: Electronic Signature(s) Signed: 12/13/2015 4:58:13 PM By: Alric Quan Entered By: Alric Quan on 12/13/2015 09:14:54 Seth Mcintosh ([id number]) -------------------------------------------------------------------------------- Lower Extremity Assessment Details Patient Name: Seth Mcintosh Date of Service: 12/13/2015 8:45 AM Medical Record Number: [id number] Patient Account Number: [id number] Date of Birth/Sex: [birth year] (75 y.o. Male) Treating RN: Ahmed Prima Primary Care Physician: [facility], Dossie Der Other Clinician: Referring Physician: [facility], [facility] Treating Physician/Extender: Frann Rider in Treatment: 12 Vascular Assessment Pulses: Posterior Tibial Dorsalis Pedis Palpable: [Right:Yes] Extremity colors, hair growth, and conditions: Extremity Color: [Right:Normal] Temperature of Extremity: [Right:Warm] Capillary Refill: [Right:< 3 seconds] Electronic Signature(s) Signed: 12/13/2015 4:58:13 PM By: Alric Quan Entered By: Alric Quan on 12/13/2015 09:01:44 Seth Mcintosh ([id number]) -------------------------------------------------------------------------------- Multi Wound Chart Details Patient Name: Seth Mcintosh Date of Service: 12/13/2015 8:45 AM Medical Record Number: [id number] Patient Account Number: [id number] Date of Birth/Sex: 10/15/1940 (75 y.o. Male) Treating RN: Ahmed Prima Primary Care Physician: [facility], Dossie Der Other Clinician: Referring Physician: [facility], [facility] Treating Physician/Extender: Frann Rider in Treatment: 12 Vital Signs Height(in): 74 Pulse(bpm):  64 Weight(lbs): 265 Blood Pressure 109/60 (mmHg): Body Mass Index(BMI): 34 Temperature(F): 97.8 Respiratory Rate 18 (breaths/min): Photos: [1:No Photos] [N/A:N/A] Wound Location: [1:Right Lower Leg - Lateral] [N/A:N/A] Wounding Event: [1:Trauma] [N/A:N/A] Primary Etiology: [1:Trauma, Other] [N/A:N/A] Comorbid History: [1:Glaucoma, Sleep Apnea, Coronary Artery Disease, Hypertension, Type II Diabetes, Osteoarthritis] [N/A:N/A] Date Acquired: [1:08/07/2015] [N/A:N/A] Weeks of Treatment: [1:12] [N/A:N/A] Wound Status: [1:Open] [N/A:N/A] Measurements L x W x D 1.2x0.5x0.3 [N/A:N/A] (cm) Area (cm) : [1:0.471] [N/A:N/A] Volume (cm) : [1:0.141] [N/A:N/A] % Reduction in Area: [1:88.00%] [N/A:N/A] % Reduction in  Volume: 94.90% [N/A:N/A] Starting Position 1 3 (o'clock): Ending Position 1 [1:6] (o'clock): Maximum Distance 1 2.2 (cm): Undermining: [1:Yes] [N/A:N/A] Classification: [1:Full Thickness With Exposed Support Structures] [N/A:N/A] HBO Classification: [1:Grade 1] [N/A:N/A] Exudate Amount: [1:Large] [N/A:N/A] Exudate Type: [1:Serosanguineous] [N/A:N/A] Exudate Color: red, brown N/A N/A Wound Margin: Thickened N/A N/A Granulation Amount: Large (67-100%) N/A N/A Granulation Quality: Red N/A N/A Necrotic Amount: Small (1-33%) N/A N/A Exposed Structures: Fascia: No N/A N/A Fat: No Tendon: No Muscle: No Joint: No Bone: No Limited to Skin Breakdown Epithelialization: None N/A N/A Periwound Skin Texture: Induration: Yes N/A N/A Edema: No Excoriation: No Callus: No Crepitus: No Fluctuance: No Friable: No Rash: No Scarring: No Periwound Skin Maceration: No N/A N/A Moisture: Moist: No Dry/Scaly: No Periwound Skin Color: Erythema: Yes N/A N/A Atrophie Blanche: No Cyanosis: No Ecchymosis: No Hemosiderin Staining: No Mottled: No Pallor: No Rubor: No Temperature: No Abnormality N/A N/A Tenderness on Yes N/A N/A Palpation: Wound Preparation: Ulcer Cleansing:  N/A N/A Rinsed/Irrigated with Saline Topical Anesthetic Applied: Other: lidocaine 4% Treatment Notes Electronic Signature(s) Signed: 12/13/2015 4:58:13 PM By: Charlton Haws ([id number]) Entered By: Alric Quan on 12/13/2015 09:07:16 Seth Mcintosh ([id number]) -------------------------------------------------------------------------------- Multi-Disciplinary Care Plan Details Patient Name: Seth Mcintosh Date of Service: 12/13/2015 8:45 AM Medical Record Number: [id number] Patient Account Number: [id number] Date of Birth/Sex: [birth year] (75 y.o. Male) Treating RN: Ahmed Prima Primary Care Physician: [facility], Dossie Der Other Clinician: Referring Physician: [facility], Dossie Der Treating Physician/Extender: Frann Rider in Treatment: 12 Active Inactive Abuse / Safety / Falls / Self Care Management Nursing Diagnoses: Potential for falls Goals: Patient will remain injury free Date Initiated: 09/16/2015 Goal Status: Active Interventions: Assess fall risk on admission and as needed Notes: Orientation to the Wound Care Program Nursing Diagnoses: Knowledge deficit related to the wound healing center program Goals: Patient/caregiver will verbalize understanding of the [place] Program Date Initiated: 09/16/2015 Goal Status: Active Interventions: Provide education on orientation to the wound center Notes: Pain, Acute or Chronic Nursing Diagnoses: Pain, acute or chronic: actual or potential Potential alteration in comfort, pain Goals: BARI, DOORLEY ([id number]) Patient will verbalize adequate pain control and receive pain control interventions during procedures as needed Date Initiated: 09/16/2015 Goal Status: Active Interventions: Assess comfort goal upon admission Complete pain assessment as per visit requirements Notes: Soft Tissue Infection Nursing Diagnoses: Impaired tissue integrity Goals: Patient will remain free of wound  infection Date Initiated: 09/16/2015 Goal Status: Active Interventions: Assess signs and symptoms of infection every visit Notes: Wound/Skin Impairment Nursing Diagnoses: Impaired tissue integrity Goals: Ulcer/skin breakdown will have a volume reduction of 30% by week 4 Date Initiated: 09/16/2015 Goal Status: Active Ulcer/skin breakdown will have a volume reduction of 50% by week 8 Date Initiated: 09/16/2015 Goal Status: Active Ulcer/skin breakdown will have a volume reduction of 80% by week 12 Date Initiated: 09/16/2015 Goal Status: Active Interventions: Assess patient/caregiver ability to obtain necessary supplies Assess ulceration(s) every visit Notes: VISHWA, ROYALL ([id number]) Electronic Signature(s) Signed: 12/13/2015 4:58:13 PM By: Alric Quan Entered By: Alric Quan on 12/13/2015 09:06:45 Seth Mcintosh ([id number]) -------------------------------------------------------------------------------- Pain Assessment Details Patient Name: Seth Mcintosh Date of Service: 12/13/2015 8:45 AM Medical Record Number: [id number] Patient Account Number: [id number] Date of Birth/Sex: 09-01-1940 (75 y.o. Male) Treating RN: Ahmed Prima Primary Care Physician: [facility], Dossie Der Other Clinician: Referring Physician: Keith Rake Treating Physician/Extender: Frann Rider in Treatment: 12 Active Problems Location of Pain Severity and Description of Pain Patient Has Paino No Site Locations With  Dressing Change: No Pain Management and Medication Current Pain Management: Electronic Signature(s) Signed: 12/13/2015 4:58:13 PM By: Alric Quan Entered By: Alric Quan on 12/13/2015 08:59:27 Seth Mcintosh ([id number]) -------------------------------------------------------------------------------- Patient/Caregiver Education Details Patient Name: Seth Mcintosh Date of Service: 12/13/2015 8:45 AM Medical Record Number: [id number] Patient Account Number:  [id number] Date of Birth/Gender: 1940-06-29 (75 y.o. Male) Treating RN: Ahmed Prima Primary Care Physician: [facility], Dossie Der Other Clinician: Referring Physician: [facility], Dossie Der Treating Physician/Extender: Frann Rider in Treatment: 12 Education Assessment Education Provided To: Patient Education Topics Provided Wound/Skin Impairment: Handouts: Other: change dressing as ordered Methods: Demonstration, Explain/Verbal Responses: State content correctly Electronic Signature(s) Signed: 12/13/2015 4:58:13 PM By: Alric Quan Entered By: Alric Quan on 12/13/2015 09:15:13 Seth Mcintosh ([id number]) -------------------------------------------------------------------------------- Wound Assessment Details Patient Name: Seth Mcintosh Date of Service: 12/13/2015 8:45 AM Medical Record Number: [id number] Patient Account Number: [id number] Date of Birth/Sex: 12-11-1940 (75 y.o. Male) Treating RN: Ahmed Prima Primary Care Physician: [facility], Dossie Der Other Clinician: Referring Physician: [facility], Dossie Der Treating Physician/Extender: Frann Rider in Treatment: 12 Wound Status Wound Number: 1 Primary Trauma, Other Etiology: Wound Location: Right Lower Leg - Lateral Wound Open Wounding Event: Trauma Status: Date Acquired: 08/07/2015 Comorbid Glaucoma, Sleep Apnea, Coronary Weeks Of Treatment: 12 History: Artery Disease, Hypertension, Type II Clustered Wound: No Diabetes, Osteoarthritis Photos Photo Uploaded By: Alric Quan on 12/13/2015 15:51:09 Wound Measurements Length: (cm) 1.2 % Reduction in Width: (cm) 0.5 % Reduction in Depth: (cm) 0.3 Epithelializat Area: (cm) 0.471 Tunneling: Volume: (cm) 0.141 Undermining: Starting Po Ending Posi Maximum Dis Area: 88% Volume: 94.9% ion: None No Yes sition (o'clock): 3 tion (o'clock): 6 tance: (cm) 2.2 Wound Description Full Thickness With Exposed Classification: Support Structures Diabetic  Severity Grade 1 (Wagner): Wound Margin: Thickened Exudate Amount: Large Exudate Type: Serosanguineous DARAY, CRUMPLEY ([id number]) Foul Odor After Cleansing: No Exudate Color: red, brown Wound Bed Granulation Amount: Large (67-100%) Exposed Structure Granulation Quality: Red Fascia Exposed: No Necrotic Amount: Small (1-33%) Fat Layer Exposed: No Necrotic Quality: Adherent Slough Tendon Exposed: No Muscle Exposed: No Joint Exposed: No Bone Exposed: No Limited to Skin Breakdown Periwound Skin Texture Texture Color No Abnormalities Noted: No No Abnormalities Noted: No Callus: No Atrophie Blanche: No Crepitus: No Cyanosis: No Excoriation: No Ecchymosis: No Fluctuance: No Erythema: Yes Friable: No Hemosiderin Staining: No Induration: Yes Mottled: No Localized Edema: No Pallor: No Rash: No Rubor: No Scarring: No Temperature / Pain Moisture Temperature: No Abnormality No Abnormalities Noted: No Tenderness on Palpation: Yes Dry / Scaly: No Maceration: No Moist: No Wound Preparation Ulcer Cleansing: Rinsed/Irrigated with Saline Topical Anesthetic Applied: Other: lidocaine 4%, Treatment Notes Wound #1 (Right, Lateral Lower Leg) 1. Cleansed with: Clean wound with Normal Saline 2. Anesthetic Topical Lidocaine 4% cream to wound bed prior to debridement 3. Peri-wound Care: Skin Prep 4. Dressing Applied: Iodosorb Ointment Plain packing gauze 5. Secondary Dressing Applied REGGY, DRYER ([id number]) [place] Signature(s) Signed: 12/13/2015 4:58:13 PM By: Alric Quan Entered By: Alric Quan on 12/13/2015 [place] Seth Mcintosh ([id number]) -------------------------------------------------------------------------------- [place] Details Patient Name: Seth Mcintosh Date of Service: 12/13/2015 8:45 AM Medical Record Number: [id number] Patient Account Number: [id number] Date of Birth/Sex: 08-26-1940 (75 y.o. Male) Treating RN:  Carolyne Fiscal, Debi Primary Care Physician: [facility], [facility] Other Clinician: Referring Physician: [facility], [facility] Treating Physician/Extender: Frann Rider in Treatment: 12 Vital Signs Time Taken: 08:59 Temperature (F): 97.8 Height (in): 74 Pulse (bpm): 64 Weight (lbs): 265 Respiratory Rate (breaths/min): 18 Body Mass Index (BMI): 34 Blood  Pressure (mmHg): 109/60 Reference Range: 80 - 120 mg / dl Electronic Signature(s) Signed: 12/13/2015 4:58:13 PM By: Alric Quan Entered By: Alric Quan on 12/13/2015 09:01:13

## 2015-12-13 NOTE — Progress Notes (Signed)
Seth Mcintosh, Seth Mcintosh ([id number]) Visit Report for 12/13/2015 Chief Complaint Document Details Patient Name: Seth Mcintosh, Seth Mcintosh Date of Service: 12/13/2015 8:45 AM Medical Record Number: [id number] Patient Account Number: [id number] Date of Birth/Sex: October 30, 1940 (75 y.o. Male) Treating RN: Ahmed Prima Primary Care Physician: [facility], Dossie Der Other Clinician: Referring Physician: Keith Rake Treating Physician/Extender: Frann Rider in Treatment: 12 Information Obtained from: Patient Chief Complaint Patient seen for complaints of Non-Healing Wound which she's had on the right lower extremity for about 5 weeks Electronic Signature(s) Signed: 12/13/2015 9:18:13 AM By: Christin Fudge MD, FACS Entered By: Christin Fudge on 12/13/2015 09:18:13 Seth Mcintosh ([id number]) -------------------------------------------------------------------------------- HPI Details Patient Name: Seth Mcintosh Date of Service: 12/13/2015 8:45 AM Medical Record Number: [id number] Patient Account Number: [id number] Date of Birth/Sex: [birth year] (75 y.o. Male) Treating RN: Ahmed Prima Primary Care Physician: [facility], Dossie Der Other Clinician: Referring Physician: [facility], Dossie Der Treating Physician/Extender: Frann Rider in Treatment: 12 History of Present Illness Location: open wound right lower extremity lateral leg Quality: Patient reports experiencing a dull pain to affected area(s). Severity: Patient states wound are getting worse. Duration: Patient has had the wound for > 6 weeks prior to seeking treatment at the wound center Timing: Pain in wound is Intermittent (comes and goes Context: The wound occurred when the patient had a injury with a blunt object around 08/07/2015 Modifying Factors: a large swelling with drainage Associated Signs and Symptoms: Patient reports having increase swelling. HPI Description: 75 year old gentleman with known history of diabetes mellitus without complication had  a lacerated wound to his right lower extremity which was repaired about 4 weeks ago. Was seen by his PCP Dr. Donneta Romberg with a open wound on the right lateral leg with some discharge and erythema and he was placed on a blocks and local dressing was applied and referred to [place] for further care. Past medical history significant for diabetes mellitus type 2, coronary artery disease, hypertension, arthritis, depression, foot surgery o2, back surgery, CABG, TURP, right total knee replacement. He is a former smoker and quit in 1994. On questioning the patient he gives a history that he had a blunt injury to his right leg and this happened on April 15 of this year and about 2 weeks later he had blood draining from here and had to go to the ER and have sutures placed. However on looking at his medical records he was in the ER on 05/17/2015 with a blunt injury on his right lower extremity and at that stage x-ray showed no fracture and the right lower leg ultrasound showed no evidence of DVT. Diagnosis of a hematoma was made and he was given symptomatic treatment for this. The next time he was seen in the emergency room was on 08/11/2015 when the patient had a 10 cm laceration to the right lateral leg and this was sutured by the ER physician Dr. Dahlia Client. His blood alcohol level was 162 mg percent that day and the sutures were to be removed in 10 days' time. 09/23/2015 -- had his ultrasound of the left lower extremity for possible hematoma and the impression was there was no discrete drainable hematoma in the area of clinical concern on the left lower extremity. On reviewing his history he clearly states that he never had a large lacerated wound due to the injury or fall on April 19. He says he did have a few drinks but has not drunk and the hematoma which he had for a couple of months just burst open and caused  the big bleed and extrusion of clots. Electronic Signature(s) Signed: 12/13/2015 9:18:18 AM By:  Christin Fudge MD, FACS Entered By: Christin Fudge on 12/13/2015 09:18:18 Seth Mcintosh ([id number]) -------------------------------------------------------------------------------- Physical Exam Details Patient Name: Seth Mcintosh Date of Service: 12/13/2015 8:45 AM Medical Record Number: [id number] Patient Account Number: [id number] Date of Birth/Sex: May 20, 1940 (75 y.o. Male) Treating RN: Ahmed Prima Primary Care Physician: [facility], Dossie Der Other Clinician: Referring Physician: [facility], [facility] Treating Physician/Extender: Frann Rider in Treatment: 12 Constitutional . Pulse regular. Respirations normal and unlabored. Afebrile. . Eyes Nonicteric. Reactive to light. Ears, Nose, Mouth, and Throat Lips, teeth, and gums WNL.Marland Kitchen Moist mucosa without lesions. Neck supple and nontender. No palpable supraclavicular or cervical adenopathy. Normal sized without goiter. Respiratory WNL. No retractions.. Breath sounds WNL, No rubs, rales, rhonchi, or wheeze.. Cardiovascular Heart rhythm and rate regular, no murmur or gallop.. Pedal Pulses WNL. No clubbing, cyanosis or edema. Lymphatic No adneopathy. No adenopathy. No adenopathy. Musculoskeletal Adexa without tenderness or enlargement.. Digits and nails w/o clubbing, cyanosis, infection, petechiae, ischemia, or inflammatory conditions.. Integumentary (Hair, Skin) No suspicious lesions. No crepitus or fluctuance. No peri-wound warmth or erythema. No masses.Marland Kitchen Psychiatric Judgement and insight Intact.. No evidence of depression, anxiety, or agitation.. Notes I have requested out well with saline and a Q-tip and gauze. No evidence of any inflammation and the lymphedema is much better. Electronic Signature(s) Signed: 12/13/2015 9:18:52 AM By: Christin Fudge MD, FACS Entered By: Christin Fudge on 12/13/2015 09:18:52 Seth Mcintosh ([id number]) -------------------------------------------------------------------------------- Physician Orders  Details Patient Name: Seth Mcintosh Date of Service: 12/13/2015 8:45 AM Medical Record Number: [id number] Patient Account Number: [id number] Date of Birth/Sex: 05-Jun-1940 (75 y.o. Male) Treating RN: Ahmed Prima Primary Care Physician: [facility], Dossie Der Other Clinician: Referring Physician: [facility], Dossie Der Treating Physician/Extender: Frann Rider in Treatment: 65 Verbal / Phone Orders: Yes ClinicianCarolyne Fiscal, Debi Read Back and Verified: Yes Diagnosis Coding Wound Cleansing Wound #1 Right,Lateral Lower Leg o Cleanse wound with mild soap and water o May Shower, gently pat wound dry prior to applying new dressing. Anesthetic Wound #1 Right,Lateral Lower Leg o Topical Lidocaine 4% cream applied to wound bed prior to debridement Skin Barriers/Peri-Wound Care Wound #1 Right,Lateral Lower Leg o Skin Prep Primary Wound Dressing Wound #1 Right,Lateral Lower Leg o Iodosorb Ointment o Plain packing gauze Secondary Dressing Wound #1 Right,Lateral Lower Leg o Non-adherent pad - telfa island dressing Dressing Change Frequency Wound #1 Right,Lateral Lower Leg o Change dressing every day. Edema Control Wound #1 Right,Lateral Lower Leg o Elevate legs to the level of the heart and pump ankles as often as possible o Support Garment 20-30 mm/Hg pressure to: - wear compression stockings Additional Orders / Instructions Wound #1 Right,Lateral Lower Leg o Increase protein intake. JAEDIN, VANDERSCHAAF ([id number]) Electronic Signature(s) Signed: 12/13/2015 4:08:58 PM By: Christin Fudge MD, FACS Signed: 12/13/2015 4:58:13 PM By: Alric Quan Entered By: Alric Quan on 12/13/2015 09:14:13 Seth Mcintosh ([id number]) -------------------------------------------------------------------------------- Problem List Details Patient Name: Seth Mcintosh Date of Service: 12/13/2015 8:45 AM Medical Record Number: [id number] Patient Account Number: [id number] Date of  Birth/Sex: 1940-04-26 (75 y.o. 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 Male) Treating RN: Ahmed Prima Primary Care Physician: [facility], Dossie Der Other Clinician: Referring Physician: Keith Rake Treating Physician/Extender: Frann Rider in Treatment: 12 Subjective Chief Complaint Information obtained from Patient Patient seen for complaints of Non-Healing Wound which she's had on the right lower extremity for about 5 weeks History of Present Illness (HPI) The following HPI elements were documented for the patient's wound: Location: open wound right lower extremity lateral leg Quality: Patient reports experiencing a dull pain to affected area(s). Severity: Patient states wound are getting worse. Duration: Patient has had the wound for > 6 weeks prior to seeking treatment at the wound center Timing: Pain in wound is Intermittent (comes and goes Context: The wound occurred when the patient had a injury with a blunt object around  08/07/2015 Modifying Factors: a large swelling with drainage Associated Signs and Symptoms: Patient reports having increase swelling. 75 year old gentleman with known history of diabetes mellitus without complication had a lacerated wound to his right lower extremity which was repaired about 4 weeks ago. Was seen by his PCP Dr. Donneta Romberg with a open wound on the right lateral leg with some discharge and erythema and he was placed on a blocks and local dressing was applied and referred to [place] for further care. Past medical history significant for diabetes mellitus type 2, coronary artery disease, hypertension, arthritis, depression, foot surgery o2, back surgery, CABG, TURP, right total knee replacement. He is a former smoker and quit in 1994. On questioning the patient he gives a history that he had a blunt injury to his right leg and this happened on April 15 of this year and about 2 weeks later he had blood draining from here and had to go to the ER and have sutures placed. However on looking at his medical records he was in the ER on 05/17/2015 with a blunt injury on his right lower extremity and at that stage x-ray showed no fracture and the right lower leg ultrasound showed no evidence of DVT. Diagnosis of a hematoma was made and he was given symptomatic treatment for this. The next time he was seen in the emergency room was on 08/11/2015 when the patient had a 10 cm laceration to the right lateral leg and this was sutured by the ER physician Dr. Dahlia Client. His blood alcohol level was 162 mg percent that day and the sutures were to be removed in 10 days' time. 09/23/2015 -- had his ultrasound of the left lower extremity for possible hematoma and the impression was there was no discrete drainable hematoma in the area of clinical concern on the left lower extremity. On reviewing his history he clearly states that he never had a large lacerated wound due to the injury or fall on April 19. He says  he did have a few drinks but has not drunk and the hematoma which he had for a Seth Mcintosh, PALLONE. (Seth Mcintosh) couple of months just burst open and caused the big bleed and extrusion of clots. Objective Constitutional Pulse regular. Respirations normal and unlabored. Afebrile. Vitals Time Taken: 8:59 AM, Height: 74 in, Weight: 265 lbs, BMI: 34, Temperature: 97.8 F, Pulse: 64  bpm, Respiratory Rate: 18 breaths/min, Blood Pressure: 109/60 mmHg. Eyes Nonicteric. Reactive to light. Ears, Nose, Mouth, and Throat Lips, teeth, and gums WNL.Marland Kitchen Moist mucosa without lesions. Neck supple and nontender. No palpable supraclavicular or cervical adenopathy. Normal sized without goiter. Respiratory WNL. No retractions.. Breath sounds WNL, No rubs, rales, rhonchi, or wheeze.. Cardiovascular Heart rhythm and rate regular, no murmur or gallop.. Pedal Pulses WNL. No clubbing, cyanosis or edema. Lymphatic No adneopathy. No adenopathy. No adenopathy. Musculoskeletal Adexa without tenderness or enlargement.. Digits and nails w/o clubbing, cyanosis, infection, petechiae, ischemia, or inflammatory conditions.Marland Kitchen Psychiatric Judgement and insight Intact.. No evidence of depression, anxiety, or agitation.. General Notes: I have requested out well with saline and a Q-tip and gauze. No evidence of any inflammation and the lymphedema is much better. Integumentary (Hair, Skin) No suspicious lesions. No crepitus or fluctuance. No peri-wound warmth or erythema. No masses.. Wound #1 status is Open. Original cause of wound was Trauma. The wound is located on the Right,Lateral Lower Leg. The wound measures 1.2cm length x 0.5cm width x 0.3cm depth; 0.471cm^2 area and Seth Mcintosh, Seth Mcintosh. (Seth Mcintosh) 0.141cm^3 volume. The wound is limited to skin breakdown. There is no tunneling noted, however, there is undermining starting at 3:00 and ending at 6:00 with a maximum distance of 2.2cm. There is a large amount of serosanguineous  drainage noted. The wound margin is thickened. There is large (67-100%) red granulation within the wound bed. There is a small (1-33%) amount of necrotic tissue within the wound bed including Adherent Slough. The periwound skin appearance exhibited: Induration, Erythema. The periwound skin appearance did not exhibit: Callus, Crepitus, Excoriation, Fluctuance, Friable, Localized Edema, Rash, Scarring, Dry/Scaly, Maceration, Moist, Atrophie Blanche, Cyanosis, Ecchymosis, Hemosiderin Staining, Mottled, Pallor, Rubor. The surrounding wound skin color is noted with erythema. Periwound temperature was noted as No Abnormality. The periwound has tenderness on palpation. 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 Elevation, exercise and using his compression stockings have been discussed with him. He will see me back next week Plan Wound Cleansing: Wound #1 Right,Lateral Lower Leg: Cleanse wound with mild soap and water May Shower, gently pat wound dry prior to applying new dressing. Anesthetic: Wound #1 Right,Lateral Lower Leg: Topical Lidocaine 4% cream applied to wound bed prior to debridement Skin Barriers/Peri-Wound Care: Wound #1 Right,Lateral Lower Leg: Skin Prep Primary Wound Dressing: Wound #1 Right,Lateral Lower Leg: Iodosorb Ointment Plain packing gauze Seth Mcintosh, Seth Mcintosh (Seth Mcintosh) Secondary Dressing: Wound #1 Right,Lateral Lower Leg: Non-adherent pad - telfa island dressing Dressing Change Frequency: Wound #1 Right,Lateral Lower Leg: Change dressing every day. Edema Control: Wound #1 Right,Lateral Lower Leg: Elevate legs to the level of the heart and pump ankles as often as  possible Support Garment 20-30 mm/Hg pressure to: - wear compression stockings Additional Orders / Instructions: Wound #1 Right,Lateral Lower Leg: Increase protein intake. I have recommended we change her over to iodosorb and a packing strip can do this daily. Elevation, exercise and using his compression stockings have been discussed with him. He will see me back next week Electronic Signature(s) Signed: 12/13/2015 9:20:27 AM By: Christin Fudge MD, FACS Entered By: Christin Fudge on 12/13/2015 09:20:27 Seth Mcintosh (Seth Mcintosh) -------------------------------------------------------------------------------- SuperBill Details Patient Name: Seth Mcintosh,  Seth Mcintosh Date of Service: 12/13/2015 Medical Record Number: Seth Mcintosh Patient Account Number: [id number] Date of Birth/Sex: 07/05/1940 (75 y.o. Male) Treating RN: Ahmed Prima Primary Care Physician: [facility], Dossie Der Other Clinician: Referring Physician: [facility], Dossie Der Treating Physician/Extender: Frann Rider in Treatment: 12 Diagnosis Coding ICD-10 Codes Code Description E11.622 Type 2 diabetes mellitus with other skin ulcer L97.213 Non-pressure chronic ulcer of right calf with necrosis of muscle M70.861 Other soft tissue disorders related to use, overuse and pressure, right lower leg Facility Procedures CPT4 Code: [id number] Description: 99213 - WOUND CARE VISIT-LEV 3 EST PT Modifier: Quantity: 1 Physician Procedures CPT4: Description Modifier Quantity Code [id number] - WC PHYS LEVEL 3 - EST PT 1 ICD-10 Description Diagnosis E11.622 Type 2 diabetes mellitus with other skin ulcer L97.213 Non-pressure chronic ulcer of right calf with necrosis of muscle M70.861 Other  soft tissue disorders related to use, overuse and pressure, right lower leg Electronic Signature(s) Signed: 12/13/2015 4:08:58 PM By: Christin Fudge MD, FACS Signed: 12/13/2015 4:58:13 PM By: Alric Quan Previous Signature: 12/13/2015 9:20:40 AM Version By: Christin Fudge MD, FACS Entered By: Alric Quan on 12/13/2015 09:46:47

## 2015-12-14 ENCOUNTER — Telehealth: Payer: Self-pay

## 2015-12-14 MED ORDER — IRBESARTAN 150 MG PO TABS: 150.0000 mg | ORAL_TABLET | Freq: Every day | ORAL | 0 refills | Status: DC

## 2015-12-14 NOTE — Telephone Encounter (Signed)
A prescription for Avapro 150 mg take daily has been sent to [facility] per Dr. Manuella Ghazi, pt has been notified and verbalized understanding

## 2015-12-16 ENCOUNTER — Ambulatory Visit: Payer: Medicare Other | Admitting: Anesthesiology

## 2015-12-16 ENCOUNTER — Ambulatory Visit
Admission: RE | Admit: 2015-12-16 | Discharge: 2015-12-16 | Disposition: A | Discharge status: Home / Self Care | Payer: Medicare Other | Source: Ambulatory Visit | Attending: Ophthalmology | Admitting: Ophthalmology

## 2015-12-16 ENCOUNTER — Encounter
Admission: RE | Disposition: A | Discharge status: Home / Self Care | Payer: Self-pay | Source: Ambulatory Visit | Attending: Ophthalmology

## 2015-12-16 ENCOUNTER — Encounter: Payer: Self-pay | Admitting: *Deleted

## 2015-12-16 DIAGNOSIS — Z87891 Personal history of nicotine dependence: Secondary | ICD-10-CM | POA: Insufficient documentation

## 2015-12-16 DIAGNOSIS — Z955 Presence of coronary angioplasty implant and graft: Secondary | ICD-10-CM | POA: Insufficient documentation

## 2015-12-16 DIAGNOSIS — D573 Sickle-cell trait: Secondary | ICD-10-CM | POA: Diagnosis not present

## 2015-12-16 DIAGNOSIS — H2513 Age-related nuclear cataract, bilateral: Secondary | ICD-10-CM | POA: Diagnosis not present

## 2015-12-16 DIAGNOSIS — Z7951 Long term (current) use of inhaled steroids: Secondary | ICD-10-CM | POA: Insufficient documentation

## 2015-12-16 DIAGNOSIS — G4730 Sleep apnea, unspecified: Secondary | ICD-10-CM | POA: Diagnosis not present

## 2015-12-16 DIAGNOSIS — M069 Rheumatoid arthritis, unspecified: Secondary | ICD-10-CM | POA: Diagnosis not present

## 2015-12-16 DIAGNOSIS — I10 Essential (primary) hypertension: Secondary | ICD-10-CM | POA: Insufficient documentation

## 2015-12-16 DIAGNOSIS — Z9889 Other specified postprocedural states: Secondary | ICD-10-CM | POA: Diagnosis not present

## 2015-12-16 DIAGNOSIS — I2510 Atherosclerotic heart disease of native coronary artery without angina pectoris: Secondary | ICD-10-CM | POA: Diagnosis not present

## 2015-12-16 DIAGNOSIS — Z7984 Long term (current) use of oral hypoglycemic drugs: Secondary | ICD-10-CM | POA: Insufficient documentation

## 2015-12-16 DIAGNOSIS — Z7982 Long term (current) use of aspirin: Secondary | ICD-10-CM | POA: Diagnosis not present

## 2015-12-16 DIAGNOSIS — H2511 Age-related nuclear cataract, right eye: Secondary | ICD-10-CM | POA: Insufficient documentation

## 2015-12-16 DIAGNOSIS — K219 Gastro-esophageal reflux disease without esophagitis: Secondary | ICD-10-CM | POA: Insufficient documentation

## 2015-12-16 DIAGNOSIS — K5790 Diverticulosis of intestine, part unspecified, without perforation or abscess without bleeding: Secondary | ICD-10-CM | POA: Insufficient documentation

## 2015-12-16 DIAGNOSIS — E119 Type 2 diabetes mellitus without complications: Secondary | ICD-10-CM | POA: Diagnosis not present

## 2015-12-16 DIAGNOSIS — Z79899 Other long term (current) drug therapy: Secondary | ICD-10-CM | POA: Insufficient documentation

## 2015-12-16 DIAGNOSIS — M1711 Unilateral primary osteoarthritis, right knee: Secondary | ICD-10-CM | POA: Insufficient documentation

## 2015-12-16 DIAGNOSIS — F329 Major depressive disorder, single episode, unspecified: Secondary | ICD-10-CM | POA: Insufficient documentation

## 2015-12-16 DIAGNOSIS — Z96659 Presence of unspecified artificial knee joint: Secondary | ICD-10-CM | POA: Insufficient documentation

## 2015-12-16 DIAGNOSIS — I25810 Atherosclerosis of coronary artery bypass graft(s) without angina pectoris: Secondary | ICD-10-CM | POA: Diagnosis not present

## 2015-12-16 DIAGNOSIS — Z951 Presence of aortocoronary bypass graft: Secondary | ICD-10-CM | POA: Insufficient documentation

## 2015-12-16 HISTORY — DX: Sleep apnea, unspecified: G47.30

## 2015-12-16 HISTORY — DX: Disorder of the skin and subcutaneous tissue, unspecified: L98.9

## 2015-12-16 HISTORY — PX: CATARACT EXTRACTION W/PHACO: SHX586

## 2015-12-16 LAB — GLUCOSE, CAPILLARY: Glucose-Capillary: 159 mg/dL — ABNORMAL HIGH (ref 65–99)

## 2015-12-16 SURGERY — PHACOEMULSIFICATION, CATARACT, WITH IOL INSERTION
Anesthesia: Monitor Anesthesia Care | Site: Eye | Laterality: Right | Wound class: Clean

## 2015-12-16 MED ORDER — POVIDONE-IODINE 5 % OP SOLN
1.0000 "application " | Freq: Once | OPHTHALMIC | Status: AC
  Administered 2015-12-16: 1 via OPHTHALMIC

## 2015-12-16 MED ORDER — LIDOCAINE HCL (PF) 4 % IJ SOLN
INTRAMUSCULAR | Status: AC
  Filled 2015-12-16: qty 5

## 2015-12-16 MED ORDER — SODIUM HYALURONATE 10 MG/ML IO SOLN
INTRAOCULAR | Status: AC
  Filled 2015-12-16: qty 0.85

## 2015-12-16 MED ORDER — SODIUM HYALURONATE 23 MG/ML IO SOLN
INTRAOCULAR | Status: AC
  Filled 2015-12-16: qty 0.6

## 2015-12-16 MED ORDER — EPINEPHRINE HCL 1 MG/ML IJ SOLN
INTRAMUSCULAR | Status: AC
  Filled 2015-12-16: qty 2

## 2015-12-16 MED ORDER — MOXIFLOXACIN HCL 0.5 % OP SOLN
OPHTHALMIC | Status: DC | PRN
  Administered 2015-12-16: 1 [drp] via OPHTHALMIC

## 2015-12-16 MED ORDER — SODIUM HYALURONATE 10 MG/ML IO SOLN
INTRAOCULAR | Status: DC | PRN
  Administered 2015-12-16: .85 mL via INTRAOCULAR

## 2015-12-16 MED ORDER — MOXIFLOXACIN HCL 0.5 % OP SOLN: 1.0000 [drp] | OPHTHALMIC | Status: DC | PRN

## 2015-12-16 MED ORDER — ALFENTANIL 500 MCG/ML IJ INJ
INJECTION | INTRAMUSCULAR | Status: DC | PRN
  Administered 2015-12-16: 500 ug via INTRAVENOUS

## 2015-12-16 MED ORDER — SODIUM CHLORIDE 0.9 % IV SOLN
INTRAVENOUS | Status: DC
  Administered 2015-12-16: 07:00:00 via INTRAVENOUS

## 2015-12-16 MED ORDER — MIDAZOLAM HCL 5 MG/5ML IJ SOLN
INTRAMUSCULAR | Status: DC | PRN
  Administered 2015-12-16 (×2): 1 mg via INTRAVENOUS

## 2015-12-16 MED ORDER — ARMC OPHTHALMIC DILATING GEL
1.0000 "application " | OPHTHALMIC | Status: AC | PRN
  Administered 2015-12-16 (×2): 1 via OPHTHALMIC

## 2015-12-16 MED ORDER — EPINEPHRINE HCL 1 MG/ML IJ SOLN
INTRAMUSCULAR | Status: DC | PRN
  Administered 2015-12-16: 1 mL via OPHTHALMIC

## 2015-12-16 MED ORDER — BSS IO SOLN
INTRAOCULAR | Status: DC | PRN
  Administered 2015-12-16: .5 mL via OPHTHALMIC

## 2015-12-16 MED ORDER — SODIUM HYALURONATE 23 MG/ML IO SOLN
INTRAOCULAR | Status: DC | PRN
  Administered 2015-12-16: .5 mL via INTRAOCULAR

## 2015-12-16 MED ORDER — TETRACAINE HCL 0.5 % OP SOLN
1.0000 [drp] | Freq: Once | OPHTHALMIC | Status: AC
  Administered 2015-12-16: 1 [drp] via OPHTHALMIC

## 2015-12-16 SURGICAL SUPPLY — 22 items
CANNULA ANT/CHMB 27GA (MISCELLANEOUS) ×6 IMPLANT
CUP MEDICINE 2OZ PLAST GRAD ST (MISCELLANEOUS) ×3 IMPLANT
GLOVE BIO SURGEON STRL SZ8 (GLOVE) ×3 IMPLANT
GLOVE BIOGEL M 6.5 STRL (GLOVE) ×3 IMPLANT
GLOVE SURG LX 7.5 STRW (GLOVE) ×2
GLOVE SURG LX STRL 7.5 STRW (GLOVE) ×1 IMPLANT
GOWN STRL REUS W/ TWL LRG LVL3 (GOWN DISPOSABLE) ×2 IMPLANT
GOWN STRL REUS W/TWL LRG LVL3 (GOWN DISPOSABLE) ×4
LENS IOL ACRSF IQ PC 19.0 (Intraocular Lens) ×1 IMPLANT
LENS IOL ACRYSOF IQ POST 19.0 (Intraocular Lens) ×3 IMPLANT
PACK CATARACT (MISCELLANEOUS) ×3 IMPLANT
PACK CATARACT BRASINGTON LX (MISCELLANEOUS) ×3 IMPLANT
PACK EYE AFTER SURG (MISCELLANEOUS) ×3 IMPLANT
SOL BSS BAG (MISCELLANEOUS) ×3
SOL PREP PVP 2OZ (MISCELLANEOUS) ×3
SOLUTION BSS BAG (MISCELLANEOUS) ×1 IMPLANT
SOLUTION PREP PVP 2OZ (MISCELLANEOUS) ×1 IMPLANT
SYR 3ML LL SCALE MARK (SYRINGE) ×6 IMPLANT
SYR 5ML LL (SYRINGE) ×3 IMPLANT
SYR TB 1ML 27GX1/2 LL (SYRINGE) ×3 IMPLANT
WATER STERILE IRR 250ML POUR (IV SOLUTION) ×3 IMPLANT
WIPE NON LINTING 3.25X3.25 (MISCELLANEOUS) ×3 IMPLANT

## 2015-12-16 NOTE — Anesthesia Preprocedure Evaluation (Addendum)
Anesthesia Evaluation  Patient identified by MRN, date of birth, ID band Patient awake    Reviewed: Allergy & Precautions, H&P , NPO status , Patient's Chart, lab work & pertinent test results  History of Anesthesia Complications Negative for: history of anesthetic complications  Airway Mallampati: II  TM Distance: >3 FB Neck ROM: Full    Dental  (+) Dental Advisory Given, Missing   Pulmonary neg shortness of breath, sleep apnea and Continuous Positive Airway Pressure Ventilation , neg COPD, neg recent URI, former smoker,    Pulmonary exam normal breath sounds clear to auscultation       Cardiovascular Exercise Tolerance: Good hypertension, Pt. on medications (-) angina+ CAD and + CABG  (-) Past MI and (-) Cardiac Stents Normal cardiovascular exam(-) dysrhythmias (-) Valvular Problems/Murmurs Rhythm:Regular Rate:Normal     Neuro/Psych PSYCHIATRIC DISORDERS Depression negative neurological ROS     GI/Hepatic negative GI ROS, Neg liver ROS, GERD  Medicated,  Endo/Other  diabetes, Type 2, Oral Hypoglycemic Agents  Renal/GU negative Renal ROS     Musculoskeletal negative musculoskeletal ROS (+)   Abdominal (+) + obese,   Peds  Hematology negative hematology ROS (+) Sickle cell trait ,   Anesthesia Other Findings Past Medical History: No date: Arthritis     Comment: rheumatoid  No date: Coronary artery disease No date: Depression No date: Diabetes mellitus without complication (HCC) No date: GERD (gastroesophageal reflux disease) No date: Hypertension No date: Leg lesion     Comment: RIGHT WOUND CARE 05/21/2012: Localized osteoarthritis of right knee No date: Reflux No date: Sleep apnea     Comment: CPAP No date: Sleeping difficulty     Comment: takes med nightly No date: Urgency of urination   Reproductive/Obstetrics negative OB ROS                             Anesthesia  Physical  Anesthesia Plan  ASA: III  Anesthesia Plan: MAC   Post-op Pain Management:    Induction:   Airway Management Planned:   Additional Equipment:   Intra-op Plan:   Post-operative Plan:   Informed Consent: I have reviewed the patients History and Physical, chart, labs and discussed the procedure including the risks, benefits and alternatives for the proposed anesthesia with the patient or authorized representative who has indicated his/her understanding and acceptance.   Dental advisory given  Plan Discussed with: CRNA  Anesthesia Plan Comments:        Anesthesia Quick Evaluation

## 2015-12-16 NOTE — H&P (Signed)
  The History and Physical notes are on paper, have been signed, and are to be scanned. The patient remains stable and unchanged from the H&P.   Previous H&P reviewed, patient examined, and there are no changes.  Seth Mcintosh 12/16/2015 7:43 AM

## 2015-12-16 NOTE — Discharge Instructions (Signed)

## 2015-12-16 NOTE — Transfer of Care (Signed)
Immediate Anesthesia Transfer of Care Note  Patient: Seth MIKEL Sr.  Procedure(s) Performed: Procedure(s) with comments: CATARACT EXTRACTION PHACO AND INTRAOCULAR LENS PLACEMENT ([place]) (Right) - [place] 01:16 AP% 8.7 CDE 6.66 fluid pack lot # [id number]  Patient Location: PACU  Anesthesia Type:MAC  Level of Consciousness: awake, alert , oriented and patient cooperative  Airway & Oxygen Therapy: Patient Spontanous Breathing  Post-op Assessment: Report given to RN, Post -op Vital signs reviewed and stable and Patient moving all extremities X 4  Post vital signs: Reviewed and stable  Last Vitals:  Vitals:   12/16/15 0615  BP: 140/79  Pulse: 61  Resp: (!) 97  Temp: 36.6 C    Last Pain:  Vitals:   12/16/15 0615  TempSrc: Oral         Complications: No apparent anesthesia complications

## 2015-12-16 NOTE — Anesthesia Postprocedure Evaluation (Signed)
Anesthesia Post Note  Patient: Seth GUSTAVE Sr.  Procedure(s) Performed: Procedure(s) (LRB): CATARACT EXTRACTION PHACO AND INTRAOCULAR LENS PLACEMENT ([place]) (Right)  Patient location during evaluation: PACU Anesthesia Type: MAC Level of consciousness: awake and alert, oriented and patient cooperative Pain management: satisfactory to patient Vital Signs Assessment: post-procedure vital signs reviewed and stable Respiratory status: respiratory function stable Cardiovascular status: stable Anesthetic complications: no    Last Vitals:  Vitals:   12/16/15 0615 12/16/15 0823  BP: 140/79 (!) 141/82  Pulse: 61 67  Resp: (!) 97 18  Temp: 36.6 C 36.5 C    Last Pain:  Vitals:   12/16/15 0823  TempSrc: Shane Crutch A

## 2015-12-16 NOTE — Op Note (Signed)
OPERATIVE NOTE  Seth SCHUFF Sr. [id number] 12/16/2015   PREOPERATIVE DIAGNOSIS:  Nuclear sclerotic cataract right eye.  H25.11   POSTOPERATIVE DIAGNOSIS:    Nuclear sclerotic cataract right eye.     PROCEDURE:  Phacoemusification with posterior chamber intraocular lens placement of the right eye   LENS:   Implant Name Type Inv. Item Serial No. Manufacturer Lot No. LRB No. Used  IMPLANT LENS - [id number] Intraocular Lens IMPLANT LENS [id number] ALCON   Right 1       [id number] 19.0   ULTRASOUND TIME: 1 minutes 16 seconds.  CDE 6.66   SURGEON:  Benay Pillow, MD, MPH  ANESTHESIOLOGIST: Anesthesiologist: Martha Clan, MD CRNA: Silvana Newness, CRNA   ANESTHESIA:  Topical with tetracaine drops and 2% Xylocaine jelly, augmented with 1% preservative-free intracameral lidocaine.  ESTIMATED BLOOD LOSS: less than 1 mL.   COMPLICATIONS:  None.   DESCRIPTION OF PROCEDURE:  The patient was identified in the holding room and transported to the operating room and placed in the supine position under the operating microscope.  The right eye was identified as the operative eye and it was prepped and draped in the usual sterile ophthalmic fashion.   A 1.0 millimeter clear-corneal paracentesis was made at the 10:30 position. 0.5 ml of preservative-free 1% lidocaine with epinephrine was injected into the anterior chamber.  The anterior chamber was filled with Healon5 viscoelastic.  A 2.4 millimeter keratome was used to make a near-clear corneal incision at the 8:00 position.  A curvilinear capsulorrhexis was made with a cystotome and capsulorrhexis forceps.  Balanced salt solution was used to hydrodissect and hydrodelineate the nucleus.   Phacoemulsification was then used in stop and chop fashion to remove the lens nucleus and epinucleus.  The remaining cortex was then removed using the irrigation and aspiration handpiece. Healon was then placed into the capsular bag to distend it for lens placement.   A lens was then injected into the capsular bag.  The remaining viscoelastic was aspirated.   Wounds were hydrated with balanced salt solution.  The anterior chamber was inflated to a physiologic pressure with balanced salt solution.    Intracameral vigamox 0.1 mLundiluted was injected into the eye.  No wound leaks were noted.  Topical Vigamox drops were applied to the eye.  The patient was taken to the recovery room in stable condition without complications of anesthesia or surgery  Benay Pillow 12/16/2015, 8:22 AM

## 2015-12-20 ENCOUNTER — Encounter: Payer: Medicare Other | Admitting: Surgery

## 2015-12-20 DIAGNOSIS — I2510 Atherosclerotic heart disease of native coronary artery without angina pectoris: Secondary | ICD-10-CM | POA: Diagnosis not present

## 2015-12-20 DIAGNOSIS — E11622 Type 2 diabetes mellitus with other skin ulcer: Secondary | ICD-10-CM | POA: Diagnosis not present

## 2015-12-20 DIAGNOSIS — Z87891 Personal history of nicotine dependence: Secondary | ICD-10-CM | POA: Diagnosis not present

## 2015-12-20 DIAGNOSIS — M70861 Other soft tissue disorders related to use, overuse and pressure, right lower leg: Secondary | ICD-10-CM | POA: Diagnosis not present

## 2015-12-20 DIAGNOSIS — L97213 Non-pressure chronic ulcer of right calf with necrosis of muscle: Secondary | ICD-10-CM | POA: Diagnosis not present

## 2015-12-20 DIAGNOSIS — L97811 Non-pressure chronic ulcer of other part of right lower leg limited to breakdown of skin: Secondary | ICD-10-CM | POA: Diagnosis not present

## 2015-12-20 DIAGNOSIS — I10 Essential (primary) hypertension: Secondary | ICD-10-CM | POA: Diagnosis not present

## 2015-12-20 NOTE — Progress Notes (Signed)
Seth Mcintosh, Seth Mcintosh (Seth Mcintosh) Visit Report for 12/20/2015 Chief Complaint Document Details Patient Name: Seth Mcintosh, Seth Mcintosh Date of Service: 12/20/2015 11:00 AM Medical Record Number: Seth Mcintosh Patient Account Number: [id number] Date of Birth/Sex: 02-Jul-1940 (75 y.o. Male) Treating RN: Ahmed Prima Primary Care Physician: [facility], Dossie Der Other Clinician: Referring Physician: Keith Rake Treating Physician/Extender: Frann Rider in Treatment: 13 Information Obtained from: Patient Chief Complaint Patient seen for complaints of Non-Healing Wound which she's had on the right lower extremity for about 5 weeks Electronic Signature(s) Signed: 12/20/2015 11:29:08 AM By: Christin Fudge MD, FACS Entered By: Christin Fudge on 12/20/2015 11:29:08 Seth Mcintosh (Seth Mcintosh) -------------------------------------------------------------------------------- HPI Details Patient Name: Seth Mcintosh Date of Service: 12/20/2015 11:00 AM Medical Record Number: Seth Mcintosh Patient Account Number: [id number] Date of Birth/Sex: 1940-09-06 (75 y.o. Male) Treating RN: Ahmed Prima Primary Care Physician: [facility], Dossie Der Other Clinician: Referring Physician: [facility], Dossie Der Treating Physician/Extender: Frann Rider in Treatment: 13 History of Present Illness Location: open wound right lower extremity lateral leg Quality: Patient reports experiencing a dull pain to affected area(s). Severity: Patient states wound are getting worse. Duration: Patient has had the wound for > 6 weeks prior to seeking treatment at the wound center Timing: Pain in wound is Intermittent (comes and goes Context: The wound occurred when the patient had a injury with a blunt object around 08/07/2015 Modifying Factors: a large swelling with drainage Associated Signs and Symptoms: Patient reports having increase swelling. HPI Description: 75 year old gentleman with known history of diabetes mellitus without complication had  a lacerated wound to his right lower extremity which was repaired about 4 weeks ago. Was seen by his PCP Dr. Donneta Romberg with a open wound on the right lateral leg with some discharge and erythema and he was placed on a blocks and local dressing was applied and referred to [place] for further care. Past medical history significant for diabetes mellitus type 2, coronary artery disease, hypertension, arthritis, depression, foot surgery o2, back surgery, CABG, TURP, right total knee replacement. He is a former smoker and quit in 1994. On questioning the patient he gives a history that he had a blunt injury to his right leg and this happened on April 15 of this year and about 2 weeks later he had blood draining from here and had to go to the ER and have sutures placed. However on looking at his medical records he was in the ER on 05/17/2015 with a blunt injury on his right lower extremity and at that stage x-ray showed no fracture and the right lower leg ultrasound showed no evidence of DVT. Diagnosis of a hematoma was made and he was given symptomatic treatment for this. The next time he was seen in the emergency room was on 08/11/2015 when the patient had a 10 cm laceration to the right lateral leg and this was sutured by the ER physician Dr. Dahlia Client. His blood alcohol level was 162 mg percent that day and the sutures were to be removed in 10 days' time. 09/23/2015 -- had his ultrasound of the left lower extremity for possible hematoma and the impression was there was no discrete drainable hematoma in the area of clinical concern on the left lower extremity. On reviewing his history he clearly states that he never had a large lacerated wound due to the injury or fall on April 19. He says he did have a few drinks but has not drunk and the hematoma which he had for a couple of months just burst open and caused  the big bleed and extrusion of clots. Electronic Signature(s) Signed: 12/20/2015 11:29:13 AM By:  Christin Fudge MD, FACS Entered By: Christin Fudge on 12/20/2015 11:29:13 Seth Mcintosh ([id number]) -------------------------------------------------------------------------------- Physical Exam Details Patient Name: Seth Mcintosh Date of Service: 12/20/2015 11:00 AM Medical Record Number: [id number] Patient Account Number: [id number] Date of Birth/Sex: 01-23-1941 (75 y.o. Male) Treating RN: Ahmed Prima Primary Care Physician: [facility], Dossie Der Other Clinician: Referring Physician: [facility], [facility] Treating Physician/Extender: Frann Rider in Treatment: 13 Constitutional . Pulse regular. Respirations normal and unlabored. Afebrile. . Eyes Nonicteric. Reactive to light. Ears, Nose, Mouth, and Throat Lips, teeth, and gums WNL.Marland Kitchen Moist mucosa without lesions. Neck supple and nontender. No palpable supraclavicular or cervical adenopathy. Normal sized without goiter. Respiratory WNL. No retractions.. Cardiovascular Pedal Pulses WNL. No clubbing, cyanosis or edema. Lymphatic No adneopathy. No adenopathy. No adenopathy. Musculoskeletal Adexa without tenderness or enlargement.. Digits and nails w/o clubbing, cyanosis, infection, petechiae, ischemia, or inflammatory conditions.. Integumentary (Hair, Skin) No suspicious lesions. No crepitus or fluctuance. No peri-wound warmth or erythema. No masses.Marland Kitchen Psychiatric Judgement and insight Intact.. No evidence of depression, anxiety, or agitation.. Notes the wound still continues to have a pocket inferior medially and there is healthy granulation tissue and no debridement was required. Electronic Signature(s) Signed: 12/20/2015 11:29:49 AM By: Christin Fudge MD, FACS Entered By: Christin Fudge on 12/20/2015 11:29:48 Seth Mcintosh ([id number]) -------------------------------------------------------------------------------- Physician Orders Details Patient Name: Seth Mcintosh Date of Service: 12/20/2015 11:00 AM Medical Record Number:  [id number] Patient Account Number: [id number] Date of Birth/Sex: 1941/04/19 (75 y.o. Male) Treating RN: Ahmed Prima Primary Care Physician: [facility], Dossie Der Other Clinician: Referring Physician: [facility], Dossie Der Treating Physician/Extender: Frann Rider in Treatment: 15 Verbal / Phone Orders: Yes ClinicianCarolyne Fiscal, Debi Read Back and Verified: Yes Diagnosis Coding Wound Cleansing Wound #1 Right,Lateral Lower Leg o Cleanse wound with mild soap and water o May Shower, gently pat wound dry prior to applying new dressing. Anesthetic Wound #1 Right,Lateral Lower Leg o Topical Lidocaine 4% cream applied to wound bed prior to debridement Skin Barriers/Peri-Wound Care Wound #1 Right,Lateral Lower Leg o Skin Prep Primary Wound Dressing Wound #1 Right,Lateral Lower Leg o Iodoform packing Gauze Secondary Dressing Wound #1 Right,Lateral Lower Leg o Non-adherent pad - telfa island dressing Dressing Change Frequency Wound #1 Right,Lateral Lower Leg o Change dressing every day. Edema Control Wound #1 Right,Lateral Lower Leg o Elevate legs to the level of the heart and pump ankles as often as possible o Support Garment 20-30 mm/Hg pressure to: - wear compression stockings Additional Orders / Instructions Wound #1 Right,Lateral Lower Leg o Increase protein intake. MOHANAD, GUSTAVE ([id number]) Electronic Signature(s) Signed: 12/20/2015 3:52:04 PM By: Christin Fudge MD, FACS Signed: 12/20/2015 4:32:54 PM By: Alric Quan Entered By: Alric Quan on 12/20/2015 11:21:26 Seth Mcintosh ([id number]) -------------------------------------------------------------------------------- Problem List Details Patient Name: Seth Mcintosh Date of Service: 12/20/2015 11:00 AM Medical Record Number: [id number] Patient Account Number: [id number] Date of Birth/Sex: 1940-08-08 (75 y.o. 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 Male) Treating RN: Ahmed Prima Primary Care Physician: [facility], Dossie Der Other Clinician: Referring Physician: [facility], Dossie Der Treating Physician/Extender: Frann Rider in Treatment: 13 Subjective Chief Complaint Information obtained from Patient Patient seen for complaints of Non-Healing Wound which she's had on the right lower extremity for about 5 weeks History of Present Illness (HPI) The following HPI elements were documented for the patient's wound: Location: open wound right lower extremity lateral leg Quality: Patient reports experiencing a dull pain to affected area(s). Severity: Patient states wound are getting worse. Duration: Patient has had the wound for > 6 weeks prior to seeking treatment at the wound center Timing: Pain in wound is Intermittent (comes and goes Context: The wound occurred when the patient had a injury with a blunt object around 08/07/2015 Modifying Factors: a large swelling with drainage Associated Signs and Symptoms: Patient reports having  increase swelling. 75 year old gentleman with known history of diabetes mellitus without complication had a lacerated wound to his right lower extremity which was repaired about 4 weeks ago. Was seen by his PCP Dr. Donneta Romberg with a open wound on the right lateral leg with some discharge and erythema and he was placed on a blocks and local dressing was applied and referred to [place] for further care. Past medical history significant for diabetes mellitus type 2, coronary artery disease, hypertension, arthritis, depression, foot surgery o2, back surgery, CABG, TURP, right total knee replacement. He is a former smoker and quit in 1994. On questioning the patient he gives a history that he had a blunt injury to his right leg and this happened on April 15 of this year and about 2 weeks later he had blood draining from here and had to go to the ER and have sutures placed. However on looking at his medical records he was in the ER on 05/17/2015 with a blunt injury on his right lower extremity and at that stage x-ray showed no fracture and the right lower leg ultrasound showed no evidence of DVT. Diagnosis of a hematoma was made and he was given symptomatic treatment for this. The next time he was seen in the emergency room was on 08/11/2015 when the patient had a 10 cm laceration to the right lateral leg and this was sutured by the ER physician Dr. Dahlia Client. His blood alcohol level was 162 mg percent that day and the sutures were to be removed in 10 days' time. 09/23/2015 -- had his ultrasound of the left lower extremity for possible hematoma and the impression was there was no discrete drainable hematoma in the area of clinical concern on the left lower extremity. On reviewing his history he clearly states that he never had a large lacerated wound due to the injury or fall on April 19. He says he did have a few drinks but has not drunk and the hematoma which he had for a Seth Mcintosh, Seth Mcintosh.  ([id number]) couple of months just burst open and caused the big bleed and extrusion of clots. Objective Constitutional Pulse regular. Respirations normal and unlabored. Afebrile. Vitals Time Taken: 11:15 AM, Height: 74 in, Weight: 265 lbs, BMI: 34, Temperature: 97.7 F, Pulse: 64 bpm, Respiratory Rate: 18 breaths/min, Blood Pressure: 128/68 mmHg. Eyes Nonicteric. Reactive to light. Ears, Nose, Mouth, and Throat Lips, teeth, and gums  WNL.. Moist mucosa without lesions. Neck supple and nontender. No palpable supraclavicular or cervical adenopathy. Normal sized without goiter. Respiratory WNL. No retractions.. Cardiovascular Pedal Pulses WNL. No clubbing, cyanosis or edema. Lymphatic No adneopathy. No adenopathy. No adenopathy. Musculoskeletal Adexa without tenderness or enlargement.. Digits and nails w/o clubbing, cyanosis, infection, petechiae, ischemia, or inflammatory conditions.Marland Kitchen Psychiatric Judgement and insight Intact.. No evidence of depression, anxiety, or agitation.. General Notes: the wound still continues to have a pocket inferior medially and there is healthy granulation tissue and no debridement was required. Integumentary (Hair, Skin) No suspicious lesions. No crepitus or fluctuance. No peri-wound warmth or erythema. No masses.. Wound #1 status is Open. Original cause of wound was Trauma. The wound is located on the Right,Lateral Lower Leg. The wound measures 1.2cm length x 0.5cm width x 0.3cm depth; 0.471cm^2 area and Seth Mcintosh, Seth Mcintosh. (Seth Mcintosh) 0.141cm^3 volume. The wound is limited to skin breakdown. There is no tunneling or undermining noted. There is a large amount of serosanguineous drainage noted. The wound margin is thickened. There is large (67-100%) red granulation within the wound bed. There is a small (1-33%) amount of necrotic tissue within the wound bed including Adherent Slough. The periwound skin appearance exhibited: Induration, Erythema. The  periwound skin appearance did not exhibit: Callus, Crepitus, Excoriation, Fluctuance, Friable, Localized Edema, Rash, Scarring, Dry/Scaly, Maceration, Moist, Atrophie Blanche, Cyanosis, Ecchymosis, Hemosiderin Staining, Mottled, Pallor, Rubor. The surrounding wound skin color is noted with erythema. Periwound temperature was noted as No Abnormality. The periwound has tenderness on palpation. 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 I have recommended we change her over to one-fourth inch iodoform packing strip and he can do this daily. Elevation, exercise and using his compression stockings have been discussed with him. He will see me back in 2 weeks because of the holiday Plan Wound Cleansing: Wound #1 Right,Lateral Lower Leg: Cleanse wound with mild soap and water May Shower, gently pat wound dry prior to applying new dressing. Anesthetic: Wound #1 Right,Lateral Lower Leg: Topical Lidocaine 4% cream applied to wound bed prior to debridement Skin Barriers/Peri-Wound Care: Wound #1 Right,Lateral Lower Leg: Skin Prep Primary Wound Dressing: Wound #1 Right,Lateral Lower Leg: Seth Mcintosh, Seth Mcintosh (Seth Mcintosh) Iodoform packing Gauze Secondary Dressing: Wound #1 Right,Lateral Lower Leg: Non-adherent pad - telfa island dressing Dressing Change Frequency: Wound #1 Right,Lateral Lower Leg: Change dressing every day. Edema Control: Wound #1 Right,Lateral Lower Leg: Elevate legs to the level of the heart and pump ankles as often as possible Support Garment 20-30 mm/Hg pressure to: - wear compression stockings Additional Orders / Instructions: Wound #1 Right,Lateral Lower  Leg: Increase protein intake. The wound is healing well and except for mild induration in that area everything is looking good. I have recommended we change her over to one-fourth inch iodoform packing strip and he can do this daily. Elevation, exercise and using his compression stockings have been discussed with him. He will see me back in 2 weeks because of the holiday Electronic Signature(s) Signed: 12/20/2015 11:31:52 AM By: Christin Fudge MD, FACS Entered By: Christin Fudge on 12/20/2015 11:31:52 Seth Mcintosh (Seth Mcintosh) -------------------------------------------------------------------------------- SuperBill Details Patient Name: Seth Mcintosh Date of Service: 12/20/2015 Medical Record Number: Seth Mcintosh Patient Account  Number: [id number] Date of Birth/Sex: [birth year] (75 y.o. 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.861 Other soft tissue disorders related to use, overuse and pressure, right lower leg Facility Procedures CPT4 Code: [id number] Description: 99213 - WOUND CARE VISIT-LEV 3 EST PT Modifier: Quantity: 1 Physician Procedures CPT4: Description Modifier Quantity Code [id number] - WC PHYS LEVEL 3 - EST PT 1 ICD-10 Description Diagnosis E11.622 Type 2 diabetes mellitus with other skin ulcer L97.213 Non-pressure chronic ulcer of right calf with necrosis of muscle M70.861 Other  soft tissue disorders related to use, overuse and pressure, right lower leg Electronic Signature(s) Signed: 12/20/2015 3:52:04 PM By: Christin Fudge MD, FACS Signed: 12/20/2015 4:32:54 PM By: Alric Quan Previous Signature: 12/20/2015 11:32:08 AM Version By: Christin Fudge MD, FACS Entered By: Alric Quan on 12/20/2015 12:57:12

## 2015-12-20 NOTE — Progress Notes (Signed)
GARLEN, NAFFZIGER ([id number]) Visit Report for 12/20/2015 Arrival Information Details Patient Name: Seth Mcintosh, Seth Mcintosh Date of Service: 12/20/2015 11:00 AM Medical Record Number: [id number] Patient Account Number: [id number] Date of Birth/Sex: 12-08-1940 (75 y.o. Male) Treating RN: Ahmed Prima Primary Care Physician: [facility], [place]ossie Der Other Clinician: Referring Physician: [facility], [place]ossie Der Treating Physician/Extender: Frann Rider in Treatment: 13 Visit Information History Since Last Visit All ordered tests and consults were completed: No Patient Arrived: Ambulatory Added or deleted any medications: No Arrival Time: 11:04 Any new allergies or adverse reactions: No Accompanied By: self Had a fall or experienced change in No Transfer Assistance: None activities of daily living that may affect Patient Identification Verified: Yes risk of falls: Secondary Verification Process Yes Signs or symptoms of abuse/neglect since last No Completed: visito Patient Requires Transmission-Based No Hospitalized since last visit: No Precautions: Pain Present Now: No Patient Has Alerts: Yes Patient Alerts: DM II Electronic Signature(s) Signed: 12/20/2015 4:32:54 PM By: Alric Quan Entered By: Alric Quan on 12/20/2015 11:05:19 Seth Mcintosh ([id number]) -------------------------------------------------------------------------------- Clinic Level of Care Assessment Details Patient Name: Seth Mcintosh Date of Service: 12/20/2015 11:00 AM Medical Record Number: [id number] Patient Account Number: [id number] Date of Birth/Sex: Nov 01, 1940 (75 y.o. Male) Treating RN: Ahmed Prima Primary Care Physician: [facility], [place]ossie Der Other Clinician: Referring Physician: [facility], [place] Treating Physician/Extender: Frann Rider in Treatment: 13 Clinic Level of Care Assessment Items TOOL 4 Quantity Score X - Use when only an EandM is performed on FOLLOW-UP visit 1 0 ASSESSMENTS - Nursing Assessment /  Reassessment X - Reassessment of Co-morbidities (includes updates in patient status) 1 10 X - Reassessment of Adherence to Treatment Plan 1 5 ASSESSMENTS - Wound and Skin Assessment / Reassessment X - Simple Wound Assessment / Reassessment - one wound 1 5 []  - Complex Wound Assessment / Reassessment - multiple wounds 0 []  - Dermatologic / Skin Assessment (not related to wound area) 0 ASSESSMENTS - Focused Assessment []  - Circumferential Edema Measurements - multi extremities 0 []  - Nutritional Assessment / Counseling / Intervention 0 []  - Lower Extremity Assessment (monofilament, tuning fork, pulses) 0 []  - Peripheral Arterial Disease Assessment (using hand held doppler) 0 ASSESSMENTS - Ostomy and/or Continence Assessment and Care []  - Incontinence Assessment and Management 0 []  - Ostomy Care Assessment and Management (repouching, etc.) 0 PROCESS - Coordination of Care X - Simple Patient / Family Education for ongoing care 1 15 []  - Complex (extensive) Patient / Family Education for ongoing care 0 []  - Staff obtains Programmer, systems, Records, Test Results / Process Orders 0 []  - Staff telephones HHA, Nursing Homes / Clarify orders / etc 0 []  - Routine Transfer to another Facility (non-emergent condition) 0 Seth Mcintosh, Seth Mcintosh ([id number]) []  - Routine Hospital Admission (non-emergent condition) 0 []  - New Admissions / Biomedical engineer / Ordering NPWT, Apligraf, etc. 0 []  - Emergency Hospital Admission (emergent condition) 0 X - Simple Discharge Coordination 1 10 []  - Complex (extensive) Discharge Coordination 0 PROCESS - Special Needs []  - Pediatric / Minor Patient Management 0 []  - Isolation Patient Management 0 []  - Hearing / Language / Visual special needs 0 []  - Assessment of Community assistance (transportation, D/C planning, etc.) 0 []  - Additional assistance / Altered mentation 0 []  - Support Surface(s) Assessment (bed, cushion, seat, etc.) 0 INTERVENTIONS - Wound Cleansing /  Measurement X - Simple Wound Cleansing - one wound 1 5 []  - Complex Wound Cleansing - multiple wounds 0 X - Wound Imaging (photographs - any number of  wounds) 1 5 []  - Wound Tracing (instead of photographs) 0 []  - Simple Wound Measurement - one wound 0 X - Complex Wound Measurement - multiple wounds 1 5 INTERVENTIONS - Wound Dressings X - Small Wound Dressing one or multiple wounds 1 10 []  - Medium Wound Dressing one or multiple wounds 0 []  - Large Wound Dressing one or multiple wounds 0 X - Application of Medications - topical 1 5 []  - Application of Medications - injection 0 INTERVENTIONS - Miscellaneous []  - External ear exam 0 Seth Mcintosh, Seth Mcintosh ([id number]) []  - Specimen Collection (cultures, biopsies, blood, body fluids, etc.) 0 []  - Specimen(s) / Culture(s) sent or taken to Lab for analysis 0 []  - Patient Transfer (multiple staff / Harrel Lemon Lift / Similar devices) 0 []  - Simple Staple / Suture removal (25 or less) 0 []  - Complex Staple / Suture removal (26 or more) 0 []  - Hypo / Hyperglycemic Management (close monitor of Blood Glucose) 0 []  - Ankle / Brachial Index (ABI) - do not check if billed separately 0 X - Vital Signs 1 5 Has the patient been seen at the hospital within the last three years: Yes Total Score: 80 Level Of Care: New/Established - Level 3 Electronic Signature(s) Signed: 12/20/2015 4:32:54 PM By: Alric Quan Entered By: Alric Quan on 12/20/2015 12:57:00 Seth Mcintosh ([id number]) -------------------------------------------------------------------------------- Encounter Discharge Information Details Patient Name: Seth Mcintosh Date of Service: 12/20/2015 11:00 AM Medical Record Number: [id number] Patient Account Number: [id number] Date of Birth/Sex: 1940/12/29 (75 y.o. Male) Treating RN: Ahmed Prima Primary Care Physician: [facility], Dossie Der Other Clinician: Referring Physician: [facility], [facility] Treating Physician/Extender: Frann Rider in  Treatment: 32 Encounter Discharge Information Items Discharge Pain Level: 0 Discharge Condition: Stable Ambulatory Status: Ambulatory Discharge Destination: Home Transportation: Ambulance Accompanied By: self Schedule Follow-up Appointment: Yes Medication Reconciliation completed and provided to Patient/Care Yes Fredrica Capano: Provided on Clinical Summary of Care: 12/20/2015 Form Type Recipient Paper Patient JK Electronic Signature(s) Signed: 12/20/2015 11:29:59 AM By: Ruthine Dose Entered By: Ruthine Dose on 12/20/2015 11:29:59 Seth Mcintosh ([id number]) -------------------------------------------------------------------------------- Lower Extremity Assessment Details Patient Name: Seth Mcintosh Date of Service: 12/20/2015 11:00 AM Medical Record Number: [id number] Patient Account Number: [id number] Date of Birth/Sex: 07-28-1940 (75 y.o. Male) Treating RN: Ahmed Prima Primary Care Physician: [facility], [place]ossie Der Other Clinician: Referring Physician: [facility], [facility] Treating Physician/Extender: Frann Rider in Treatment: 13 Vascular Assessment Pulses: Posterior Tibial Dorsalis Pedis Palpable: [Right:Yes] Extremity colors, hair growth, and conditions: Extremity Color: [Right:Normal] Temperature of Extremity: [Right:Warm] Capillary Refill: [Right:< 3 seconds] Electronic Signature(s) Signed: 12/20/2015 4:32:54 PM By: Alric Quan Entered By: Alric Quan on 12/20/2015 11:05:47 Seth Mcintosh ([id number]) -------------------------------------------------------------------------------- Multi Wound Chart Details Patient Name: Seth Mcintosh Date of Service: 12/20/2015 11:00 AM Medical Record Number: [id number] Patient Account Number: [id number] Date of Birth/Sex: 10-11-1940 (75 y.o. Male) Treating RN: Ahmed Prima Primary Care Physician: [facility], Dossie Der Other Clinician: Referring Physician: [facility], [facility] Treating Physician/Extender: Frann Rider in Treatment:  13 Vital Signs Height(in): 74 Pulse(bpm): 64 Weight(lbs): 265 Blood Pressure 128/68 (mmHg): Body Mass Index(BMI): 34 Temperature(F): 97.7 Respiratory Rate 18 (breaths/min): Photos: [1:No Photos] [N/A:N/A] Wound Location: [1:Right Lower Leg - Lateral] [N/A:N/A] Wounding Event: [1:Trauma] [N/A:N/A] Primary Etiology: [1:Trauma, Other] [N/A:N/A] Comorbid History: [1:Glaucoma, Sleep Apnea, Coronary Artery Disease, Hypertension, Type II Diabetes, Osteoarthritis] [N/A:N/A] Date Acquired: [1:08/07/2015] [N/A:N/A] Weeks of Treatment: [1:13] [N/A:N/A] Wound Status: [1:Open] [N/A:N/A] Measurements L x W x D 1.2x0.5x0.3 [N/A:N/A] (cm) Area (cm) : [1:0.471] [N/A:N/A] Volume (cm) : [1:0.141] [N/A:N/A] %  Reduction in Area: [1:88.00%] [N/A:N/A] % Reduction in Volume: 94.90% [N/A:N/A] Classification: [1:Full Thickness With Exposed Support Structures] [N/A:N/A] HBO Classification: [1:Grade 1] [N/A:N/A] Exudate Amount: [1:Large] [N/A:N/A] Exudate Type: [1:Serosanguineous] [N/A:N/A] Exudate Color: [1:red, brown] [N/A:N/A] Wound Margin: [1:Thickened] [N/A:N/A] Granulation Amount: [1:Large (67-100%)] [N/A:N/A] Granulation Quality: [1:Red] [N/A:N/A] Necrotic Amount: [1:Small (1-33%)] [N/A:N/A] Exposed Structures: [1:Fascia: No Fat: No] [N/A:N/A] Tendon: No Muscle: No Joint: No Bone: No Limited to Skin Breakdown Epithelialization: None N/A N/A Periwound Skin Texture: Induration: Yes N/A N/A Edema: No Excoriation: No Callus: No Crepitus: No Fluctuance: No Friable: No Rash: No Scarring: No Periwound Skin Maceration: No N/A N/A Moisture: Moist: No Dry/Scaly: No Periwound Skin Color: Erythema: Yes N/A N/A Atrophie Blanche: No Cyanosis: No Ecchymosis: No Hemosiderin Staining: No Mottled: No Pallor: No Rubor: No Temperature: No Abnormality N/A N/A Tenderness on Yes N/A N/A Palpation: Wound Preparation: Ulcer Cleansing: N/A N/A Rinsed/Irrigated with Saline Topical  Anesthetic Applied: Other: lidocaine 4% Treatment Notes Electronic Signature(s) Signed: 12/20/2015 4:32:54 PM By: Alric Quan Entered By: Alric Quan on 12/20/2015 11:18:44 Seth Mcintosh ([id number]) -------------------------------------------------------------------------------- Multi-Disciplinary Care Plan Details Patient Name: Seth Mcintosh Date of Service: 12/20/2015 11:00 AM Medical Record Number: [id number] Patient Account Number: [id number] Date of Birth/Sex: Nov 01, 1940 (75 y.o. Male) Treating RN: Ahmed Prima Primary Care Physician: [facility], Dossie Der Other Clinician: Referring Physician: [facility], Dossie Der Treating Physician/Extender: Frann Rider in Treatment: 58 Active Inactive Abuse / Safety / Falls / Self Care Management Nursing Diagnoses: Potential for falls Goals: Patient will remain injury free Date Initiated: 09/16/2015 Goal Status: Active Interventions: Assess fall risk on admission and as needed Notes: Orientation to the Wound Care Program Nursing Diagnoses: Knowledge deficit related to the wound healing center program Goals: Patient/caregiver will verbalize understanding of the [place] Program Date Initiated: 09/16/2015 Goal Status: Active Interventions: Provide education on orientation to the wound center Notes: Pain, Acute or Chronic Nursing Diagnoses: Pain, acute or chronic: actual or potential Potential alteration in comfort, pain Goals: Seth Mcintosh, Seth Mcintosh ([id number]) Patient will verbalize adequate pain control and receive pain control interventions during procedures as needed Date Initiated: 09/16/2015 Goal Status: Active Interventions: Assess comfort goal upon admission Complete pain assessment as per visit requirements Notes: Soft Tissue Infection Nursing Diagnoses: Impaired tissue integrity Goals: Patient will remain free of wound infection Date Initiated: 09/16/2015 Goal Status: Active Interventions: Assess  signs and symptoms of infection every visit Notes: Wound/Skin Impairment Nursing Diagnoses: Impaired tissue integrity Goals: Ulcer/skin breakdown will have a volume reduction of 30% by week 4 Date Initiated: 09/16/2015 Goal Status: Active Ulcer/skin breakdown will have a volume reduction of 50% by week 8 Date Initiated: 09/16/2015 Goal Status: Active Ulcer/skin breakdown will have a volume reduction of 80% by week 12 Date Initiated: 09/16/2015 Goal Status: Active Interventions: Assess patient/caregiver ability to obtain necessary supplies Assess ulceration(s) every visit Notes: Seth Mcintosh, Seth Mcintosh ([id number]) Electronic Signature(s) Signed: 12/20/2015 4:32:54 PM By: Alric Quan Entered By: Alric Quan on 12/20/2015 11:18:37 Seth Mcintosh ([id number]) -------------------------------------------------------------------------------- Pain Assessment Details Patient Name: Seth Mcintosh Date of Service: 12/20/2015 11:00 AM Medical Record Number: [id number] Patient Account Number: [id number] Date of Birth/Sex: 02-11-1941 (75 y.o. Male) Treating RN: Ahmed Prima Primary Care Physician: [facility], Dossie Der Other Clinician: Referring Physician: [facility], Dossie Der Treating Physician/Extender: Frann Rider in Treatment: 13 Active Problems Location of Pain Severity and Description of Pain Patient Has Paino No Site Locations With Dressing Change: No Pain Management and Medication Current Pain Management: Electronic Signature(s) Signed: 12/20/2015 4:32:54 PM By: Alric Quan Entered  By: Alric Quan on 12/20/2015 11:05:25 Seth Mcintosh ([id number]) -------------------------------------------------------------------------------- Patient/Caregiver Education Details Patient Name: Seth Mcintosh Date of Service: 12/20/2015 11:00 AM Medical Record Number: [id number] Patient Account Number: [id number] Date of Birth/Gender: 1940/10/17 (75 y.o. Male) Treating RN: Ahmed Prima Primary Care Physician: [facility], Dossie Der Other Clinician: Referring Physician: [facility], Dossie Der Treating Physician/Extender: Frann Rider in Treatment: 13 Education Assessment Education Provided To: Patient Education Topics Provided Wound/Skin Impairment: Handouts: Other: change dressing as ordered Methods: Demonstration, Explain/Verbal Responses: State content correctly Electronic Signature(s) Signed: 12/20/2015 4:32:54 PM By: Alric Quan Entered By: Alric Quan on 12/20/2015 11:22:31 Seth Mcintosh ([id number]) -------------------------------------------------------------------------------- Wound Assessment Details Patient Name: Seth Mcintosh Date of Service: 12/20/2015 11:00 AM Medical Record Number: [id number] Patient Account Number: [id number] Date of Birth/Sex: [birth year] (75 y.o. Male) Treating RN: Ahmed Prima Primary Care Physician: [facility], Dossie Der Other Clinician: Referring Physician: [facility], Dossie Der Treating Physician/Extender: Frann Rider in Treatment: 13 Wound Status Wound Number: 1 Primary Trauma, Other Etiology: Wound Location: Right Lower Leg - Lateral Wound Open Wounding Event: Trauma Status: Date Acquired: 08/07/2015 Comorbid Glaucoma, Sleep Apnea, Coronary Weeks Of Treatment: 13 History: Artery Disease, Hypertension, Type II Clustered Wound: No Diabetes, Osteoarthritis Photos Photo Uploaded By: Alric Quan on 12/20/2015 15:40:45 Wound Measurements Length: (cm) 1.2 Width: (cm) 0.5 Depth: (cm) 0.3 Area: (cm) 0.471 Volume: (cm) 0.141 % Reduction in Area: 88% % Reduction in Volume: 94.9% Epithelialization: None Tunneling: No Undermining: No Wound Description Full Thickness With Exposed Foul Odor Af Classification: Support Structures Diabetic Severity Grade 1 (Wagner): Wound Margin: Thickened Exudate Amount: Large Exudate Type: Serosanguineous Exudate Color: red, brown ter Cleansing: No Wound Bed Granulation  Amount: Large (67-100%) Exposed Structure Seth Mcintosh, Seth Mcintosh ([id number]) Granulation Quality: Red Fascia Exposed: No Necrotic Amount: Small (1-33%) Fat Layer Exposed: No Necrotic Quality: Adherent Slough Tendon Exposed: No Muscle Exposed: No Joint Exposed: No Bone Exposed: No Limited to Skin Breakdown Periwound Skin Texture Texture Color No Abnormalities Noted: No No Abnormalities Noted: No Callus: No Atrophie Blanche: No Crepitus: No Cyanosis: No Excoriation: No Ecchymosis: No Fluctuance: No Erythema: Yes Friable: No Hemosiderin Staining: No Induration: Yes Mottled: No Localized Edema: No Pallor: No Rash: No Rubor: No Scarring: No Temperature / Pain Moisture Temperature: No Abnormality No Abnormalities Noted: No Tenderness on Palpation: Yes Dry / Scaly: No Maceration: No Moist: No Wound Preparation Ulcer Cleansing: Rinsed/Irrigated with Saline Topical Anesthetic Applied: Other: lidocaine 4%, Treatment Notes Wound #1 (Right, Lateral Lower Leg) 1. Cleansed with: Clean wound with Normal Saline 2. Anesthetic 2% Lidocaine injectible with epinephrine prior to debridement 3. Peri-wound Care: Skin Prep 4. Dressing Applied: Iodoform packing Gauze 5. Secondary Cleary Signature(s) Signed: 12/20/2015 4:32:54 PM By: Alric Quan Entered By: Alric Quan on 12/20/2015 11:12:12 Seth Mcintosh ([id number]) Seth Mcintosh ([id number]) -------------------------------------------------------------------------------- [place] Details Patient Name: Seth Mcintosh Date of Service: 12/20/2015 11:00 AM Medical Record Number: [id number] Patient Account Number: [id number] Date of Birth/Sex: [birth year] (75 y.o. Male) Treating RN: Ahmed Prima Primary Care Physician: [facility], [place] Other Clinician: Referring Physician: [facility], [facility] Treating Physician/Extender: Frann Rider in Treatment: 13 Vital Signs Time Taken:  11:15 Temperature (F): 97.7 Height (in): 74 Pulse (bpm): 64 Weight (lbs): 265 Respiratory Rate (breaths/min): 18 Body Mass Index (BMI): 34 Blood Pressure (mmHg): 128/68 Reference Range: 80 - 120 mg / dl Electronic Signature(s) Signed: 12/20/2015 4:32:54 PM By: Alric Quan Entered By: Alric Quan on 12/20/2015 11:15:43

## 2016-01-03 ENCOUNTER — Encounter: Payer: Medicare Other | Attending: Surgery | Admitting: Surgery

## 2016-01-03 DIAGNOSIS — E11622 Type 2 diabetes mellitus with other skin ulcer: Secondary | ICD-10-CM | POA: Insufficient documentation

## 2016-01-03 DIAGNOSIS — Z87891 Personal history of nicotine dependence: Secondary | ICD-10-CM | POA: Insufficient documentation

## 2016-01-03 DIAGNOSIS — L97213 Non-pressure chronic ulcer of right calf with necrosis of muscle: Secondary | ICD-10-CM | POA: Insufficient documentation

## 2016-01-03 DIAGNOSIS — I10 Essential (primary) hypertension: Secondary | ICD-10-CM | POA: Insufficient documentation

## 2016-01-03 DIAGNOSIS — M70861 Other soft tissue disorders related to use, overuse and pressure, right lower leg: Secondary | ICD-10-CM | POA: Diagnosis not present

## 2016-01-03 DIAGNOSIS — I2510 Atherosclerotic heart disease of native coronary artery without angina pectoris: Secondary | ICD-10-CM | POA: Insufficient documentation

## 2016-01-03 DIAGNOSIS — G4730 Sleep apnea, unspecified: Secondary | ICD-10-CM | POA: Insufficient documentation

## 2016-01-03 DIAGNOSIS — F329 Major depressive disorder, single episode, unspecified: Secondary | ICD-10-CM | POA: Insufficient documentation

## 2016-01-03 DIAGNOSIS — L97811 Non-pressure chronic ulcer of other part of right lower leg limited to breakdown of skin: Secondary | ICD-10-CM | POA: Diagnosis not present

## 2016-01-03 DIAGNOSIS — M1990 Unspecified osteoarthritis, unspecified site: Secondary | ICD-10-CM | POA: Insufficient documentation

## 2016-01-03 DIAGNOSIS — K219 Gastro-esophageal reflux disease without esophagitis: Secondary | ICD-10-CM | POA: Insufficient documentation

## 2016-01-03 DIAGNOSIS — N400 Enlarged prostate without lower urinary tract symptoms: Secondary | ICD-10-CM | POA: Insufficient documentation

## 2016-01-03 NOTE — Progress Notes (Signed)
CRUIZ, IMPERIAL ([id number]) Visit Report for 01/03/2016 Chief Complaint Document Details Patient Name: Seth Mcintosh, Seth Mcintosh Date of Service: 01/03/2016 11:00 AM Medical Record Number: [id number] Patient Account Number: [id number] Date of Birth/Sex: 1940/10/30 (75 y.o. Male) Treating RN: Montey Hora Primary Care Physician: [facility], [place] Der Other Clinician: Referring Physician: Keith Rake Treating Physician/Extender: Frann Rider in Treatment: 15 Information Obtained from: Patient Chief Complaint Patient seen for complaints of Non-Healing Wound which she's had on the right lower extremity for about 5 weeks Electronic Signature(s) Signed: 01/03/2016 11:29:37 AM By: Christin Fudge MD, FACS Entered By: Christin Fudge on 01/03/2016 11:29:37 Seth Mcintosh ([id number]) -------------------------------------------------------------------------------- HPI Details Patient Name: Seth Mcintosh Date of Service: 01/03/2016 11:00 AM Medical Record Number: [id number] Patient Account Number: [id number] Date of Birth/Sex: 08-20-1940 (75 y.o. Male) Treating RN: Montey Hora Primary Care Physician: [facility], [place]ossie Der Other Clinician: Referring Physician: Keith Rake Treating Physician/Extender: Frann Rider in Treatment: 15 History of Present Illness Location: open wound right lower extremity lateral leg Quality: Patient reports experiencing a dull pain to affected area(s). Severity: Patient states wound are getting worse. Duration: Patient has had the wound for > 6 weeks prior to seeking treatment at the wound center Timing: Pain in wound is Intermittent (comes and goes Context: The wound occurred when the patient had a injury with a blunt object around 08/07/2015 Modifying Factors: a large swelling with drainage Associated Signs and Symptoms: Patient reports having increase swelling. HPI Description: 75 year old gentleman with known history of diabetes mellitus without complication had  a lacerated wound to his right lower extremity which was repaired about 4 weeks ago. Was seen by his PCP Dr. Donneta Romberg with a open wound on the right lateral leg with some discharge and erythema and he was placed on a blocks and local dressing was applied and referred to [place] for further care. Past medical history significant for diabetes mellitus type 2, coronary artery disease, hypertension, arthritis, depression, foot surgery o2, back surgery, CABG, TURP, right total knee replacement. He is a former smoker and quit in 1994. On questioning the patient he gives a history that he had a blunt injury to his right leg and this happened on April 15 of this year and about 2 weeks later he had blood draining from here and had to go to the ER and have sutures placed. However on looking at his medical records he was in the ER on 05/17/2015 with a blunt injury on his right lower extremity and at that stage x-ray showed no fracture and the right lower leg ultrasound showed no evidence of DVT. Diagnosis of a hematoma was made and he was given symptomatic treatment for this. The next time he was seen in the emergency room was on 08/11/2015 when the patient had a 10 cm laceration to the right lateral leg and this was sutured by the ER physician Dr. Dahlia Client. His blood alcohol level was 162 mg percent that day and the sutures were to be removed in 10 days' time. 09/23/2015 -- had his ultrasound of the left lower extremity for possible hematoma and the impression was there was no discrete drainable hematoma in the area of clinical concern on the left lower extremity. On reviewing his history he clearly states that he never had a large lacerated wound due to the injury or fall on April 19. He says he did have a few drinks but has not drunk and the hematoma which he had for a couple of months just burst open and caused  the big bleed and extrusion of clots. Electronic Signature(s) Signed: 01/03/2016 11:29:48 AM By:  Christin Fudge MD, FACS Entered By: Christin Fudge on 01/03/2016 11:29:48 Seth Mcintosh ([id number]) -------------------------------------------------------------------------------- Physical Exam Details Patient Name: Seth Mcintosh Date of Service: 01/03/2016 11:00 AM Medical Record Number: [id number] Patient Account Number: [id number] Date of Birth/Sex: 1940-10-01 (75 y.o. Male) Treating RN: Montey Hora Primary Care Physician: [facility], [place]ossie Der Other Clinician: Referring Physician: [facility], [facility] Treating Physician/Extender: Frann Rider in Treatment: 15 Constitutional . Pulse regular. Respirations normal and unlabored. Afebrile. . Eyes Nonicteric. Reactive to light. Ears, Nose, Mouth, and Throat Lips, teeth, and gums WNL.Marland Kitchen Moist mucosa without lesions. Neck supple and nontender. No palpable supraclavicular or cervical adenopathy. Normal sized without goiter. Respiratory WNL. No retractions.. Cardiovascular Pedal Pulses WNL. No clubbing, cyanosis or edema. Lymphatic No adneopathy. No adenopathy. No adenopathy. Musculoskeletal Adexa without tenderness or enlargement.. Digits and nails w/o clubbing, cyanosis, infection, petechiae, ischemia, or inflammatory conditions.. Integumentary (Hair, Skin) No suspicious lesions. No crepitus or fluctuance. No peri-wound warmth or erythema. No masses.Marland Kitchen Psychiatric Judgement and insight Intact.. No evidence of depression, anxiety, or agitation.. Notes the wound continues to have healthy granulation tissue at the sinus tract and the superior part of it is epithelialized. Electronic Signature(s) Signed: 01/03/2016 11:30:37 AM By: Christin Fudge MD, FACS Entered By: Christin Fudge on 01/03/2016 11:30:37 Seth Mcintosh ([id number]) -------------------------------------------------------------------------------- Physician Orders Details Patient Name: Seth Mcintosh Date of Service: 01/03/2016 11:00 AM Medical Record Number:  [id number] Patient Account Number: [id number] Date of Birth/Sex: March 17, 1941 (75 y.o. Male) Treating RN: Montey Hora Primary Care Physician: [facility], [place]ossie Der Other Clinician: Referring Physician: [facility], [place]ossie Der Treating Physician/Extender: Frann Rider in Treatment: 15 Verbal / Phone Orders: Yes Clinician: Montey Hora Read Back and Verified: Yes Diagnosis Coding Wound Cleansing Wound #1 Right,Lateral Lower Leg o Cleanse wound with mild soap and water o May Shower, gently pat wound dry prior to applying new dressing. Anesthetic Wound #1 Right,Lateral Lower Leg o Topical Lidocaine 4% cream applied to wound bed prior to debridement Skin Barriers/Peri-Wound Care Wound #1 Right,Lateral Lower Leg o Skin Prep Primary Wound Dressing Wound #1 Right,Lateral Lower Leg o Iodoform packing Gauze Secondary Dressing Wound #1 Right,Lateral Lower Leg o Non-adherent pad - telfa island dressing Dressing Change Frequency Wound #1 Right,Lateral Lower Leg o Change dressing every day. Edema Control Wound #1 Right,Lateral Lower Leg o Elevate legs to the level of the heart and pump ankles as often as possible o Support Garment 20-30 mm/Hg pressure to: - wear compression stockings Additional Orders / Instructions Wound #1 Right,Lateral Lower Leg o Increase protein intake. Seth Mcintosh, Seth Mcintosh ([id number]) Electronic Signature(s) Signed: 01/03/2016 4:17:39 PM By: Christin Fudge MD, FACS Signed: 01/03/2016 4:43:32 PM By: Montey Hora Entered By: Montey Hora on 01/03/2016 11:27:46 Seth Mcintosh ([id number]) -------------------------------------------------------------------------------- Problem List Details Patient Name: Seth Mcintosh Date of Service: 01/03/2016 11:00 AM Medical Record Number: [id number] Patient Account Number: [id number] Date of Birth/Sex: 02-04-1941 (75 y.o. 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 M70.861 Other soft tissue disorders related to use, overuse and 09/16/2015 Yes pressure, right lower leg Inactive Problems Resolved Problems Electronic Signature(s) Signed: 01/03/2016 11:29:19 AM By: Christin Fudge MD, FACS Entered By:  Christin Fudge on 01/03/2016 11:29:18 Seth Mcintosh, Seth Mcintosh ([id number]) -------------------------------------------------------------------------------- Progress Note Details Patient Name: Seth Mcintosh, Seth Mcintosh Date of Service: 01/03/2016 11:00 AM Medical Record Number: [id number] Patient Account Number: [id number] Date of Birth/Sex: Jun 30, 1940 (75 y.o. Male) Treating RN: Montey Hora Primary Care Physician: [facility], [place]ie Der Other Clinician: Referring Physician: Keith Rake Treating Physician/Extender: Frann Rider in Treatment: 15 Subjective Chief Complaint Information obtained from Patient Patient seen for complaints of Non-Healing Wound which she's had on the right lower extremity for about 5 weeks History of Present Illness (HPI) The following HPI elements were documented for the patient's wound: Location: open wound right lower extremity lateral leg Quality: Patient reports experiencing a dull pain to affected area(s). Severity: Patient states wound are getting worse. Duration: Patient has had the wound for > 6 weeks prior to seeking treatment at the wound center Timing: Pain in wound is Intermittent (comes and goes Context: The wound occurred when the patient had a injury with a blunt object around 08/07/2015 Modifying Factors: a large swelling with drainage Associated Signs and Symptoms: Patient reports having increase  swelling. 75 year old gentleman with known history of diabetes mellitus without complication had a lacerated wound to his right lower extremity which was repaired about 4 weeks ago. Was seen by his PCP Dr. Donneta Romberg with a open wound on the right lateral leg with some discharge and erythema and he was placed on a blocks and local dressing was applied and referred to [place] for further care. Past medical history significant for diabetes mellitus type 2, coronary artery disease, hypertension, arthritis, depression, foot surgery o2, back surgery, CABG, TURP, right total knee replacement. He is a former smoker and quit in 1994. On questioning the patient he gives a history that he had a blunt injury to his right leg and this happened on April 15 of this year and about 2 weeks later he had blood draining from here and had to go to the ER and have sutures placed. However on looking at his medical records he was in the ER on 05/17/2015 with a blunt injury on his right lower extremity and at that stage x-ray showed no fracture and the right lower leg ultrasound showed no evidence of DVT. Diagnosis of a hematoma was made and he was given symptomatic treatment for this. The next time he was seen in the emergency room was on 08/11/2015 when the patient had a 10 cm laceration to the right lateral leg and this was sutured by the ER physician Dr. Dahlia Client. His blood alcohol level was 162 mg percent that day and the sutures were to be removed in 10 days' time. 09/23/2015 -- had his ultrasound of the left lower extremity for possible hematoma and the impression was there was no discrete drainable hematoma in the area of clinical concern on the left lower extremity. On reviewing his history he clearly states that he never had a large lacerated wound due to the injury or fall on April 19. He says he did have a few drinks but has not drunk and the hematoma which he had for a Seth Mcintosh, Seth Mcintosh. ([id number]) couple of  months just burst open and caused the big bleed and extrusion of clots. Objective Constitutional Pulse regular. Respirations normal and unlabored. Afebrile. Vitals Time Taken: 11:13 AM, Height: 74 in, Weight: 265 lbs, BMI: 34, Temperature: 98.1 F, Pulse: 71 bpm, Respiratory Rate: 18 breaths/min, Blood Pressure: 125/67 mmHg. Eyes Nonicteric. Reactive to light. Ears, Nose, Mouth, and Throat Lips, teeth, and gums WNL.Marland Kitchen  Moist mucosa without lesions. Neck supple and nontender. No palpable supraclavicular or cervical adenopathy. Normal sized without goiter. Respiratory WNL. No retractions.. Cardiovascular Pedal Pulses WNL. No clubbing, cyanosis or edema. Lymphatic No adneopathy. No adenopathy. No adenopathy. Musculoskeletal Adexa without tenderness or enlargement.. Digits and nails w/o clubbing, cyanosis, infection, petechiae, ischemia, or inflammatory conditions.Marland Kitchen Psychiatric Judgement and insight Intact.. No evidence of depression, anxiety, or agitation.. General Notes: the wound continues to have healthy granulation tissue at the sinus tract and the superior part of it is epithelialized. Integumentary (Hair, Skin) No suspicious lesions. No crepitus or fluctuance. No peri-wound warmth or erythema. No masses.. Wound #1 status is Open. Original cause of wound was Trauma. The wound is located on the Right,Lateral Lower Leg. The wound measures 1cm length x 0.3cm width x 0.2cm depth; 0.236cm^2 area and Seth Mcintosh, Seth E. ([id number]) 0.047cm^3 volume. The wound is limited to skin breakdown. There is no tunneling or undermining noted. There is a large amount of serosanguineous drainage noted. The wound margin is thickened. There is large (67-100%) red granulation within the wound bed. There is a small (1-33%) amount of necrotic tissue within the wound bed including Adherent Slough. The periwound skin appearance exhibited: Induration, Erythema. The periwound skin appearance did not exhibit:  Callus, Crepitus, Excoriation, Fluctuance, Friable, Localized Edema, Rash, Scarring, Dry/Scaly, Maceration, Moist, Atrophie Blanche, Cyanosis, Ecchymosis, Hemosiderin Staining, Mottled, Pallor, Rubor. The surrounding wound skin color is noted with erythema. Periwound temperature was noted as No Abnormality. The periwound has tenderness on palpation. 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 Anesthetic: Wound #1 Right,Lateral Lower Leg: Topical Lidocaine 4% cream applied to wound bed prior to debridement Skin Barriers/Peri-Wound Care: Wound #1 Right,Lateral Lower Leg: Skin Prep Primary Wound Dressing: Wound #1 Right,Lateral Lower Leg: Iodoform packing Gauze Secondary Dressing: Wound #1 Right,Lateral Lower Leg: Non-adherent pad - telfa island dressing Dressing Change Frequency: Wound #1 Right,Lateral Lower Leg: Change dressing every day. Edema Control: Wound #1 Right,Lateral Lower Leg: Elevate legs to the level of the heart and pump ankles as often as possible Seth Mcintosh, Seth Mcintosh. ([id number]) Support Garment 20-30 mm/Hg pressure to: - wear compression stockings Additional Orders / Instructions: Wound #1 Right,Lateral Lower Leg: Increase protein intake. I have recommended we continue with one-fourth inch iodoform packing strip and he can do this daily. Elevation, exercise and using his compression stockings have been discussed with him. He will see me back in 2 weeks because he is going to be away on holiday Electronic Signature(s) Signed: 01/03/2016 11:31:51 AM By: Christin Fudge MD, FACS Entered By: Christin Fudge on 01/03/2016 11:31:51 Seth Mcintosh ([id number]) -------------------------------------------------------------------------------- SuperBill Details Patient Name: Seth Mcintosh Date of Service: 01/03/2016 Medical Record Number: [id number] Patient Account Number: [id number] Date of Birth/Sex: 08-08-1940 (75 y.o. Male) Treating RN: Montey Hora Primary Care Physician: [facility], [place]ossie Der Other Clinician: Referring Physician: [facility], [place]ossie Der Treating Physician/Extender: Frann Rider in Treatment: 15 Diagnosis Coding ICD-10 Codes Code Description E11.622 Type 2 diabetes mellitus with other skin ulcer L97.213 Non-pressure chronic ulcer of right calf with necrosis of muscle M70.861 Other soft tissue disorders related to use, overuse and pressure, right lower leg Facility Procedures CPT4 Code:  [id number] Description: 99213 - WOUND CARE VISIT-LEV 3 EST PT Modifier: Quantity: 1 Physician Procedures CPT4: Description Modifier Quantity Code [id number] - WC PHYS LEVEL 3 - EST PT 1 ICD-10 Description Diagnosis E11.622 Type 2 diabetes mellitus with other skin ulcer M70.861 Other soft tissue disorders related to use, overuse and pressure, right lower  leg L97.213 Non-pressure chronic ulcer of right calf with necrosis of muscle Electronic Signature(s) Signed: 01/03/2016 11:45:03 AM By: Montey Hora Signed: 01/03/2016 4:17:39 PM By: Christin Fudge MD, FACS Previous Signature: 01/03/2016 11:32:05 AM Version By: Christin Fudge MD, FACS Entered By: Montey Hora on 01/03/2016 11:45:03

## 2016-01-03 NOTE — Progress Notes (Signed)
Seth Mcintosh ([id number]) Visit Report for 01/03/2016 Arrival Information Details Patient Name: Seth Mcintosh Date of Service: 01/03/2016 11:00 AM Medical Record Number: [id number] Patient Account Number: [id number] Date of Birth/Sex: 05-Jun-1940 (75 y.o. Male) Treating RN: Montey Hora Primary Care Physician: [facility], Dossie Der Other Clinician: Referring Physician: [facility], Dossie Der Treating Physician/Extender: Frann Rider in Treatment: 15 Visit Information History Since Last Visit Added or deleted any medications: No Patient Arrived: Ambulatory Any new allergies or adverse reactions: No Arrival Time: 11:12 Had a fall or experienced change in No Accompanied By: self activities of daily living that may affect Transfer Assistance: None risk of falls: Patient Identification Verified: Yes Signs or symptoms of abuse/neglect since last No Secondary Verification Process Yes visito Completed: Hospitalized since last visit: No Patient Requires Transmission-Based No Pain Present Now: No Precautions: Patient Has Alerts: Yes Patient Alerts: DM II Electronic Signature(s) Signed: 01/03/2016 4:43:32 PM By: Montey Hora Entered By: Montey Hora on 01/03/2016 11:12:54 Seth Mcintosh ([id number]) -------------------------------------------------------------------------------- Clinic Level of Care Assessment Details Patient Name: Seth Mcintosh Date of Service: 01/03/2016 11:00 AM Medical Record Number: [id number] Patient Account Number: [id number] Date of Birth/Sex: 02/05/1941 (75 y.o. Male) Treating RN: Montey Hora Primary Care Physician: [facility], Dossie Der Other Clinician: Referring Physician: [facility], [facility] Treating Physician/Extender: Frann Rider in Treatment: 15 Clinic Level of Care Assessment Items TOOL 4 Quantity Score []  - Use when only an EandM is performed on FOLLOW-UP visit 0 ASSESSMENTS - Nursing Assessment / Reassessment X - Reassessment of Co-morbidities (includes  updates in patient status) 1 10 X - Reassessment of Adherence to Treatment Plan 1 5 ASSESSMENTS - Wound and Skin Assessment / Reassessment X - Simple Wound Assessment / Reassessment - one wound 1 5 []  - Complex Wound Assessment / Reassessment - multiple wounds 0 []  - Dermatologic / Skin Assessment (not related to wound area) 0 ASSESSMENTS - Focused Assessment []  - Circumferential Edema Measurements - multi extremities 0 []  - Nutritional Assessment / Counseling / Intervention 0 X - Lower Extremity Assessment (monofilament, tuning fork, pulses) 1 5 []  - Peripheral Arterial Disease Assessment (using hand held doppler) 0 ASSESSMENTS - Ostomy and/or Continence Assessment and Care []  - Incontinence Assessment and Management 0 []  - Ostomy Care Assessment and Management (repouching, etc.) 0 PROCESS - Coordination of Care X - Simple Patient / Family Education for ongoing care 1 15 []  - Complex (extensive) Patient / Family Education for ongoing care 0 []  - Staff obtains Programmer, systems, Records, Test Results / Process Orders 0 []  - Staff telephones HHA, Nursing Homes / Clarify orders / etc 0 []  - Routine Transfer to another Facility (non-emergent condition) 0 Seth Mcintosh ([id number]) []  - Routine Hospital Admission (non-emergent condition) 0 []  - New Admissions / Biomedical engineer / Ordering NPWT, Apligraf, etc. 0 []  - Emergency Hospital Admission (emergent condition) 0 X - Simple Discharge Coordination 1 10 []  - Complex (extensive) Discharge Coordination 0 PROCESS - Special Needs []  - Pediatric / Minor Patient Management 0 []  - Isolation Patient Management 0 []  - Hearing / Language / Visual special needs 0 []  - Assessment of Community assistance (transportation, D/C planning, etc.) 0 []  - Additional assistance / Altered mentation 0 []  - Support Surface(s) Assessment (bed, cushion, seat, etc.) 0 INTERVENTIONS - Wound Cleansing / Measurement X - Simple Wound Cleansing - one wound 1 5 []   - Complex Wound Cleansing - multiple wounds 0 X - Wound Imaging (photographs - any number of wounds) 1 5 []  - Wound Tracing (instead  of photographs) 0 X - Simple Wound Measurement - one wound 1 5 []  - Complex Wound Measurement - multiple wounds 0 INTERVENTIONS - Wound Dressings X - Small Wound Dressing one or multiple wounds 1 10 []  - Medium Wound Dressing one or multiple wounds 0 []  - Large Wound Dressing one or multiple wounds 0 []  - Application of Medications - topical 0 []  - Application of Medications - injection 0 INTERVENTIONS - Miscellaneous []  - External ear exam 0 Seth Mcintosh ([id number]) []  - Specimen Collection (cultures, biopsies, blood, body fluids, etc.) 0 []  - Specimen(s) / Culture(s) sent or taken to Lab for analysis 0 []  - Patient Transfer (multiple staff / Harrel Lemon Lift / Similar devices) 0 []  - Simple Staple / Suture removal (25 or less) 0 []  - Complex Staple / Suture removal (26 or more) 0 []  - Hypo / Hyperglycemic Management (close monitor of Blood Glucose) 0 []  - Ankle / Brachial Index (ABI) - do not check if billed separately 0 X - Vital Signs 1 5 Has the patient been seen at the hospital within the last three years: Yes Total Score: 80 Level Of Care: New/Established - Level 3 Electronic Signature(s) Signed: 01/03/2016 4:43:32 PM By: Montey Hora Entered By: Montey Hora on 01/03/2016 11:44:54 Seth Mcintosh ([id number]) -------------------------------------------------------------------------------- Encounter Discharge Information Details Patient Name: Seth Mcintosh Date of Service: 01/03/2016 11:00 AM Medical Record Number: [id number] Patient Account Number: [id number] Date of Birth/Sex: [birth year] (75 y.o. Male) Treating RN: Montey Hora Primary Care Physician: [facility], Dossie Der Other Clinician: Referring Physician: [facility], [facility] Treating Physician/Extender: Frann Rider in Treatment: 15 Encounter Discharge Information Items Discharge Pain  Level: 0 Discharge Condition: Stable Ambulatory Status: Ambulatory Discharge Destination: Home Transportation: Private Auto Accompanied By: self Schedule Follow-up Appointment: Yes Medication Reconciliation completed and provided to Patient/Care No Camilo Mander: Provided on Clinical Summary of Care: 01/03/2016 Form Type Recipient Paper Patient JK Electronic Signature(s) Signed: 01/03/2016 11:46:38 AM By: Montey Hora Previous Signature: 01/03/2016 11:45:58 AM Version By: Montey Hora Previous Signature: 01/03/2016 11:34:20 AM Version By: Ruthine Dose Entered By: Montey Hora on 01/03/2016 11:46:37 Seth Mcintosh ([id number]) -------------------------------------------------------------------------------- Lower Extremity Assessment Details Patient Name: Seth Mcintosh Date of Service: 01/03/2016 11:00 AM Medical Record Number: [id number] Patient Account Number: [id number] Date of Birth/Sex: 04/26/1940 (75 y.o. Male) Treating RN: Montey Hora Primary Care Physician: [facility], Dossie Der Other Clinician: Referring Physician: [facility], Dossie Der Treating Physician/Extender: Frann Rider in Treatment: 15 Edema Assessment Assessed: [Left: No] [Right: No] Edema: [Left: Ye] [Right: s] Vascular Assessment Pulses: Posterior Tibial Dorsalis Pedis Palpable: [Right:Yes] Extremity colors, hair growth, and conditions: Extremity Color: [Right:Normal] Hair Growth on Extremity: [Right:Yes] Temperature of Extremity: [Right:Warm] Capillary Refill: [Right:< 3 seconds] Electronic Signature(s) Signed: 01/03/2016 4:43:32 PM By: Montey Hora Entered By: Montey Hora on 01/03/2016 11:16:53 Seth Mcintosh ([id number]) -------------------------------------------------------------------------------- Multi Wound Chart Details Patient Name: Seth Mcintosh Date of Service: 01/03/2016 11:00 AM Medical Record Number: [id number] Patient Account Number: [id number] Date of Birth/Sex: 06/20/1940 (75  y.o. Male) Treating RN: Montey Hora Primary Care Physician: [facility], Dossie Der Other Clinician: Referring Physician: [facility], [facility] Treating Physician/Extender: Frann Rider in Treatment: 15 Vital Signs Height(in): 74 Pulse(bpm): 71 Weight(lbs): 265 Blood Pressure 125/67 (mmHg): Body Mass Index(BMI): 34 Temperature(F): 98.1 Respiratory Rate 18 (breaths/min): Photos: [N/A:N/A] Wound Location: Right Lower Leg - Lateral N/A N/A Wounding Event: Trauma N/A N/A Primary Etiology: Diabetic Wound/Ulcer of N/A N/A the Lower Extremity Comorbid History: Glaucoma, Sleep Apnea, N/A N/A Coronary Artery Disease, Hypertension, Type II Diabetes, Osteoarthritis  Date Acquired: 08/07/2015 N/A N/A Weeks of Treatment: 15 N/A N/A Wound Status: Open N/A N/A Measurements L x W x D 1x0.3x0.2 N/A N/A (cm) Area (cm) : 0.236 N/A N/A Volume (cm) : 0.047 N/A N/A % Reduction in Area: 94.00% N/A N/A % Reduction in Volume: 98.30% N/A N/A Classification: Grade 1 N/A N/A Exudate Amount: Large N/A N/A Exudate Type: Serosanguineous N/A N/A Exudate Color: red, brown N/A N/A Wound Margin: Thickened N/A N/A Granulation Amount: Large (67-100%) N/A N/A JESTEN, MCCLELLAND ([id number]) Granulation Quality: Red N/A N/A Necrotic Amount: Small (1-33%) N/A N/A Exposed Structures: Fascia: No N/A N/A Fat: No Tendon: No Muscle: No Joint: No Bone: No Limited to Skin Breakdown Epithelialization: None N/A N/A Periwound Skin Texture: Induration: Yes N/A N/A Edema: No Excoriation: No Callus: No Crepitus: No Fluctuance: No Friable: No Rash: No Scarring: No Periwound Skin Maceration: No N/A N/A Moisture: Moist: No Dry/Scaly: No Periwound Skin Color: Erythema: Yes N/A N/A Atrophie Blanche: No Cyanosis: No Ecchymosis: No Hemosiderin Staining: No Mottled: No Pallor: No Rubor: No Temperature: No Abnormality N/A N/A Tenderness on Yes N/A N/A Palpation: Wound Preparation: Ulcer Cleansing: N/A  N/A Rinsed/Irrigated with Saline Topical Anesthetic Applied: Other: lidocaine 4% Treatment Notes Electronic Signature(s) Signed: 01/03/2016 4:43:32 PM By: Montey Hora Entered By: Montey Hora on 01/03/2016 11:22:48 Seth Mcintosh ([id number]) -------------------------------------------------------------------------------- Multi-Disciplinary Care Plan Details Patient Name: Seth Mcintosh Date of Service: 01/03/2016 11:00 AM Medical Record Number: [id number] Patient Account Number: [id number] Date of Birth/Sex: Feb 16, 1941 (75 y.o. Male) Treating RN: Montey Hora Primary Care Physician: [facility], Dossie Der Other Clinician: Referring Physician: Keith Rake Treating Physician/Extender: Frann Rider in Treatment: 15 Active Inactive Abuse / Safety / Falls / Self Care Management Nursing Diagnoses: Potential for falls Goals: Patient will remain injury free Date Initiated: 09/16/2015 Goal Status: Active Interventions: Assess fall risk on admission and as needed Notes: Orientation to the Wound Care Program Nursing Diagnoses: Knowledge deficit related to the wound healing center program Goals: Patient/caregiver will verbalize understanding of the [facility] Program Date Initiated: 09/16/2015 Goal Status: Active Interventions: Provide education on orientation to the wound center Notes: Pain, Acute or Chronic Nursing Diagnoses: Pain, acute or chronic: actual or potential Potential alteration in comfort, pain Goals: TREVAR, MAROVICH ([id number]) Patient will verbalize adequate pain control and receive pain control interventions during procedures as needed Date Initiated: 09/16/2015 Goal Status: Active Interventions: Assess comfort goal upon admission Complete pain assessment as per visit requirements Notes: Soft Tissue Infection Nursing Diagnoses: Impaired tissue integrity Goals: Patient will remain free of wound infection Date Initiated: 09/16/2015 Goal  Status: Active Interventions: Assess signs and symptoms of infection every visit Notes: Wound/Skin Impairment Nursing Diagnoses: Impaired tissue integrity Goals: Ulcer/skin breakdown will have a volume reduction of 30% by week 4 Date Initiated: 09/16/2015 Goal Status: Active Ulcer/skin breakdown will have a volume reduction of 50% by week 8 Date Initiated: 09/16/2015 Goal Status: Active Ulcer/skin breakdown will have a volume reduction of 80% by week 12 Date Initiated: 09/16/2015 Goal Status: Active Interventions: Assess patient/caregiver ability to obtain necessary supplies Assess ulceration(s) every visit Notes: DELOS, BEYLER ([id number]) Electronic Signature(s) Signed: 01/03/2016 4:43:32 PM By: Montey Hora Entered By: Montey Hora on 01/03/2016 11:22:30 Seth Mcintosh ([id number]) -------------------------------------------------------------------------------- Pain Assessment Details Patient Name: Seth Mcintosh Date of Service: 01/03/2016 11:00 AM Medical Record Number: [id number] Patient Account Number: [id number] Date of Birth/Sex: [birth year] (75 y.o. Male) Treating RN: Montey Hora Primary Care Physician: Keith Rake Other Clinician: Referring Physician: [facility], Dossie Der  Treating Physician/Extender: Frann Rider in Treatment: 15 Active Problems Location of Pain Severity and Description of Pain Patient Has Paino No Site Locations Pain Management and Medication Current Pain Management: Notes Topical or injectable lidocaine is offered to patient for acute pain when surgical debridement is performed. If needed, Patient is instructed to use over the counter pain medication for the following 24-48 hours after debridement. Wound care MDs do not prescribed pain medications. Patient has chronic pain or uncontrolled pain. Patient has been instructed to make an appointment with their Primary Care Physician for pain management. Electronic Signature(s) Signed:  01/03/2016 4:43:32 PM By: Montey Hora Entered By: Montey Hora on 01/03/2016 11:13:02 Seth Mcintosh ([id number]) -------------------------------------------------------------------------------- Patient/Caregiver Education Details Patient Name: Seth Mcintosh Date of Service: 01/03/2016 11:00 AM Medical Record Number: [id number] Patient Account Number: [id number] Date of Birth/Gender: 29-Dec-1940 (75 y.o. Male) Treating RN: Montey Hora Primary Care Physician: [facility], Dossie Der Other Clinician: Referring Physician: [facility], Dossie Der Treating Physician/Extender: Frann Rider in Treatment: 15 Education Assessment Education Provided To: Patient Education Topics Provided Wound/Skin Impairment: Handouts: Other: wound care as ordered Methods: Demonstration, Explain/Verbal Responses: State content correctly Electronic Signature(s) Signed: 01/03/2016 4:43:32 PM By: Montey Hora Entered By: Montey Hora on 01/03/2016 11:46:54 Seth Mcintosh ([id number]) -------------------------------------------------------------------------------- Wound Assessment Details Patient Name: Seth Mcintosh Date of Service: 01/03/2016 11:00 AM Medical Record Number: [id number] Patient Account Number: [id number] Date of Birth/Sex: 09/14/1940 (75 y.o. Male) Treating RN: Montey Hora Primary Care Physician: [facility], Dossie Der Other Clinician: Referring Physician: Keith Rake Treating Physician/Extender: Frann Rider in Treatment: 15 Wound Status Wound Number: 1 Primary Diabetic Wound/Ulcer of the Lower Etiology: Extremity Wound Location: Right Lower Leg - Lateral Wound Open Wounding Event: Trauma Status: Date Acquired: 08/07/2015 Comorbid Glaucoma, Sleep Apnea, Coronary Weeks Of Treatment: 15 History: Artery Disease, Hypertension, Type II Clustered Wound: No Diabetes, Osteoarthritis Photos Wound Measurements Length: (cm) 1 Width: (cm) 0.3 Depth: (cm) 0.2 Area: (cm) 0.236 Volume: (cm)  0.047 % Reduction in Area: 94% % Reduction in Volume: 98.3% Epithelialization: None Tunneling: No Undermining: No Wound Description Classification: Grade 1 Wound Margin: Thickened Exudate Amount: Large Exudate Type: Serosanguineous Exudate Color: red, brown Foul Odor After Cleansing: No Wound Bed Granulation Amount: Large (67-100%) Exposed Structure Granulation Quality: Red Fascia Exposed: No Necrotic Amount: Small (1-33%) Fat Layer Exposed: No Necrotic Quality: Adherent Slough Tendon Exposed: No Muscle Exposed: No QWANELL, PELEGRIN ([id number]) Joint Exposed: No Bone Exposed: No Limited to Skin Breakdown Periwound Skin Texture Texture Color No Abnormalities Noted: No No Abnormalities Noted: No Callus: No Atrophie Blanche: No Crepitus: No Cyanosis: No Excoriation: No Ecchymosis: No Fluctuance: No Erythema: Yes Friable: No Hemosiderin Staining: No Induration: Yes Mottled: No Localized Edema: No Pallor: No Rash: No Rubor: No Scarring: No Temperature / Pain Moisture Temperature: No Abnormality No Abnormalities Noted: No Tenderness on Palpation: Yes Dry / Scaly: No Maceration: No Moist: No Wound Preparation Ulcer Cleansing: Rinsed/Irrigated with Saline Topical Anesthetic Applied: Other: lidocaine 4%, Treatment Notes Wound #1 (Right, Lateral Lower Leg) 1. Cleansed with: Clean wound with Normal Saline 2. Anesthetic Topical Lidocaine 4% cream to wound bed prior to debridement 3. Peri-wound Care: Skin Prep 4. Dressing Applied: Iodoform packing Gauze 5. Secondary [place] Signature(s) Signed: 01/03/2016 4:43:32 PM By: Montey Hora Entered By: Montey Hora on 01/03/2016 11:22:02 Seth Mcintosh ([id number]) -------------------------------------------------------------------------------- [place] Details Patient Name: Seth Mcintosh Date of Service: 01/03/2016 11:00 AM Medical Record Number: [id number] Patient  Account Number: [id number] Date of Birth/Sex: [birth year] (  75 y.o. Male) Treating RN: Montey Hora Primary Care Physician: [facility], [facility] Other Clinician: Referring Physician: [facility], [facility] Treating Physician/Extender: Frann Rider in Treatment: 15 Vital Signs Time Taken: 11:13 Temperature (F): 98.1 Height (in): 74 Pulse (bpm): 71 Weight (lbs): 265 Respiratory Rate (breaths/min): 18 Body Mass Index (BMI): 34 Blood Pressure (mmHg): 125/67 Reference Range: 80 - 120 mg / dl Electronic Signature(s) Signed: 01/03/2016 4:43:32 PM By: Montey Hora Entered By: Montey Hora on 01/03/2016 11:13:54

## 2016-01-17 ENCOUNTER — Ambulatory Visit: Payer: TRICARE For Life (TFL) | Admitting: Surgery

## 2016-01-24 ENCOUNTER — Encounter: Payer: Medicare Other | Attending: Nurse Practitioner | Admitting: Nurse Practitioner

## 2016-01-24 DIAGNOSIS — G4730 Sleep apnea, unspecified: Secondary | ICD-10-CM | POA: Diagnosis not present

## 2016-01-24 DIAGNOSIS — L97213 Non-pressure chronic ulcer of right calf with necrosis of muscle: Secondary | ICD-10-CM | POA: Diagnosis not present

## 2016-01-24 DIAGNOSIS — Z87891 Personal history of nicotine dependence: Secondary | ICD-10-CM | POA: Diagnosis not present

## 2016-01-24 DIAGNOSIS — K219 Gastro-esophageal reflux disease without esophagitis: Secondary | ICD-10-CM | POA: Diagnosis not present

## 2016-01-24 DIAGNOSIS — I10 Essential (primary) hypertension: Secondary | ICD-10-CM | POA: Diagnosis not present

## 2016-01-24 DIAGNOSIS — M1990 Unspecified osteoarthritis, unspecified site: Secondary | ICD-10-CM | POA: Diagnosis not present

## 2016-01-24 DIAGNOSIS — I2510 Atherosclerotic heart disease of native coronary artery without angina pectoris: Secondary | ICD-10-CM | POA: Insufficient documentation

## 2016-01-24 DIAGNOSIS — N400 Enlarged prostate without lower urinary tract symptoms: Secondary | ICD-10-CM | POA: Diagnosis not present

## 2016-01-24 DIAGNOSIS — M70861 Other soft tissue disorders related to use, overuse and pressure, right lower leg: Secondary | ICD-10-CM | POA: Insufficient documentation

## 2016-01-24 DIAGNOSIS — F329 Major depressive disorder, single episode, unspecified: Secondary | ICD-10-CM | POA: Diagnosis not present

## 2016-01-24 DIAGNOSIS — E11622 Type 2 diabetes mellitus with other skin ulcer: Secondary | ICD-10-CM | POA: Insufficient documentation

## 2016-01-24 DIAGNOSIS — L97811 Non-pressure chronic ulcer of other part of right lower leg limited to breakdown of skin: Secondary | ICD-10-CM | POA: Diagnosis not present

## 2016-01-25 NOTE — Progress Notes (Signed)
BRONXTON, ALDOUS ([id number]) Visit Report for 01/24/2016 Arrival Information Details Patient Name: Seth Mcintosh, Seth Mcintosh Date of Service: 01/24/2016 9:15 AM Medical Record Number: [id number] Patient Account Number: [id number] Date of Birth/Sex: 1940-10-11 (75 y.o. Male) Treating RN: Montey Hora Primary Care Physician: [facility], Dossie Der Other Clinician: Referring Physician: [facility], Dossie Der Treating Physician/Extender: Loistine Chance in Treatment: 18 Visit Information History Since Last Visit Added or deleted any medications: No Patient Arrived: Ambulatory Any new allergies or adverse reactions: No Arrival Time: 09:25 Had a fall or experienced change in No Accompanied By: self activities of daily living that may affect Transfer Assistance: None risk of falls: Patient Identification Verified: Yes Signs or symptoms of abuse/neglect since last No Secondary Verification Process Yes visito Completed: Hospitalized since last visit: No Patient Requires Transmission-Based No Pain Present Now: No Precautions: Patient Has Alerts: Yes Patient Alerts: DM II Electronic Signature(s) Signed: 01/24/2016 5:16:43 PM By: Montey Hora Entered By: Montey Hora on 01/24/2016 09:28:08 Seth Mcintosh ([id number]) -------------------------------------------------------------------------------- Clinic Level of Care Assessment Details Patient Name: Seth Mcintosh Date of Service: 01/24/2016 9:15 AM Medical Record Number: [id number] Patient Account Number: [id number] Date of Birth/Sex: 1940-11-05 (75 y.o. Male) Treating RN: Montey Hora Primary Care Physician: [facility], Dossie Der Other Clinician: Referring Physician: [facility], Dossie Der Treating Physician/Extender: Loistine Chance in Treatment: 18 Clinic Level of Care Assessment Items TOOL 4 Quantity Score []  - Use when only an EandM is performed on FOLLOW-UP visit 0 ASSESSMENTS - Nursing Assessment / Reassessment X - Reassessment of Co-morbidities  (includes updates in patient status) 1 10 X - Reassessment of Adherence to Treatment Plan 1 5 ASSESSMENTS - Wound and Skin Assessment / Reassessment X - Simple Wound Assessment / Reassessment - one wound 1 5 []  - Complex Wound Assessment / Reassessment - multiple wounds 0 []  - Dermatologic / Skin Assessment (not related to wound area) 0 ASSESSMENTS - Focused Assessment []  - Circumferential Edema Measurements - multi extremities 0 []  - Nutritional Assessment / Counseling / Intervention 0 X - Lower Extremity Assessment (monofilament, tuning fork, pulses) 1 5 []  - Peripheral Arterial Disease Assessment (using hand held doppler) 0 ASSESSMENTS - Ostomy and/or Continence Assessment and Care []  - Incontinence Assessment and Management 0 []  - Ostomy Care Assessment and Management (repouching, etc.) 0 PROCESS - Coordination of Care X - Simple Patient / Family Education for ongoing care 1 15 []  - Complex (extensive) Patient / Family Education for ongoing care 0 []  - Staff obtains Programmer, systems, Records, Test Results / Process Orders 0 []  - Staff telephones HHA, Nursing Homes / Clarify orders / etc 0 []  - Routine Transfer to another Facility (non-emergent condition) 0 Seth Mcintosh, Seth Mcintosh ([id number]) []  - Routine Hospital Admission (non-emergent condition) 0 []  - New Admissions / Biomedical engineer / Ordering NPWT, Apligraf, etc. 0 []  - Emergency Hospital Admission (emergent condition) 0 X - Simple Discharge Coordination 1 10 []  - Complex (extensive) Discharge Coordination 0 PROCESS - Special Needs []  - Pediatric / Minor Patient Management 0 []  - Isolation Patient Management 0 []  - Hearing / Language / Visual special needs 0 []  - Assessment of Community assistance (transportation, D/C planning, etc.) 0 []  - Additional assistance / Altered mentation 0 []  - Support Surface(s) Assessment (bed, cushion, seat, etc.) 0 INTERVENTIONS - Wound Cleansing / Measurement X - Simple Wound Cleansing - one  wound 1 5 []  - Complex Wound Cleansing - multiple wounds 0 X - Wound Imaging (photographs - any number of wounds) 1 5 []  - Wound Tracing (instead  of photographs) 0 X - Simple Wound Measurement - one wound 1 5 []  - Complex Wound Measurement - multiple wounds 0 INTERVENTIONS - Wound Dressings X - Small Wound Dressing one or multiple wounds 1 10 []  - Medium Wound Dressing one or multiple wounds 0 []  - Large Wound Dressing one or multiple wounds 0 []  - Application of Medications - topical 0 []  - Application of Medications - injection 0 INTERVENTIONS - Miscellaneous []  - External ear exam 0 Seth Mcintosh, Seth Mcintosh ([id number]) []  - Specimen Collection (cultures, biopsies, blood, body fluids, etc.) 0 []  - Specimen(s) / Culture(s) sent or taken to Lab for analysis 0 []  - Patient Transfer (multiple staff / Harrel Lemon Lift / Similar devices) 0 []  - Simple Staple / Suture removal (25 or less) 0 []  - Complex Staple / Suture removal (26 or more) 0 []  - Hypo / Hyperglycemic Management (close monitor of Blood Glucose) 0 []  - Ankle / Brachial Index (ABI) - do not check if billed separately 0 X - Vital Signs 1 5 Has the patient been seen at the hospital within the last three years: Yes Total Score: 80 Level Of Care: New/Established - Level 3 Electronic Signature(s) Signed: 01/24/2016 5:16:43 PM By: Montey Hora Entered By: Montey Hora on 01/24/2016 09:48:16 Seth Mcintosh ([id number]) -------------------------------------------------------------------------------- Encounter Discharge Information Details Patient Name: Seth Mcintosh Date of Service: 01/24/2016 9:15 AM Medical Record Number: [id number] Patient Account Number: [id number] Date of Birth/Sex: 09-24-1940 (75 y.o. Male) Treating RN: Montey Hora Primary Care Physician: [facility], Dossie Der Other Clinician: Referring Physician: [facility], Dossie Der Treating Physician/Extender: Loistine Chance in Treatment: 18 Encounter Discharge Information  Items Discharge Pain Level: 0 Discharge Condition: Stable Ambulatory Status: Ambulatory Discharge Destination: Home Transportation: Private Auto Accompanied By: self Schedule Follow-up Appointment: Yes Medication Reconciliation completed and provided to Patient/Care No Seth Mcintosh: Provided on Clinical Summary of Care: 01/24/2016 Form Type Recipient Paper Patient JK Electronic Signature(s) Signed: 01/24/2016 9:56:36 AM By: Montey Hora Previous Signature: 01/24/2016 9:51:21 AM Version By: Ruthine Dose Entered By: Montey Hora on 01/24/2016 09:56:35 Seth Mcintosh ([id number]) -------------------------------------------------------------------------------- Multi Wound Chart Details Patient Name: Seth Mcintosh Date of Service: 01/24/2016 9:15 AM Medical Record Number: [id number] Patient Account Number: [id number] Date of Birth/Sex: 27-May-1940 (75 y.o. Male) Treating RN: Montey Hora Primary Care Physician: [facility], Dossie Der Other Clinician: Referring Physician: [facility], Dossie Der Treating Physician/Extender: Loistine Chance in Treatment: 18 Vital Signs Height(in): 74 Pulse(bpm): 60 Weight(lbs): 265 Blood Pressure 136/58 (mmHg): Body Mass Index(BMI): 34 Temperature(F): 98.3 Respiratory Rate 18 (breaths/min): Photos: [1:No Photos] [N/A:N/A] Wound Location: [1:Right Lower Leg - Lateral] [N/A:N/A] Wounding Event: [1:Trauma] [N/A:N/A] Primary Etiology: [1:Diabetic Wound/Ulcer of the Lower Extremity] [N/A:N/A] Comorbid History: [1:Glaucoma, Sleep Apnea, Coronary Artery Disease, Hypertension, Type II Diabetes, Osteoarthritis] [N/A:N/A] Date Acquired: [1:08/07/2015] [N/A:N/A] Weeks of Treatment: [1:18] [N/A:N/A] Wound Status: [1:Open] [N/A:N/A] Measurements L x W x D 0.7x0.2x0.2 [N/A:N/A] (cm) Area (cm) : [1:0.11] [N/A:N/A] Volume (cm) : [1:0.022] [N/A:N/A] % Reduction in Area: [1:97.20%] [N/A:N/A] % Reduction in Volume: 99.20% [N/A:N/A] Position 1 (o'clock):  6 Maximum Distance 1 1 (cm): Tunneling: [1:Yes] [N/A:N/A] Classification: [1:Grade 1] [N/A:N/A] Exudate Amount: [1:Large] [N/A:N/A] Exudate Type: [1:Serosanguineous] [N/A:N/A] Exudate Color: [1:red, brown] [N/A:N/A] Wound Margin: [1:Thickened] [N/A:N/A] Granulation Amount: [1:Large (67-100%)] [N/A:N/A] Granulation Quality: [1:Red] [N/A:N/A] Necrotic Amount: [1:Small (1-33%)] [N/A:N/A] Exposed Structures: Fascia: No N/A N/A Fat: No Tendon: No Muscle: No Joint: No Bone: No Limited to Skin Breakdown Epithelialization: None N/A N/A Periwound Skin Texture: Induration: Yes N/A N/A Edema: No Excoriation: No Callus: No Crepitus:  No Fluctuance: No Friable: No Rash: No Scarring: No Periwound Skin Maceration: No N/A N/A Moisture: Moist: No Dry/Scaly: No Periwound Skin Color: Erythema: Yes N/A N/A Atrophie Blanche: No Cyanosis: No Ecchymosis: No Hemosiderin Staining: No Mottled: No Pallor: No Rubor: No Temperature: No Abnormality N/A N/A Tenderness on Yes N/A N/A Palpation: Wound Preparation: Ulcer Cleansing: N/A N/A Rinsed/Irrigated with Saline Topical Anesthetic Applied: Other: lidocaine 4% Treatment Notes Electronic Signature(s) Signed: 01/24/2016 5:16:43 PM By: Montey Hora Entered By: Montey Hora on 01/24/2016 09:40:53 Seth Mcintosh ([id number]) -------------------------------------------------------------------------------- Multi-Disciplinary Care Plan Details Patient Name: Seth Mcintosh Date of Service: 01/24/2016 9:15 AM Medical Record Number: [id number] Patient Account Number: [id number] Date of Birth/Sex: 1940/11/10 (75 y.o. Male) Treating RN: Montey Hora Primary Care Physician: [facility], Dossie Der Other Clinician: Referring Physician: [facility], Dossie Der Treating Physician/Extender: Loistine Chance in Treatment: 18 Active Inactive Abuse / Safety / Falls / Self Care Management Nursing Diagnoses: Potential for falls Goals: Patient will remain  injury free Date Initiated: 09/16/2015 Goal Status: Active Interventions: Assess fall risk on admission and as needed Notes: Orientation to the Wound Care Program Nursing Diagnoses: Knowledge deficit related to the wound healing center program Goals: Patient/caregiver will verbalize understanding of the [place] Program Date Initiated: 09/16/2015 Goal Status: Active Interventions: Provide education on orientation to the wound center Notes: Pain, Acute or Chronic Nursing Diagnoses: Pain, acute or chronic: actual or potential Potential alteration in comfort, pain Goals: Seth Mcintosh, Seth Mcintosh ([id number]) Patient will verbalize adequate pain control and receive pain control interventions during procedures as needed Date Initiated: 09/16/2015 Goal Status: Active Interventions: Assess comfort goal upon admission Complete pain assessment as per visit requirements Notes: Soft Tissue Infection Nursing Diagnoses: Impaired tissue integrity Goals: Patient will remain free of wound infection Date Initiated: 09/16/2015 Goal Status: Active Interventions: Assess signs and symptoms of infection every visit Notes: Wound/Skin Impairment Nursing Diagnoses: Impaired tissue integrity Goals: Ulcer/skin breakdown will have a volume reduction of 30% by week 4 Date Initiated: 09/16/2015 Goal Status: Active Ulcer/skin breakdown will have a volume reduction of 50% by week 8 Date Initiated: 09/16/2015 Goal Status: Active Ulcer/skin breakdown will have a volume reduction of 80% by week 12 Date Initiated: 09/16/2015 Goal Status: Active Interventions: Assess patient/caregiver ability to obtain necessary supplies Assess ulceration(s) every visit Notes: Seth Mcintosh, Seth Mcintosh ([id number]) Electronic Signature(s) Signed: 01/24/2016 5:16:43 PM By: Montey Hora Entered By: Montey Hora on 01/24/2016 09:40:43 Seth Mcintosh  ([id number]) -------------------------------------------------------------------------------- Pain Assessment Details Patient Name: Seth Mcintosh Date of Service: 01/24/2016 9:15 AM Medical Record Number: [id number] Patient Account Number: [id number] Date of Birth/Sex: 1940/12/04 (75 y.o. Male) Treating RN: Montey Hora Primary Care Physician: [facility], Dossie Der Other Clinician: Referring Physician: [facility], Dossie Der Treating Physician/Extender: Loistine Chance in Treatment: 18 Active Problems Location of Pain Severity and Description of Pain Patient Has Paino No Site Locations Pain Management and Medication Current Pain Management: Notes Topical or injectable lidocaine is offered to patient for acute pain when surgical debridement is performed. If needed, Patient is instructed to use over the counter pain medication for the following 24-48 hours after debridement. Wound care MDs do not prescribed pain medications. Patient has chronic pain or uncontrolled pain. Patient has been instructed to make an appointment with their Primary Care Physician for pain management. Electronic Signature(s) Signed: 01/24/2016 5:16:43 PM By: Montey Hora Entered By: Montey Hora on 01/24/2016 09:28:16 Seth Mcintosh ([id number]) -------------------------------------------------------------------------------- Patient/Caregiver Education Details Patient Name: Seth Mcintosh Date of Service: 01/24/2016 9:15 AM Medical Record Number: [id number]  Patient Account Number: [id number] Date of Birth/Gender: 1940-12-30 (75 y.o. Male) Treating RN: Montey Hora Primary Care Physician: [facility], Dossie Der Other Clinician: Referring Physician: [facility], Dossie Der Treating Physician/Extender: Loistine Chance in Treatment: 18 Education Assessment Education Provided To: Patient Education Topics Provided Wound/Skin Impairment: Handouts: Other: wound care to continue as ordered Methods: Demonstration,  Explain/Verbal Responses: State content correctly Electronic Signature(s) Signed: 01/24/2016 5:16:43 PM By: Montey Hora Entered By: Montey Hora on 01/24/2016 09:56:51 Seth Mcintosh ([id number]) -------------------------------------------------------------------------------- Wound Assessment Details Patient Name: Seth Mcintosh Date of Service: 01/24/2016 9:15 AM Medical Record Number: [id number] Patient Account Number: [id number] Date of Birth/Sex: Apr 17, 1941 (75 y.o. Male) Treating RN: Montey Hora Primary Care Physician: [facility], Dossie Der Other Clinician: Referring Physician: [facility], Dossie Der Treating Physician/Extender: Loistine Chance in Treatment: 18 Wound Status Wound Number: 1 Primary Diabetic Wound/Ulcer of the Lower Etiology: Extremity Wound Location: Right Lower Leg - Lateral Wound Open Wounding Event: Trauma Status: Date Acquired: 08/07/2015 Comorbid Glaucoma, Sleep Apnea, Coronary Weeks Of Treatment: 18 History: Artery Disease, Hypertension, Type II Clustered Wound: No Diabetes, Osteoarthritis Photos Wound Measurements Length: (cm) 0.7 Width: (cm) 0.2 Depth: (cm) 0.2 Area: (cm) 0.11 Volume: (cm) 0.022 % Reduction in Area: 97.2% % Reduction in Volume: 99.2% Epithelialization: None Tunneling: Yes Position (o'clock): 6 Maximum Distance: (cm) 1 Undermining: No Wound Description Classification: Grade 1 Foul Odor Afte Wound Margin: Thickened Exudate Amount: Large Exudate Type: Serosanguineous Exudate Color: red, brown r Cleansing: No Wound Bed Granulation Amount: Large (67-100%) Exposed Structure Granulation Quality: Red Fascia Exposed: No Seth Mcintosh, Seth Mcintosh ([id number]) Necrotic Amount: Small (1-33%) Fat Layer Exposed: No Necrotic Quality: Adherent Slough Tendon Exposed: No Muscle Exposed: No Joint Exposed: No Bone Exposed: No Limited to Skin Breakdown Periwound Skin Texture Texture Color No Abnormalities Noted: No No Abnormalities  Noted: No Callus: No Atrophie Blanche: No Crepitus: No Cyanosis: No Excoriation: No Ecchymosis: No Fluctuance: No Erythema: Yes Friable: No Hemosiderin Staining: No Induration: Yes Mottled: No Localized Edema: No Pallor: No Rash: No Rubor: No Scarring: No Temperature / Pain Moisture Temperature: No Abnormality No Abnormalities Noted: No Tenderness on Palpation: Yes Dry / Scaly: No Maceration: No Moist: No Wound Preparation Ulcer Cleansing: Rinsed/Irrigated with Saline Topical Anesthetic Applied: Other: lidocaine 4%, Treatment Notes Wound #1 (Right, Lateral Lower Leg) 1. Cleansed with: Clean wound with Normal Saline 2. Anesthetic Topical Lidocaine 4% cream to wound bed prior to debridement 4. Dressing Applied: Iodoform packing Gauze 5. Secondary [place] 7. Secured with Patient to wear own compression stockings Electronic Signature(s) Signed: 01/24/2016 5:16:43 PM By: Montey Hora Entered By: Montey Hora on 01/24/2016 11:31:36 Seth Mcintosh ([id number]) Seth Mcintosh, Seth Mcintosh ([id number]) -------------------------------------------------------------------------------- [place] Details Patient Name: Seth Mcintosh Date of Service: 01/24/2016 9:15 AM Medical Record Number: [id number] Patient Account Number: [id number] Date of Birth/Sex: Oct 12, 1940 (75 y.o. Male) Treating RN: Montey Hora Primary Care Physician: [facility], Dossie Der Other Clinician: Referring Physician: [facility], Dossie Der Treating Physician/Extender: Loistine Chance in Treatment: 18 Vital Signs Time Taken: 09:28 Temperature (F): 98.3 Height (in): 74 Pulse (bpm): 60 Weight (lbs): 265 Respiratory Rate (breaths/min): 18 Body Mass Index (BMI): 34 Blood Pressure (mmHg): 136/58 Reference Range: 80 - 120 mg / dl Electronic Signature(s) Signed: 01/24/2016 5:16:43 PM By: Montey Hora Entered By: Montey Hora on 01/24/2016 [id number]

## 2016-01-25 NOTE — Progress Notes (Addendum)
SKYE, MCCLAMMY ([id number]) Visit Report for 01/24/2016 Chief Complaint Document Details Patient Name: Seth Mcintosh, Seth Mcintosh Date of Service: 01/24/2016 9:15 AM Medical Record Number: [id number] Patient Account Number: [id number] Date of Birth/Sex: 06-Dec-1940 (75 y.o. Male) Treating RN: Montey Hora Primary Care Physician: [facility], Dossie Der Other Clinician: Referring Physician: [facility], Dossie Der Treating Physician/Extender: Loistine Chance in Treatment: 18 Information Obtained from: Patient Chief Complaint Patient seen for complaints of Non-Healing Wound which she's had on the right lower extremity for about 5 weeks Electronic Signature(s) Signed: 01/24/2016 4:03:42 PM By: Londell Moh FNP Entered By: Londell Moh on 01/24/2016 09:43:01 Seth Mcintosh ([id number]) -------------------------------------------------------------------------------- HPI Details Patient Name: Seth Mcintosh Date of Service: 01/24/2016 9:15 AM Medical Record Number: [id number] Patient Account Number: [id number] Date of Birth/Sex: Dec 02, 1940 (75 y.o. Male) Treating RN: Montey Hora Primary Care Physician: [facility], Dossie Der Other Clinician: Referring Physician: [facility], Dossie Der Treating Physician/Extender: Loistine Chance in Treatment: 18 History of Present Illness Location: open wound right lower extremity lateral leg Quality: Patient reports experiencing a dull pain to affected area(s). Severity: Patient states wound are getting worse. Duration: Patient has had the wound for > 6 weeks prior to seeking treatment at the wound center Timing: Pain in wound is Intermittent (comes and goes Context: The wound occurred when the patient had a injury with a blunt object around 08/07/2015 Modifying Factors: a large swelling with drainage Associated Signs and Symptoms: Patient reports having increase swelling. HPI Description: 75 year old gentleman with known history of diabetes mellitus without complication had  a lacerated wound to his right lower extremity which was repaired about 4 weeks ago. Was seen by his PCP Dr. Donneta Romberg with a open wound on the right lateral leg with some discharge and erythema and he was placed on a blocks and local dressing was applied and referred to [place] for further care. Past medical history significant for diabetes mellitus type 2, coronary artery disease, hypertension, arthritis, depression, foot surgery o2, back surgery, CABG, TURP, right total knee replacement. He is a former smoker and quit in 1994. On questioning the patient he gives a history that he had a blunt injury to his right leg and this happened on April 15 of this year and about 2 weeks later he had blood draining from here and had to go to the ER and have sutures placed. However on looking at his medical records he was in the ER on 05/17/2015 with a blunt injury on his right lower extremity and at that stage x-ray showed no fracture and the right lower leg ultrasound showed no evidence of DVT. Diagnosis of a hematoma was made and he was given symptomatic treatment for this. The next time he was seen in the emergency room was on 08/11/2015 when the patient had a 10 cm laceration to the right lateral leg and this was sutured by the ER physician Dr. Dahlia Client. His blood alcohol level was 162 mg percent that day and the sutures were to be removed in 10 days' time. 09/23/2015 -- had his ultrasound of the left lower extremity for possible hematoma and the impression was there was no discrete drainable hematoma in the area of clinical concern on the left lower extremity. On reviewing his history he clearly states that he never had a large lacerated wound due to the injury or fall on April 19. He says he did have a few drinks but has not drunk and the hematoma which he had for a couple of months just burst open and caused the  big bleed and extrusion of clots. 01/24/16: pt returns today for ongoing evaluation and  management of a chronic right lower leg wound. Not seen here since 01/03/16. "Vacation in [place] with my daughter and granddaughter." he denies systemic s/s of infection. no new wounds or skin breakdown. wound is improving. Electronic Signature(s) Signed: 01/24/2016 4:03:42 PM By: Londell Moh FNP Seth Mcintosh ([id number]) Entered By: Londell Moh on 01/24/2016 09:44:21 Seth Mcintosh ([id number]) -------------------------------------------------------------------------------- Physical Exam Details Patient Name: Seth Mcintosh Date of Service: 01/24/2016 9:15 AM Medical Record Number: [id number] Patient Account Number: [id number] Date of Birth/Sex: 1940/06/16 (75 y.o. Male) Treating RN: Montey Hora Primary Care Physician: [facility], Dossie Der Other Clinician: Referring Physician: [facility], Dossie Der Treating Physician/Extender: Loistine Chance in Treatment: 18 Constitutional Patient's appearance is neat and clean. Appears in no acute distress. Well nourished and well developed.. Ears, Nose, Mouth, and Throat Patient can hear normal speaking tones without difficulty.Marland Kitchen Respiratory Respiratory effort is easy and symmetric bilaterally. Rate is normal at rest and on room air.. Cardiovascular Extremities are free of varicosities, clubbing or edema. Peripheral pulses strong and equal. Capillary refill < 3 seconds.. Psychiatric Judgement and insight intact.. Alert and oriented times 3.. Short and long term memory intact.. No evidence of depression, anxiety, or agitation. Calm, cooperative, and communicative. Appropriate interactions and affect.. Electronic Signature(s) Signed: 01/24/2016 4:03:42 PM By: Londell Moh FNP Entered By: Londell Moh on 01/24/2016 09:45:03 Seth Mcintosh ([id number]) -------------------------------------------------------------------------------- Physician Orders Details Patient Name: Seth Mcintosh Date of Service: 01/24/2016 9:15 AM Medical  Record Number: [id number] Patient Account Number: [id number] Date of Birth/Sex: Nov 02, 1940 (75 y.o. Male) Treating RN: Montey Hora Primary Care Physician: [facility], Dossie Der Other Clinician: Referring Physician: [facility], Dossie Der Treating Physician/Extender: Loistine Chance in Treatment: 43 Verbal / Phone Orders: Yes Clinician: Montey Hora Read Back and Verified: Yes Diagnosis Coding ICD-10 Coding Code Description E11.622 Type 2 diabetes mellitus with other skin ulcer L97.213 Non-pressure chronic ulcer of right calf with necrosis of muscle M70.861 Other soft tissue disorders related to use, overuse and pressure, right lower leg Wound Cleansing Wound #1 Right,Lateral Lower Leg o Cleanse wound with mild soap and water o May Shower, gently pat wound dry prior to applying new dressing. Anesthetic Wound #1 Right,Lateral Lower Leg o Topical Lidocaine 4% cream applied to wound bed prior to debridement Skin Barriers/Peri-Wound Care Wound #1 Right,Lateral Lower Leg o Skin Prep Primary Wound Dressing Wound #1 Right,Lateral Lower Leg o Iodoform packing Gauze Secondary Dressing Wound #1 Right,Lateral Lower Leg o Non-adherent pad - telfa island dressing Dressing Change Frequency Wound #1 Right,Lateral Lower Leg o Change dressing every day. Edema Control Wound #1 Right,Lateral Lower Leg o Elevate legs to the level of the heart and pump ankles as often as possible BRUCE, WINDMILLER. ([id number]) o Support Garment 20-30 mm/Hg pressure to: - wear compression stockings Additional Orders / Instructions Wound #1 Right,Lateral Lower Leg o Increase protein intake. Electronic Signature(s) Signed: 01/24/2016 4:03:42 PM By: Londell Moh FNP Signed: 01/24/2016 5:16:43 PM By: Montey Hora Entered By: Montey Hora on 01/24/2016 09:47:45 Seth Mcintosh ([id number]) -------------------------------------------------------------------------------- Problem List Details Patient  Name: Seth Mcintosh Date of Service: 01/24/2016 9:15 AM Medical Record Number: [id number] Patient Account Number: [id number] Date of Birth/Sex: [birth year] (75 y.o. 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 with necrosis of 09/16/2015 Yes muscle M70.861 Other soft tissue disorders related to use, overuse and 09/16/2015 Yes pressure, right lower leg Inactive Problems Resolved Problems Electronic Signature(s) Signed: 01/24/2016 4:03:42 PM By: Londell Moh FNP Entered By: Londell Moh on 01/24/2016 09:42:52 Seth Mcintosh ([id number]) -------------------------------------------------------------------------------- Progress Note Details Patient Name: Seth Mcintosh Date of Service: 01/24/2016 9:15 AM Medical Record Number: [id number] Patient Account Number: [id number] Date of Birth/Sex: 03-26-1941 (75 y.o. Male) Treating RN: Montey Hora Primary Care Physician: [facility], Dossie Der Other Clinician: Referring Physician: [facility], Dossie Der Treating Physician/Extender: Loistine Chance in Treatment: 18 Subjective Chief Complaint Information obtained from Patient Patient seen for complaints of Non-Healing Wound which she's had on the right lower extremity for about 5 weeks History of Present Illness (HPI) The following HPI elements were documented for the patient's wound: Location: open wound right lower extremity lateral leg Quality: Patient reports experiencing a dull pain to affected area(s). Severity: Patient states wound are getting worse. Duration: Patient has had the wound for > 6 weeks prior to seeking treatment at the wound center Timing:  Pain in wound is Intermittent (comes and goes Context: The wound occurred when the patient had a injury with a blunt object around 08/07/2015 Modifying Factors: a large swelling with drainage Associated Signs and Symptoms: Patient reports having increase swelling. 75 year old gentleman with known history of diabetes mellitus without complication had a lacerated wound to his right lower extremity which was repaired about 4 weeks ago. Was seen by his PCP Dr. Donneta Romberg with a open wound on the right lateral leg with some discharge and erythema and he was placed on a blocks and local dressing was applied and referred to [place] for further care. Past medical history significant for diabetes mellitus type 2, coronary artery disease, hypertension, arthritis, depression, foot surgery o2, back surgery, CABG, TURP, right total knee replacement. He is a former smoker and quit in 1994. On questioning the patient he gives a history that he had a blunt injury to his right leg and this happened on April 15 of this year and about 2 weeks later he had blood draining from here and had to go to the ER and have sutures placed. However on looking at his medical records he was in the ER on 05/17/2015 with a blunt injury on his right lower extremity and at that stage x-ray showed no fracture and the right lower leg ultrasound showed no evidence of DVT. Diagnosis of a hematoma was made and he was given symptomatic treatment for this. The next time he was seen in the emergency room was on 08/11/2015 when the patient had a 10 cm laceration to the right lateral leg and this was sutured by the ER physician Dr. Dahlia Client. His blood alcohol level was 162 mg percent that day and the sutures were to be removed in 10 days' time. 09/23/2015 -- had his ultrasound of the left lower extremity for possible hematoma and the impression was there was no discrete drainable hematoma in the area of clinical concern on the left lower  extremity. On reviewing his history he clearly states that he never had a large lacerated wound due to the injury or fall on April 19. He says he did have a few drinks but has not drunk and the hematoma which he had for a LORENTZ, TAINTER. ([id number]) couple of months just burst open and caused the big bleed and extrusion of clots. 01/24/16: pt returns today  for ongoing evaluation and management of a chronic right lower leg wound. Not seen here since 01/03/16. "Vacation in [place] with my daughter and granddaughter." he denies systemic s/s of infection. no new wounds or skin breakdown. wound is improving. Objective Constitutional Patient's appearance is neat and clean. Appears in no acute distress. Well nourished and well developed.. Vitals Time Taken: 9:28 AM, Height: 74 in, Weight: 265 lbs, BMI: 34, Temperature: 98.3 F, Pulse: 60 bpm, Respiratory Rate: 18 breaths/min, Blood Pressure: 136/58 mmHg. Ears, Nose, Mouth, and Throat Patient can hear normal speaking tones without difficulty.Marland Kitchen Respiratory Respiratory effort is easy and symmetric bilaterally. Rate is normal at rest and on room air.. Cardiovascular Extremities are free of varicosities, clubbing or edema. Peripheral pulses strong and equal. Capillary refill < 3 seconds.. Psychiatric Judgement and insight intact.. Alert and oriented times 3.. Short and long term memory intact.. No evidence of depression, anxiety, or agitation. Calm, cooperative, and communicative. Appropriate interactions and affect.. Integumentary (Hair, Skin) Wound #1 status is Open. Original cause of wound was Trauma. The wound is located on the Right,Lateral Lower Leg. The wound measures 0.7cm length x 0.2cm width x 0.2cm depth; 0.11cm^2 area and 0.022cm^3 volume. The wound is limited to skin breakdown. There is no undermining noted, however, there is tunneling at 6:00 with a maximum distance of 1cm. There is a large amount of serosanguineous drainage noted.  The wound margin is thickened. There is large (67-100%) red granulation within the wound bed. There is a small (1-33%) amount of necrotic tissue within the wound bed including Adherent Slough. The periwound skin appearance exhibited: Induration, Erythema. The periwound skin appearance did not exhibit: Callus, Crepitus, Excoriation, Fluctuance, Friable, Localized Edema, Rash, Scarring, Dry/Scaly, Maceration, Moist, Atrophie Blanche, Cyanosis, Ecchymosis, Hemosiderin Staining, Mottled, Pallor, Rubor. The surrounding wound skin color is noted with erythema. Periwound temperature was noted as No Abnormality. The periwound has tenderness on palpation. 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 Anesthetic: Wound #1 Right,Lateral Lower Leg: Topical Lidocaine 4% cream applied to wound bed prior to debridement Skin Barriers/Peri-Wound Care: Wound #1 Right,Lateral Lower Leg: Skin Prep Primary Wound Dressing: Wound #1 Right,Lateral Lower Leg: Iodoform packing Gauze Secondary Dressing: Wound #1 Right,Lateral Lower Leg: Non-adherent pad - telfa island dressing Dressing Change Frequency: Wound #1 Right,Lateral Lower Leg: Change dressing every day. Edema Control: Wound #1 Right,Lateral Lower Leg: Elevate legs to the level of the heart and pump ankles as often  as possible Support Garment 20-30 mm/Hg pressure to: - wear compression stockings Additional Orders / Instructions: Wound #1 Right,Lateral Lower Leg: Increase protein intake. ELIZEO, KAHLER ([id number]) Follow-Up Appointments: A follow-up appointment should be scheduled. A Patient Clinical Summary of Care was provided to JK 1. discussed clinical findings and implications with pt. all questions were answered. Electronic Signature(s) Signed: 02/16/2016 4:13:02 PM By: Londell Moh FNP Previous Signature: 01/24/2016 4:03:42 PM Version By: Londell Moh FNP Entered By: Londell Moh on  02/16/2016 16:03:22 RALPHEAL, SURGENER ([id number]) -------------------------------------------------------------------------------- [place] Details Patient Name: Seth Mcintosh Date of Service: 01/24/2016 Medical Record Number: [id number] Patient Account Number: [id number] Date of Birth/Sex: November 10, 1940 (75 y.o. Male) Treating RN: Montey Hora Primary Care Physician: [facility], Dossie Der Other Clinician: Referring Physician: [facility], Dossie Der Treating Physician/Extender: Loistine Chance in Treatment: 18 Diagnosis Coding ICD-10 Codes Code Description E11.622 Type 2 diabetes mellitus with other skin ulcer L97.213 Non-pressure chronic ulcer of right calf with necrosis of muscle M70.861 Other soft tissue disorders related to use, overuse and pressure, right lower leg Facility Procedures CPT4 Code: [id number] Description: 99213 - WOUND CARE VISIT-LEV 3 EST PT Modifier: Quantity: 1 Physician Procedures CPT4 Code Description: [id number] - WC PHYS LEVEL 2 - EST PT ICD-10 Description Diagnosis E11.622 Type 2 diabetes mellitus with other skin ulcer L97.213 Non-pressure chronic ulcer of right calf with necr Modifier: osis of muscle Quantity: 1 Electronic Signature(s) Signed: 01/24/2016 4:03:42 PM By: Londell Moh FNP Entered By: Londell Moh on 01/24/2016 10:06:28

## 2016-02-02 MED ORDER — ARMC OPHTHALMIC DILATING DROPS: 1.0000 "application " | OPHTHALMIC | Status: DC | PRN

## 2016-02-03 ENCOUNTER — Ambulatory Visit: Payer: Medicare Other | Admitting: Certified Registered Nurse Anesthetist

## 2016-02-03 ENCOUNTER — Encounter
Admission: RE | Disposition: A | Discharge status: Home / Self Care | Payer: Self-pay | Source: Ambulatory Visit | Attending: Ophthalmology

## 2016-02-03 ENCOUNTER — Ambulatory Visit
Admission: RE | Admit: 2016-02-03 | Discharge: 2016-02-03 | Disposition: A | Discharge status: Home / Self Care | Payer: Medicare Other | Source: Ambulatory Visit | Attending: Ophthalmology | Admitting: Ophthalmology

## 2016-02-03 DIAGNOSIS — G4730 Sleep apnea, unspecified: Secondary | ICD-10-CM | POA: Diagnosis not present

## 2016-02-03 DIAGNOSIS — K5730 Diverticulosis of large intestine without perforation or abscess without bleeding: Secondary | ICD-10-CM | POA: Insufficient documentation

## 2016-02-03 DIAGNOSIS — Z87891 Personal history of nicotine dependence: Secondary | ICD-10-CM | POA: Insufficient documentation

## 2016-02-03 DIAGNOSIS — K219 Gastro-esophageal reflux disease without esophagitis: Secondary | ICD-10-CM | POA: Diagnosis not present

## 2016-02-03 DIAGNOSIS — I10 Essential (primary) hypertension: Secondary | ICD-10-CM | POA: Diagnosis not present

## 2016-02-03 DIAGNOSIS — M1990 Unspecified osteoarthritis, unspecified site: Secondary | ICD-10-CM | POA: Diagnosis not present

## 2016-02-03 DIAGNOSIS — Z6833 Body mass index (BMI) 33.0-33.9, adult: Secondary | ICD-10-CM | POA: Insufficient documentation

## 2016-02-03 DIAGNOSIS — F329 Major depressive disorder, single episode, unspecified: Secondary | ICD-10-CM | POA: Insufficient documentation

## 2016-02-03 DIAGNOSIS — H2512 Age-related nuclear cataract, left eye: Secondary | ICD-10-CM | POA: Diagnosis not present

## 2016-02-03 DIAGNOSIS — E119 Type 2 diabetes mellitus without complications: Secondary | ICD-10-CM | POA: Insufficient documentation

## 2016-02-03 DIAGNOSIS — Z79899 Other long term (current) drug therapy: Secondary | ICD-10-CM | POA: Diagnosis not present

## 2016-02-03 DIAGNOSIS — I2510 Atherosclerotic heart disease of native coronary artery without angina pectoris: Secondary | ICD-10-CM | POA: Diagnosis not present

## 2016-02-03 DIAGNOSIS — Z7984 Long term (current) use of oral hypoglycemic drugs: Secondary | ICD-10-CM | POA: Insufficient documentation

## 2016-02-03 HISTORY — PX: CATARACT EXTRACTION W/PHACO: SHX586

## 2016-02-03 HISTORY — DX: Diverticulosis of intestine, part unspecified, without perforation or abscess without bleeding: K57.90

## 2016-02-03 LAB — GLUCOSE, CAPILLARY: Glucose-Capillary: 159 mg/dL — ABNORMAL HIGH (ref 65–99)

## 2016-02-03 SURGERY — PHACOEMULSIFICATION, CATARACT, WITH IOL INSERTION
Anesthesia: Monitor Anesthesia Care | Site: Eye | Laterality: Left | Wound class: Clean

## 2016-02-03 MED ORDER — SODIUM HYALURONATE 23 MG/ML IO SOLN
INTRAOCULAR | Status: AC
  Filled 2016-02-03: qty 0.6

## 2016-02-03 MED ORDER — TETRACAINE HCL 0.5 % OP SOLN
OPHTHALMIC | Status: AC
  Filled 2016-02-03: qty 2

## 2016-02-03 MED ORDER — ARMC OPHTHALMIC DILATING DROPS
1.0000 "application " | OPHTHALMIC | Status: DC
  Administered 2016-02-03 (×3): 1 via OPHTHALMIC

## 2016-02-03 MED ORDER — EPINEPHRINE PF 1 MG/ML IJ SOLN
INTRAMUSCULAR | Status: AC
  Filled 2016-02-03: qty 2

## 2016-02-03 MED ORDER — MOXIFLOXACIN HCL 0.5 % OP SOLN
1.0000 [drp] | Freq: Once | OPHTHALMIC | Status: AC
  Administered 2016-02-03: 5 [drp] via OPHTHALMIC
  Filled 2016-02-03: qty 3

## 2016-02-03 MED ORDER — EPINEPHRINE PF 1 MG/ML IJ SOLN
INTRAOCULAR | Status: DC | PRN
  Administered 2016-02-03: 250 mL via OPHTHALMIC

## 2016-02-03 MED ORDER — LIDOCAINE HCL (PF) 4 % IJ SOLN
INTRAOCULAR | Status: DC | PRN
  Administered 2016-02-03: 4 mL via OPHTHALMIC

## 2016-02-03 MED ORDER — LIDOCAINE HCL (PF) 4 % IJ SOLN
INTRAMUSCULAR | Status: AC
  Filled 2016-02-03: qty 5

## 2016-02-03 MED ORDER — MIDAZOLAM HCL 2 MG/2ML IJ SOLN
INTRAMUSCULAR | Status: DC | PRN
  Administered 2016-02-03: 1 mg via INTRAVENOUS
  Administered 2016-02-03: 2 mg via INTRAVENOUS
  Administered 2016-02-03: 1 mg via INTRAVENOUS

## 2016-02-03 MED ORDER — SODIUM CHLORIDE 0.9 % IV SOLN
INTRAVENOUS | Status: DC
Start: 2016-02-03 — End: 2016-02-03
  Administered 2016-02-03: 08:00:00 via INTRAVENOUS

## 2016-02-03 MED ORDER — SODIUM HYALURONATE 23 MG/ML IO SOLN
INTRAOCULAR | Status: DC | PRN
  Administered 2016-02-03: 0.6 mL via INTRAOCULAR

## 2016-02-03 MED ORDER — POVIDONE-IODINE 5 % OP SOLN
OPHTHALMIC | Status: AC
  Filled 2016-02-03: qty 30

## 2016-02-03 MED ORDER — SODIUM HYALURONATE 10 MG/ML IO SOLN
INTRAOCULAR | Status: AC
  Filled 2016-02-03: qty 0.85

## 2016-02-03 MED ORDER — TETRACAINE HCL 0.5 % OP SOLN
OPHTHALMIC | Status: DC | PRN
  Administered 2016-02-03: 2 [drp] via OPHTHALMIC

## 2016-02-03 MED ORDER — SODIUM HYALURONATE 10 MG/ML IO SOLN
INTRAOCULAR | Status: DC | PRN
  Administered 2016-02-03: 0.85 mL via INTRAOCULAR

## 2016-02-03 MED ORDER — FENTANYL CITRATE (PF) 100 MCG/2ML IJ SOLN
INTRAMUSCULAR | Status: DC | PRN
  Administered 2016-02-03: 50 ug via INTRAVENOUS

## 2016-02-03 SURGICAL SUPPLY — 22 items
CANNULA ANT/CHMB 27GA (MISCELLANEOUS) ×6 IMPLANT
CUP MEDICINE 2OZ PLAST GRAD ST (MISCELLANEOUS) ×3 IMPLANT
GLOVE BIO SURGEON STRL SZ8 (GLOVE) ×3 IMPLANT
GLOVE BIOGEL M 6.5 STRL (GLOVE) ×3 IMPLANT
GLOVE SURG LX 7.5 STRW (GLOVE) ×2
GLOVE SURG LX STRL 7.5 STRW (GLOVE) ×1 IMPLANT
GOWN STRL REUS W/ TWL LRG LVL3 (GOWN DISPOSABLE) ×2 IMPLANT
GOWN STRL REUS W/TWL LRG LVL3 (GOWN DISPOSABLE) ×4
LENS IOL ACRSF IQ PC 18.5 (Intraocular Lens) ×1 IMPLANT
LENS IOL ACRYSOF IQ POST 18.5 (Intraocular Lens) ×3 IMPLANT
PACK CATARACT (MISCELLANEOUS) ×3 IMPLANT
PACK CATARACT BRASINGTON LX (MISCELLANEOUS) ×3 IMPLANT
PACK EYE AFTER SURG (MISCELLANEOUS) ×3 IMPLANT
SOL BSS BAG (MISCELLANEOUS) ×3
SOL PREP PVP 2OZ (MISCELLANEOUS) ×3
SOLUTION BSS BAG (MISCELLANEOUS) ×1 IMPLANT
SOLUTION PREP PVP 2OZ (MISCELLANEOUS) ×1 IMPLANT
SYR 3ML LL SCALE MARK (SYRINGE) ×6 IMPLANT
SYR 5ML LL (SYRINGE) ×3 IMPLANT
SYR TB 1ML 27GX1/2 LL (SYRINGE) ×3 IMPLANT
WATER STERILE IRR 250ML POUR (IV SOLUTION) ×3 IMPLANT
WIPE NON LINTING 3.25X3.25 (MISCELLANEOUS) ×3 IMPLANT

## 2016-02-03 NOTE — Anesthesia Procedure Notes (Signed)
Procedure Name: MAC Performed by: Merina Behrendt Pre-anesthesia Checklist: Emergency Drugs available, Patient identified, Suction available, Patient being monitored and Timeout performed Oxygen Delivery Method: Nasal cannula       

## 2016-02-03 NOTE — Anesthesia Postprocedure Evaluation (Signed)
Anesthesia Post Note  Patient: Seth LLOYD Sr.  Procedure(s) Performed: Procedure(s) (LRB): CATARACT EXTRACTION PHACO AND INTRAOCULAR LENS PLACEMENT ([place]) (Left)  Patient location during evaluation: Short Stay Anesthesia Type: MAC Level of consciousness: awake and alert and oriented Pain management: pain level controlled Vital Signs Assessment: post-procedure vital signs reviewed and stable Respiratory status: spontaneous breathing, nonlabored ventilation and respiratory function stable Cardiovascular status: blood pressure returned to baseline and stable Postop Assessment: no headache Anesthetic complications: no    Last Vitals:  Vitals:   02/03/16 0804 02/03/16 1125  BP: (!) 142/77 (!) 143/81  Pulse: 61 (!) 59  Resp: 16   Temp: 37.1 C 36.1 C    Last Pain:  Vitals:   02/03/16 1125  TempSrc: Temporal                 Nadene Rubins

## 2016-02-03 NOTE — Anesthesia Preprocedure Evaluation (Signed)
Anesthesia Evaluation  Patient identified by MRN, date of birth, ID band Patient awake    Airway Mallampati: II       Dental  (+) Teeth Intact   Pulmonary sleep apnea , former smoker,     + decreased breath sounds      Cardiovascular Exercise Tolerance: Good hypertension, Pt. on medications + CAD   Rhythm:Regular Rate:Normal     Neuro/Psych Depression    GI/Hepatic Neg liver ROS, GERD  Medicated,  Endo/Other  diabetes, Well Controlled, Type 2, Oral Hypoglycemic AgentsMorbid obesity  Renal/GU negative Renal ROS     Musculoskeletal   Abdominal (+) + obese,   Peds  Hematology   Anesthesia Other Findings   Reproductive/Obstetrics                             Anesthesia Physical Anesthesia Plan  ASA: III  Anesthesia Plan: MAC   Post-op Pain Management:    Induction:   Airway Management Planned: Natural Airway and Nasal Cannula  Additional Equipment:   Intra-op Plan:   Post-operative Plan:   Informed Consent: I have reviewed the patients History and Physical, chart, labs and discussed the procedure including the risks, benefits and alternatives for the proposed anesthesia with the patient or authorized representative who has indicated his/her understanding and acceptance.     Plan Discussed with: CRNA  Anesthesia Plan Comments:         Anesthesia Quick Evaluation

## 2016-02-03 NOTE — Op Note (Signed)
OPERATIVE NOTE  Seth MINER Sr. [id number] 02/03/2016   PREOPERATIVE DIAGNOSIS:  Nuclear sclerotic cataract left eye.  H25.12   POSTOPERATIVE DIAGNOSIS:    Nuclear sclerotic cataract left eye.     PROCEDURE:  Phacoemusification with posterior chamber intraocular lens placement of the left eye   LENS:   Implant Name Type Inv. Item Serial No. Manufacturer Lot No. LRB No. Used  IMPLANT LENS - [id number] Intraocular Lens IMPLANT LENS [id number] ALCON   Left 1       [id number] 18.5   ULTRASOUND TIME: 0 minutes 52 seconds.  CDE 7.21   SURGEON:  Benay Pillow, MD, MPH   ANESTHESIA:  Topical with tetracaine drops, augmented with 1% preservative-free intracameral lidocaine.   COMPLICATIONS:  None.   DESCRIPTION OF PROCEDURE:  The patient was identified in the holding room and transported to the operating room and placed in the supine position under the operating microscope.  The left eye was identified as the operative eye and it was prepped and draped in the usual sterile ophthalmic fashion.   A 1.0 millimeter clear-corneal paracentesis was made at the 5:00 position. 0.5 ml of preservative-free 1% lidocaine with epinephrine was injected into the anterior chamber.  The anterior chamber was filled with Healon 5 viscoelastic.  A 2.4 millimeter keratome was used to make a near-clear corneal incision at the 2:00 position.  A curvilinear capsulorrhexis was made with a cystotome and capsulorrhexis forceps.  Balanced salt solution was used to hydrodissect and hydrodelineate the nucleus.   Phacoemulsification was then used in stop and chop fashion to remove the lens nucleus and epinucleus.  The remaining cortex was then removed using the irrigation and aspiration handpiece. Healon was then placed into the capsular bag to distend it for lens placement.  A lens was then injected into the capsular bag.  The remaining viscoelastic was aspirated.   Wounds were hydrated with balanced salt solution.  The  anterior chamber was inflated to a physiologic pressure with balanced salt solution.   Intracameral vigamox 0.1 mL undiltued was injected into the eye.  No wound leaks were noted.  Topical Vigamox drops were applied to the eye.  The patient was taken to the recovery room in stable condition without complications of anesthesia or surgery  Benay Pillow 02/03/2016, 11:20 AM

## 2016-02-03 NOTE — H&P (Signed)
The History and Physical notes are on paper, have been signed, and are to be scanned. The patient remains stable and unchanged from the H&P.   Previous H&P reviewed, patient examined, and there are no changes.  Seth Mcintosh 02/03/2016 10:49 AM

## 2016-02-03 NOTE — Transfer of Care (Signed)
Immediate Anesthesia Transfer of Care Note  Patient: Seth Mulberry Sr.  Procedure(s) Performed: Procedure(s) with comments: CATARACT EXTRACTION PHACO AND INTRAOCULAR LENS PLACEMENT ([place]) (Left) - Lot# [id number] H [place]: 00:52.3 AP%: 13.8 CDE: 7.21  Patient Location: PACU and Short Stay  Anesthesia Type:MAC  Level of Consciousness: awake, alert  and oriented  Airway & Oxygen Therapy: Patient Spontanous Breathing  Post-op Assessment: Report given to RN and Post -op Vital signs reviewed and stable  Post vital signs: Reviewed and stable  Last Vitals:  Vitals:   02/03/16 0804 02/03/16 1125  BP: (!) 142/77 (!) 143/81  Pulse: 61 (!) 59  Resp: 16 18  Temp: 37.1 C 36.1 C    Last Pain:  Vitals:   02/03/16 1125  TempSrc: Temporal         Complications: No apparent anesthesia complications

## 2016-02-03 NOTE — Discharge Instructions (Signed)
Eye Surgery Discharge Instructions  Expect mild scratchy sensation or mild soreness. DO NOT RUB YOUR EYE!  The day of surgery:  Minimal physical activity, but bed rest is not required  No reading, computer work, or close hand work  No bending, lifting, or straining.  May watch TV  For 24 hours:  No driving, legal decisions, or alcoholic beverages  Safety precautions  Eat anything you prefer: It is better to start with liquids, then soup then solid foods.  _____ Eye patch should be worn until postoperative exam tomorrow.  ____ Solar shield eyeglasses should be worn for comfort in the sunlight/patch while sleeping  Resume all regular medications including aspirin or Coumadin if these were discontinued prior to surgery. You may shower, bathe, shave, or wash your hair. Tylenol may be taken for mild discomfort.  Call your doctor if you experience significant pain, nausea, or vomiting, fever > 101 or other signs of infection. [phone] or [phone] Specific instructions:  Follow-up Information    Benay Pillow, MD. Go on 02/04/2016.   Specialty:  Ophthalmology Why:  10:45am Contact information: [place] [address] [place], [place] .   Specialty:  Pediatric Anesthesia Contact information: [address] [place] [address] [phone]

## 2016-02-07 ENCOUNTER — Encounter (HOSPITAL_BASED_OUTPATIENT_CLINIC_OR_DEPARTMENT_OTHER): Payer: Medicare Other | Admitting: General Surgery

## 2016-02-07 DIAGNOSIS — L97213 Non-pressure chronic ulcer of right calf with necrosis of muscle: Secondary | ICD-10-CM

## 2016-02-07 DIAGNOSIS — E11622 Type 2 diabetes mellitus with other skin ulcer: Secondary | ICD-10-CM | POA: Diagnosis not present

## 2016-02-07 DIAGNOSIS — I2510 Atherosclerotic heart disease of native coronary artery without angina pectoris: Secondary | ICD-10-CM | POA: Diagnosis not present

## 2016-02-07 DIAGNOSIS — I10 Essential (primary) hypertension: Secondary | ICD-10-CM | POA: Diagnosis not present

## 2016-02-07 DIAGNOSIS — Z87891 Personal history of nicotine dependence: Secondary | ICD-10-CM | POA: Diagnosis not present

## 2016-02-07 DIAGNOSIS — M70861 Other soft tissue disorders related to use, overuse and pressure, right lower leg: Secondary | ICD-10-CM | POA: Diagnosis not present

## 2016-02-07 NOTE — Progress Notes (Signed)
See I heal 

## 2016-02-07 NOTE — Progress Notes (Signed)
ALDOUS, LANDSMAN ([id number]) Visit Report for 02/07/2016 Arrival Information Details Patient Name: Seth Mcintosh, Seth Mcintosh Date of Service: 02/07/2016 10:45 AM Medical Record Number: [id number] Patient Account Number: [id number] Date of Birth/Sex: Oct 23, 1940 (75 y.o. Male) Treating RN: Ahmed Prima Primary Care Physician: [facility], Dossie Der Other Clinician: Referring Physician: [facility], [facility] Treating Physician/Extender: Benjaman Pott in Treatment: 20 Visit Information History Since Last Visit All ordered tests and consults were completed: No Patient Arrived: Ambulatory Added or deleted any medications: No Arrival Time: 10:55 Any new allergies or adverse reactions: No Accompanied By: self Had a fall or experienced change in No Transfer Assistance: None activities of daily living that may affect Patient Identification Verified: Yes risk of falls: Secondary Verification Process Yes Signs or symptoms of abuse/neglect since last No Completed: visito Patient Requires Transmission-Based No Hospitalized since last visit: No Precautions: Pain Present Now: No Patient Has Alerts: Yes Patient Alerts: DM II Electronic Signature(s) Signed: 02/07/2016 4:03:46 PM By: Alric Quan Entered By: Alric Quan on 02/07/2016 10:56:10 Seth Mcintosh ([id number]) -------------------------------------------------------------------------------- Clinic Level of Care Assessment Details Patient Name: Seth Mcintosh Date of Service: 02/07/2016 10:45 AM Medical Record Number: [id number] Patient Account Number: [id number] Date of Birth/Sex: 08-Nov-1940 (75 y.o. Male) Treating RN: Ahmed Prima Primary Care Physician: [facility], Dossie Der Other Clinician: Referring Physician: [facility], [facility] Treating Physician/Extender: Benjaman Pott in Treatment: 20 Clinic Level of Care Assessment Items TOOL 4 Quantity Score X - Use when only an EandM is performed on FOLLOW-UP visit 1 0 ASSESSMENTS - Nursing  Assessment / Reassessment X - Reassessment of Co-morbidities (includes updates in patient status) 1 10 X - Reassessment of Adherence to Treatment Plan 1 5 ASSESSMENTS - Wound and Skin Assessment / Reassessment X - Simple Wound Assessment / Reassessment - one wound 1 5 []  - Complex Wound Assessment / Reassessment - multiple wounds 0 []  - Dermatologic / Skin Assessment (not related to wound area) 0 ASSESSMENTS - Focused Assessment []  - Circumferential Edema Measurements - multi extremities 0 []  - Nutritional Assessment / Counseling / Intervention 0 []  - Lower Extremity Assessment (monofilament, tuning fork, pulses) 0 []  - Peripheral Arterial Disease Assessment (using hand held doppler) 0 ASSESSMENTS - Ostomy and/or Continence Assessment and Care []  - Incontinence Assessment and Management 0 []  - Ostomy Care Assessment and Management (repouching, etc.) 0 PROCESS - Coordination of Care X - Simple Patient / Family Education for ongoing care 1 15 []  - Complex (extensive) Patient / Family Education for ongoing care 0 X - Staff obtains Programmer, systems, Records, Test Results / Process Orders 1 10 []  - Staff telephones HHA, Nursing Homes / Clarify orders / etc 0 []  - Routine Transfer to another Facility (non-emergent condition) 0 Seth Mcintosh, Seth Mcintosh ([id number]) []  - Routine Hospital Admission (non-emergent condition) 0 []  - New Admissions / Biomedical engineer / Ordering NPWT, Apligraf, etc. 0 []  - Emergency Hospital Admission (emergent condition) 0 X - Simple Discharge Coordination 1 10 []  - Complex (extensive) Discharge Coordination 0 PROCESS - Special Needs []  - Pediatric / Minor Patient Management 0 []  - Isolation Patient Management 0 []  - Hearing / Language / Visual special needs 0 []  - Assessment of Community assistance (transportation, D/C planning, etc.) 0 []  - Additional assistance / Altered mentation 0 []  - Support Surface(s) Assessment (bed, cushion, seat, etc.) 0 INTERVENTIONS - Wound  Cleansing / Measurement X - Simple Wound Cleansing - one wound 1 5 []  - Complex Wound Cleansing - multiple wounds 0 X - Wound Imaging (photographs - any number  of wounds) 1 5 []  - Wound Tracing (instead of photographs) 0 []  - Simple Wound Measurement - one wound 0 X - Complex Wound Measurement - multiple wounds 1 5 INTERVENTIONS - Wound Dressings X - Small Wound Dressing one or multiple wounds 1 10 []  - Medium Wound Dressing one or multiple wounds 0 []  - Large Wound Dressing one or multiple wounds 0 X - Application of Medications - topical 1 5 []  - Application of Medications - injection 0 INTERVENTIONS - Miscellaneous []  - External ear exam 0 Seth Mcintosh, Seth Mcintosh ([id number]) []  - Specimen Collection (cultures, biopsies, blood, body fluids, etc.) 0 []  - Specimen(s) / Culture(s) sent or taken to Lab for analysis 0 []  - Patient Transfer (multiple staff / Harrel Lemon Lift / Similar devices) 0 []  - Simple Staple / Suture removal (25 or less) 0 []  - Complex Staple / Suture removal (26 or more) 0 []  - Hypo / Hyperglycemic Management (close monitor of Blood Glucose) 0 []  - Ankle / Brachial Index (ABI) - do not check if billed separately 0 X - Vital Signs 1 5 Has the patient been seen at the hospital within the last three years: Yes Total Score: 90 Level Of Care: New/Established - Level 3 Electronic Signature(s) Signed: 02/07/2016 4:03:46 PM By: Alric Quan Entered By: Alric Quan on 02/07/2016 13:56:15 Seth Mcintosh ([id number]) -------------------------------------------------------------------------------- Encounter Discharge Information Details Patient Name: Seth Mcintosh Date of Service: 02/07/2016 10:45 AM Medical Record Number: [id number] Patient Account Number: [id number] Date of Birth/Sex: [birth year] (75 y.o. Male) Treating RN: Ahmed Prima Primary Care Physician: [facility], Dossie Der Other Clinician: Referring Physician: [facility], [facility] Treating Physician/Extender: Benjaman Pott in Treatment: 20 Encounter Discharge Information Items Discharge Pain Level: 0 Discharge Condition: Stable Ambulatory Status: Ambulatory Discharge Destination: Home Transportation: Private Auto Accompanied By: self Schedule Follow-up Appointment: Yes Medication Reconciliation completed and provided to Patient/Care Yes Iseah Plouff: Provided on Clinical Summary of Care: 02/07/2016 Form Type Recipient Paper Patient JK Electronic Signature(s) Signed: 02/07/2016 11:30:40 AM By: Judene Companion MD Previous Signature: 02/07/2016 11:24:37 AM Version By: Ruthine Dose Entered By: Judene Companion on 02/07/2016 11:30:39 Seth Mcintosh ([id number]) -------------------------------------------------------------------------------- Lower Extremity Assessment Details Patient Name: Seth Mcintosh Date of Service: 02/07/2016 10:45 AM Medical Record Number: [id number] Patient Account Number: [id number] Date of Birth/Sex: 1941/01/03 (75 y.o. Male) Treating RN: Ahmed Prima Primary Care Physician: [facility], Dossie Der Other Clinician: Referring Physician: [facility], [facility] Treating Physician/Extender: Benjaman Pott in Treatment: 20 Vascular Assessment Pulses: Posterior Tibial Dorsalis Pedis Palpable: [Right:Yes] Extremity colors, hair growth, and conditions: Extremity Color: [Right:Normal] Temperature of Extremity: [Right:Warm] Capillary Refill: [Right:< 3 seconds] Electronic Signature(s) Signed: 02/07/2016 4:03:46 PM By: Alric Quan Entered By: Alric Quan on 02/07/2016 10:58:22 Seth Mcintosh ([id number]) -------------------------------------------------------------------------------- Multi Wound Chart Details Patient Name: Seth Mcintosh Date of Service: 02/07/2016 10:45 AM Medical Record Number: [id number] Patient Account Number: [id number] Date of Birth/Sex: 18-Sep-1940 (75 y.o. Male) Treating RN: Ahmed Prima Primary Care Physician: [facility], Dossie Der Other  Clinician: Referring Physician: [facility], [facility] Treating Physician/Extender: Benjaman Pott in Treatment: 20 Vital Signs Height(in): 74 Pulse(bpm): 60 Weight(lbs): 265 Blood Pressure 123/70 (mmHg): Body Mass Index(BMI): 34 Temperature(F): 97.6 Respiratory Rate 18 (breaths/min): Photos: [1:No Photos] [N/A:N/A] Wound Location: [1:Right Lower Leg - Lateral] [N/A:N/A] Wounding Event: [1:Trauma] [N/A:N/A] Primary Etiology: [1:Diabetic Wound/Ulcer of the Lower Extremity] [N/A:N/A] Comorbid History: [1:Glaucoma, Sleep Apnea, Coronary Artery Disease, Hypertension, Type II Diabetes, Osteoarthritis] [N/A:N/A] Date Acquired: [1:08/07/2015] [N/A:N/A] Weeks of Treatment: [1:20] [N/A:N/A] Wound Status: [1:Open] [N/A:N/A] Measurements L x W  x D 0.7x0.2x0.2 [N/A:N/A] (cm) Area (cm) : [1:0.11] [N/A:N/A] Volume (cm) : [1:0.022] [N/A:N/A] % Reduction in Area: [1:97.20%] [N/A:N/A] % Reduction in Volume: 99.20% [N/A:N/A] Position 1 (o'clock): 6 Maximum Distance 1 1.6 (cm): Tunneling: [1:Yes] [N/A:N/A] Classification: [1:Grade 1] [N/A:N/A] Exudate Amount: [1:Large] [N/A:N/A] Exudate Type: [1:Serosanguineous] [N/A:N/A] Exudate Color: [1:red, brown] [N/A:N/A] Wound Margin: [1:Thickened] [N/A:N/A] Granulation Amount: [1:Large (67-100%)] [N/A:N/A] Granulation Quality: [1:Red] [N/A:N/A] Necrotic Amount: [1:Small (1-33%)] [N/A:N/A] Exposed Structures: Fascia: No N/A N/A Fat: No Tendon: No Muscle: No Joint: No Bone: No Limited to Skin Breakdown Epithelialization: None N/A N/A Periwound Skin Texture: Induration: Yes N/A N/A Edema: No Excoriation: No Callus: No Crepitus: No Fluctuance: No Friable: No Rash: No Scarring: No Periwound Skin Maceration: No N/A N/A Moisture: Moist: No Dry/Scaly: No Periwound Skin Color: Erythema: Yes N/A N/A Atrophie Blanche: No Cyanosis: No Ecchymosis: No Hemosiderin Staining: No Mottled: No Pallor: No Rubor: No Temperature: No Abnormality  N/A N/A Tenderness on Yes N/A N/A Palpation: Wound Preparation: Ulcer Cleansing: N/A N/A Rinsed/Irrigated with Saline Topical Anesthetic Applied: Other: lidocaine 4% Treatment Notes Electronic Signature(s) Signed: 02/07/2016 4:03:46 PM By: Alric Quan Entered By: Alric Quan on 02/07/2016 11:04:15 Seth Mcintosh ([id number]) -------------------------------------------------------------------------------- Multi-Disciplinary Care Plan Details Patient Name: Seth Mcintosh Date of Service: 02/07/2016 10:45 AM Medical Record Number: [id number] Patient Account Number: [id number] Date of Birth/Sex: Sep 04, 1940 (75 y.o. Male) Treating RN: Ahmed Prima Primary Care Physician: [facility], Dossie Der Other Clinician: Referring Physician: [facility], [facility] Treating Physician/Extender: Benjaman Pott in Treatment: 20 Active Inactive Abuse / Safety / Falls / Self Care Management Nursing Diagnoses: Potential for falls Goals: Patient will remain injury free Date Initiated: 09/16/2015 Goal Status: Active Interventions: Assess fall risk on admission and as needed Notes: Orientation to the Wound Care Program Nursing Diagnoses: Knowledge deficit related to the wound healing center program Goals: Patient/caregiver will verbalize understanding of the [place] Program Date Initiated: 09/16/2015 Goal Status: Active Interventions: Provide education on orientation to the wound center Notes: Pain, Acute or Chronic Nursing Diagnoses: Pain, acute or chronic: actual or potential Potential alteration in comfort, pain Goals: Seth Mcintosh, Seth Mcintosh ([id number]) Patient will verbalize adequate pain control and receive pain control interventions during procedures as needed Date Initiated: 09/16/2015 Goal Status: Active Interventions: Assess comfort goal upon admission Complete pain assessment as per visit requirements Notes: Soft Tissue Infection Nursing Diagnoses: Impaired tissue  integrity Goals: Patient will remain free of wound infection Date Initiated: 09/16/2015 Goal Status: Active Interventions: Assess signs and symptoms of infection every visit Notes: Wound/Skin Impairment Nursing Diagnoses: Impaired tissue integrity Goals: Ulcer/skin breakdown will have a volume reduction of 30% by week 4 Date Initiated: 09/16/2015 Goal Status: Active Ulcer/skin breakdown will have a volume reduction of 50% by week 8 Date Initiated: 09/16/2015 Goal Status: Active Ulcer/skin breakdown will have a volume reduction of 80% by week 12 Date Initiated: 09/16/2015 Goal Status: Active Interventions: Assess patient/caregiver ability to obtain necessary supplies Assess ulceration(s) every visit Notes: Seth Mcintosh, Seth Mcintosh ([id number]) Electronic Signature(s) Signed: 02/07/2016 4:03:46 PM By: Alric Quan Entered By: Alric Quan on 02/07/2016 11:04:08 Seth Mcintosh ([id number]) -------------------------------------------------------------------------------- Pain Assessment Details Patient Name: Seth Mcintosh Date of Service: 02/07/2016 10:45 AM Medical Record Number: [id number] Patient Account Number: [id number] Date of Birth/Sex: [birth year] (75 y.o. Male) Treating RN: Ahmed Prima Primary Care Physician: [facility], Dossie Der Other Clinician: Referring Physician: Keith Rake Treating Physician/Extender: Benjaman Pott in Treatment: 20 Active Problems Location of Pain Severity and Description of Pain Patient Has Paino No Site Locations With  Dressing Change: No Pain Management and Medication Current Pain Management: Electronic Signature(s) Signed: 02/07/2016 4:03:46 PM By: Alric Quan Entered By: Alric Quan on 02/07/2016 10:56:16 Seth Mcintosh ([id number]) -------------------------------------------------------------------------------- Patient/Caregiver Education Details Patient Name: Seth Mcintosh Date of Service: 02/07/2016 10:45  AM Medical Record Number: [id number] Patient Account Number: [id number] Date of Birth/Gender: 06-21-1940 (75 y.o. Male) Treating RN: Ahmed Prima Primary Care Physician: [facility], Dossie Der Other Clinician: Referring Physician: [facility], [facility] Treating Physician/Extender: Benjaman Pott in Treatment: 20 Education Assessment Education Provided To: Patient Education Topics Provided Wound/Skin Impairment: Handouts: Other: change dressing as ordered Methods: Demonstration, Explain/Verbal Responses: State content correctly Electronic Signature(s) Signed: 02/07/2016 3:40:17 PM By: Judene Companion MD Entered By: Judene Companion on 02/07/2016 11:30:48 Seth Mcintosh ([id number]) -------------------------------------------------------------------------------- Wound Assessment Details Patient Name: Seth Mcintosh Date of Service: 02/07/2016 10:45 AM Medical Record Number: [id number] Patient Account Number: [id number] Date of Birth/Sex: [birth year] (75 y.o. Male) Treating RN: Ahmed Prima Primary Care Physician: [facility], Dossie Der Other Clinician: Referring Physician: [facility] Treating Physician/Extender: Benjaman Pott in Treatment: 20 Wound Status Wound Number: 1 Primary Diabetic Wound/Ulcer of the Lower Etiology: Extremity Wound Location: Right Lower Leg - Lateral Wound Open Wounding Event: Trauma Status: Date Acquired: 08/07/2015 Comorbid Glaucoma, Sleep Apnea, Coronary Weeks Of Treatment: 20 History: Artery Disease, Hypertension, Type II Clustered Wound: No Diabetes, Osteoarthritis Photos Photo Uploaded By: Alric Quan on 02/07/2016 11:28:02 Wound Measurements Length: (cm) 0.7 Width: (cm) 0.2 Depth: (cm) 0.2 Area: (cm) 0.11 Volume: (cm) 0.022 % Reduction in Area: 97.2% % Reduction in Volume: 99.2% Epithelialization: None Tunneling: Yes Position (o'clock): 6 Maximum Distance: (cm) 1.6 Undermining: No Wound Description Classification: Grade 1 Foul Odor  Aft Wound Margin: Thickened Exudate Amount: Large Exudate Type: Serosanguineous Exudate Color: red, brown er Cleansing: No Wound Bed Granulation Amount: Large (67-100%) Exposed Structure Seth Mcintosh, Seth Mcintosh ([id number]) Granulation Quality: Red Fascia Exposed: No Necrotic Amount: Small (1-33%) Fat Layer Exposed: No Necrotic Quality: Adherent Slough Tendon Exposed: No Muscle Exposed: No Joint Exposed: No Bone Exposed: No Limited to Skin Breakdown Periwound Skin Texture Texture Color No Abnormalities Noted: No No Abnormalities Noted: No Callus: No Atrophie Blanche: No Crepitus: No Cyanosis: No Excoriation: No Ecchymosis: No Fluctuance: No Erythema: Yes Friable: No Hemosiderin Staining: No Induration: Yes Mottled: No Localized Edema: No Pallor: No Rash: No Rubor: No Scarring: No Temperature / Pain Moisture Temperature: No Abnormality No Abnormalities Noted: No Tenderness on Palpation: Yes Dry / Scaly: No Maceration: No Moist: No Wound Preparation Ulcer Cleansing: Rinsed/Irrigated with Saline Topical Anesthetic Applied: Other: lidocaine 4%, Treatment Notes Wound #1 (Right, Lateral Lower Leg) 1. Cleansed with: Clean wound with Normal Saline 2. Anesthetic Topical Lidocaine 4% cream to wound bed prior to debridement 3. Peri-wound Care: Skin Prep 4. Dressing Applied: Iodoform packing Gauze 5. Secondary Blair Signature(s) Signed: 02/07/2016 4:03:46 PM By: Alric Quan Entered By: Alric Quan on 02/07/2016 11:03:06 Seth Mcintosh ([id number]) Seth Mcintosh ([id number]) -------------------------------------------------------------------------------- [place] Details Patient Name: Seth Mcintosh Date of Service: 02/07/2016 10:45 AM Medical Record Number: [id number] Patient Account Number: [id number] Date of Birth/Sex: May 09, 1940 (75 y.o. Male) Treating RN: Carolyne Fiscal, Debi Primary Care Physician: [facility],  [place] Other Clinician: Referring Physician: [facility], [facility] Treating Physician/Extender: Benjaman Pott in Treatment: 20 Vital Signs Time Taken: 10:56 Temperature (F): 97.6 Height (in): 74 Pulse (bpm): 60 Weight (lbs): 265 Respiratory Rate (breaths/min): 18 Body Mass Index (BMI): 34 Blood Pressure (mmHg): 123/70 Reference Range: 80 - 120 mg / dl Electronic Signature(s) Signed:  02/07/2016 4:03:46 PM By: Alric Quan Entered By: Alric Quan on 02/07/2016 10:58:00

## 2016-02-07 NOTE — Progress Notes (Signed)
Seth, Mcintosh ([id number]) Visit Report for 02/07/2016 Chief Complaint Document Details Patient Name: Seth Mcintosh, Seth Mcintosh Date of Service: 02/07/2016 10:45 AM Medical Record Number: [id number] Patient Account Number: [id number] Date of Birth/Sex: 09-12-1940 (75 y.o. Male) Treating RN: Ahmed Prima Primary Care Physician: [facility], Dossie Der Other Clinician: Referring Physician: Keith Rake Treating Physician/Extender: Benjaman Pott in Treatment: 20 Information Obtained from: Patient Chief Complaint Patient seen for complaints of Non-Healing Wound which she's had on the right lower extremity for about 5 weeks Electronic Signature(s) Signed: 02/07/2016 11:26:42 AM By: Judene Companion MD Entered By: Judene Companion on 02/07/2016 11:26:42 Seth Mcintosh ([id number]) -------------------------------------------------------------------------------- HPI Details Patient Name: Seth Mcintosh Date of Service: 02/07/2016 10:45 AM Medical Record Number: [id number] Patient Account Number: [id number] Date of Birth/Sex: [birth year] (75 y.o. Male) Treating RN: Ahmed Prima Primary Care Physician: [facility], Dossie Der Other Clinician: Referring Physician: [facility], [facility] Treating Physician/Extender: Benjaman Pott in Treatment: 20 History of Present Illness Location: open wound right lower extremity lateral leg Quality: Patient reports experiencing a dull pain to affected area(s). Severity: Patient states wound are getting worse. Duration: Patient has had the wound for > 6 weeks prior to seeking treatment at the wound center Timing: Pain in wound is Intermittent (comes and goes Context: The wound occurred when the patient had a injury with a blunt object around 08/07/2015 Modifying Factors: a large swelling with drainage Associated Signs and Symptoms: Patient reports having increase swelling. HPI Description: 75 year old gentleman with known history of diabetes mellitus without complication had  a lacerated wound to his right lower extremity which was repaired about 4 weeks ago. Was seen by his PCP Dr. Donneta Romberg with a open wound on the right lateral leg with some discharge and erythema and he was placed on a blocks and local dressing was applied and referred to [place] for further care. Past medical history significant for diabetes mellitus type 2, coronary artery disease, hypertension, arthritis, depression, foot surgery o2, back surgery, CABG, TURP, right total knee replacement. He is a former smoker and quit in 1994. On questioning the patient he gives a history that he had a blunt injury to his right leg and this happened on April 15 of this year and about 2 weeks later he had blood draining from here and had to go to the ER and have sutures placed. However on looking at his medical records he was in the ER on 05/17/2015 with a blunt injury on his right lower extremity and at that stage x-ray showed no fracture and the right lower leg ultrasound showed no evidence of DVT. Diagnosis of a hematoma was made and he was given symptomatic treatment for this. The next time he was seen in the emergency room was on 08/11/2015 when the patient had a 10 cm laceration to the right lateral leg and this was sutured by the ER physician Dr. Dahlia Client. His blood alcohol level was 162 mg percent that day and the sutures were to be removed in 10 days' time. 09/23/2015 -- had his ultrasound of the left lower extremity for possible hematoma and the impression was there was no discrete drainable hematoma in the area of clinical concern on the left lower extremity. On reviewing his history he clearly states that he never had a large lacerated wound due to the injury or fall on April 19. He says he did have a few drinks but has not drunk and the hematoma which he had for a couple of months just burst open and caused the  big bleed and extrusion of clots. 01/24/16: pt returns today for ongoing evaluation and  management of a chronic right lower leg wound. Not seen here since 01/03/16. "Vacation in [place] with my daughter and granddaughter." he denies systemic s/s of infection. no new wounds or skin breakdown. wound is improving. Electronic Signature(s) Signed: 02/07/2016 11:26:52 AM By: Judene Companion MD Seth Mcintosh ([id number]) Entered By: Judene Companion on 02/07/2016 11:26:51 Seth Mcintosh ([id number]) -------------------------------------------------------------------------------- Physical Exam Details Patient Name: Seth Mcintosh Date of Service: 02/07/2016 10:45 AM Medical Record Number: [id number] Patient Account Number: [id number] Date of Birth/Sex: 1941-01-12 (75 y.o. Male) Treating RN: Ahmed Prima Primary Care Physician: [facility], Dossie Der Other Clinician: Referring Physician: [facility], [facility] Treating Physician/Extender: Benjaman Pott in Treatment: 20 Electronic Signature(s) Signed: 02/07/2016 11:27:06 AM By: Judene Companion MD Entered By: Judene Companion on 02/07/2016 11:27:05 Seth Mcintosh ([id number]) -------------------------------------------------------------------------------- Physician Orders Details Patient Name: Seth Mcintosh Date of Service: 02/07/2016 10:45 AM Medical Record Number: [id number] Patient Account Number: [id number] Date of Birth/Sex: 1941-02-15 (75 y.o. Male) Treating RN: Ahmed Prima Primary Care Physician: [facility], [place]ossie Der Other Clinician: Referring Physician: [facility], [facility] Treating Physician/Extender: Benjaman Pott in Treatment: 20 Verbal / Phone Orders: Yes ClinicianCarolyne Fiscal, Debi Read Back and Verified: Yes Diagnosis Coding Wound Cleansing Wound #1 Right,Lateral Lower Leg o Cleanse wound with mild soap and water o May Shower, gently pat wound dry prior to applying new dressing. Anesthetic Wound #1 Right,Lateral Lower Leg o Topical Lidocaine 4% cream applied to wound bed prior to debridement Skin Barriers/Peri-Wound  Care Wound #1 Right,Lateral Lower Leg o Skin Prep Primary Wound Dressing Wound #1 Right,Lateral Lower Leg o Iodoform packing Gauze Secondary Dressing Wound #1 Right,Lateral Lower Leg o Non-adherent pad - telfa island dressing Dressing Change Frequency Wound #1 Right,Lateral Lower Leg o Change dressing every day. Edema Control Wound #1 Right,Lateral Lower Leg o Elevate legs to the level of the heart and pump ankles as often as possible o Support Garment 20-30 mm/Hg pressure to: - wear compression stockings Additional Orders / Instructions Wound #1 Right,Lateral Lower Leg o Increase protein intake. ADRIAL, TY ([id number]) Electronic Signature(s) Signed: 02/07/2016 3:40:17 PM By: Judene Companion MD Signed: 02/07/2016 4:03:46 PM By: Alric Quan Entered By: Alric Quan on 02/07/2016 11:16:16 Seth Mcintosh ([id number]) -------------------------------------------------------------------------------- Problem List Details Patient Name: Seth Mcintosh Date of Service: 02/07/2016 10:45 AM Medical Record Number: [id number] Patient Account Number: [id number] Date of Birth/Sex: 1941-02-28 (75 y.o. 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 Male) Treating RN: Ahmed Prima Primary Care Physician: [facility], [place]ossie Der Other Clinician: Referring Physician: [facility], [facility] Treating Physician/Extender: Benjaman Pott in Treatment: 20 Subjective Chief Complaint Information obtained from Patient Patient seen for complaints of Non-Healing Wound which she's had on the right lower extremity for about 5 weeks  History of Present Illness (HPI) The following HPI elements were documented for the patient's wound: Location: open wound right lower extremity lateral leg Quality: Patient reports experiencing a dull pain to affected area(s). Severity: Patient states wound are getting worse. Duration: Patient has had the wound for > 6 weeks prior to seeking treatment at the wound center Timing: Pain in wound is Intermittent (comes and goes Context: The wound occurred when the patient had a injury with a blunt object around 08/07/2015 Modifying Factors: a large swelling with drainage Associated Signs and Symptoms: Patient reports having increase swelling. 75 year old gentleman with known history of diabetes mellitus without complication had a lacerated wound to his right lower extremity which was repaired about 4 weeks ago. Was seen by his PCP Dr. Donneta Romberg with a open wound on the right lateral leg with some discharge and erythema and he was placed on a blocks and local dressing was applied and referred to [place] for further care. Past medical history significant for diabetes mellitus type 2, coronary artery disease, hypertension, arthritis, depression, foot surgery o2, back surgery, CABG, TURP, right total knee replacement. He is a former smoker and quit in 1994. On  questioning the patient he gives a history that he had a blunt injury to his right leg and this happened on April 15 of this year and about 2 weeks later he had blood draining from here and had to go to the ER and have sutures placed. However on looking at his medical records he was in the ER on 05/17/2015 with a blunt injury on his right lower extremity and at that stage x-ray showed no fracture and the right lower leg ultrasound showed no evidence of DVT. Diagnosis of a hematoma was made and he was given symptomatic treatment for this. The next time he was seen in the emergency room was on 08/11/2015 when the patient had a 10 cm laceration to the right lateral leg and this was sutured by the ER physician Dr. Dahlia Client. His blood alcohol level was 162 mg percent that day and the sutures were to be removed in 10 days' time. 09/23/2015 -- had his ultrasound of the left lower extremity for possible hematoma and the impression was there was no discrete drainable hematoma in the area of clinical concern on the left lower extremity. On reviewing his history he clearly states that he never had a large lacerated wound due to the injury or fall on April 19. He says he did have a few drinks but has not drunk and the hematoma which he had for a JAQUARRIUS, TRISTAN. ([id number]) couple of months just burst open and caused the big bleed and extrusion of clots. 01/24/16: pt returns today for ongoing evaluation and management of a chronic right lower leg wound. Not seen here since 01/03/16. "Vacation in [place] with my daughter and granddaughter." he denies systemic s/s of infection. no new wounds or skin breakdown. wound is improving. Objective Constitutional Vitals Time Taken: 10:56 AM, Height: 74 in, Weight: 265 lbs, BMI: 34, Temperature: 97.6 F, Pulse: 60 bpm, Respiratory Rate: 18 breaths/min, Blood Pressure: 123/70 mmHg. Integumentary (Hair, Skin) Wound #1 status is Open. Original cause of wound was Trauma.  The wound is located on the Right,Lateral Lower Leg. The wound measures 0.7cm length x 0.2cm width x 0.2cm depth; 0.11cm^2 area and 0.022cm^3 volume. The wound is limited to skin breakdown. There is no undermining noted, however, there is tunneling at 6:00 with a maximum distance of 1.6cm. There is a  large amount of serosanguineous drainage noted. The wound margin is thickened. There is large (67-100%) red granulation within the wound bed. There is a small (1-33%) amount of necrotic tissue within the wound bed including Adherent Slough. The periwound skin appearance exhibited: Induration, Erythema. The periwound skin appearance did not exhibit: Callus, Crepitus, Excoriation, Fluctuance, Friable, Localized Edema, Rash, Scarring, Dry/Scaly, Maceration, Moist, Atrophie Blanche, Cyanosis, Ecchymosis, Hemosiderin Staining, Mottled, Pallor, Rubor. The surrounding wound skin color is noted with erythema. Periwound temperature was noted as No Abnormality. The periwound has tenderness on palpation. Small opening from spontaeneous drainage of traumatic hematoma right lateral leg. Continue daily iodoform dressings. 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 Anesthetic: Wound #1 Right,Lateral Lower Leg: Topical Lidocaine 4% cream applied to wound bed prior to debridement Skin Barriers/Peri-Wound Care: Wound #1 Right,Lateral Lower Leg: Skin Prep Primary Wound Dressing: Wound #1 Right,Lateral Lower Leg: Iodoform packing Gauze Secondary Dressing: Wound #1 Right,Lateral Lower Leg: Non-adherent pad -  telfa island dressing Dressing Change Frequency: Wound #1 Right,Lateral Lower Leg: Change dressing every day. Edema Control: Wound #1 Right,Lateral Lower Leg: Elevate legs to the level of the heart and pump ankles as often as possible Support Garment 20-30 mm/Hg pressure to: - wear compression stockings Additional Orders / Instructions: Wound #1 Right,Lateral Lower Leg: Increase protein intake. Follow-Up Appointments: A follow-up appointment should be scheduled. Medication Reconciliation completed and provided to Patient/Care Provider. A Patient Clinical Summary of Care was provided to [facility] Electronic Signature(s) Signed: 02/07/2016 11:29:39 AM By: Judene Companion MD Entered By: Judene Companion on 02/07/2016 11:29:39 SCHYLAR, FRIDAY ([id number]) DREXEL, VELARDO ([id number]) -------------------------------------------------------------------------------- [place] Details Patient Name: Seth Mcintosh Date of Service: 02/07/2016 Medical Record Number: [id number] Patient Account Number: [id number] Date of Birth/Sex: 1940-12-04 (75 y.o. Male) Treating RN: Ahmed Prima Primary Care Physician: [facility], Dossie Der Other Clinician: Referring Physician: [facility], Dossie Der Treating Physician/Extender: Benjaman Pott in Treatment: 20 Diagnosis Coding ICD-10 Codes Code Description E11.622 Type 2 diabetes mellitus with other skin ulcer L97.213 Non-pressure chronic ulcer of right calf with necrosis of muscle M70.861 Other soft tissue disorders related to use, overuse and pressure, right lower leg Facility Procedures CPT4 Code: [id number] Description: 99213 - WOUND CARE VISIT-LEV 3 EST PT Modifier: Quantity: 1 Physician Procedures CPT4 Code Description: [id number] - WC PHYS LEVEL 2 - EST PT ICD-10 Description Diagnosis L97.213 Non-pressure chronic ulcer of right calf with necro Modifier: sis of muscle Quantity: 1 Electronic Signature(s) Signed: 02/07/2016 3:40:17 PM By: Judene Companion  MD Signed: 02/07/2016 4:03:46 PM By: Alric Quan Previous Signature: 02/07/2016 11:30:14 AM Version By: Judene Companion MD Entered By: Alric Quan on 02/07/2016 13:56:24

## 2016-02-11 ENCOUNTER — Telehealth: Payer: Self-pay | Admitting: Family Medicine

## 2016-02-11 NOTE — Telephone Encounter (Signed)
Pt needs refill on Klonopin.

## 2016-02-14 ENCOUNTER — Encounter: Payer: Medicare Other | Admitting: Surgery

## 2016-02-14 DIAGNOSIS — I2510 Atherosclerotic heart disease of native coronary artery without angina pectoris: Secondary | ICD-10-CM | POA: Diagnosis not present

## 2016-02-14 DIAGNOSIS — S81801A Unspecified open wound, right lower leg, initial encounter: Secondary | ICD-10-CM | POA: Diagnosis not present

## 2016-02-14 DIAGNOSIS — E11622 Type 2 diabetes mellitus with other skin ulcer: Secondary | ICD-10-CM | POA: Diagnosis not present

## 2016-02-14 DIAGNOSIS — I10 Essential (primary) hypertension: Secondary | ICD-10-CM | POA: Diagnosis not present

## 2016-02-14 DIAGNOSIS — L97213 Non-pressure chronic ulcer of right calf with necrosis of muscle: Secondary | ICD-10-CM | POA: Diagnosis not present

## 2016-02-14 DIAGNOSIS — Z87891 Personal history of nicotine dependence: Secondary | ICD-10-CM | POA: Diagnosis not present

## 2016-02-14 DIAGNOSIS — M70861 Other soft tissue disorders related to use, overuse and pressure, right lower leg: Secondary | ICD-10-CM | POA: Diagnosis not present

## 2016-02-14 NOTE — Telephone Encounter (Signed)
Please schedule patient for an appointment for medication refills. 

## 2016-02-15 NOTE — Progress Notes (Signed)
BARD, NATTRESS ([id number]) Visit Report for 02/14/2016 Chief Complaint Document Details Patient Name: Seth, Mcintosh Date of Service: 02/14/2016 10:45 AM Medical Record Number: [id number] Patient Account Number: [id number] Date of Birth/Sex: 03-May-1940 (75 y.o. Male) Treating RN: Ahmed Prima Primary Care Physician: [facility], [place] Other Clinician: Referring Physician: Keith Rake Treating Physician/Extender: Frann Rider in Treatment: 21 Information Obtained from: Patient Chief Complaint Patient seen for complaints of Non-Healing Wound which she's had on the right lower extremity for about 5 weeks Electronic Signature(s) Signed: 02/14/2016 12:09:12 PM By: Christin Fudge MD, FACS Entered By: Christin Fudge on 02/14/2016 12:09:12 Seth Mcintosh ([id number]) -------------------------------------------------------------------------------- HPI Details Patient Name: Seth Mcintosh Date of Service: 02/14/2016 10:45 AM Medical Record Number: [id number] Patient Account Number: [id number] Date of Birth/Sex: [birth year] (75 y.o. Male) Treating RN: Ahmed Prima Primary Care Physician: [facility], [place] Other Clinician: Referring Physician: [facility], [place] Treating Physician/Extender: Frann Rider in Treatment: 21 History of Present Illness Location: open wound right lower extremity lateral leg Quality: Patient reports experiencing a dull pain to affected area(s). Severity: Patient states wound are getting worse. Duration: Patient has had the wound for > 6 weeks prior to seeking treatment at the wound center Timing: Pain in wound is Intermittent (comes and goes Context: The wound occurred when the patient had a injury with a blunt object around 08/07/2015 Modifying Factors: a large swelling with drainage Associated Signs and Symptoms: Patient reports having increase swelling. HPI Description: 75 year old gentleman with known history of diabetes mellitus without complication had  a lacerated wound to his right lower extremity which was repaired about 4 weeks ago. Was seen by his PCP Dr. Donneta Romberg with a open wound on the right lateral leg with some discharge and erythema and he was placed on a blocks and local dressing was applied and referred to [place] for further care. Past medical history significant for diabetes mellitus type 2, coronary artery disease, hypertension, arthritis, depression, foot surgery o2, back surgery, CABG, TURP, right total knee replacement. He is a former smoker and quit in 1994. On questioning the patient he gives a history that he had a blunt injury to his right leg and this happened on April 15 of this year and about 2 weeks later he had blood draining from here and had to go to the ER and have sutures placed. However on looking at his medical records he was in the ER on 05/17/2015 with a blunt injury on his right lower extremity and at that stage x-ray showed no fracture and the right lower leg ultrasound showed no evidence of DVT. Diagnosis of a hematoma was made and he was given symptomatic treatment for this. The next time he was seen in the emergency room was on 08/11/2015 when the patient had a 10 cm laceration to the right lateral leg and this was sutured by the ER physician Dr. Dahlia Client. His blood alcohol level was 162 mg percent that day and the sutures were to be removed in 10 days' time. 09/23/2015 -- had his ultrasound of the left lower extremity for possible hematoma and the impression was there was no discrete drainable hematoma in the area of clinical concern on the left lower extremity. On reviewing his history he clearly states that he never had a large lacerated wound due to the injury or fall on April 19. He says he did have a few drinks but has not drunk and the hematoma which he had for a couple of months just burst open and caused  the big bleed and extrusion of clots. 01/24/16: pt returns today for ongoing evaluation and  management of a chronic right lower leg wound. Not seen here since 01/03/16. "Vacation in [place] with my daughter and granddaughter." he denies systemic s/s of infection. no new wounds or skin breakdown. wound is improving. Electronic Signature(s) Signed: 02/14/2016 12:09:28 PM By: Christin Fudge MD, FACS Seth Mcintosh, Seth Mcintosh ([id number]) Entered By: Christin Fudge on 02/14/2016 12:09:27 Seth Mcintosh ([id number]) -------------------------------------------------------------------------------- Physical Exam Details Patient Name: Seth Mcintosh Date of Service: 02/14/2016 10:45 AM Medical Record Number: [id number] Patient Account Number: [id number] Date of Birth/Sex: Apr 10, 1941 (75 y.o. Male) Treating RN: Ahmed Prima Primary Care Physician: [facility], [place] Other Clinician: Referring Physician: [facility], [facility] Treating Physician/Extender: Frann Rider in Treatment: 21 Constitutional . Pulse regular. Respirations normal and unlabored. Afebrile. . Eyes Nonicteric. Reactive to light. Ears, Nose, Mouth, and Throat Lips, teeth, and gums WNL.Marland Kitchen Moist mucosa without lesions. Neck supple and nontender. No palpable supraclavicular or cervical adenopathy. Normal sized without goiter. Respiratory WNL. No retractions.. Cardiovascular Pedal Pulses WNL. No clubbing, cyanosis or edema. Lymphatic No adneopathy. No adenopathy. No adenopathy. Musculoskeletal Adexa without tenderness or enlargement.. Digits and nails w/o clubbing, cyanosis, infection, petechiae, ischemia, or inflammatory conditions.. Integumentary (Hair, Skin) No suspicious lesions. No crepitus or fluctuance. No peri-wound warmth or erythema. No masses.Marland Kitchen Psychiatric Judgement and insight Intact.. No evidence of depression, anxiety, or agitation.. Notes wound had some macerated and at the edges and I was able to gently clean this out and the depth of the wound is still present. Electronic Signature(s) Signed: 02/14/2016 12:10:05  PM By: Christin Fudge MD, FACS Entered By: Christin Fudge on 02/14/2016 12:10:05 Seth Mcintosh ([id number]) -------------------------------------------------------------------------------- Physician Orders Details Patient Name: Seth Mcintosh Date of Service: 02/14/2016 10:45 AM Medical Record Number: [id number] Patient Account Number: [id number] Date of Birth/Sex: 11-06-1940 (75 y.o. Male) Treating RN: Ahmed Prima Primary Care Physician: [facility], [place] Other Clinician: Referring Physician: [facility], [facility] Treating Physician/Extender: Frann Rider in Treatment: 74 Verbal / Phone Orders: Yes ClinicianCarolyne Fiscal, Debi Read Back and Verified: Yes Diagnosis Coding Wound Cleansing Wound #1 Right,Lateral Lower Leg o Cleanse wound with mild soap and water o May Shower, gently pat wound dry prior to applying new dressing. Anesthetic Wound #1 Right,Lateral Lower Leg o Topical Lidocaine 4% cream applied to wound bed prior to debridement Skin Barriers/Peri-Wound Care Wound #1 Right,Lateral Lower Leg o Skin Prep Primary Wound Dressing Wound #1 Right,Lateral Lower Leg o Prisma Ag - moisten with saline Secondary Dressing Wound #1 Right,Lateral Lower Leg o Non-adherent pad - telfa island dressing Dressing Change Frequency Wound #1 Right,Lateral Lower Leg o Change dressing every other day. Follow-up Appointments Wound #1 Right,Lateral Lower Leg o Return Appointment in 2 weeks. Edema Control Wound #1 Right,Lateral Lower Leg o Elevate legs to the level of the heart and pump ankles as often as possible o Support Garment 20-30 mm/Hg pressure to: - wear compression stockings Seth Mcintosh, PROBUS. ([id number]) Additional Orders / Instructions Wound #1 Right,Lateral Lower Leg o Increase protein intake. Medications-please add to medication list. Wound #1 Right,Lateral Lower Leg o Other: - Vitamin C, Vitamin A, Zinc, MVI Electronic Signature(s) Signed: 02/14/2016  4:28:25 PM By: Christin Fudge MD, FACS Signed: 02/14/2016 4:58:41 PM By: Alric Quan Entered By: Alric Quan on 02/14/2016 11:17:35 Seth Mcintosh ([id number]) -------------------------------------------------------------------------------- Problem List Details Patient Name: Seth Mcintosh Date of Service: 02/14/2016 10:45 AM Medical Record Number: [id number] Patient Account Number: [id number] Date of Birth/Sex: May 16, 1940 (75 y.o.  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 Male) Treating RN: Ahmed Prima Primary Care Physician: [facility], [place] Other Clinician: Referring Physician: [facility], [facility] Treating Physician/Extender: Frann Rider in Treatment: 21 Subjective Chief Complaint Information obtained from Patient Patient seen for complaints of Non-Healing Wound which she's had on the right lower extremity for about 5 weeks History of Present Illness (HPI) The following HPI elements were documented for the patient's  wound: Location: open wound right lower extremity lateral leg Quality: Patient reports experiencing a dull pain to affected area(s). Severity: Patient states wound are getting worse. Duration: Patient has had the wound for > 6 weeks prior to seeking treatment at the wound center Timing: Pain in wound is Intermittent (comes and goes Context: The wound occurred when the patient had a injury with a blunt object around 08/07/2015 Modifying Factors: a large swelling with drainage Associated Signs and Symptoms: Patient reports having increase swelling. 75 year old gentleman with known history of diabetes mellitus without complication had a lacerated wound to his right lower extremity which was repaired about 4 weeks ago. Was seen by his PCP Dr. Donneta Romberg with a open wound on the right lateral leg with some discharge and erythema and he was placed on a blocks and local dressing was applied and referred to [place] for further care. Past medical history significant for diabetes mellitus type 2, coronary artery disease, hypertension, arthritis, depression, foot surgery o2, back surgery, CABG, TURP, right total knee replacement. He is a former smoker and quit in 1994. On questioning the patient he gives a history that he had a blunt injury to his right leg and this happened on April 15 of this year and about 2 weeks later he had blood draining from here and had to go to the ER and have sutures placed. However on looking at his medical records he was in the ER on 05/17/2015 with a blunt injury on his right lower extremity and at that stage x-ray showed no fracture and the right lower leg ultrasound showed no evidence of DVT. Diagnosis of a hematoma was made and he was given symptomatic treatment for this. The next time he was seen in the emergency room was on 08/11/2015 when the patient had a 10 cm laceration to the right lateral leg and this was sutured by the ER physician Dr. Dahlia Client. His blood alcohol level  was 162 mg percent that day and the sutures were to be removed in 10 days' time. 09/23/2015 -- had his ultrasound of the left lower extremity for possible hematoma and the impression was there was no discrete drainable hematoma in the area of clinical concern on the left lower extremity. On reviewing his history he clearly states that he never had a large lacerated wound due to the injury or fall on April 19.  He says he did have a few drinks but has not drunk and the hematoma which he had for a Seth Mcintosh, KEHOE. ([id number]) couple of months just burst open and caused the big bleed and extrusion of clots. 01/24/16: pt returns today for ongoing evaluation and management of a chronic right lower leg wound. Not seen here since 01/03/16. "Vacation in [place] with my daughter and granddaughter." he denies systemic s/s of infection. no new wounds or skin breakdown. wound is improving. Objective Constitutional Pulse regular. Respirations normal and unlabored. Afebrile. Vitals Time Taken: 10:58 AM, Height: 74 in, Weight: 265 lbs, BMI: 34, Temperature: 97.9 F, Pulse: 60 bpm, Respiratory Rate: 18 breaths/min, Blood Pressure: 122/71 mmHg. Eyes Nonicteric. Reactive to light. Ears, Nose, Mouth, and Throat Lips, teeth, and gums WNL.Marland Kitchen Moist mucosa without lesions. Neck supple and nontender. No palpable supraclavicular or cervical adenopathy. Normal sized without goiter. Respiratory WNL. No retractions.. Cardiovascular Pedal Pulses WNL. No clubbing, cyanosis or edema. Lymphatic No adneopathy. No adenopathy. No adenopathy. Musculoskeletal Adexa without tenderness or enlargement.. Digits and nails w/o clubbing, cyanosis, infection, petechiae, ischemia, or inflammatory conditions.Marland Kitchen Psychiatric Judgement and insight Intact.. No evidence of depression, anxiety, or agitation.. General Notes: wound had some macerated and at the edges and I was able to gently clean this out and the depth of the wound is  still present. Integumentary (Hair, Skin) Seth Mcintosh, Seth E. ([id number]) No suspicious lesions. No crepitus or fluctuance. No peri-wound warmth or erythema. No masses.. Wound #1 status is Open. Original cause of wound was Trauma. The wound is located on the Right,Lateral Lower Leg. The wound measures 0.7cm length x 0.2cm width x 0.2cm depth; 0.11cm^2 area and 0.022cm^3 volume. The wound is limited to skin breakdown. There is no undermining noted, however, there is tunneling at 6:00 with a maximum distance of 1.5cm. There is a large amount of serosanguineous drainage noted. The wound margin is thickened. There is large (67-100%) red granulation within the wound bed. There is a small (1-33%) amount of necrotic tissue within the wound bed including Adherent Slough. The periwound skin appearance exhibited: Induration, Erythema. The periwound skin appearance did not exhibit: Callus, Crepitus, Excoriation, Fluctuance, Friable, Localized Edema, Rash, Scarring, Dry/Scaly, Maceration, Moist, Atrophie Blanche, Cyanosis, Ecchymosis, Hemosiderin Staining, Mottled, Pallor, Rubor. The surrounding wound skin color is noted with erythema. Periwound temperature was noted as No Abnormality. The periwound has tenderness on palpation. 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 Anesthetic: Wound #1 Right,Lateral Lower Leg: Topical Lidocaine 4% cream applied to wound bed prior to debridement Skin Barriers/Peri-Wound Care: Wound #1 Right,Lateral Lower Leg: Skin Prep Primary Wound Dressing: Wound #1 Right,Lateral Lower Leg: Prisma Ag -  moisten with saline Secondary Dressing: Wound #1 Right,Lateral Lower Leg: Non-adherent pad - telfa island dressing Dressing Change Frequency: Seth Mcintosh, Seth Mcintosh ([id number]) Wound #1 Right,Lateral Lower Leg: Change dressing every other day. Follow-up Appointments: Wound #1 Right,Lateral Lower Leg: Return Appointment in 2 weeks. Edema Control: Wound #1 Right,Lateral Lower Leg: Elevate legs to the level of the heart and pump ankles as often as possible Support Garment 20-30 mm/Hg pressure to: - wear compression stockings Additional Orders / Instructions: Wound #1 Right,Lateral Lower  Leg: Increase protein intake. Medications-please add to medication list.: Wound #1 Right,Lateral Lower Leg: Other: - Vitamin C, Vitamin A, Zinc, MVI I have recommended we lightly packed this wound with Prisma AG and he can do this daily. Elevation, exercise and using his compression stockings have been discussed with him. He will see me back nextweek. Electronic Signature(s) Signed: 02/14/2016 12:11:01 PM By: Christin Fudge MD, FACS Entered By: Christin Fudge on 02/14/2016 12:11:01 Seth Mcintosh ([id number]) -------------------------------------------------------------------------------- [place] Details Patient Name: Seth Mcintosh Date of Service: 02/14/2016 Medical Record Number: [id number] Patient Account Number: [id number] Date of Birth/Sex: 27-Oct-1940 (75 y.o. Male) Treating RN: Ahmed Prima Primary Care Physician: [facility], [place] Other Clinician: Referring Physician: [facility], [place] Treating Physician/Extender: Frann Rider in Treatment: 21 Diagnosis Coding ICD-10 Codes Code Description E11.622 Type 2 diabetes mellitus with other skin ulcer L97.213 Non-pressure chronic ulcer of right calf with necrosis of muscle M70.861 Other soft tissue disorders related to use, overuse and pressure, right lower leg Facility Procedures CPT4 Code: [id number] Description: 99213 - WOUND CARE VISIT-LEV 3  EST PT Modifier: Quantity: 1 Physician Procedures CPT4: Description Modifier Quantity Code [id number] - WC PHYS LEVEL 3 - EST PT 1 ICD-10 Description Diagnosis E11.622 Type 2 diabetes mellitus with other skin ulcer L97.213 Non-pressure chronic ulcer of right calf with necrosis of muscle M70.861 Other  soft tissue disorders related to use, overuse and pressure, right lower leg Electronic Signature(s) Signed: 02/14/2016 4:28:25 PM By: Christin Fudge MD, FACS Signed: 02/14/2016 4:58:41 PM By: Alric Quan Previous Signature: 02/14/2016 12:11:21 PM Version By: Christin Fudge MD, FACS Entered By: Alric Quan on 02/14/2016 14:54:12

## 2016-02-15 NOTE — Progress Notes (Signed)
Seth Mcintosh, Seth Mcintosh ([id number]) Visit Report for 02/14/2016 Arrival Information Details Patient Name: Seth Mcintosh, Seth Mcintosh Date of Service: 02/14/2016 10:45 AM Medical Record Number: [id number] Patient Account Number: [id number] Date of Birth/Sex: 03-29-1941 (75 y.o. Male) Treating RN: Ahmed Prima Primary Care Physician: [facility], Dossie Der Other Clinician: Referring Physician: [facility], Dossie Der Treating Physician/Extender: Frann Rider in Treatment: 21 Visit Information History Since Last Visit All ordered tests and consults were completed: No Patient Arrived: Ambulatory Added or deleted any medications: No Arrival Time: 10:58 Any new allergies or adverse reactions: No Accompanied By: SELF Had a fall or experienced change in No Transfer Assistance: None activities of daily living that may affect Patient Identification Verified: Yes risk of falls: Secondary Verification Process Yes Signs or symptoms of abuse/neglect since last No Completed: visito Patient Requires Transmission-Based No Hospitalized since last visit: No Precautions: Pain Present Now: No Patient Has Alerts: Yes Patient Alerts: DM II Electronic Signature(s) Signed: 02/14/2016 4:58:41 PM By: Alric Quan Entered By: Alric Quan on 02/14/2016 10:58:40 Seth Mcintosh ([id number]) -------------------------------------------------------------------------------- Clinic Level of Care Assessment Details Patient Name: Seth Mcintosh Date of Service: 02/14/2016 10:45 AM Medical Record Number: [id number] Patient Account Number: [id number] Date of Birth/Sex: [birth year] (75 y.o. Male) Treating RN: Ahmed Prima Primary Care Physician: [facility], [place] Der Other Clinician: Referring Physician: [facility], [facility] Treating Physician/Extender: Frann Rider in Treatment: 21 Clinic Level of Care Assessment Items TOOL 4 Quantity Score X - Use when only an EandM is performed on FOLLOW-UP visit 1 0 ASSESSMENTS - Nursing  Assessment / Reassessment X - Reassessment of Co-morbidities (includes updates in patient status) 1 10 X - Reassessment of Adherence to Treatment Plan 1 5 ASSESSMENTS - Wound and Skin Assessment / Reassessment X - Simple Wound Assessment / Reassessment - one wound 1 5 []  - Complex Wound Assessment / Reassessment - multiple wounds 0 []  - Dermatologic / Skin Assessment (not related to wound area) 0 ASSESSMENTS - Focused Assessment []  - Circumferential Edema Measurements - multi extremities 0 []  - Nutritional Assessment / Counseling / Intervention 0 []  - Lower Extremity Assessment (monofilament, tuning fork, pulses) 0 []  - Peripheral Arterial Disease Assessment (using hand held doppler) 0 ASSESSMENTS - Ostomy and/or Continence Assessment and Care []  - Incontinence Assessment and Management 0 []  - Ostomy Care Assessment and Management (repouching, etc.) 0 PROCESS - Coordination of Care X - Simple Patient / Family Education for ongoing care 1 15 []  - Complex (extensive) Patient / Family Education for ongoing care 0 X - Staff obtains Programmer, systems, Records, Test Results / Process Orders 1 10 []  - Staff telephones HHA, Nursing Homes / Clarify orders / etc 0 []  - Routine Transfer to another Facility (non-emergent condition) 0 Seth Mcintosh, Seth Mcintosh ([id number]) []  - Routine Hospital Admission (non-emergent condition) 0 []  - New Admissions / Biomedical engineer / Ordering NPWT, Apligraf, etc. 0 []  - Emergency Hospital Admission (emergent condition) 0 X - Simple Discharge Coordination 1 10 []  - Complex (extensive) Discharge Coordination 0 PROCESS - Special Needs []  - Pediatric / Minor Patient Management 0 []  - Isolation Patient Management 0 []  - Hearing / Language / Visual special needs 0 []  - Assessment of Community assistance (transportation, D/C planning, etc.) 0 []  - Additional assistance / Altered mentation 0 []  - Support Surface(s) Assessment (bed, cushion, seat, etc.) 0 INTERVENTIONS - Wound  Cleansing / Measurement X - Simple Wound Cleansing - one wound 1 5 []  - Complex Wound Cleansing - multiple wounds 0 X - Wound Imaging (photographs - any number  of wounds) 1 5 []  - Wound Tracing (instead of photographs) 0 []  - Simple Wound Measurement - one wound 0 X - Complex Wound Measurement - multiple wounds 1 5 INTERVENTIONS - Wound Dressings X - Small Wound Dressing one or multiple wounds 1 10 []  - Medium Wound Dressing one or multiple wounds 0 []  - Large Wound Dressing one or multiple wounds 0 X - Application of Medications - topical 1 5 []  - Application of Medications - injection 0 INTERVENTIONS - Miscellaneous []  - External ear exam 0 Seth Mcintosh, Seth Mcintosh ([id number]) []  - Specimen Collection (cultures, biopsies, blood, body fluids, etc.) 0 []  - Specimen(s) / Culture(s) sent or taken to Lab for analysis 0 []  - Patient Transfer (multiple staff / Harrel Lemon Lift / Similar devices) 0 []  - Simple Staple / Suture removal (25 or less) 0 []  - Complex Staple / Suture removal (26 or more) 0 []  - Hypo / Hyperglycemic Management (close monitor of Blood Glucose) 0 []  - Ankle / Brachial Index (ABI) - do not check if billed separately 0 X - Vital Signs 1 5 Has the patient been seen at the hospital within the last three years: Yes Total Score: 90 Level Of Care: New/Established - Level 3 Electronic Signature(s) Signed: 02/14/2016 4:58:41 PM By: Alric Quan Entered By: Alric Quan on 02/14/2016 14:53:50 Seth Mcintosh ([id number]) -------------------------------------------------------------------------------- Encounter Discharge Information Details Patient Name: Seth Mcintosh Date of Service: 02/14/2016 10:45 AM Medical Record Number: [id number] Patient Account Number: [id number] Date of Birth/Sex: [birth year] (75 y.o. Male) Treating RN: Ahmed Prima Primary Care Physician: [facility], [place]ossie Der Other Clinician: Referring Physician: [facility], [facility] Treating Physician/Extender: Frann Rider in Treatment: 21 Encounter Discharge Information Items Discharge Pain Level: 0 Discharge Condition: Stable Ambulatory Status: Ambulatory Discharge Destination: Home Transportation: Private Auto Accompanied By: self Schedule Follow-up Appointment: Yes Medication Reconciliation completed and provided to Patient/Care Yes Saige [place]: Provided on Clinical Summary of Care: 02/14/2016 Form Type Recipient Paper Patient JK Electronic Signature(s) Signed: 02/14/2016 11:25:19 AM By: Ruthine Dose Entered By: Ruthine Dose on 02/14/2016 11:25:19 Seth Mcintosh ([id number]) -------------------------------------------------------------------------------- Lower Extremity Assessment Details Patient Name: Seth Mcintosh Date of Service: 02/14/2016 10:45 AM Medical Record Number: [id number] Patient Account Number: [id number] Date of Birth/Sex: 1941-03-10 (75 y.o. Male) Treating RN: Ahmed Prima Primary Care Physician: [facility], [place]ossie Der Other Clinician: Referring Physician: [facility], [facility] Treating Physician/Extender: Frann Rider in Treatment: 21 Vascular Assessment Pulses: Posterior Tibial Dorsalis Pedis Palpable: [Right:Yes] Extremity colors, hair growth, and conditions: Extremity Color: [Right:Normal] Temperature of Extremity: [Right:Warm] Capillary Refill: [Right:< 3 seconds] Toe Nail Assessment Left: Right: Thick: No Discolored: No Deformed: No Improper Length and Hygiene: No Electronic Signature(s) Signed: 02/14/2016 4:58:41 PM By: Alric Quan Entered By: Alric Quan on 02/14/2016 10:59:37 Seth Mcintosh ([id number]) -------------------------------------------------------------------------------- Multi Wound Chart Details Patient Name: Seth Mcintosh Date of Service: 02/14/2016 10:45 AM Medical Record Number: [id number] Patient Account Number: [id number] Date of Birth/Sex: 27-Jul-1940 (75 y.o. Male) Treating RN: Ahmed Prima Primary Care  Physician: [facility], Dossie Der Other Clinician: Referring Physician: [facility], [facility] Treating Physician/Extender: Frann Rider in Treatment: 21 Vital Signs Height(in): 74 Pulse(bpm): 60 Weight(lbs): 265 Blood Pressure 122/71 (mmHg): Body Mass Index(BMI): 34 Temperature(F): 97.9 Respiratory Rate 18 (breaths/min): Photos: [1:No Photos] [N/A:N/A] Wound Location: [1:Right Lower Leg - Lateral] [N/A:N/A] Wounding Event: [1:Trauma] [N/A:N/A] Primary Etiology: [1:Diabetic Wound/Ulcer of the Lower Extremity] [N/A:N/A] Comorbid History: [1:Glaucoma, Sleep Apnea, Coronary Artery Disease, Hypertension, Type II Diabetes, Osteoarthritis] [N/A:N/A] Date Acquired: [1:08/07/2015] [N/A:N/A] Weeks of Treatment: [1:21] [N/A:N/A] Wound Status: [  1:Open] [N/A:N/A] Measurements L x W x D 0.7x0.2x0.2 [N/A:N/A] (cm) Area (cm) : [1:0.11] [N/A:N/A] Volume (cm) : [1:0.022] [N/A:N/A] % Reduction in Area: [1:97.20%] [N/A:N/A] % Reduction in Volume: 99.20% [N/A:N/A] Position 1 (o'clock): 6 Maximum Distance 1 1.5 (cm): Tunneling: [1:Yes] [N/A:N/A] Classification: [1:Grade 1] [N/A:N/A] Exudate Amount: [1:Large] [N/A:N/A] Exudate Type: [1:Serosanguineous] [N/A:N/A] Exudate Color: [1:red, brown] [N/A:N/A] Wound Margin: [1:Thickened] [N/A:N/A] Granulation Amount: [1:Large (67-100%)] [N/A:N/A] Granulation Quality: [1:Red] [N/A:N/A] Necrotic Amount: [1:Small (1-33%)] [N/A:N/A] Exposed Structures: Fascia: No N/A N/A Fat: No Tendon: No Muscle: No Joint: No Bone: No Limited to Skin Breakdown Epithelialization: None N/A N/A Periwound Skin Texture: Induration: Yes N/A N/A Edema: No Excoriation: No Callus: No Crepitus: No Fluctuance: No Friable: No Rash: No Scarring: No Periwound Skin Maceration: No N/A N/A Moisture: Moist: No Dry/Scaly: No Periwound Skin Color: Erythema: Yes N/A N/A Atrophie Blanche: No Cyanosis: No Ecchymosis: No Hemosiderin Staining: No Mottled: No Pallor: No Rubor:  No Temperature: No Abnormality N/A N/A Tenderness on Yes N/A N/A Palpation: Wound Preparation: Ulcer Cleansing: N/A N/A Rinsed/Irrigated with Saline Topical Anesthetic Applied: Other: lidocaine 4% Treatment Notes Electronic Signature(s) Signed: 02/14/2016 4:58:41 PM By: Alric Quan Entered By: Alric Quan on 02/14/2016 11:12:42 Seth Mcintosh ([id number]) -------------------------------------------------------------------------------- Multi-Disciplinary Care Plan Details Patient Name: Seth Mcintosh Date of Service: 02/14/2016 10:45 AM Medical Record Number: [id number] Patient Account Number: [id number] Date of Birth/Sex: [birth year] (75 y.o. Male) Treating RN: Ahmed Prima Primary Care Physician: [facility], Dossie Der Other Clinician: Referring Physician: [facility], [place]ossie Der Treating Physician/Extender: Frann Rider in Treatment: 21 Active Inactive Abuse / Safety / Falls / Self Care Management Nursing Diagnoses: Potential for falls Goals: Patient will remain injury free Date Initiated: 09/16/2015 Goal Status: Active Interventions: Assess fall risk on admission and as needed Notes: Orientation to the Wound Care Program Nursing Diagnoses: Knowledge deficit related to the wound healing center program Goals: Patient/caregiver will verbalize understanding of the [place] Program Date Initiated: 09/16/2015 Goal Status: Active Interventions: Provide education on orientation to the wound center Notes: Pain, Acute or Chronic Nursing Diagnoses: Pain, acute or chronic: actual or potential Potential alteration in comfort, pain Goals: Seth Mcintosh, Seth Mcintosh ([id number]) Patient will verbalize adequate pain control and receive pain control interventions during procedures as needed Date Initiated: 09/16/2015 Goal Status: Active Interventions: Assess comfort goal upon admission Complete pain assessment as per visit requirements Notes: Soft Tissue  Infection Nursing Diagnoses: Impaired tissue integrity Goals: Patient will remain free of wound infection Date Initiated: 09/16/2015 Goal Status: Active Interventions: Assess signs and symptoms of infection every visit Notes: Wound/Skin Impairment Nursing Diagnoses: Impaired tissue integrity Goals: Ulcer/skin breakdown will have a volume reduction of 30% by week 4 Date Initiated: 09/16/2015 Goal Status: Active Ulcer/skin breakdown will have a volume reduction of 50% by week 8 Date Initiated: 09/16/2015 Goal Status: Active Ulcer/skin breakdown will have a volume reduction of 80% by week 12 Date Initiated: 09/16/2015 Goal Status: Active Interventions: Assess patient/caregiver ability to obtain necessary supplies Assess ulceration(s) every visit Notes: Seth Mcintosh, Seth Mcintosh ([id number]) Electronic Signature(s) Signed: 02/14/2016 4:58:41 PM By: Alric Quan Entered By: Alric Quan on 02/14/2016 11:10:18 Seth Mcintosh ([id number]) -------------------------------------------------------------------------------- Pain Assessment Details Patient Name: Seth Mcintosh Date of Service: 02/14/2016 10:45 AM Medical Record Number: [id number] Patient Account Number: [id number] Date of Birth/Sex: 1940-07-26 (75 y.o. Male) Treating RN: Ahmed Prima Primary Care Physician: [facility], Dossie Der Other Clinician: Referring Physician: [facility], Dossie Der Treating Physician/Extender: Frann Rider in Treatment: 21 Active Problems Location of Pain Severity and Description of Pain Patient  Has Paino No Site Locations With Dressing Change: No Pain Management and Medication Current Pain Management: Electronic Signature(s) Signed: 02/14/2016 4:58:41 PM By: Alric Quan Entered By: Alric Quan on 02/14/2016 10:58:47 Seth Mcintosh ([id number]) -------------------------------------------------------------------------------- Patient/Caregiver Education Details Patient Name: Seth Mcintosh Date of Service: 02/14/2016 10:45 AM Medical Record Number: [id number] Patient Account Number: [id number] Date of Birth/Gender: [birth year] (75 y.o. Male) Treating RN: Ahmed Prima Primary Care Physician: [facility], [place]ossie Der Other Clinician: Referring Physician: [facility], [facility] Treating Physician/Extender: Frann Rider in Treatment: 21 Education Assessment Education Provided To: Patient Education Topics Provided Wound/Skin Impairment: Handouts: Other: change dressing as ordered Methods: Demonstration, Explain/Verbal Responses: State content correctly Electronic Signature(s) Signed: 02/14/2016 4:58:41 PM By: Alric Quan Entered By: Alric Quan on 02/14/2016 11:16:48 Seth Mcintosh ([id number]) -------------------------------------------------------------------------------- Wound Assessment Details Patient Name: Seth Mcintosh Date of Service: 02/14/2016 10:45 AM Medical Record Number: [id number] Patient Account Number: [id number] Date of Birth/Sex: 1940-09-01 (75 y.o. Male) Treating RN: Ahmed Prima Primary Care Physician: [facility], Dossie Der Other Clinician: Referring Physician: [facility], [facility] Treating Physician/Extender: Frann Rider in Treatment: 21 Wound Status Wound Number: 1 Primary Diabetic Wound/Ulcer of the Lower Etiology: Extremity Wound Location: Right Lower Leg - Lateral Wound Open Wounding Event: Trauma Status: Date Acquired: 08/07/2015 Comorbid Glaucoma, Sleep Apnea, Coronary Weeks Of Treatment: 21 History: Artery Disease, Hypertension, Type II Clustered Wound: No Diabetes, Osteoarthritis Photos Photo Uploaded By: Alric Quan on 02/14/2016 11:37:33 Wound Measurements Length: (cm) 0.7 Width: (cm) 0.2 Depth: (cm) 0.2 Area: (cm) 0.11 Volume: (cm) 0.022 % Reduction in Area: 97.2% % Reduction in Volume: 99.2% Epithelialization: None Tunneling: Yes Position (o'clock): 6 Maximum Distance: (cm) 1.5 Undermining: No Wound  Description Classification: Grade 1 Foul Odor Aft Wound Margin: Thickened Exudate Amount: Large Exudate Type: Serosanguineous Exudate Color: red, brown er Cleansing: No Wound Bed Granulation Amount: Large (67-100%) Exposed Structure Seth Mcintosh, Seth Mcintosh ([id number]) Granulation Quality: Red Fascia Exposed: No Necrotic Amount: Small (1-33%) Fat Layer Exposed: No Necrotic Quality: Adherent Slough Tendon Exposed: No Muscle Exposed: No Joint Exposed: No Bone Exposed: No Limited to Skin Breakdown Periwound Skin Texture Texture Color No Abnormalities Noted: No No Abnormalities Noted: No Callus: No Atrophie Blanche: No Crepitus: No Cyanosis: No Excoriation: No Ecchymosis: No Fluctuance: No Erythema: Yes Friable: No Hemosiderin Staining: No Induration: Yes Mottled: No Localized Edema: No Pallor: No Rash: No Rubor: No Scarring: No Temperature / Pain Moisture Temperature: No Abnormality No Abnormalities Noted: No Tenderness on Palpation: Yes Dry / Scaly: No Maceration: No Moist: No Wound Preparation Ulcer Cleansing: Rinsed/Irrigated with Saline Topical Anesthetic Applied: Other: lidocaine 4%, Treatment Notes Wound #1 (Right, Lateral Lower Leg) 1. Cleansed with: Clean wound with Normal Saline 2. Anesthetic Topical Lidocaine 4% cream to wound bed prior to debridement 3. Peri-wound Care: Skin Prep 4. Dressing Applied: Prisma Ag 5. Secondary Dressing Applied Dry [place] Signature(s) Signed: 02/14/2016 4:58:41 PM By: Charlton Haws ([id number]) Entered By: Alric Quan on 02/14/2016 11:04:23 Seth Mcintosh ([id number]) -------------------------------------------------------------------------------- [place] Details Patient Name: Seth Mcintosh Date of Service: 02/14/2016 10:45 AM Medical Record Number: [id number] Patient Account Number: [id number] Date of Birth/Sex: 01-13-1941 (75 y.o. Male) Treating RN: Carolyne Fiscal,  Debi Primary Care Physician: [facility], [place] Other Clinician: Referring Physician: [facility], [facility] Treating Physician/Extender: Frann Rider in Treatment: 21 Vital Signs Time Taken: 10:58 Temperature (F): 97.9 Height (in): 74 Pulse (bpm): 60 Weight (lbs): 265 Respiratory Rate (breaths/min): 18 Body Mass Index (BMI): 34 Blood Pressure (mmHg): 122/71 Reference Range: 80 - 120  mg / dl Electronic Signature(s) Signed: 02/14/2016 4:58:41 PM By: Alric Quan Entered By: Alric Quan on 02/14/2016 10:59:09

## 2016-02-15 NOTE — Telephone Encounter (Signed)
Pt has an appt on 02/18/2016

## 2016-02-18 ENCOUNTER — Ambulatory Visit (INDEPENDENT_AMBULATORY_CARE_PROVIDER_SITE_OTHER): Payer: Medicare Other | Admitting: Family Medicine

## 2016-02-18 ENCOUNTER — Encounter: Payer: Self-pay | Admitting: Family Medicine

## 2016-02-18 VITALS — BP 124/70 | HR 74 | Temp 97.4°F | Resp 16 | Ht 74.0 in | Wt 265.5 lb

## 2016-02-18 DIAGNOSIS — F411 Generalized anxiety disorder: Secondary | ICD-10-CM | POA: Diagnosis not present

## 2016-02-18 DIAGNOSIS — I10 Essential (primary) hypertension: Secondary | ICD-10-CM

## 2016-02-18 DIAGNOSIS — E119 Type 2 diabetes mellitus without complications: Secondary | ICD-10-CM

## 2016-02-18 LAB — GLUCOSE, POCT (MANUAL RESULT ENTRY): POC Glucose: 143 mg/dl — AB (ref 70–99)

## 2016-02-18 LAB — POCT GLYCOSYLATED HEMOGLOBIN (HGB A1C): HEMOGLOBIN A1C: 6.3

## 2016-02-18 MED ORDER — CLONAZEPAM 0.5 MG PO TABS: 0.5000 mg | ORAL_TABLET | Freq: Two times a day (BID) | ORAL | 0 refills | Status: AC | PRN

## 2016-02-18 MED ORDER — CLONAZEPAM 0.5 MG PO TABS
0.5000 mg | ORAL_TABLET | Freq: Two times a day (BID) | ORAL | 0 refills | Status: DC | PRN
Start: 2016-02-18 — End: 2016-02-18

## 2016-02-18 MED ORDER — BUSPIRONE HCL 15 MG PO TABS: 15.0000 mg | ORAL_TABLET | Freq: Two times a day (BID) | ORAL | 0 refills | Status: AC

## 2016-02-18 NOTE — Progress Notes (Signed)
Name: Seth EILTS Sr.   MRN: [id number]    DOB: [birth year]   Date:02/18/2016       Progress Note  Subjective  Chief Complaint  Chief Complaint  Patient presents with  . Follow-up    medication refills    Diabetes  He presents for his follow-up diabetic visit. He has type 2 diabetes mellitus. His disease course has been stable. There are no hypoglycemic associated symptoms. Pertinent negatives for hypoglycemia include no headaches or nervousness/anxiousness. Associated symptoms include polyuria. Pertinent negatives for diabetes include no blurred vision, no chest pain, no fatigue, no polydipsia and no weight loss. Symptoms are stable. Pertinent negatives for diabetic complications include no CVA, heart disease or impotence. Current diabetic treatment includes oral agent (monotherapy). He is following a diabetic diet. He participates in exercise three times a week. His breakfast blood glucose range is generally 130-140 mg/dl. An ACE inhibitor/angiotensin II receptor blocker is being taken. Eye exam is current.  Anxiety   Presents for follow-up visit. Symptoms include depressed mood, excessive worry, insomnia and irritability. Patient reports no chest pain, impotence, nausea, nervous/anxious behavior, palpitations, panic or shortness of breath. Symptoms occur most days. The severity of symptoms is moderate and causing significant distress.    Hypertension  This is a chronic problem. The problem is unchanged. The problem is controlled. Pertinent negatives include no blurred vision, chest pain, headaches, palpitations or shortness of breath. Past treatments include angiotensin blockers. Hypertensive end-organ damage includes CAD/MI. There is no history of kidney disease or CVA.   Past Medical History:  Diagnosis Date  . Arthritis    rheumatoid   . Coronary artery disease   . Depression   . Diabetes mellitus without complication ([place])   . Diverticulosis   . GERD (gastroesophageal reflux  disease)   . Hypertension   . Leg lesion    RIGHT WOUND CARE  . Localized osteoarthritis of right knee 05/21/2012  . Reflux   . Sleep apnea    CPAP  . Sleeping difficulty    takes med nightly  . Urgency of urination     Past Surgical History:  Procedure Laterality Date  . BACK SURGERY    . CATARACT EXTRACTION W/PHACO Right 12/16/2015   Procedure: CATARACT EXTRACTION PHACO AND INTRAOCULAR LENS PLACEMENT ([facility]);  Surgeon: Eulogio Bear, MD;  Location: [facility] OR[facility];  Service: Ophthalmology;  Laterality: Right;  [place] 01:16 AP% 8.7 CDE 6.66 fluid pack lot # [id number]  . CATARACT EXTRACTION W/PHACO Left 02/03/2016   Procedure: CATARACT EXTRACTION PHACO AND INTRAOCULAR LENS PLACEMENT ([place]);  Surgeon: Eulogio Bear, MD;  Location: [facility];  Service: Ophthalmology;  Laterality: Left;  Lot# [id number] H [place]: 00:52.3 AP%: 13.8 CDE: 7.21  . CORONARY ANGIOPLASTY     STENT  . CORONARY ARTERY BYPASS GRAFT  2004   x4 vessels  . FOOT SURGERY     x2  . FOOT SURGERY     x2  . JOINT REPLACEMENT    . TOTAL KNEE ARTHROPLASTY  05/21/2012   Procedure: TOTAL KNEE ARTHROPLASTY;  Surgeon: Johnny Bridge, MD;  Location: [facility];  Service: Orthopedics;  Laterality: Right;  . TRANSURETHRAL RESECTION OF PROSTATE      Family History  Problem Relation Age of Onset  . Bladder Cancer Brother   . Kidney disease Neg Hx   . Prostate cancer Neg Hx     Social History   Social History  . Marital status: Widowed    Spouse name: N/A  . Number of  children: N/A  . Years of education: N/A   Occupational History  . Not on file.   Social History Main Topics  . Smoking status: Former Smoker    Quit date: 05/14/1992  . Smokeless tobacco: Never Used  . Alcohol use 0.0 oz/week     Comment: moderatly  . Drug use: No  . Sexual activity: Not on file   Other Topics Concern  . Not on file   Social History Narrative  . No narrative on file     Current Outpatient Prescriptions:  .  Ascorbic Acid (VITAMIN C)  1000 MG tablet, Take 2,000 mg by mouth daily., Disp: , Rfl:  .  aspirin EC 81 MG tablet, Take 81 mg by mouth daily., Disp: , Rfl:  .  buPROPion (WELLBUTRIN XL) 300 MG 24 hr tablet, TAKE 1 TABLET DAILY, Disp: 90 tablet, Rfl: 3 .  busPIRone (BUSPAR) 15 MG tablet, Take 15 mg by mouth every morning., Disp: , Rfl:  .  Cholecalciferol (VITAMIN D3) 5000 units CAPS, Take 1 capsule by mouth daily., Disp: , Rfl:  .  clonazePAM (KLONOPIN) 0.5 MG tablet, Take 1 tablet (0.5 mg total) by mouth 2 (two) times daily as needed. anxiety, Disp: 180 tablet, Rfl: 0 .  Coenzyme Q10 (CO Q 10) 100 MG CAPS, Take 1 capsule by mouth daily., Disp: , Rfl:  .  fish oil-omega-3 fatty acids 1000 MG capsule, Take 2 g by mouth daily., Disp: , Rfl:  .  fluticasone (FLONASE) 50 MCG/ACT nasal spray, Place 2 sprays into both nostrils daily., Disp: 16 g, Rfl: 1 .  irbesartan (AVAPRO) 150 MG tablet, Take 1 tablet (150 mg total) by mouth daily. (Patient taking differently: Take 150 mg by mouth at bedtime. ), Disp: 90 tablet, Rfl: 0 .  metFORMIN (GLUCOPHAGE) 1000 MG tablet, TAKE 1 TABLET TWICE A DAY WITH MEALS, Disp: 180 tablet, Rfl: 3 .  Multiple Vitamin (MULTIVITAMIN WITH MINERALS) TABS, Take 1 tablet by mouth daily., Disp: , Rfl:  .  Multiple Vitamins-Calcium (CALCI-MAX PO), Take 1 tablet by mouth daily. Reported on 06/02/2015, Disp: , Rfl:  .  pantoprazole (PROTONIX) 40 MG tablet, Take 1 tablet (40 mg total) by mouth daily. (Patient taking differently: Take 20 mg by mouth daily. ), Disp: 90 tablet, Rfl: 0 .  Pumpkin Seed-Soy Germ (AZO BLADDER CONTROL/GO-LESS PO), Take 2 capsules by mouth 3 (three) times daily., Disp: , Rfl:  .  sildenafil (VIAGRA) 50 MG tablet, Take 1 tablet (50 mg total) by mouth daily as needed for erectile dysfunction. 1 tablet po 30 mins prior to sexual activity, Disp: 10 tablet, Rfl: 0 .  solifenacin (VESICARE) 10 MG tablet, Take 1 tablet (10 mg total) by mouth daily., Disp: 90 tablet, Rfl: 3 .  Suvorexant (BELSOMRA)  10 MG TABS, Take 1 tablet by mouth at bedtime as needed., Disp: 90 tablet, Rfl: 0 .  VYTORIN 10-20 MG tablet, TAKE 1 TABLET DAILY AT 6 P.M, Disp: 90 tablet, Rfl: 3  No Known Allergies   Review of Systems  Constitutional: Positive for irritability. Negative for fatigue and weight loss.  Eyes: Negative for blurred vision.  Respiratory: Negative for shortness of breath.   Cardiovascular: Negative for chest pain and palpitations.  Gastrointestinal: Negative for nausea.  Genitourinary: Negative for impotence.  Neurological: Negative for headaches.  Endo/Heme/Allergies: Negative for polydipsia.  Psychiatric/Behavioral: The patient has insomnia. The patient is not nervous/anxious.     Objective  Vitals:   02/18/16 1017  BP: 124/70  Pulse: 74  Resp: 16  Temp: 97.4 F (36.3 C)  TempSrc: Oral  SpO2: 96%  Weight: 265 lb 8 oz (120.4 kg)  Height: 6\' 2"  (1.88 m)    Physical Exam  Constitutional: He is oriented to person, place, and time and well-developed, well-nourished, and in no distress.  HENT:  Head: Normocephalic and atraumatic.  Cardiovascular: Normal rate, regular rhythm, S1 normal and S2 normal.   No murmur heard. Pulmonary/Chest: Effort normal and breath sounds normal. He has no wheezes.  Abdominal: Soft. There is no tenderness.  Musculoskeletal:       Right ankle: He exhibits swelling.       Left ankle: He exhibits swelling.  1+ pitting edema bilateral lower extremities.  Neurological: He is alert and oriented to person, place, and time.  Psychiatric: Mood, memory, affect and judgment normal.  Nursing note and vitals reviewed.     Recent Results (from the past 2160 hour(s))  Glucose, capillary     Status: Abnormal   Collection Time: 12/16/15  6:09 AM  Result Value Ref Range   Glucose-Capillary 159 (H) 65 - 99 mg/dL  Glucose, capillary     Status: Abnormal   Collection Time: 02/03/16  8:12 AM  Result Value Ref Range   Glucose-Capillary 159 (H) 65 - 99 mg/dL      Assessment & Plan  1. Generalized anxiety disorder Changed Buspirone to 15 mg twice daily, she reports that he has been taking BuSpar twice daily although in our medication history he was on once daily. Continue clonazepam as needed. Refills provided and follow-up in 3 months - busPIRone (BUSPAR) 15 MG tablet; Take 1 tablet (15 mg total) by mouth 2 (two) times daily.  Dispense: 180 tablet; Refill: 0 - clonazePAM (KLONOPIN) 0.5 MG tablet; Take 1 tablet (0.5 mg total) by mouth 2 (two) times daily as needed. anxiety  Dispense: 60 tablet; Refill: 0  2. Controlled type 2 diabetes mellitus without complication, without long-term current use of insulin (HCC) A1c 6.3%, glucose is 143 mg/dL, consistent with well-controlled diabetes. No change in pharmacotherapy - POCT HgB A1C - POCT Glucose (CBG)  3. Benign essential HTN BP stable and controlled on present anti-hypertensive therapy  Chalise Pe [facility] 02/18/2016 10:38 AM

## 2016-02-21 ENCOUNTER — Telehealth: Payer: Self-pay | Admitting: Family Medicine

## 2016-02-21 NOTE — Telephone Encounter (Signed)
Patient was seen on 02-18-16 and states that [facility] not received his prescriptions for viagra and clonazepam (requeting 90 day supply).

## 2016-02-28 ENCOUNTER — Ambulatory Visit: Payer: TRICARE For Life (TFL) | Admitting: Surgery

## 2016-03-02 ENCOUNTER — Encounter: Payer: Medicare Other | Attending: Surgery | Admitting: Surgery

## 2016-03-02 DIAGNOSIS — G4730 Sleep apnea, unspecified: Secondary | ICD-10-CM | POA: Diagnosis not present

## 2016-03-02 DIAGNOSIS — L97213 Non-pressure chronic ulcer of right calf with necrosis of muscle: Secondary | ICD-10-CM | POA: Insufficient documentation

## 2016-03-02 DIAGNOSIS — F329 Major depressive disorder, single episode, unspecified: Secondary | ICD-10-CM | POA: Insufficient documentation

## 2016-03-02 DIAGNOSIS — M70861 Other soft tissue disorders related to use, overuse and pressure, right lower leg: Secondary | ICD-10-CM | POA: Diagnosis not present

## 2016-03-02 DIAGNOSIS — K219 Gastro-esophageal reflux disease without esophagitis: Secondary | ICD-10-CM | POA: Insufficient documentation

## 2016-03-02 DIAGNOSIS — Z87891 Personal history of nicotine dependence: Secondary | ICD-10-CM | POA: Diagnosis not present

## 2016-03-02 DIAGNOSIS — I2510 Atherosclerotic heart disease of native coronary artery without angina pectoris: Secondary | ICD-10-CM | POA: Diagnosis not present

## 2016-03-02 DIAGNOSIS — E11622 Type 2 diabetes mellitus with other skin ulcer: Secondary | ICD-10-CM | POA: Diagnosis not present

## 2016-03-02 DIAGNOSIS — N400 Enlarged prostate without lower urinary tract symptoms: Secondary | ICD-10-CM | POA: Insufficient documentation

## 2016-03-02 DIAGNOSIS — I10 Essential (primary) hypertension: Secondary | ICD-10-CM | POA: Insufficient documentation

## 2016-03-02 DIAGNOSIS — M1990 Unspecified osteoarthritis, unspecified site: Secondary | ICD-10-CM | POA: Insufficient documentation

## 2016-03-02 DIAGNOSIS — S81801A Unspecified open wound, right lower leg, initial encounter: Secondary | ICD-10-CM | POA: Diagnosis not present

## 2016-03-03 NOTE — Progress Notes (Addendum)
BAVLY, WIENCKOWSKI ([id number]) Visit Report for 03/02/2016 Arrival Information Details Patient Name: Seth Mcintosh, Seth Mcintosh Date of Service: 03/02/2016 2:15 PM Medical Record Number: [id number] Patient Account Number: [id number] Date of Birth/Sex: [birth year] (75 y.o. Male) Treating RN: Baruch Gouty, RN, BSN, Velva Harman Primary Care Physician: [facility], Dossie Der Other Clinician: Referring Physician: Keith Rake Treating Physician/Extender: Frann Rider in Treatment: 24 Visit Information History Since Last Visit All ordered tests and consults were completed: No Patient Arrived: Ambulatory Added or deleted any medications: No Arrival Time: 14:23 Any new allergies or adverse reactions: No Accompanied By: self Had a fall or experienced change in No Transfer Assistance: None activities of daily living that may affect Patient Identification Verified: Yes risk of falls: Secondary Verification Process Yes Signs or symptoms of abuse/neglect since last No Completed: visito Patient Requires Transmission-Based No Hospitalized since last visit: No Precautions: Has Dressing in Place as Prescribed: Yes Patient Has Alerts: Yes Pain Present Now: No Patient Alerts: DM II Electronic Signature(s) Signed: 03/02/2016 2:25:14 PM By: Regan Lemming BSN, RN Entered By: Regan Lemming on 03/02/2016 14:25:14 Seth Mcintosh ([id number]) -------------------------------------------------------------------------------- Clinic Level of Care Assessment Details Patient Name: Seth Mcintosh Date of Service: 03/02/2016 2:15 PM Medical Record Number: [id number] Patient Account Number: [id number] Date of Birth/Sex: December 07, 1940 (75 y.o. Male) Treating RN: Baruch Gouty, RN, BSN, [place] Primary Care Physician: [facility], Dossie Der Other Clinician: Referring Physician: [facility], [facility] Treating Physician/Extender: Frann Rider in Treatment: 24 Clinic Level of Care Assessment Items TOOL 4 Quantity Score []  - Use when only an EandM is performed on  FOLLOW-UP visit 0 ASSESSMENTS - Nursing Assessment / Reassessment X - Reassessment of Co-morbidities (includes updates in patient status) 1 10 X - Reassessment of Adherence to Treatment Plan 1 5 ASSESSMENTS - Wound and Skin Assessment / Reassessment X - Simple Wound Assessment / Reassessment - one wound 1 5 []  - Complex Wound Assessment / Reassessment - multiple wounds 0 []  - Dermatologic / Skin Assessment (not related to wound area) 0 ASSESSMENTS - Focused Assessment []  - Circumferential Edema Measurements - multi extremities 0 []  - Nutritional Assessment / Counseling / Intervention 0 []  - Lower Extremity Assessment (monofilament, tuning fork, pulses) 0 []  - Peripheral Arterial Disease Assessment (using hand held doppler) 0 ASSESSMENTS - Ostomy and/or Continence Assessment and Care []  - Incontinence Assessment and Management 0 []  - Ostomy Care Assessment and Management (repouching, etc.) 0 PROCESS - Coordination of Care X - Simple Patient / Family Education for ongoing care 1 15 []  - Complex (extensive) Patient / Family Education for ongoing care 0 []  - Staff obtains Programmer, systems, Records, Test Results / Process Orders 0 []  - Staff telephones HHA, Nursing Homes / Clarify orders / etc 0 []  - Routine Transfer to another Facility (non-emergent condition) 0 Seth Mcintosh, Seth Mcintosh ([id number]) []  - Routine Hospital Admission (non-emergent condition) 0 []  - New Admissions / Biomedical engineer / Ordering NPWT, Apligraf, etc. 0 []  - Emergency Hospital Admission (emergent condition) 0 []  - Simple Discharge Coordination 0 []  - Complex (extensive) Discharge Coordination 0 PROCESS - Special Needs []  - Pediatric / Minor Patient Management 0 []  - Isolation Patient Management 0 []  - Hearing / Language / Visual special needs 0 []  - Assessment of Community assistance (transportation, D/C planning, etc.) 0 []  - Additional assistance / Altered mentation 0 []  - Support Surface(s) Assessment (bed, cushion,  seat, etc.) 0 INTERVENTIONS - Wound Cleansing / Measurement X - Simple Wound Cleansing - one wound 1 5 []  - Complex Wound Cleansing - multiple  wounds 0 X - Wound Imaging (photographs - any number of wounds) 1 5 []  - Wound Tracing (instead of photographs) 0 X - Simple Wound Measurement - one wound 1 5 []  - Complex Wound Measurement - multiple wounds 0 INTERVENTIONS - Wound Dressings X - Small Wound Dressing one or multiple wounds 1 10 []  - Medium Wound Dressing one or multiple wounds 0 []  - Large Wound Dressing one or multiple wounds 0 []  - Application of Medications - topical 0 []  - Application of Medications - injection 0 INTERVENTIONS - Miscellaneous []  - External ear exam 0 Seth Mcintosh, Seth Mcintosh ([id number]) []  - Specimen Collection (cultures, biopsies, blood, body fluids, etc.) 0 []  - Specimen(s) / Culture(s) sent or taken to Lab for analysis 0 []  - Patient Transfer (multiple staff / Harrel Lemon Lift / Similar devices) 0 []  - Simple Staple / Suture removal (25 or less) 0 []  - Complex Staple / Suture removal (26 or more) 0 []  - Hypo / Hyperglycemic Management (close monitor of Blood Glucose) 0 []  - Ankle / Brachial Index (ABI) - do not check if billed separately 0 X - Vital Signs 1 5 Has the patient been seen at the hospital within the last three years: Yes Total Score: 65 Level Of Care: New/Established - Level 2 Electronic Signature(s) Signed: 03/02/2016 5:10:52 PM By: Regan Lemming BSN, RN Entered By: Regan Lemming on 03/02/2016 14:39:04 Seth Mcintosh ([id number]) -------------------------------------------------------------------------------- Encounter Discharge Information Details Patient Name: Seth Mcintosh Date of Service: 03/02/2016 2:15 PM Medical Record Number: [id number] Patient Account Number: [id number] Date of Birth/Sex: 1941-03-08 (75 y.o. Male) Treating RN: Baruch Gouty, RN, BSN, [employer] Primary Care Physician: [facility], Dossie Der Other Clinician: Referring Physician: [facility], Dossie Der Treating  Physician/Extender: Frann Rider in Treatment: 24 Encounter Discharge Information Items Discharge Pain Level: 0 Discharge Condition: Stable Ambulatory Status: Ambulatory Discharge Destination: Home Transportation: Private Auto Accompanied By: self Schedule Follow-up Appointment: No Medication Reconciliation completed and provided to Patient/Care No Orianna Biskup: Provided on Clinical Summary of Care: 03/02/2016 Form Type Recipient Paper Patient JK Electronic Signature(s) Signed: 03/02/2016 2:49:35 PM By: Regan Lemming BSN, RN Previous Signature: 03/02/2016 2:43:12 PM Version By: Ruthine Dose Entered By: Regan Lemming on 03/02/2016 14:49:34 Seth Mcintosh ([id number]) -------------------------------------------------------------------------------- Lower Extremity Assessment Details Patient Name: Seth Mcintosh Date of Service: 03/02/2016 2:15 PM Medical Record Number: [id number] Patient Account Number: [id number] Date of Birth/Sex: 07/12/1940 (75 y.o. Male) Treating RN: Baruch Gouty, RN, BSN, [place] Primary Care Physician: [facility], Dossie Der Other Clinician: Referring Physician: [facility], [facility] Treating Physician/Extender: Frann Rider in Treatment: 24 Vascular Assessment Pulses: Posterior Tibial Extremity colors, hair growth, and conditions: Extremity Color: [Right:Mottled] Hair Growth on Extremity: [Right:Yes] Temperature of Extremity: [Right:Warm] Capillary Refill: [Right:< 3 seconds] Electronic Signature(s) Signed: 03/02/2016 5:10:52 PM By: Regan Lemming BSN, RN Entered By: Regan Lemming on 03/02/2016 14:31:22 Seth Mcintosh ([id number]) -------------------------------------------------------------------------------- Multi Wound Chart Details Patient Name: Seth Mcintosh Date of Service: 03/02/2016 2:15 PM Medical Record Number: [id number] Patient Account Number: [id number] Date of Birth/Sex: 1940-10-30 (75 y.o. Male) Treating RN: Baruch Gouty, RN, BSN, [place] Primary Care Physician:  [facility], Dossie Der Other Clinician: Referring Physician: [facility], [facility] Treating Physician/Extender: Frann Rider in Treatment: 24 Vital Signs Height(in): 74 Pulse(bpm): 64 Weight(lbs): 265 Blood Pressure 117/70 (mmHg): Body Mass Index(BMI): 34 Temperature(F): 97.5 Respiratory Rate 18 (breaths/min): Photos: [1:No Photos] [N/A:N/A] Wound Location: [1:Right Lower Leg - Lateral] [N/A:N/A] Wounding Event: [1:Trauma] [N/A:N/A] Primary Etiology: [1:Diabetic Wound/Ulcer of the Lower Extremity] [N/A:N/A] Comorbid History: [1:Glaucoma, Sleep Apnea, Coronary Artery Disease, Hypertension, Type  II Diabetes, Osteoarthritis] [N/A:N/A] Date Acquired: [1:08/07/2015] [N/A:N/A] Weeks of Treatment: [1:24] [N/A:N/A] Wound Status: [1:Open] [N/A:N/A] Measurements L x W x D 0.5x0.2x0.1 [N/A:N/A] (cm) Area (cm) : [1:0.079] [N/A:N/A] Volume (cm) : [1:0.008] [N/A:N/A] % Reduction in Area: [1:98.00%] [N/A:N/A] % Reduction in Volume: 99.70% [N/A:N/A] Classification: [1:Grade 1] [N/A:N/A] Exudate Amount: [1:Medium] [N/A:N/A] Exudate Type: [1:Serosanguineous] [N/A:N/A] Exudate Color: [1:red, brown] [N/A:N/A] Wound Margin: [1:Thickened] [N/A:N/A] Granulation Amount: [1:Large (67-100%)] [N/A:N/A] Granulation Quality: [1:Red] [N/A:N/A] Necrotic Amount: [1:Small (1-33%)] [N/A:N/A] Exposed Structures: [1:Fascia: No Fat: No Tendon: No Muscle: No] [N/A:N/A] Joint: No Bone: No Limited to Skin Breakdown Epithelialization: Medium (34-66%) N/A N/A Periwound Skin Texture: Induration: Yes N/A N/A Edema: No Excoriation: No Callus: No Crepitus: No Fluctuance: No Friable: No Rash: No Scarring: No Periwound Skin Maceration: No N/A N/A Moisture: Moist: No Dry/Scaly: No Periwound Skin Color: Erythema: Yes N/A N/A Atrophie Blanche: No Cyanosis: No Ecchymosis: No Hemosiderin Staining: No Mottled: No Pallor: No Rubor: No Temperature: No Abnormality N/A N/A Tenderness on Yes N/A N/A Palpation: Wound  Preparation: Ulcer Cleansing: N/A N/A Rinsed/Irrigated with Saline Topical Anesthetic Applied: Other: lidocaine 4% Treatment Notes Electronic Signature(s) Signed: 03/02/2016 5:10:52 PM By: Regan Lemming BSN, RN Entered By: Regan Lemming on 03/02/2016 14:34:09 Seth Mcintosh ([id number]) -------------------------------------------------------------------------------- Multi-Disciplinary Care Plan Details Patient Name: Seth Mcintosh Date of Service: 03/02/2016 2:15 PM Medical Record Number: [id number] Patient Account Number: [id number] Date of Birth/Sex: 02/23/1941 (75 y.o. Male) Treating RN: Baruch Gouty, RN, BSN, Velva Harman Primary Care Physician: [facility], Dossie Der Other Clinician: Referring Physician: Keith Rake Treating Physician/Extender: Frann Rider in Treatment: 46 Active Inactive Abuse / Safety / Falls / Self Care Management Nursing Diagnoses: Potential for falls Goals: Patient will remain injury free Date Initiated: 09/16/2015 Goal Status: Active Interventions: Assess fall risk on admission and as needed Notes: Orientation to the Wound Care Program Nursing Diagnoses: Knowledge deficit related to the wound healing center program Goals: Patient/caregiver will verbalize understanding of the [place] Program Date Initiated: 09/16/2015 Goal Status: Active Interventions: Provide education on orientation to the wound center Notes: Pain, Acute or Chronic Nursing Diagnoses: Pain, acute or chronic: actual or potential Potential alteration in comfort, pain Goals: Seth Mcintosh, Seth Mcintosh ([id number]) Patient will verbalize adequate pain control and receive pain control interventions during procedures as needed Date Initiated: 09/16/2015 Goal Status: Active Interventions: Assess comfort goal upon admission Complete pain assessment as per visit requirements Notes: Soft Tissue Infection Nursing Diagnoses: Impaired tissue integrity Goals: Patient will remain free of wound  infection Date Initiated: 09/16/2015 Goal Status: Active Interventions: Assess signs and symptoms of infection every visit Notes: Wound/Skin Impairment Nursing Diagnoses: Impaired tissue integrity Goals: Ulcer/skin breakdown will have a volume reduction of 30% by week 4 Date Initiated: 09/16/2015 Goal Status: Active Ulcer/skin breakdown will have a volume reduction of 50% by week 8 Date Initiated: 09/16/2015 Goal Status: Active Ulcer/skin breakdown will have a volume reduction of 80% by week 12 Date Initiated: 09/16/2015 Goal Status: Active Interventions: Assess patient/caregiver ability to obtain necessary supplies Assess ulceration(s) every visit Notes: Seth Mcintosh, Seth Mcintosh ([id number]) Electronic Signature(s) Signed: 03/02/2016 5:10:52 PM By: Regan Lemming BSN, RN Entered By: Regan Lemming on 03/02/2016 14:34:00 Seth Mcintosh ([id number]) -------------------------------------------------------------------------------- Pain Assessment Details Patient Name: Seth Mcintosh Date of Service: 03/02/2016 2:15 PM Medical Record Number: [id number] Patient Account Number: [id number] Date of Birth/Sex: Nov 17, 1940 (75 y.o. Male) Treating RN: Baruch Gouty, RN, BSN, [place] Primary Care Physician: [facility], Dossie Der Other Clinician: Referring Physician: [facility], Dossie Der Treating Physician/Extender: Frann Rider in Treatment: 24  Active Problems Location of Pain Severity and Description of Pain Patient Has Paino No Site Locations With Dressing Change: No Pain Management and Medication Current Pain Management: Electronic Signature(s) Signed: 03/02/2016 2:25:29 PM By: Regan Lemming BSN, RN Entered By: Regan Lemming on 03/02/2016 14:25:29 Seth Mcintosh ([id number]) -------------------------------------------------------------------------------- Patient/Caregiver Education Details Patient Name: Seth Mcintosh Date of Service: 03/02/2016 2:15 PM Medical Record Number: [id number] Patient Account Number:  [id number] Date of Birth/Gender: 1940-05-13 (75 y.o. Male) Treating RN: Baruch Gouty, RN, BSN, [place] Primary Care Physician: [facility], Dossie Der Other Clinician: Referring Physician: Keith Rake Treating Physician/Extender: Frann Rider in Treatment: 8 Education Assessment Education Provided To: Patient Education Topics Provided Welcome To [facility] [place]: Methods: Explain/Verbal Responses: State content correctly Electronic Signature(s) Signed: 03/02/2016 5:10:52 PM By: Regan Lemming BSN, RN Entered By: Regan Lemming on 03/02/2016 14:49:46 Seth Mcintosh ([id number]) -------------------------------------------------------------------------------- Wound Assessment Details Patient Name: Seth Mcintosh Date of Service: 03/02/2016 2:15 PM Medical Record Number: [id number] Patient Account Number: [id number] Date of Birth/Sex: 01/02/1941 (75 y.o. Male) Treating RN: Baruch Gouty, RN, BSN, [place] Primary Care Physician: [facility], Dossie Der Other Clinician: Referring Physician: [facility], Dossie Der Treating Physician/Extender: Frann Rider in Treatment: 24 Wound Status Wound Number: 1 Primary Diabetic Wound/Ulcer of the Lower Etiology: Extremity Wound Location: Right, Lateral Lower Leg Wound Open Wounding Event: Trauma Status: Date Acquired: 08/07/2015 Comorbid Glaucoma, Sleep Apnea, Coronary Weeks Of Treatment: 24 History: Artery Disease, Hypertension, Type II Clustered Wound: No Diabetes, Osteoarthritis Photos Photo Uploaded By: Regan Lemming on 03/02/2016 17:09:17 Wound Measurements Length: (cm) 0.5 Width: (cm) 0.2 Depth: (cm) 0.5 Area: (cm) 0.079 Volume: (cm) 0.039 % Reduction in Area: 98% % Reduction in Volume: 98.6% Epithelialization: Medium (34-66%) Tunneling: No Undermining: No Wound Description Classification: Grade 1 Foul Odor Aft Wound Margin: Thickened Exudate Amount: Medium Exudate Type: Serosanguineous Exudate Color: red, brown er Cleansing: No Wound Bed Granulation Amount:  Large (67-100%) Exposed Structure Granulation Quality: Red Fascia Exposed: No Necrotic Amount: Small (1-33%) Fat Layer Exposed: No Necrotic Quality: Adherent Slough Tendon Exposed: No Seth Mcintosh, Seth Mcintosh ([id number]) Muscle Exposed: No Joint Exposed: No Bone Exposed: No Limited to Skin Breakdown Periwound Skin Texture Texture Color No Abnormalities Noted: No No Abnormalities Noted: No Callus: No Atrophie Blanche: No Crepitus: No Cyanosis: No Excoriation: No Ecchymosis: No Fluctuance: No Erythema: Yes Friable: No Hemosiderin Staining: No Induration: Yes Mottled: No Localized Edema: No Pallor: No Rash: No Rubor: No Scarring: No Temperature / Pain Moisture Temperature: No Abnormality No Abnormalities Noted: No Tenderness on Palpation: Yes Dry / Scaly: No Maceration: No Moist: No Wound Preparation Ulcer Cleansing: Rinsed/Irrigated with Saline Topical Anesthetic Applied: Other: lidocaine 4%, Treatment Notes Wound #1 (Right, Lateral Lower Leg) 1. Cleansed with: Clean wound with Normal Saline 4. Dressing Applied: Prisma Ag 5. Secondary Dressing Applied Non-Adherent pad 7. Secured with Patient to wear own compression stockings Notes Coverlette Bandaid Electronic Signature(s) Signed: 03/02/2016 5:10:52 PM By: Regan Lemming BSN, RN Entered By: Regan Lemming on 03/02/2016 14:34:39 Seth Mcintosh ([id number]) -------------------------------------------------------------------------------- Vitals Details Patient Name: Seth Mcintosh Date of Service: 03/02/2016 2:15 PM Medical Record Number: [id number] Patient Account Number: [id number] Date of Birth/Sex: September 28, 1940 (75 y.o. Male) Treating RN: Afful, RN, BSN, [place] Primary Care Physician: [facility], Dossie Der Other Clinician: Referring Physician: [facility], Dossie Der Treating Physician/Extender: Frann Rider in Treatment: 24 Vital Signs Time Taken: 14:27 Temperature (F): 97.5 Height (in): 74 Pulse (bpm): 64 Weight (lbs):  265 Respiratory Rate (breaths/min): 18 Body Mass Index (BMI): 34 Blood Pressure (mmHg): 117/70 Reference Range: 80 -  120 mg / dl Electronic Signature(s) Signed: 03/02/2016 5:10:52 PM By: Regan Lemming BSN, RN Entered By: Regan Lemming on 03/02/2016 14:28:11

## 2016-03-03 NOTE — Progress Notes (Signed)
SHERVIN, LAMERS ([id number]) Visit Report for 03/02/2016 Chief Complaint Document Details Patient Name: Seth Mcintosh, Seth Mcintosh Date of Service: 03/02/2016 2:15 PM Medical Record Number: [id number] Patient Account Number: [id number] Date of Birth/Sex: 1941/02/28 (75 y.o. Male) Treating RN: Baruch Gouty, RN, BSN, Velva Harman Primary Care Physician: [facility], [place] Other Clinician: Referring Physician: Keith Rake Treating Physician/Extender: Frann Rider in Treatment: 24 Information Obtained from: Patient Chief Complaint Patient seen for complaints of Non-Healing Wound which she's had on the right lower extremity for about 5 weeks Electronic Signature(s) Signed: 03/02/2016 2:39:52 PM By: Christin Fudge MD, FACS Entered By: Christin Fudge on 03/02/2016 14:39:51 Seth Mcintosh ([id number]) -------------------------------------------------------------------------------- HPI Details Patient Name: Seth Mcintosh Date of Service: 03/02/2016 2:15 PM Medical Record Number: [id number] Patient Account Number: [id number] Date of Birth/Sex: 04/17/1941 (76 y.o. Male) Treating RN: Baruch Gouty, RN, BSN, [facility] Primary Care Physician: [facility], [place] Der Other Clinician: Referring Physician: Keith Rake Treating Physician/Extender: Frann Rider in Treatment: 24 History of Present Illness Location: open wound right lower extremity lateral leg Quality: Patient reports experiencing a dull pain to affected area(s). Severity: Patient states wound are getting worse. Duration: Patient has had the wound for > 6 weeks prior to seeking treatment at the wound center Timing: Pain in wound is Intermittent (comes and goes Context: The wound occurred when the patient had a injury with a blunt object around 08/07/2015 Modifying Factors: a large swelling with drainage Associated Signs and Symptoms: Patient reports having increase swelling. HPI Description: 75 year old gentleman with known history of diabetes mellitus without complication had  a lacerated wound to his right lower extremity which was repaired about 4 weeks ago. Was seen by his PCP Dr. Donneta Romberg with a open wound on the right lateral leg with some discharge and erythema and he was placed on a blocks and local dressing was applied and referred to [place] for further care. Past medical history significant for diabetes mellitus type 2, coronary artery disease, hypertension, arthritis, depression, foot surgery o2, back surgery, CABG, TURP, right total knee replacement. He is a former smoker and quit in 1994. On questioning the patient he gives a history that he had a blunt injury to his right leg and this happened on April 15 of this year and about 2 weeks later he had blood draining from here and had to go to the ER and have sutures placed. However on looking at his medical records he was in the ER on 05/17/2015 with a blunt injury on his right lower extremity and at that stage x-ray showed no fracture and the right lower leg ultrasound showed no evidence of DVT. Diagnosis of a hematoma was made and he was given symptomatic treatment for this. The next time he was seen in the emergency room was on 08/11/2015 when the patient had a 10 cm laceration to the right lateral leg and this was sutured by the ER physician Dr. Dahlia Client. His blood alcohol level was 162 mg percent that day and the sutures were to be removed in 10 days' time. 09/23/2015 -- had his ultrasound of the left lower extremity for possible hematoma and the impression was there was no discrete drainable hematoma in the area of clinical concern on the left lower extremity. On reviewing his history he clearly states that he never had a large lacerated wound due to the injury or fall on April 19. He says he did have a few drinks but has not drunk and the hematoma which he had for a couple of months just  burst open and caused the big bleed and extrusion of clots. 01/24/16: pt returns today for ongoing evaluation and  management of a chronic right lower leg wound. Not seen here since 01/03/16. "Vacation in [place] with my daughter and granddaughter." he denies systemic s/s of infection. no new wounds or skin breakdown. wound is improving. Electronic Signature(s) Signed: 03/02/2016 2:40:06 PM By: Christin Fudge MD, FACS Seth Mcintosh, Seth Mcintosh ([id number]) Entered By: Christin Fudge on 03/02/2016 14:40:05 Seth Mcintosh ([id number]) -------------------------------------------------------------------------------- Physical Exam Details Patient Name: Seth Mcintosh Date of Service: 03/02/2016 2:15 PM Medical Record Number: [id number] Patient Account Number: [id number] Date of Birth/Sex: 17-Aug-1940 (75 y.o. Male) Treating RN: Baruch Gouty, RN, BSN, Velva Harman Primary Care Physician: [facility], [place] Other Clinician: Referring Physician: [facility], [place] Der Treating Physician/Extender: Frann Rider in Treatment: 24 Constitutional . Pulse regular. Respirations normal and unlabored. Afebrile. . Eyes Nonicteric. Reactive to light. Ears, Nose, Mouth, and Throat Lips, teeth, and gums WNL.Marland Kitchen Moist mucosa without lesions. Neck supple and nontender. No palpable supraclavicular or cervical adenopathy. Normal sized without goiter. Respiratory WNL. No retractions.. Cardiovascular Pedal Pulses WNL. No clubbing, cyanosis or edema. Lymphatic No adneopathy. No adenopathy. No adenopathy. Musculoskeletal Adexa without tenderness or enlargement.. Digits and nails w/o clubbing, cyanosis, infection, petechiae, ischemia, or inflammatory conditions.. Integumentary (Hair, Skin) No suspicious lesions. No crepitus or fluctuance. No peri-wound warmth or erythema. No masses.Marland Kitchen Psychiatric Judgement and insight Intact.. No evidence of depression, anxiety, or agitation.. Notes the wound is looking excellent and there is no depth to it. A shallow ulcer is going to be dressed with Prisma AG and a bordered foam. Electronic Signature(s) Signed: 03/02/2016  2:40:52 PM By: Christin Fudge MD, FACS Entered By: Christin Fudge on 03/02/2016 14:40:51 Seth Mcintosh ([id number]) -------------------------------------------------------------------------------- Physician Orders Details Patient Name: Seth Mcintosh Date of Service: 03/02/2016 2:15 PM Medical Record Number: [id number] Patient Account Number: [id number] Date of Birth/Sex: [birth year] (75 y.o. Male) Treating RN: Baruch Gouty, RN, BSN, Velva Harman Primary Care Physician: [facility], [place] Other Clinician: Referring Physician: Keith Rake Treating Physician/Extender: Frann Rider in Treatment: 34 Verbal / Phone Orders: Yes Clinician: Afful, RN, BSN, Rita Read Back and Verified: Yes Diagnosis Coding Wound Cleansing Wound #1 Right,Lateral Lower Leg o Cleanse wound with mild soap and water o May Shower, gently pat wound dry prior to applying new dressing. Anesthetic Wound #1 Right,Lateral Lower Leg o Topical Lidocaine 4% cream applied to wound bed prior to debridement Skin Barriers/Peri-Wound Care Wound #1 Right,Lateral Lower Leg o Skin Prep Primary Wound Dressing Wound #1 Right,Lateral Lower Leg o Prisma Ag - moisten with saline Secondary Dressing Wound #1 Right,Lateral Lower Leg o Non-adherent pad - Coverlette bandaid Dressing Change Frequency Wound #1 Right,Lateral Lower Leg o Change dressing every other day. Follow-up Appointments Wound #1 Right,Lateral Lower Leg o Return Appointment in 2 weeks. Edema Control Wound #1 Right,Lateral Lower Leg o Elevate legs to the level of the heart and pump ankles as often as possible o Support Garment 20-30 mm/Hg pressure to: - wear compression stockings Seth Mcintosh, HARCUM. ([id number]) Additional Orders / Instructions Wound #1 Right,Lateral Lower Leg o Increase protein intake. Medications-please add to medication list. Wound #1 Right,Lateral Lower Leg o Other: - Vitamin C, Vitamin A, Zinc, MVI Electronic Signature(s) Signed:  03/02/2016 5:08:19 PM By: Christin Fudge MD, FACS Signed: 03/02/2016 5:10:52 PM By: Regan Lemming BSN, RN Entered By: Regan Lemming on 03/02/2016 14:38:36 Seth Mcintosh ([id number]) -------------------------------------------------------------------------------- Problem List Details Patient Name: Seth Mcintosh Date of Service: 03/02/2016 2:15 PM Medical Record  Number: [id number] Patient Account Number: [id number] Date of Birth/Sex: 07/30/1940 (75 y.o. Male) Treating RN: Baruch Gouty, RN, BSN, Velva Harman Primary Care Physician: 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 Male) Treating RN: Baruch Gouty, RN, BSN, [employer] Primary Care Physician: [facility], [place] Der Other Clinician: Referring Physician: Keith Rake Treating Physician/Extender: Frann Rider in Treatment: 24 Subjective Chief Complaint Information obtained from Patient Patient seen for complaints of Non-Healing Wound which she's had on the right lower extremity for about 5 weeks History of Present Illness (HPI) The following HPI elements were documented for the  patient's wound: Location: open wound right lower extremity lateral leg Quality: Patient reports experiencing a dull pain to affected area(s). Severity: Patient states wound are getting worse. Duration: Patient has had the wound for > 6 weeks prior to seeking treatment at the wound center Timing: Pain in wound is Intermittent (comes and goes Context: The wound occurred when the patient had a injury with a blunt object around 08/07/2015 Modifying Factors: a large swelling with drainage Associated Signs and Symptoms: Patient reports having increase swelling. 75 year old gentleman with known history of diabetes mellitus without complication had a lacerated wound to his right lower extremity which was repaired about 4 weeks ago. Was seen by his PCP Dr. Donneta Romberg with a open wound on the right lateral leg with some discharge and erythema and he was placed on a blocks and local dressing was applied and referred to [place] for further care. Past medical history significant for diabetes mellitus type 2, coronary artery disease, hypertension, arthritis, depression, foot surgery o2, back surgery, CABG, TURP, right total knee replacement. He is a former smoker and quit in 1994. On questioning the patient he gives a history that he had a blunt injury to his right leg and this happened on April 15 of this year and about 2 weeks later he had blood draining from here and had to go to the ER and have sutures placed. However on looking at his medical records he was in the ER on 05/17/2015 with a blunt injury on his right lower extremity and at that stage x-ray showed no fracture and the right lower leg ultrasound showed no evidence of DVT. Diagnosis of a hematoma was made and he was given symptomatic treatment for this. The next time he was seen in the emergency room was on 08/11/2015 when the patient had a 10 cm laceration to the right lateral leg and this was sutured by the ER physician Dr. Dahlia Client. His blood  alcohol level was 162 mg percent that day and the sutures were to be removed in 10 days' time. 09/23/2015 -- had his ultrasound of the left lower extremity for possible hematoma and the impression was there was no discrete drainable hematoma in the area of clinical concern on the left lower extremity. On reviewing his history he clearly states that  he never had a large lacerated wound due to the injury or fall on April 19. He says he did have a few drinks but has not drunk and the hematoma which he had for a Seth Mcintosh, PFALZGRAF. ([id number]) couple of months just burst open and caused the big bleed and extrusion of clots. 01/24/16: pt returns today for ongoing evaluation and management of a chronic right lower leg wound. Not seen here since 01/03/16. "Vacation in [place] with my daughter and granddaughter." he denies systemic s/s of infection. no new wounds or skin breakdown. wound is improving. Objective Constitutional Pulse regular. Respirations normal and unlabored. Afebrile. Vitals Time Taken: 2:27 PM, Height: 74 in, Weight: 265 lbs, BMI: 34, Temperature: 97.5 F, Pulse: 64 bpm, Respiratory Rate: 18 breaths/min, Blood Pressure: 117/70 mmHg. Eyes Nonicteric. Reactive to light. Ears, Nose, Mouth, and Throat Lips, teeth, and gums WNL.Marland Kitchen Moist mucosa without lesions. Neck supple and nontender. No palpable supraclavicular or cervical adenopathy. Normal sized without goiter. Respiratory WNL. No retractions.. Cardiovascular Pedal Pulses WNL. No clubbing, cyanosis or edema. Lymphatic No adneopathy. No adenopathy. No adenopathy. Musculoskeletal Adexa without tenderness or enlargement.. Digits and nails w/o clubbing, cyanosis, infection, petechiae, ischemia, or inflammatory conditions.Marland Kitchen Psychiatric Judgement and insight Intact.. No evidence of depression, anxiety, or agitation.. General Notes: the wound is looking excellent and there is no depth to it. A shallow ulcer is going to be dressed  with Prisma AG and a bordered foam. Integumentary (Hair, Skin) Seth Mcintosh, Seth E. ([id number]) No suspicious lesions. No crepitus or fluctuance. No peri-wound warmth or erythema. No masses.. Wound #1 status is Open. Original cause of wound was Trauma. The wound is located on the Right,Lateral Lower Leg. The wound measures 0.5cm length x 0.2cm width x 0.5cm depth; 0.079cm^2 area and 0.039cm^3 volume. The wound is limited to skin breakdown. There is no tunneling or undermining noted. There is a medium amount of serosanguineous drainage noted. The wound margin is thickened. There is large (67-100%) red granulation within the wound bed. There is a small (1-33%) amount of necrotic tissue within the wound bed including Adherent Slough. The periwound skin appearance exhibited: Induration, Erythema. The periwound skin appearance did not exhibit: Callus, Crepitus, Excoriation, Fluctuance, Friable, Localized Edema, Rash, Scarring, Dry/Scaly, Maceration, Moist, Atrophie Blanche, Cyanosis, Ecchymosis, Hemosiderin Staining, Mottled, Pallor, Rubor. The surrounding wound skin color is noted with erythema. Periwound temperature was noted as No Abnormality. The periwound has tenderness on palpation. 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 Anesthetic: Wound #1 Right,Lateral Lower Leg: Topical Lidocaine 4% cream applied to wound bed prior to debridement Skin Barriers/Peri-Wound Care: Wound #1 Right,Lateral Lower Leg: Skin Prep Primary Wound Dressing: Wound #1 Right,Lateral Lower Leg: Prisma Ag - moisten with saline Secondary  Dressing: Wound #1 Right,Lateral Lower Leg: Non-adherent pad - Coverlette bandaid Dressing Change FrequencyKORRY, Seth Mcintosh ([id number]) Wound #1 Right,Lateral Lower Leg: Change dressing every other day. Follow-up Appointments: Wound #1 Right,Lateral Lower Leg: Return Appointment in 2 weeks. Edema Control: Wound #1 Right,Lateral Lower Leg: Elevate legs to the level of the heart and pump ankles as often as possible Support Garment 20-30 mm/Hg pressure to: - wear compression stockings Additional Orders /  Instructions: Wound #1 Right,Lateral Lower Leg: Increase protein intake. Medications-please add to medication list.: Wound #1 Right,Lateral Lower Leg: Other: - Vitamin C, Vitamin A, Zinc, MVI He has done very well and anticipate discharge from the wound center soon. Until then we will continue with Prisma AG and a bordered foam. Electronic Signature(s) Signed: 03/02/2016 2:41:28 PM By: Christin Fudge MD, FACS Entered By: Christin Fudge on 03/02/2016 14:41:27 Seth Mcintosh ([id number]) -------------------------------------------------------------------------------- [place] Details Patient Name: Seth Mcintosh Date of Service: 03/02/2016 Medical Record Number: [id number] Patient Account Number: [id number] Date of Birth/Sex: January 05, 1941 (75 y.o. Male) Treating RN: Baruch Gouty, RN, BSN, [employer] Primary Care Physician: [facility], [place] Other Clinician: Referring Physician: Keith Rake Treating Physician/Extender: Frann Rider in Treatment: 24 Diagnosis Coding ICD-10 Codes Code Description E11.622 Type 2 diabetes mellitus with other skin ulcer L97.213 Non-pressure chronic ulcer of right calf with necrosis of muscle M70.861 Other soft tissue disorders related to use, overuse and pressure, right lower leg Facility Procedures CPT4 Code: [id number] Description: [id number] - WOUND CARE VISIT-LEV 2 EST PT Modifier: Quantity: 1 Physician Procedures CPT4: Description Modifier Quantity Code  [id number] - WC PHYS LEVEL 3 - EST PT 1 ICD-10 Description Diagnosis L97.213 Non-pressure chronic ulcer of right calf with necrosis of muscle E11.622 Type 2 diabetes mellitus with other skin ulcer M70.861 Other  soft tissue disorders related to use, overuse and pressure, right lower leg Electronic Signature(s) Signed: 03/02/2016 2:41:47 PM By: Christin Fudge MD, FACS Entered By: Christin Fudge on 03/02/2016 14:41:46

## 2016-03-13 ENCOUNTER — Other Ambulatory Visit: Payer: Self-pay | Admitting: Family Medicine

## 2016-03-13 ENCOUNTER — Encounter: Payer: Medicare Other | Admitting: Surgery

## 2016-03-13 DIAGNOSIS — I10 Essential (primary) hypertension: Secondary | ICD-10-CM | POA: Diagnosis not present

## 2016-03-13 DIAGNOSIS — E782 Mixed hyperlipidemia: Secondary | ICD-10-CM | POA: Diagnosis not present

## 2016-03-13 DIAGNOSIS — M70861 Other soft tissue disorders related to use, overuse and pressure, right lower leg: Secondary | ICD-10-CM | POA: Diagnosis not present

## 2016-03-13 DIAGNOSIS — Z87891 Personal history of nicotine dependence: Secondary | ICD-10-CM | POA: Diagnosis not present

## 2016-03-13 DIAGNOSIS — E119 Type 2 diabetes mellitus without complications: Secondary | ICD-10-CM | POA: Diagnosis not present

## 2016-03-13 DIAGNOSIS — R002 Palpitations: Secondary | ICD-10-CM | POA: Diagnosis not present

## 2016-03-13 DIAGNOSIS — I2510 Atherosclerotic heart disease of native coronary artery without angina pectoris: Secondary | ICD-10-CM | POA: Diagnosis not present

## 2016-03-13 DIAGNOSIS — E11622 Type 2 diabetes mellitus with other skin ulcer: Secondary | ICD-10-CM | POA: Diagnosis not present

## 2016-03-13 DIAGNOSIS — I25810 Atherosclerosis of coronary artery bypass graft(s) without angina pectoris: Secondary | ICD-10-CM | POA: Diagnosis not present

## 2016-03-13 DIAGNOSIS — L97213 Non-pressure chronic ulcer of right calf with necrosis of muscle: Secondary | ICD-10-CM | POA: Diagnosis not present

## 2016-03-13 NOTE — Progress Notes (Addendum)
Seth Mcintosh ([id number]) Visit Report for 03/13/2016 Chief Complaint Document Details Patient Name: Seth Mcintosh, Seth Mcintosh Date of Service: 03/13/2016 10:45 AM Medical Record Number: [id number] Patient Account Number: [id number] Date of Birth/Sex: 1940/06/02 (75 y.o. Male) Treating RN: Ahmed Prima Primary Care Physician: [facility], Dossie Der Other Clinician: Referring Physician: Keith Rake Treating Physician/Extender: Frann Rider in Treatment: 25 Information Obtained from: Patient Chief Complaint Patient seen for complaints of Non-Healing Wound which she's had on the right lower extremity for about 5 weeks Electronic Signature(s) Signed: 03/13/2016 11:28:13 AM By: Christin Fudge MD, FACS Entered By: Christin Fudge on 03/13/2016 11:28:11 Seth Mcintosh ([id number]) -------------------------------------------------------------------------------- HPI Details Patient Name: Seth Mcintosh Date of Service: 03/13/2016 10:45 AM Medical Record Number: [id number] Patient Account Number: [id number] Date of Birth/Sex: 11/29/1940 (75 y.o. Male) Treating RN: Ahmed Prima Primary Care Physician: [facility], Dossie Der Other Clinician: Referring Physician: [facility], Dossie Der Treating Physician/Extender: Frann Rider in Treatment: 25 History of Present Illness Location: open wound right lower extremity lateral leg Quality: Patient reports experiencing a dull pain to affected area(s). Severity: Patient states wound are getting worse. Duration: Patient has had the wound for > 6 weeks prior to seeking treatment at the wound center Timing: Pain in wound is Intermittent (comes and goes Context: The wound occurred when the patient had a injury with a blunt object around 08/07/2015 Modifying Factors: a large swelling with drainage Associated Signs and Symptoms: Patient reports having increase swelling. HPI Description: 75 year old gentleman with known history of diabetes mellitus without complication had  a lacerated wound to his right lower extremity which was repaired about 4 weeks ago. Was seen by his PCP Dr. Donneta Romberg with a open wound on the right lateral leg with some discharge and erythema and he was placed on a blocks and local dressing was applied and referred to [place] for further care. Past medical history significant for diabetes mellitus type 2, coronary artery disease, hypertension, arthritis, depression, foot surgery o2, back surgery, CABG, TURP, right total knee replacement. He is a former smoker and quit in 1994. On questioning the patient he gives a history that he had a blunt injury to his right leg and this happened on April 15 of this year and about 2 weeks later he had blood draining from here and had to go to the ER and have sutures placed. However on looking at his medical records he was in the ER on 05/17/2015 with a blunt injury on his right lower extremity and at that stage x-ray showed no fracture and the right lower leg ultrasound showed no evidence of DVT. Diagnosis of a hematoma was made and he was given symptomatic treatment for this. The next time he was seen in the emergency room was on 08/11/2015 when the patient had a 10 cm laceration to the right lateral leg and this was sutured by the ER physician Dr. Dahlia Client. His blood alcohol level was 162 mg percent that day and the sutures were to be removed in 10 days' time. 09/23/2015 -- had his ultrasound of the left lower extremity for possible hematoma and the impression was there was no discrete drainable hematoma in the area of clinical concern on the left lower extremity. On reviewing his history he clearly states that he never had a large lacerated wound due to the injury or fall on April 19. He says he did have a few drinks but has not drunk and the hematoma which he had for a couple of months just burst open and caused  the big bleed and extrusion of clots. 01/24/16: pt returns today for ongoing evaluation and  management of a chronic right lower leg wound. Not seen here since 01/03/16. "Vacation in [place] with my daughter and granddaughter." he denies systemic s/s of infection. no new wounds or skin breakdown. wound is improving. Electronic Signature(s) Signed: 03/13/2016 11:28:41 AM By: Christin Fudge MD, FACS Seth Mcintosh, Seth Mcintosh ([id number]) Entered By: Christin Fudge on 03/13/2016 11:28:38 Seth Mcintosh ([id number]) -------------------------------------------------------------------------------- Physical Exam Details Patient Name: Seth Mcintosh Date of Service: 03/13/2016 10:45 AM Medical Record Number: [id number] Patient Account Number: [id number] Date of Birth/Sex: [birth year] (75 y.o. Male) Treating RN: Ahmed Prima Primary Care Physician: [facility], Dossie Der Other Clinician: Referring Physician: [facility], [facility] Treating Physician/Extender: Frann Rider in Treatment: 25 Constitutional . Pulse regular. Respirations normal and unlabored. Afebrile. . Eyes Nonicteric. Reactive to light. Ears, Nose, Mouth, and Throat Lips, teeth, and gums WNL.Marland Kitchen Moist mucosa without lesions. Neck supple and nontender. No palpable supraclavicular or cervical adenopathy. Normal sized without goiter. Respiratory WNL. No retractions.. Breath sounds WNL, No rubs, rales, rhonchi, or wheeze.. Cardiovascular Heart rhythm and rate regular, no murmur or gallop.. Pedal Pulses WNL. Chest Breast tissue WNL, no masses, lumps, or tenderness.. Lymphatic No adneopathy. No adenopathy. No adenopathy. Musculoskeletal Adexa without tenderness or enlargement.. Digits and nails w/o clubbing, cyanosis, infection, petechiae, ischemia, or inflammatory conditions.. Integumentary (Hair, Skin) No suspicious lesions. No crepitus or fluctuance. No peri-wound warmth or erythema. No masses.Marland Kitchen Psychiatric Judgement and insight Intact.. No evidence of depression, anxiety, or agitation.. Notes the wound is completely healed. Electronic  Signature(s) Signed: 03/13/2016 11:29:38 AM By: Christin Fudge MD, FACS Entered By: Christin Fudge on 03/13/2016 11:29:34 Seth Mcintosh ([id number]) -------------------------------------------------------------------------------- Physician Orders Details Patient Name: Seth Mcintosh Date of Service: 03/13/2016 10:45 AM Medical Record Number: [id number] Patient Account Number: [id number] Date of Birth/Sex: Jul 20, 1940 (75 y.o. Male) Treating RN: Ahmed Prima Primary Care Physician: [facility], Dossie Der Other Clinician: Referring Physician: [facility], [facility] Treating Physician/Extender: Frann Rider in Treatment: 67 Verbal / Phone Orders: Yes ClinicianCarolyne Fiscal, Debi Read Back and Verified: Yes Diagnosis Coding ICD-10 Coding Code Description E11.622 Type 2 diabetes mellitus with other skin ulcer L97.213 Non-pressure chronic ulcer of right calf with necrosis of muscle M70.861 Other soft tissue disorders related to use, overuse and pressure, right lower leg Discharge From [place] o Discharge from [place] - Please contact our office if you have any questions or concerns. Electronic Signature(s) Signed: 03/13/2016 4:55:45 PM By: Christin Fudge MD, FACS Signed: 03/13/2016 5:09:25 PM By: Alric Quan Entered By: Alric Quan on 03/13/2016 11:31:42 Seth Mcintosh ([id number]) -------------------------------------------------------------------------------- Problem List Details Patient Name: Seth Mcintosh Date of Service: 03/13/2016 10:45 AM Medical Record Number: [id number] Patient Account Number: [id number] Date of Birth/Sex: [birth year] (75 y.o. Male) Treating RN: Ahmed Prima Primary Care Physician: [facility], Dossie Der Other Clinician: Referring Physician: [facility], [facility] Treating Physician/Extender: Frann Rider in Treatment: 25 Active Problems ICD-10 Encounter Code Description Active Date Diagnosis E11.622 Type 2 diabetes mellitus with other skin ulcer  09/16/2015 Yes L97.213 Non-pressure chronic ulcer of right calf with necrosis of 09/16/2015 Yes muscle M70.861 Other soft tissue disorders related to use, overuse and 09/16/2015 Yes pressure, right lower leg Inactive Problems Resolved Problems Electronic Signature(s) Signed: 03/13/2016 11:27:33 AM By: Christin Fudge MD, FACS Entered By: Christin Fudge on 03/13/2016 11:27:31 Seth Mcintosh ([id number]) -------------------------------------------------------------------------------- Progress Note Details Patient Name: Seth Mcintosh Date of Service: 03/13/2016 10:45 AM Medical Record Number: [id number] Patient Account Number:  [id number] Date of Birth/Sex: 05-22-1940 (75 y.o. Male) Treating RN: Carolyne Fiscal, Debi Primary Care Physician: [facility], [facility] Other Clinician: Referring Physician: [facility], [facility] Treating Physician/Extender: Frann Rider in Treatment: 25 Subjective Chief Complaint Information obtained from Patient Patient seen for complaints of Non-Healing Wound which she's had on the right lower extremity for about 5 weeks History of Present Illness (HPI) The following HPI elements were documented for the patient's wound: Location: open wound right lower extremity lateral leg Quality: Patient reports experiencing a dull pain to affected area(s). Severity: Patient states wound are getting worse. Duration: Patient has had the wound for > 6 weeks prior to seeking treatment at the wound center Timing: Pain in wound is Intermittent (comes and goes Context: The wound occurred when the patient had a injury with a blunt object around 08/07/2015 Modifying Factors: a large swelling with drainage Associated Signs and Symptoms: Patient reports having increase swelling. 75 year old gentleman with known history of diabetes mellitus without complication had a lacerated wound to his right lower extremity which was repaired about 4 weeks ago. Was seen by his PCP Dr. Donneta Romberg with a open wound on  the right lateral leg with some discharge and erythema and he was placed on a blocks and local dressing was applied and referred to [place] for further care. Past medical history significant for diabetes mellitus type 2, coronary artery disease, hypertension, arthritis, depression, foot surgery o2, back surgery, CABG, TURP, right total knee replacement. He is a former smoker and quit in 1994. On questioning the patient he gives a history that he had a blunt injury to his right leg and this happened on April 15 of this year and about 2 weeks later he had blood draining from here and had to go to the ER and have sutures placed. However on looking at his medical records he was in the ER on 05/17/2015 with a blunt injury on his right lower extremity and at that stage x-ray showed no fracture and the right lower leg ultrasound showed no evidence of DVT. Diagnosis of a hematoma was made and he was given symptomatic treatment for this. The next time he was seen in the emergency room was on 08/11/2015 when the patient had a 10 cm laceration to the right lateral leg and this was sutured by the ER physician Dr. Dahlia Client. His blood alcohol level was 162 mg percent that day and the sutures were to be removed in 10 days' time. 09/23/2015 -- had his ultrasound of the left lower extremity for possible hematoma and the impression was there was no discrete drainable hematoma in the area of clinical concern on the left lower extremity. On reviewing his history he clearly states that he never had a large lacerated wound due to the injury or fall on April 19. He says he did have a few drinks but has not drunk and the hematoma which he had for a Seth Mcintosh, Seth Mcintosh. ([id number]) couple of months just burst open and caused the big bleed and extrusion of clots. 01/24/16: pt returns today for ongoing evaluation and management of a chronic right lower leg wound. Not seen here since 01/03/16. "Vacation in [place] with my daughter  and granddaughter." he denies systemic s/s of infection. no new wounds or skin breakdown. wound is improving. Objective Constitutional Pulse regular. Respirations normal and unlabored. Afebrile. Vitals Time Taken: 11:10 AM, Height: 74 in, Weight: 265 lbs, BMI: 34, Temperature: 97.8 F, Pulse: 64 bpm, Respiratory Rate: 18 breaths/min, Blood Pressure: 130/92 mmHg. Eyes Nonicteric.  Reactive to light. Ears, Nose, Mouth, and Throat Lips, teeth, and gums WNL.Marland Kitchen Moist mucosa without lesions. Neck supple and nontender. No palpable supraclavicular or cervical adenopathy. Normal sized without goiter. Respiratory WNL. No retractions.. Breath sounds WNL, No rubs, rales, rhonchi, or wheeze.. Cardiovascular Heart rhythm and rate regular, no murmur or gallop.. Pedal Pulses WNL. Chest Breast tissue WNL, no masses, lumps, or tenderness.. Lymphatic No adneopathy. No adenopathy. No adenopathy. Musculoskeletal Adexa without tenderness or enlargement.. Digits and nails w/o clubbing, cyanosis, infection, petechiae, ischemia, or inflammatory conditions.Marland Kitchen Psychiatric Judgement and insight Intact.. No evidence of depression, anxiety, or agitation.. General Notes: the wound is completely healed. Seth Mcintosh, Seth Mcintosh ([id number]) Integumentary (Hair, Skin) No suspicious lesions. No crepitus or fluctuance. No peri-wound warmth or erythema. No masses.. Wound #1 status is Open. Original cause of wound was Trauma. The wound is located on the Right,Lateral Lower Leg. The wound measures 0cm length x 0cm width x 0cm depth; 0cm^2 area and 0cm^3 volume. The wound is limited to skin breakdown. There is no tunneling or undermining noted. There is a none present amount of drainage noted. The wound margin is thickened. There is no granulation within the wound bed. There is no necrotic tissue within the wound bed. The periwound skin appearance exhibited: Erythema. The periwound skin appearance did not exhibit: Callus,  Crepitus, Excoriation, Fluctuance, Friable, Induration, Localized Edema, Rash, Scarring, Dry/Scaly, Maceration, Moist, Atrophie Blanche, Cyanosis, Ecchymosis, Hemosiderin Staining, Mottled, Pallor, Rubor. The surrounding wound skin color is noted with erythema. Periwound temperature was noted as No Abnormality. The periwound has tenderness on palpation. 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 the wound is completely healed and I have asked him to watch for any discharge and continue to wear his compression stockings and I have discharged him from the wound care services. Electronic Signature(s) Signed: 03/20/2016 4:44:05 PM By: Christin Fudge MD, FACS Seth Mcintosh ([id number]) Previous Signature: 03/13/2016 11:30:32 AM Version By: Christin Fudge MD, FACS Entered By: Christin Fudge on 03/20/2016 16:44:05 Seth Mcintosh ([id number]) -------------------------------------------------------------------------------- [place] Details Patient Name: Seth Mcintosh Date of Service: 03/13/2016 Medical Record Number: [id number] Patient Account Number: [id number] Date of Birth/Sex: 1940/05/09 (75 y.o. Male) Treating RN: Ahmed Prima Primary Care Physician: [facility], Dossie Der Other Clinician: Referring Physician: [facility], [facility] Treating Physician/Extender: Frann Rider in Treatment: 25 Diagnosis Coding ICD-10 Codes Code Description E11.622 Type 2 diabetes mellitus with other skin ulcer L97.213 Non-pressure chronic ulcer of right calf with necrosis of muscle M70.861 Other soft tissue disorders related to use, overuse and pressure, right lower  leg Facility Procedures CPT4 Code: [id number] Description: [id number] - WOUND CARE VISIT-LEV 2 EST PT Modifier: Quantity: 1 Physician Procedures CPT4: Description Modifier Quantity Code [id number] - WC PHYS LEVEL 2 - EST PT 1 ICD-10 Description Diagnosis E11.622 Type 2 diabetes mellitus with other skin ulcer L97.213 Non-pressure chronic ulcer of right calf with necrosis of muscle M70.861 Other  soft tissue disorders related to use, overuse and pressure, right lower leg Electronic Signature(s) Signed: 03/13/2016 4:55:45 PM By: Christin Fudge MD, FACS Signed: 03/13/2016 5:09:25 PM By: Alric Quan Previous Signature: 03/13/2016 11:31:01 AM Version By: Christin Fudge MD, FACS Entered By: Alric Quan on  03/13/2016 11:40:09 °

## 2016-03-14 NOTE — Progress Notes (Addendum)
Seth Mcintosh ([id number]) Visit Report for 03/13/2016 Arrival Information Details Patient Name: Seth Mcintosh, Seth Mcintosh Date of Service: 03/13/2016 10:45 AM Medical Record Number: [id number] Patient Account Number: [id number] Date of Birth/Sex: 1940/10/04 (75 y.o. Male) Treating RN: Ahmed Prima Primary Care Physician: [facility], Dossie Der Other Clinician: Referring Physician: [facility], Dossie Der Treating Physician/Extender: Frann Rider in Treatment: 25 Visit Information History Since Last Visit All ordered tests and consults were completed: No Patient Arrived: Ambulatory Added or deleted any medications: No Arrival Time: 11:08 Any new allergies or adverse reactions: No Accompanied By: self Had a fall or experienced change in No Transfer Assistance: None activities of daily living that may affect Patient Identification Verified: Yes risk of falls: Secondary Verification Process Yes Signs or symptoms of abuse/neglect since last No Completed: visito Patient Requires Transmission-Based No Hospitalized since last visit: No Precautions: Pain Present Now: No Patient Has Alerts: Yes Patient Alerts: DM II Electronic Signature(s) Signed: 03/13/2016 5:09:25 PM By: Alric Quan Entered By: Alric Quan on 03/13/2016 11:09:48 Seth Mcintosh ([id number]) -------------------------------------------------------------------------------- Clinic Level of Care Assessment Details Patient Name: Seth Mcintosh Date of Service: 03/13/2016 10:45 AM Medical Record Number: [id number] Patient Account Number: [id number] Date of Birth/Sex: [birth year] (75 y.o. Male) Treating RN: Ahmed Prima Primary Care Physician: [facility], Dossie Der Other Clinician: Referring Physician: [facility], [facility] Treating Physician/Extender: Frann Rider in Treatment: 25 Clinic Level of Care Assessment Items TOOL 4 Quantity Score X - Use when only an EandM is performed on FOLLOW-UP visit 1 0 ASSESSMENTS - Nursing  Assessment / Reassessment X - Reassessment of Co-morbidities (includes updates in patient status) 1 10 X - Reassessment of Adherence to Treatment Plan 1 5 ASSESSMENTS - Wound and Skin Assessment / Reassessment X - Simple Wound Assessment / Reassessment - one wound 1 5 []  - Complex Wound Assessment / Reassessment - multiple wounds 0 []  - Dermatologic / Skin Assessment (not related to wound area) 0 ASSESSMENTS - Focused Assessment []  - Circumferential Edema Measurements - multi extremities 0 []  - Nutritional Assessment / Counseling / Intervention 0 []  - Lower Extremity Assessment (monofilament, tuning fork, pulses) 0 []  - Peripheral Arterial Disease Assessment (using hand held doppler) 0 ASSESSMENTS - Ostomy and/or Continence Assessment and Care []  - Incontinence Assessment and Management 0 []  - Ostomy Care Assessment and Management (repouching, etc.) 0 PROCESS - Coordination of Care X - Simple Patient / Family Education for ongoing care 1 15 []  - Complex (extensive) Patient / Family Education for ongoing care 0 X - Staff obtains Programmer, systems, Records, Test Results / Process Orders 1 10 []  - Staff telephones HHA, Nursing Homes / Clarify orders / etc 0 []  - Routine Transfer to another Facility (non-emergent condition) 0 HARSIMRAN, TLASECA ([id number]) []  - Routine Hospital Admission (non-emergent condition) 0 []  - New Admissions / Biomedical engineer / Ordering NPWT, Apligraf, etc. 0 []  - Emergency Hospital Admission (emergent condition) 0 X - Simple Discharge Coordination 1 10 []  - Complex (extensive) Discharge Coordination 0 PROCESS - Special Needs []  - Pediatric / Minor Patient Management 0 []  - Isolation Patient Management 0 []  - Hearing / Language / Visual special needs 0 []  - Assessment of Community assistance (transportation, D/C planning, etc.) 0 []  - Additional assistance / Altered mentation 0 []  - Support Surface(s) Assessment (bed, cushion, seat, etc.) 0 INTERVENTIONS - Wound  Cleansing / Measurement X - Simple Wound Cleansing - one wound 1 5 []  - Complex Wound Cleansing - multiple wounds 0 X - Wound Imaging (photographs - any number  of wounds) 1 5 []  - Wound Tracing (instead of photographs) 0 []  - Simple Wound Measurement - one wound 0 []  - Complex Wound Measurement - multiple wounds 0 INTERVENTIONS - Wound Dressings []  - Small Wound Dressing one or multiple wounds 0 []  - Medium Wound Dressing one or multiple wounds 0 []  - Large Wound Dressing one or multiple wounds 0 []  - Application of Medications - topical 0 []  - Application of Medications - injection 0 INTERVENTIONS - Miscellaneous []  - External ear exam 0 JERMARCUS, GRAUBERGER ([id number]) []  - Specimen Collection (cultures, biopsies, blood, body fluids, etc.) 0 []  - Specimen(s) / Culture(s) sent or taken to Lab for analysis 0 []  - Patient Transfer (multiple staff / Harrel Lemon Lift / Similar devices) 0 []  - Simple Staple / Suture removal (25 or less) 0 []  - Complex Staple / Suture removal (26 or more) 0 []  - Hypo / Hyperglycemic Management (close monitor of Blood Glucose) 0 []  - Ankle / Brachial Index (ABI) - do not check if billed separately 0 X - Vital Signs 1 5 Has the patient been seen at the hospital within the last three years: Yes Total Score: 70 Level Of Care: New/Established - Level 2 Electronic Signature(s) Signed: 03/13/2016 5:09:25 PM By: Alric Quan Entered By: Alric Quan on 03/13/2016 11:39:45 Seth Mcintosh ([id number]) -------------------------------------------------------------------------------- Encounter Discharge Information Details Patient Name: Seth Mcintosh Date of Service: 03/13/2016 10:45 AM Medical Record Number: [id number] Patient Account Number: [id number] Date of Birth/Sex: [birth year] (75 y.o. Male) Treating RN: Ahmed Prima Primary Care Physician: [facility], Dossie Der Other Clinician: Referring Physician: [facility], [facility] Treating Physician/Extender: Frann Rider in Treatment: 25 Encounter Discharge Information Items Discharge Pain Level: 0 Discharge Condition: Stable Ambulatory Status: Ambulatory Discharge Destination: Home Transportation: Private Auto Accompanied By: self Schedule Follow-up Appointment: No Medication Reconciliation completed and provided to Patient/Care Yes Reagen Haberman: Provided on Clinical Summary of Care: 03/13/2016 Form Type Recipient Paper Patient JK Electronic Signature(s) Signed: 03/13/2016 11:39:41 AM By: Ruthine Dose Entered By: Ruthine Dose on 03/13/2016 11:39:41 Seth Mcintosh ([id number]) -------------------------------------------------------------------------------- Lower Extremity Assessment Details Patient Name: Seth Mcintosh Date of Service: 03/13/2016 10:45 AM Medical Record Number: [id number] Patient Account Number: [id number] Date of Birth/Sex: Sep 04, 1940 (75 y.o. Male) Treating RN: Ahmed Prima Primary Care Physician: [facility], Dossie Der Other Clinician: Referring Physician: [facility], [facility] Treating Physician/Extender: Frann Rider in Treatment: 25 Vascular Assessment Pulses: Posterior Tibial Extremity colors, hair growth, and conditions: Extremity Color: [Right:Mottled] Temperature of Extremity: [Right:Warm] Capillary Refill: [Right:< 3 seconds] Electronic Signature(s) Signed: 03/13/2016 5:09:25 PM By: Alric Quan Entered By: Alric Quan on 03/13/2016 11:11:08 Seth Mcintosh ([id number]) -------------------------------------------------------------------------------- Multi Wound Chart Details Patient Name: Seth Mcintosh Date of Service: 03/13/2016 10:45 AM Medical Record Number: [id number] Patient Account Number: [id number] Date of Birth/Sex: 1940-05-08 (75 y.o. Male) Treating RN: Ahmed Prima Primary Care Physician: [facility], Dossie Der Other Clinician: Referring Physician: [facility], [facility] Treating Physician/Extender: Frann Rider in Treatment: 25 Vital  Signs Height(in): 74 Pulse(bpm): 64 Weight(lbs): 265 Blood Pressure 130/92 (mmHg): Body Mass Index(BMI): 34 Temperature(F): 97.8 Respiratory Rate 18 (breaths/min): Photos: [1:No Photos] [N/A:N/A] Wound Location: [1:Right Lower Leg - Lateral] [N/A:N/A] Wounding Event: [1:Trauma] [N/A:N/A] Primary Etiology: [1:Diabetic Wound/Ulcer of the Lower Extremity] [N/A:N/A] Comorbid History: [1:Glaucoma, Sleep Apnea, Coronary Artery Disease, Hypertension, Type II Diabetes, Osteoarthritis] [N/A:N/A] Date Acquired: [1:08/07/2015] [N/A:N/A] Weeks of Treatment: [1:25] [N/A:N/A] Wound Status: [1:Open] [N/A:N/A] Measurements L x W x D 0.5x0.2x0.1 [N/A:N/A] (cm) Area (cm) : [1:0.079] [N/A:N/A] Volume (cm) : [1:0.008] [N/A:N/A] % Reduction  in Area: [1:98.00%] [N/A:N/A] % Reduction in Volume: 99.70% [N/A:N/A] Classification: [1:Grade 1] [N/A:N/A] Exudate Amount: [1:None Present] [N/A:N/A] Wound Margin: [1:Thickened] [N/A:N/A] Granulation Amount: [1:Large (67-100%)] [N/A:N/A] Granulation Quality: [1:Red] [N/A:N/A] Necrotic Amount: [1:None Present (0%)] [N/A:N/A] Exposed Structures: [1:Fascia: No Fat: No Tendon: No Muscle: No Joint: No Bone: No] [N/A:N/A] Limited to Skin Breakdown Epithelialization: Medium (34-66%) N/A N/A Periwound Skin Texture: Induration: Yes N/A N/A Edema: No Excoriation: No Callus: No Crepitus: No Fluctuance: No Friable: No Rash: No Scarring: No Periwound Skin Maceration: No N/A N/A Moisture: Moist: No Dry/Scaly: No Periwound Skin Color: Erythema: Yes N/A N/A Atrophie Blanche: No Cyanosis: No Ecchymosis: No Hemosiderin Staining: No Mottled: No Pallor: No Rubor: No Temperature: No Abnormality N/A N/A Tenderness on Yes N/A N/A Palpation: Wound Preparation: Ulcer Cleansing: N/A N/A Rinsed/Irrigated with Saline Topical Anesthetic Applied: Other: lidocaine 4% Treatment Notes Electronic Signature(s) Signed: 03/13/2016 5:09:25 PM By: Alric Quan Entered By: Alric Quan on 03/13/2016 11:15:03 Seth Mcintosh ([id number]) -------------------------------------------------------------------------------- Multi-Disciplinary Care Plan Details Patient Name: Seth Mcintosh Date of Service: 03/13/2016 10:45 AM Medical Record Number: [id number] Patient Account Number: [id number] Date of Birth/Sex: 23-Mar-1941 (75 y.o. Male) Treating RN: Ahmed Prima Primary Care Physician: [facility], Dossie Der Other Clinician: Referring Physician: [facility], [facility] Treating Physician/Extender: Frann Rider in Treatment: 25 Active Inactive Electronic Signature(s) Signed: 03/14/2016 4:15:49 PM By: Gretta Cool RN, BSN, Kim RN, BSN Signed: 03/14/2016 4:33:45 PM By: Alric Quan Previous Signature: 03/13/2016 5:09:25 PM Version By: Alric Quan Entered By: Gretta Cool RN, BSN, Kim on 03/14/2016 16:15:48 Seth Mcintosh ([id number]) -------------------------------------------------------------------------------- Pain Assessment Details Patient Name: Seth Mcintosh Date of Service: 03/13/2016 10:45 AM Medical Record Number: [id number] Patient Account Number: [id number] Date of Birth/Sex: 1940/09/17 (75 y.o. Male) Treating RN: Ahmed Prima Primary Care Physician: [facility], Dossie Der Other Clinician: Referring Physician: [facility], [facility] Treating Physician/Extender: Frann Rider in Treatment: 25 Active Problems Location of Pain Severity and Description of Pain Patient Has Paino No Site Locations With Dressing Change: No Pain Management and Medication Current Pain Management: Electronic Signature(s) Signed: 03/13/2016 5:09:25 PM By: Alric Quan Entered By: Alric Quan on 03/13/2016 11:10:15 Seth Mcintosh ([id number]) -------------------------------------------------------------------------------- Patient/Caregiver Education Details Patient Name: Seth Mcintosh Date of Service: 03/13/2016 10:45 AM Medical Record Number:  [id number] Patient Account Number: [id number] Date of Birth/Gender: June 26, 1940 (75 y.o. Male) Treating RN: Ahmed Prima Primary Care Physician: [facility], Dossie Der Other Clinician: Referring Physician: Keith Rake Treating Physician/Extender: Frann Rider in Treatment: 25 Education Assessment Education Provided To: Patient Education Topics Provided Wound/Skin Impairment: Handouts: Other: Please contact our office if you have any questions or concerns. Methods: Explain/Verbal Responses: State content correctly Electronic Signature(s) Signed: 03/13/2016 5:09:25 PM By: Alric Quan Entered By: Alric Quan on 03/13/2016 11:33:05 Seth Mcintosh ([id number]) -------------------------------------------------------------------------------- Wound Assessment Details Patient Name: Seth Mcintosh Date of Service: 03/13/2016 10:45 AM Medical Record Number: [id number] Patient Account Number: [id number] Date of Birth/Sex: 27-Feb-1941 (74 y.o. Male) Treating RN: Ahmed Prima Primary Care Physician: [facility], Dossie Der Other Clinician: Referring Physician: [facility], Dossie Der Treating Physician/Extender: Frann Rider in Treatment: 25 Wound Status Wound Number: 1 Primary Diabetic Wound/Ulcer of the Lower Etiology: Extremity Wound Location: Right Lower Leg - Lateral Wound Open Wounding Event: Trauma Status: Date Acquired: 08/07/2015 Comorbid Glaucoma, Sleep Apnea, Coronary Weeks Of Treatment: 25 History: Artery Disease, Hypertension, Type II Clustered Wound: No Diabetes, Osteoarthritis Photos Photo Uploaded By: Alric Quan on 03/13/2016 11:38:06 Wound Measurements Length: (cm) 0 % Reduction i Width: (cm) 0 % Reduction i Depth: (cm) 0 Epithelializa Area: (  cm) 0 Tunneling: Volume: (cm) 0 Undermining: n Area: 100% n Volume: 100% tion: Large (67-100%) No No Wound Description Classification: Grade 1 Wound Margin: Thickened Exudate Amount: None Present Foul Odor After  Cleansing: No Wound Bed Granulation Amount: None Present (0%) Exposed Structure Necrotic Amount: None Present (0%) Fascia Exposed: No Fat Layer Exposed: No Tendon Exposed: No Muscle Exposed: No Joint Exposed: No DONTAE, DIANTONIO ([id number]) Bone Exposed: No Limited to Skin Breakdown Periwound Skin Texture Texture Color No Abnormalities Noted: No No Abnormalities Noted: No Callus: No Atrophie Blanche: No Crepitus: No Cyanosis: No Excoriation: No Ecchymosis: No Fluctuance: No Erythema: Yes Friable: No Hemosiderin Staining: No Induration: No Mottled: No Localized Edema: No Pallor: No Rash: No Rubor: No Scarring: No Temperature / Pain Moisture Temperature: No Abnormality No Abnormalities Noted: No Tenderness on Palpation: Yes Dry / Scaly: No Maceration: No Moist: No Wound Preparation Ulcer Cleansing: Rinsed/Irrigated with Saline Electronic Signature(s) Signed: 03/13/2016 5:09:25 PM By: Alric Quan Entered By: Alric Quan on 03/13/2016 11:25:26 Seth Mcintosh ([id number]) -------------------------------------------------------------------------------- [place] Details Patient Name: Seth Mcintosh Date of Service: 03/13/2016 10:45 AM Medical Record Number: [id number] Patient Account Number: [id number] Date of Birth/Sex: 01/18/1941 (75 y.o. Male) Treating RN: Ahmed Prima Primary Care Physician: [facility], Dossie Der Other Clinician: Referring Physician: [facility], [facility] Treating Physician/Extender: Frann Rider in Treatment: 25 Vital Signs Time Taken: 11:10 Temperature (F): 97.8 Height (in): 74 Pulse (bpm): 64 Weight (lbs): 265 Respiratory Rate (breaths/min): 18 Body Mass Index (BMI): 34 Blood Pressure (mmHg): 130/92 Reference Range: 80 - 120 mg / dl Electronic Signature(s) Signed: 03/13/2016 5:09:25 PM By: Alric Quan Entered By: Alric Quan on 03/13/2016 11:10:43

## 2016-04-03 ENCOUNTER — Ambulatory Visit (INDEPENDENT_AMBULATORY_CARE_PROVIDER_SITE_OTHER): Payer: Medicare Other

## 2016-04-03 VITALS — BP 130/80 | HR 60 | Temp 97.1°F | Ht 74.0 in | Wt 265.1 lb

## 2016-04-03 DIAGNOSIS — Z0000 Encounter for general adult medical examination without abnormal findings: Secondary | ICD-10-CM

## 2016-04-03 NOTE — Patient Instructions (Signed)
Seth Mcintosh , Thank you for taking time to come for your Medicare Wellness Visit. I appreciate your ongoing commitment to your health goals. Please review the following plan we discussed and let me know if I can assist you in the future.   These are the goals we discussed: Goals    . Increase water intake          Starting 04/03/16, I will continue to drink 6 glasses of water a day.       This is a list of the screening recommended for you and due dates:  Health Maintenance  Topic Date Due  . Eye exam for diabetics  07/18/1951  . Tetanus Vaccine  07/17/1960  . Colon Cancer Screening  07/18/1991  . Shingles Vaccine  07/17/2001  . Pneumonia vaccines (1 of 2 - PCV13) 07/18/2006  . Hemoglobin A1C  08/18/2016  . Complete foot exam   11/17/2016  . Flu Shot  Completed   Preventive Care for Adults  A healthy lifestyle and preventive care can promote health and wellness. Preventive health guidelines for adults include the following key practices.  . A routine yearly physical is a good way to check with your health care provider about your health and preventive screening. It is a chance to share any concerns and updates on your health and to receive a thorough exam.  . Visit your dentist for a routine exam and preventive care every 6 months. Brush your teeth twice a day and floss once a day. Good oral hygiene prevents tooth decay and gum disease.  . The frequency of eye exams is based on your age, health, family medical history, use  of contact lenses, and other factors. Follow your health care provider's ecommendations for frequency of eye exams.  . Eat a healthy diet. Foods like vegetables, fruits, whole grains, low-fat dairy products, and lean protein foods contain the nutrients you need without too many calories. Decrease your intake of foods high in solid fats, added sugars, and salt. Eat the right amount of calories for you. Get information about a proper diet from your health care  provider, if necessary.  . Regular physical exercise is one of the most important things you can do for your health. Most adults should get at least 150 minutes of moderate-intensity exercise (any activity that increases your heart rate and causes you to sweat) each week. In addition, most adults need muscle-strengthening exercises on 2 or more days a week.  Silver Sneakers may be a benefit available to you. To determine eligibility, you may visit the website: www.silversneakers.com or contact program at 910 259 3220 Mon-Fri between 8AM-8PM.   . Maintain a healthy weight. The body mass index (BMI) is a screening tool to identify possible weight problems. It provides an estimate of body fat based on height and weight. Your health care provider can find your BMI and can help you achieve or maintain a healthy weight.   For adults 20 years and older: ? A BMI below 18.5 is considered underweight. ? A BMI of 18.5 to 24.9 is normal. ? A BMI of 25 to 29.9 is considered overweight. ? A BMI of 30 and above is considered obese.   . Maintain normal blood lipids and cholesterol levels by exercising and minimizing your intake of saturated fat. Eat a balanced diet with plenty of fruit and vegetables. Blood tests for lipids and cholesterol should begin at age 71 and be repeated every 5 years. If your lipid or cholesterol levels are  high, you are over 50, or you are at high risk for heart disease, you may need your cholesterol levels checked more frequently. Ongoing high lipid and cholesterol levels should be treated with medicines if diet and exercise are not working.  . If you smoke, find out from your health care provider how to quit. If you do not use tobacco, please do not start.  . If you choose to drink alcohol, please do not consume more than 2 drinks per day. One drink is considered to be 12 ounces (355 mL) of beer, 5 ounces (148 mL) of wine, or 1.5 ounces (44 mL) of liquor.  . If you are 56-79 years  old, ask your health care provider if you should take aspirin to prevent strokes.  . Use sunscreen. Apply sunscreen liberally and repeatedly throughout the day. You should seek shade when your shadow is shorter than you. Protect yourself by wearing long sleeves, pants, a wide-brimmed hat, and sunglasses year round, whenever you are outdoors.  . Once a month, do a whole body skin exam, using a mirror to look at the skin on your back. Tell your health care provider of new moles, moles that have irregular borders, moles that are larger than a pencil eraser, or moles that have changed in shape or color.

## 2016-04-03 NOTE — Progress Notes (Signed)
Subjective:   Seth Mulberry Sr. is a 75 y.o. male who presents for an Initial Medicare Annual Wellness Visit.  Review of Systems  N/A  Cardiac Risk Factors include: advanced age (>39men, >82 women);diabetes mellitus;dyslipidemia;hypertension;male gender;obesity (BMI >30kg/m2)    Objective:    Today's Vitals   04/03/16 0848  BP: 130/80  Pulse: 60  Temp: 97.1 F (36.2 C)  TempSrc: Oral  Weight: 265 lb 2 oz (120.3 kg)  Height: 6\' 2"  (1.88 m)  PainSc: 0-No pain   Body mass index is 34.04 kg/m.  Current Medications (verified) Outpatient Encounter Prescriptions as of 04/03/2016  Medication Sig  . Ascorbic Acid (VITAMIN C) 1000 MG tablet Take 2,000 mg by mouth daily.  Marland Kitchen aspirin EC 81 MG tablet Take 81 mg by mouth daily.  Marland Kitchen azelastine (ASTELIN) 0.1 % nasal spray Place into both nostrils 2 (two) times daily. Use in each nostril as directed  . buPROPion (WELLBUTRIN XL) 300 MG 24 hr tablet TAKE 1 TABLET DAILY  . busPIRone (BUSPAR) 15 MG tablet Take 1 tablet (15 mg total) by mouth 2 (two) times daily.  . Cholecalciferol (VITAMIN D3) 5000 units CAPS Take 1 capsule by mouth daily.  . clonazePAM (KLONOPIN) 0.5 MG tablet Take 1 tablet (0.5 mg total) by mouth 2 (two) times daily as needed. anxiety  . Coenzyme Q10 (CO Q 10) 100 MG CAPS Take 1 capsule by mouth daily.  . fish oil-omega-3 fatty acids 1000 MG capsule Take 2 g by mouth daily.  . irbesartan (AVAPRO) 150 MG tablet TAKE 1 TABLET DAILY  . metFORMIN (GLUCOPHAGE) 1000 MG tablet TAKE 1 TABLET TWICE A DAY WITH MEALS  . Multiple Vitamin (MULTIVITAMIN WITH MINERALS) TABS Take 1 tablet by mouth daily.  . Multiple Vitamins-Calcium (CALCI-MAX PO) Take 1 tablet by mouth daily. Reported on 06/02/2015  . pantoprazole (PROTONIX) 40 MG tablet Take 1 tablet (40 mg total) by mouth daily. (Patient taking differently: Take 20 mg by mouth daily. )  . Pumpkin Seed-Soy Germ (AZO BLADDER CONTROL/GO-LESS PO) Take 2 capsules by mouth 3 (three) times  daily.  . sildenafil (VIAGRA) 50 MG tablet Take 1 tablet (50 mg total) by mouth daily as needed for erectile dysfunction. 1 tablet po 30 mins prior to sexual activity  . Suvorexant (BELSOMRA) 10 MG TABS Take 1 tablet by mouth at bedtime as needed.  Marland Kitchen VYTORIN 10-20 MG tablet TAKE 1 TABLET DAILY AT 6 P.M  . fluticasone (FLONASE) 50 MCG/ACT nasal spray Place 2 sprays into both nostrils daily. (Patient not taking: Reported on 04/03/2016)  . solifenacin (VESICARE) 10 MG tablet Take 1 tablet (10 mg total) by mouth daily.   No facility-administered encounter medications on file as of 04/03/2016.     Allergies (verified) Patient has no known allergies.   History: Past Medical History:  Diagnosis Date  . Arthritis    rheumatoid   . Coronary artery disease   . Depression   . Diabetes mellitus without complication ([place])   . Diverticulosis   . GERD (gastroesophageal reflux disease)   . Hypertension   . Leg lesion    RIGHT WOUND CARE  . Localized osteoarthritis of right knee 05/21/2012  . Reflux   . Sleep apnea    CPAP  . Sleeping difficulty    takes med nightly  . Urgency of urination    Past Surgical History:  Procedure Laterality Date  . BACK SURGERY    . CATARACT EXTRACTION W/PHACO Right 12/16/2015   Procedure: CATARACT EXTRACTION  PHACO AND INTRAOCULAR LENS PLACEMENT (IOC);  Surgeon: Eulogio Bear, MD;  Location: [facility] ORS;  Service: Ophthalmology;  Laterality: Right;  [place] 01:16 AP% 8.7 CDE 6.66 fluid pack lot # [id number]  . CATARACT EXTRACTION W/PHACO Left 02/03/2016   Procedure: CATARACT EXTRACTION PHACO AND INTRAOCULAR LENS PLACEMENT ([place]);  Surgeon: Eulogio Bear, MD;  Location: [facility] ORS;  Service: Ophthalmology;  Laterality: Left;  Lot# [id number] H [place]: 00:52.3 AP%: 13.8 CDE: 7.21  . CORONARY ANGIOPLASTY     STENT  . CORONARY ARTERY BYPASS GRAFT  2004   x4 vessels  . FOOT SURGERY     x2  . FOOT SURGERY     x2  . JOINT REPLACEMENT    . TOTAL KNEE ARTHROPLASTY   05/21/2012   Procedure: TOTAL KNEE ARTHROPLASTY;  Surgeon: Johnny Bridge, MD;  Location: [facility];  Service: Orthopedics;  Laterality: Right;  . TRANSURETHRAL RESECTION OF PROSTATE     Family History  Problem Relation Age of Onset  . Bladder Cancer Brother   . Heart disease Father   . Kidney disease Neg Hx   . Prostate cancer Neg Hx    Social History   Occupational History  . Not on file.   Social History Main Topics  . Smoking status: Former Smoker    Quit date: 05/14/1992  . Smokeless tobacco: Never Used  . Alcohol use 0.6 oz/week    1 Glasses of wine per week  . Drug use: No  . Sexual activity: Not on file   Tobacco Counseling Counseling given: Not Answered   Activities of Daily Living In your present state of health, do you have any difficulty performing the following activities: 04/03/2016 09/10/2015  Hearing? Y N  Vision? N Y  Difficulty concentrating or making decisions? N N  Walking or climbing stairs? N N  Dressing or bathing? N N  Doing errands, shopping? N N  Preparing Food and eating ? N -  Using the Toilet? N -  In the past six months, have you accidently leaked urine? Y -  Do you have problems with loss of bowel control? N -  Managing your Medications? N -  Managing your Finances? N -  Housekeeping or managing your Housekeeping? N -  Some recent data might be hidden    Immunizations and Health Maintenance Immunization History  Administered Date(s) Administered  . Influenza-Unspecified 01/03/2016   There are no preventive care reminders to display for this patient.  Patient Care Team: Roselee Nova, MD as PCP - General (Family Medicine) Nori Riis, PA-C as Physician Assistant (Urology) Eulogio Bear, MD as Consulting Physician (Ophthalmology)  Indicate any recent Medical Services you may have received from other than [facility] providers in the past year (date may be approximate).    Assessment:   This is a routine wellness examination  for Seth Mcintosh.   Hearing/Vision screen Vision Screening Comments: Pt sees Dr Seth Mcintosh for vision checks yearly.  Dietary issues and exercise activities discussed: Current Exercise Habits: Home exercise routine, Type of exercise: strength training/weights;stretching;walking, Time (Minutes): > 60, Frequency (Times/Week): 5, Weekly Exercise (Minutes/Week): 0, Intensity: Mild  Goals    . Increase water intake          Starting 04/03/16, I will continue to drink 6 glasses of water a day.      Depression Screen PHQ 2/9 Scores 04/03/2016 09/10/2015 06/14/2015 02/12/2015  PHQ - 2 Score 1 0 0 0    Fall Risk Fall Risk  04/03/2016  09/10/2015 06/14/2015 02/12/2015 01/08/2015  Falls in the past year? Yes No No No No  Number falls in past yr: 1 - - - -  Injury with Fall? Yes - - - -  Follow up Falls prevention discussed - - - -    Cognitive Function:     6CIT Screen 04/03/2016  What Year? 0 points  What month? 0 points  What time? 0 points  Count back from 20 0 points  Months in reverse 0 points  Repeat phrase 2 points  Total Score 2    Screening Tests Health Maintenance  Topic Date Due  . HEMOGLOBIN A1C  08/18/2016  . FOOT EXAM  11/17/2016  . OPHTHALMOLOGY EXAM  02/22/2017  . COLONOSCOPY  04/24/2018  . TETANUS/TDAP  02/11/2023  . INFLUENZA VACCINE  Completed  . ZOSTAVAX  Completed  . PNA vac Low Risk Adult  Completed        Plan:  I have personally reviewed and addressed the Medicare Annual Wellness questionnaire and have noted the following in the patient's chart:  A. Medical and social history B. Use of alcohol, tobacco or illicit drugs  C. Current medications and supplements D. Functional ability and status E.  Nutritional status F.  Physical activity G. Advance directives H. List of other physicians I.  Hospitalizations, surgeries, and ER visits in previous 12 months J.  [place] such as hearing and vision if needed, cognitive and depression L. Referrals and  appointments - none  In addition, I have reviewed and discussed with patient certain preventive protocols, quality metrics, and best practice recommendations. A written personalized care plan for preventive services as well as general preventive health recommendations were provided to patient.  See attached scanned questionnaire for additional information.   Signed,  Fabio Neighbors, LPN Nurse Health Advisor   MD Recommendation: none    I, as supervising physician, have reviewed the nurse health advisor's Medicare Wellness Visit note for this patient and concur with the findings and recommendations listed above.  Signed Syed Asad A. Manuella Ghazi MD Attending Physician.

## 2016-04-04 ENCOUNTER — Telehealth: Payer: Self-pay | Admitting: Family Medicine

## 2016-04-04 NOTE — Telephone Encounter (Signed)
PT S NEEDING REFILLS ON :  METFORMIN 1000MG  PANTOPRAZOLE 40MG  SILDENAFIL 50MG  SUVOREXANT 10MG  VYTORIN 10-20MG  CLONAZEPAN 05MG   [facility]. HE HAS AN APPT  ON 04-25-16

## 2016-04-11 MED ORDER — METFORMIN HCL 1000 MG PO TABS: 1000.0000 mg | ORAL_TABLET | Freq: Two times a day (BID) | ORAL | 0 refills | Status: AC

## 2016-04-11 MED ORDER — PANTOPRAZOLE SODIUM 40 MG PO TBEC: 40.0000 mg | DELAYED_RELEASE_TABLET | Freq: Every day | ORAL | 0 refills | Status: AC

## 2016-04-11 NOTE — Addendum Note (Signed)
Addended by: Barnie Alderman L on: 04/11/2016 10:48 AM   Modules accepted: Orders

## 2016-04-11 NOTE — Telephone Encounter (Signed)
Medication has been refilled and sent to [facility] 

## 2016-04-25 ENCOUNTER — Ambulatory Visit: Payer: Medicare Other | Admitting: Family Medicine

## 2016-05-01 ENCOUNTER — Other Ambulatory Visit: Payer: Self-pay | Admitting: Family Medicine

## 2016-05-02 ENCOUNTER — Ambulatory Visit: Payer: Medicare Other | Admitting: Family Medicine

## 2016-05-02 IMAGING — CR DG TIBIA/FIBULA 2V*R*
1 series · 4 of 4 positions shown · non-contrast
Comparison: Right knee radiographs performed 05/21/2012

CLINICAL DATA: Status post injury to the right leg, with right
lower leg pain. Initial encounter.

EXAM:
RIGHT TIBIA AND FIBULA - 2 VIEW

[Series 1: x tib-fib lat right · 0.14mm/px · 4 of 4 slices shown]
[im 1/4]
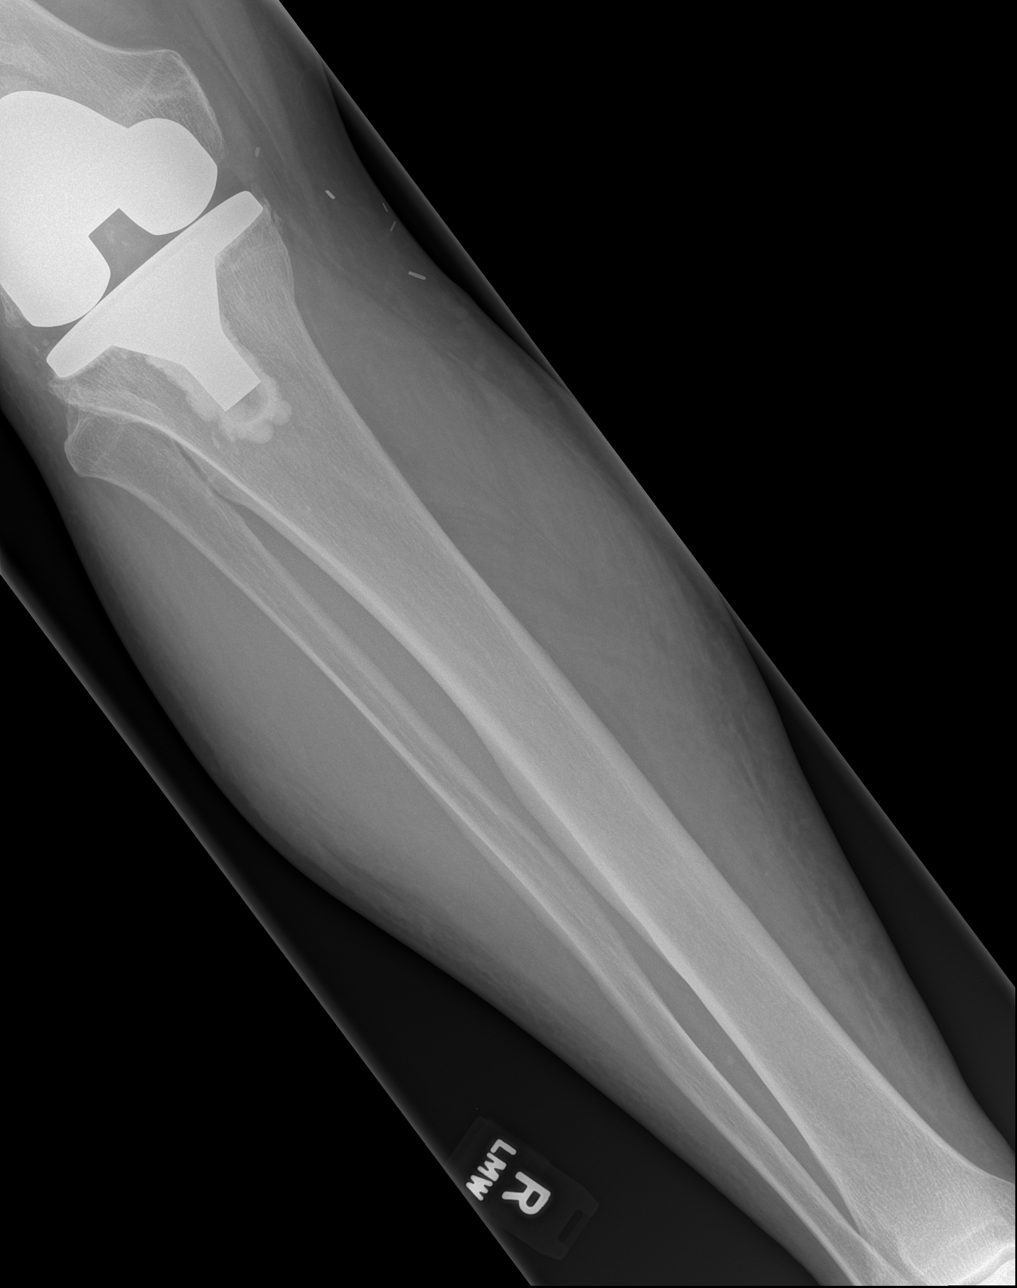
[im 2/4]
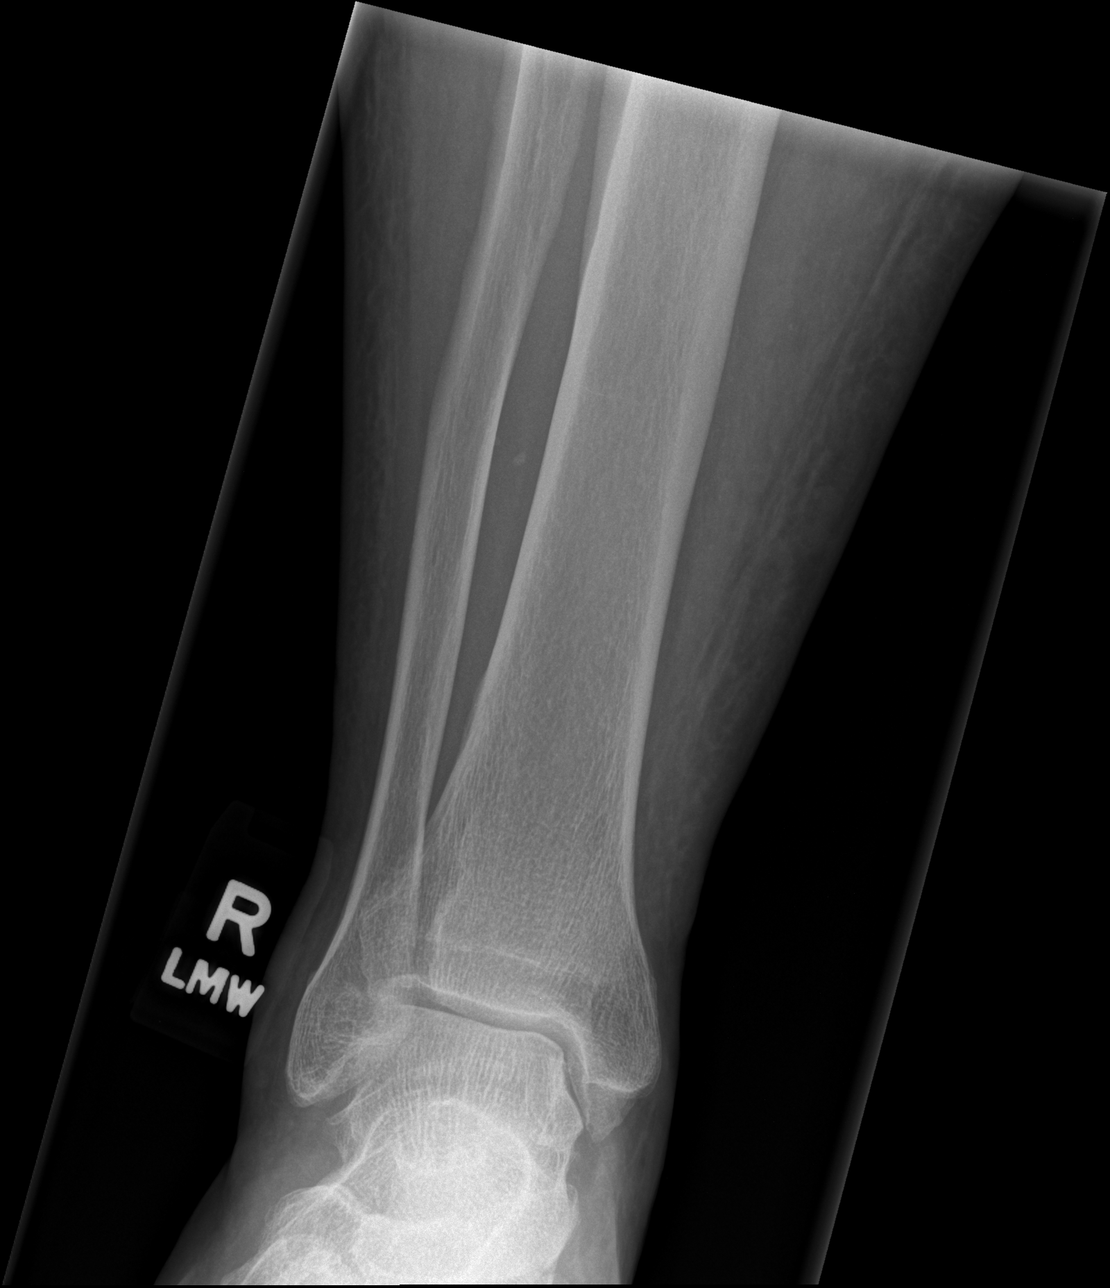
[im 3/4]
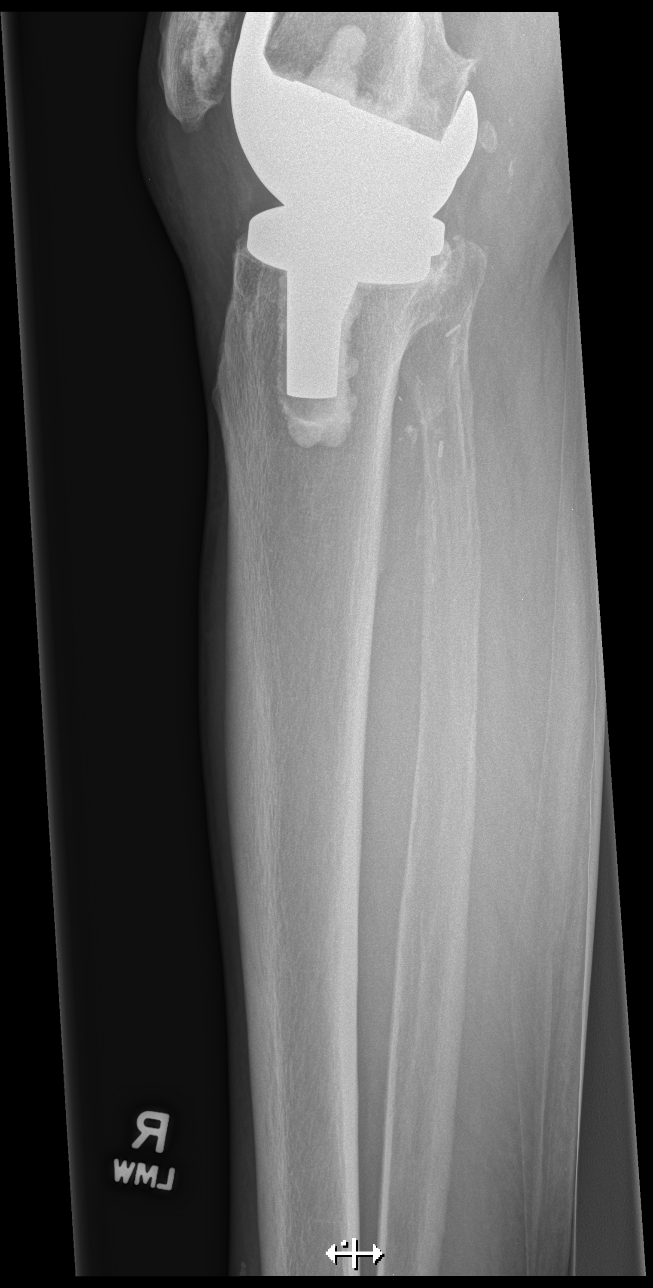
[im 4/4]
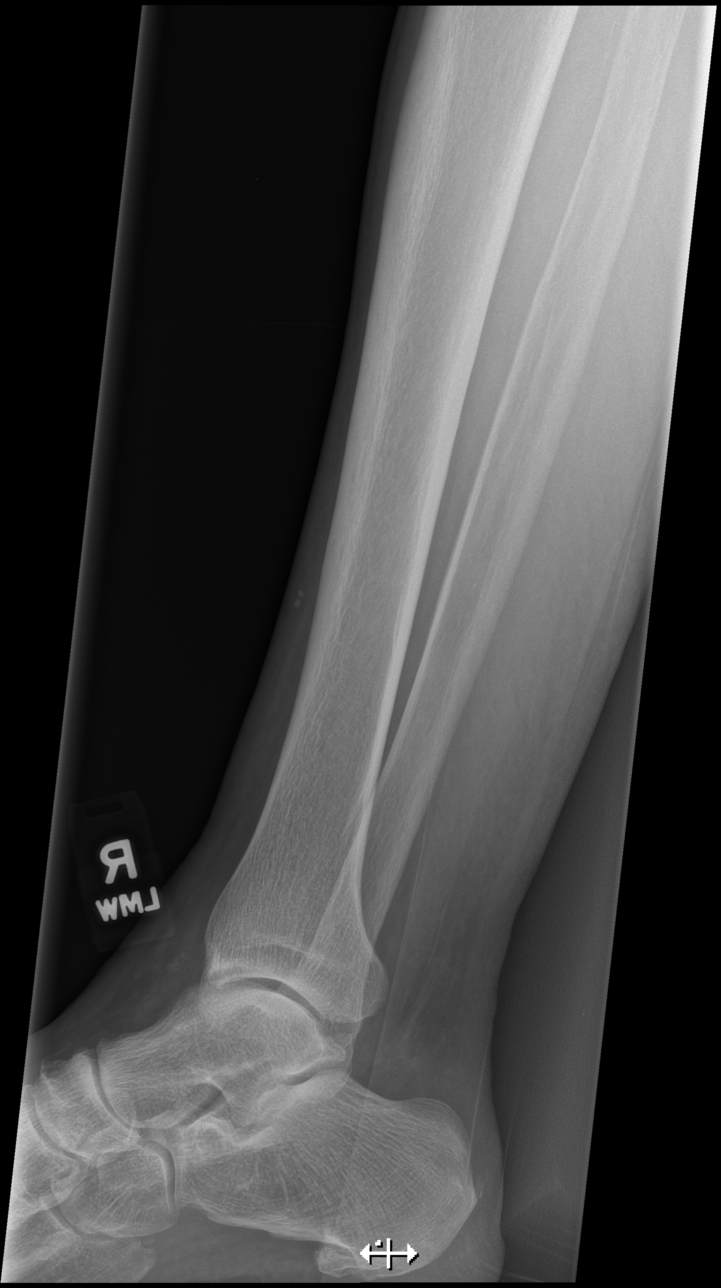

[4 of 4 positions shown; findings below may reference images not displayed]

FINDINGS: There is no evidence of fracture or dislocation. The tibia and
fibula appear grossly intact. The ankle mortise is incompletely
assessed, but appears grossly unremarkable. The patient's total knee
arthroplasty is unremarkable in appearance, without evidence of
significant loosening, though not fully imaged on this study.
Plantar and posterior calcaneal spurs are seen. Scattered
postoperative change is noted medial to the right knee.
IMPRESSION: No evidence of fracture or dislocation. Total knee arthroplasty is
unremarkable in appearance, though not fully imaged on this study.

## 2016-05-10 ENCOUNTER — Ambulatory Visit: Payer: Medicare Other | Admitting: Family Medicine

## 2016-05-18 DIAGNOSIS — E7800 Pure hypercholesterolemia, unspecified: Secondary | ICD-10-CM | POA: Diagnosis not present

## 2016-05-18 DIAGNOSIS — I2510 Atherosclerotic heart disease of native coronary artery without angina pectoris: Secondary | ICD-10-CM | POA: Diagnosis not present

## 2016-05-18 DIAGNOSIS — Z125 Encounter for screening for malignant neoplasm of prostate: Secondary | ICD-10-CM | POA: Diagnosis not present

## 2016-05-18 DIAGNOSIS — I10 Essential (primary) hypertension: Secondary | ICD-10-CM | POA: Diagnosis not present

## 2016-05-18 DIAGNOSIS — E119 Type 2 diabetes mellitus without complications: Secondary | ICD-10-CM | POA: Diagnosis not present

## 2016-05-18 DIAGNOSIS — F411 Generalized anxiety disorder: Secondary | ICD-10-CM | POA: Diagnosis not present

## 2016-05-18 DIAGNOSIS — Z79899 Other long term (current) drug therapy: Secondary | ICD-10-CM | POA: Diagnosis not present

## 2016-07-25 IMAGING — US US EXTREM LOW*L* LIMITED
1 series · 14 of 20 positions shown · non-contrast
Comparison: None in PACs

CLINICAL DATA: Evaluate patient for possible hematoma in the area
of mid lateral lower leg wound ; patient reports a drained a large
amount of blood 4 weeks ago; original injury was 6 weeks ago

EXAM:
ULTRASOUND left LOWER EXTREMITY LIMITED
TECHNIQUE: Ultrasound examination of the lower extremity soft tissues was
performed in the area of clinical concern.

[Series 1: us extrem low*left* limited · 0.08mm/px · 14 of 20 slices shown]
[im 1/20]
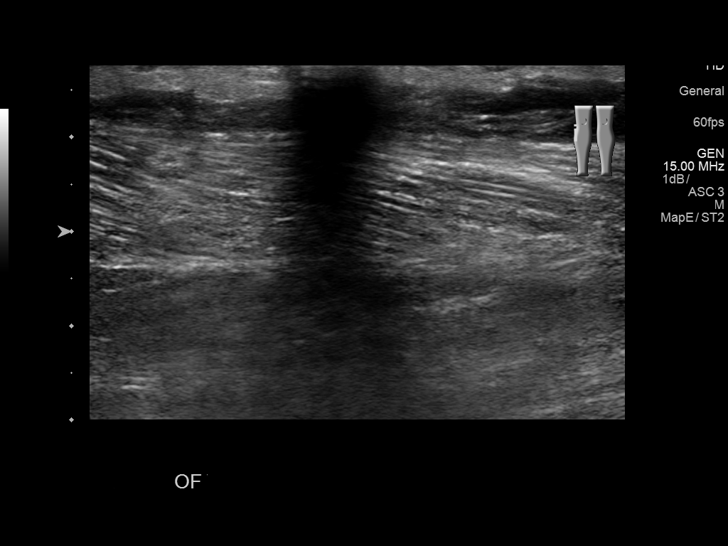
[im 3/20]
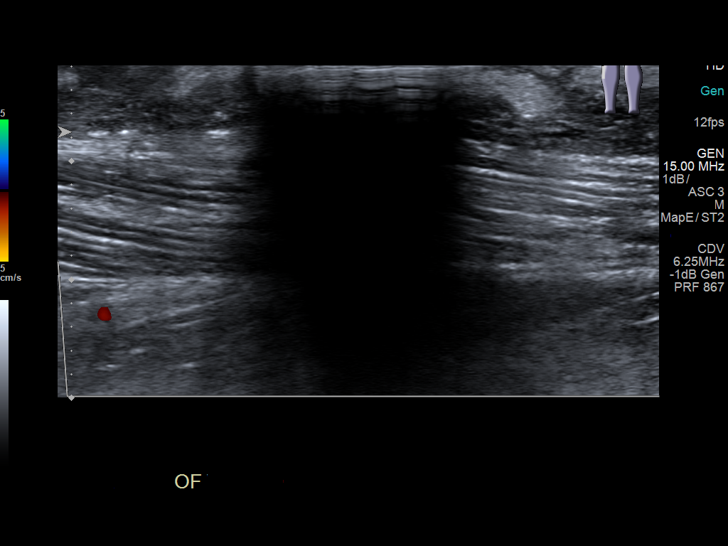
[im 4/20]
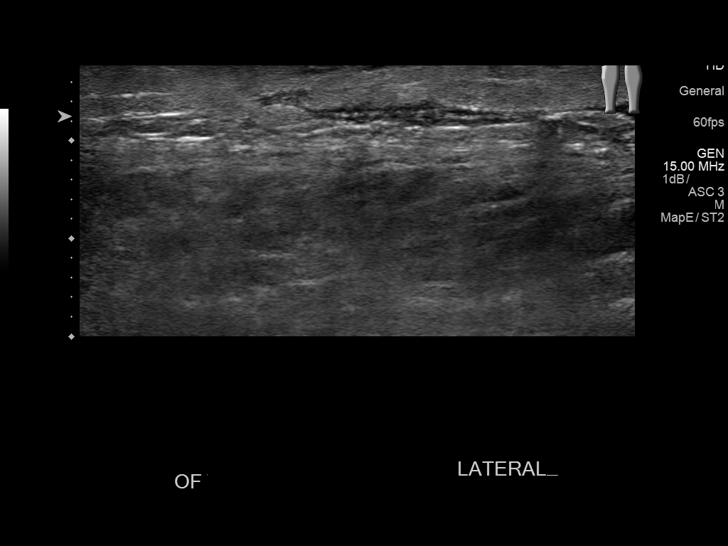
[im 6/20]
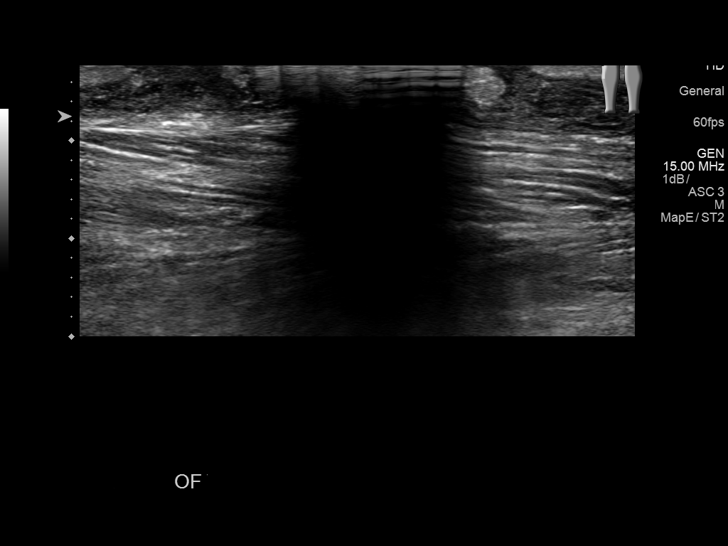
[im 7/20]
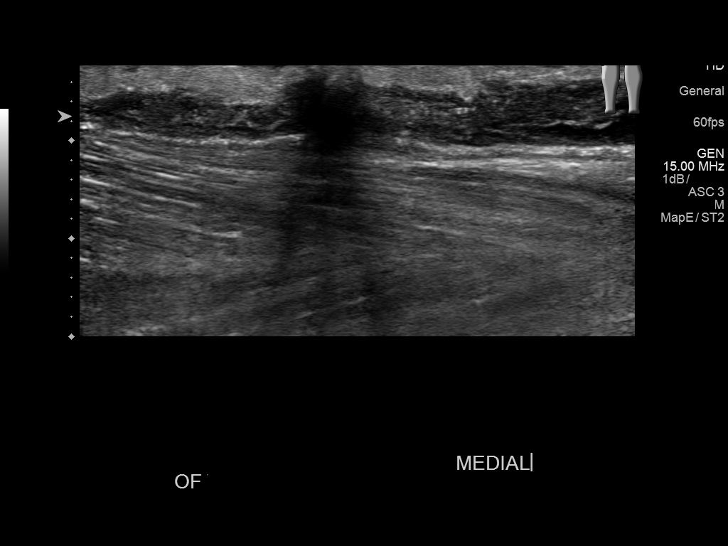
[im 8/20]
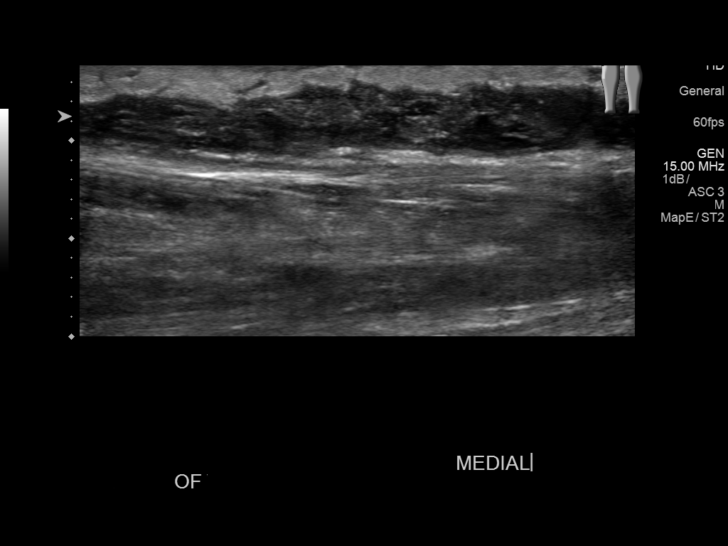
[im 10/20]
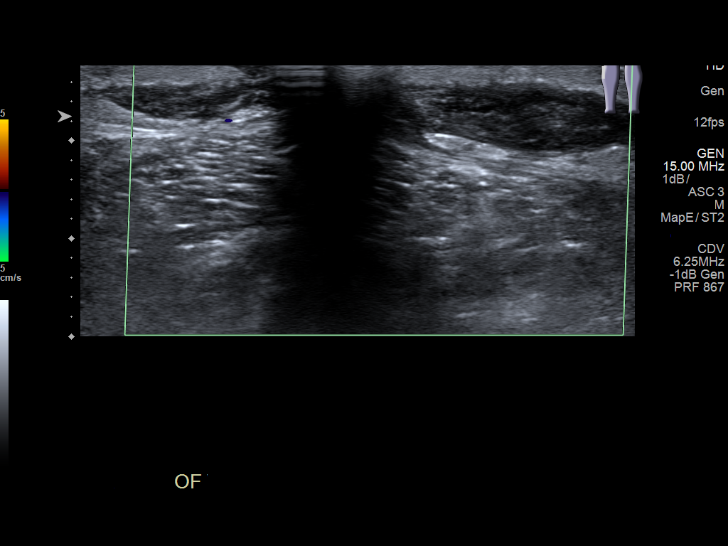
[im 11/20]
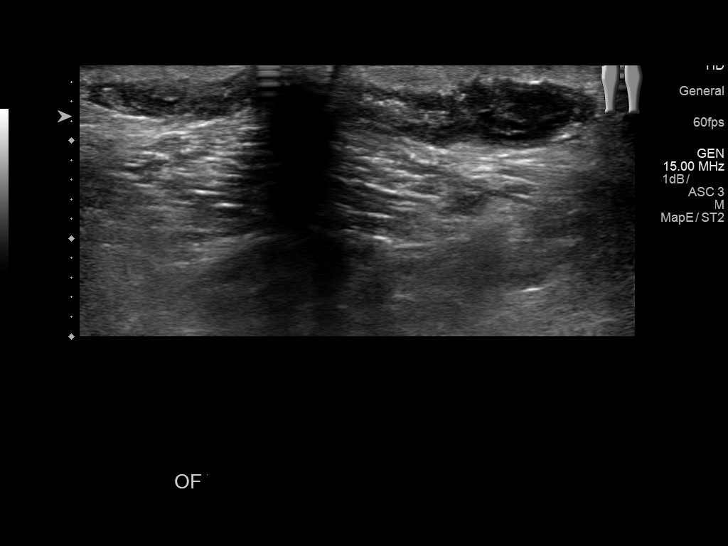
[im 13/20]
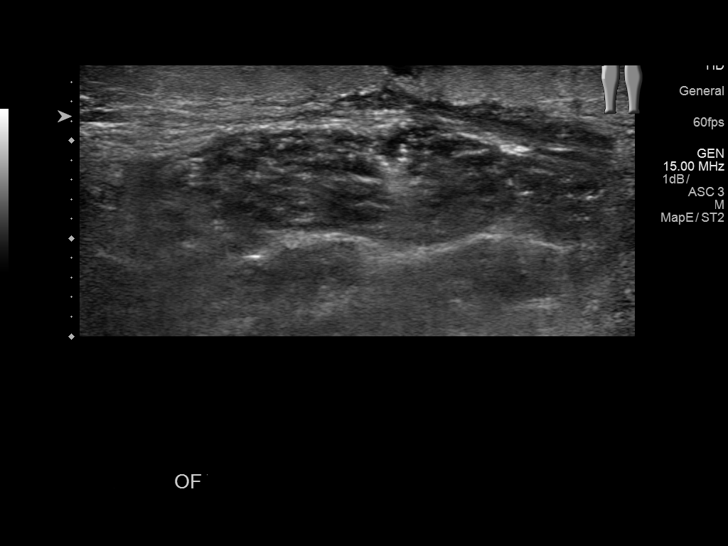
[im 14/20]
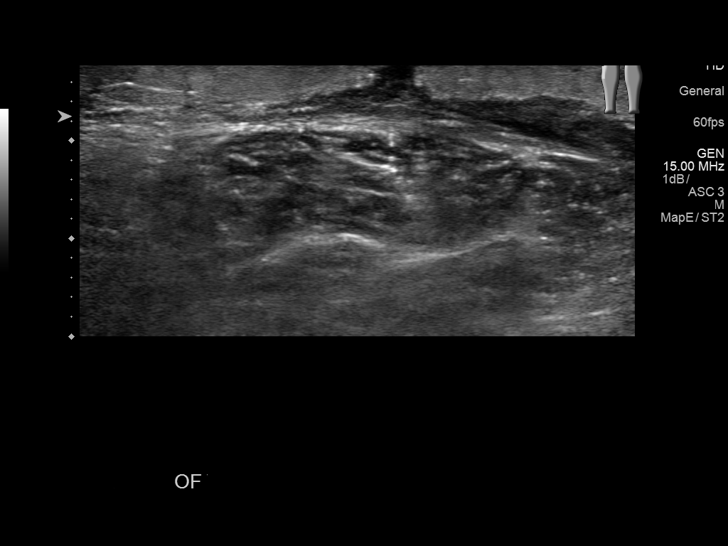
[im 16/20]
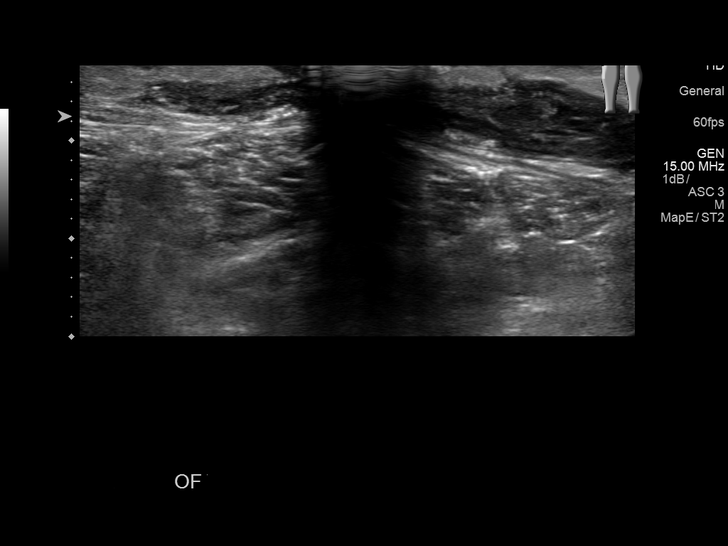
[im 17/20]
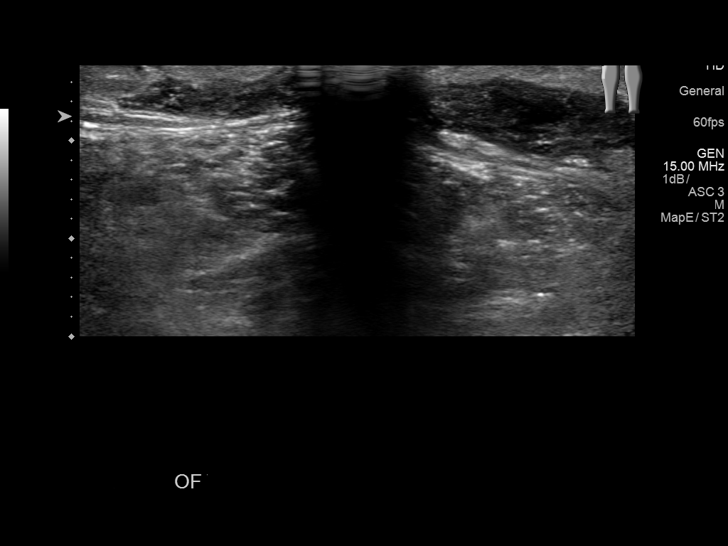
[im 18/20]
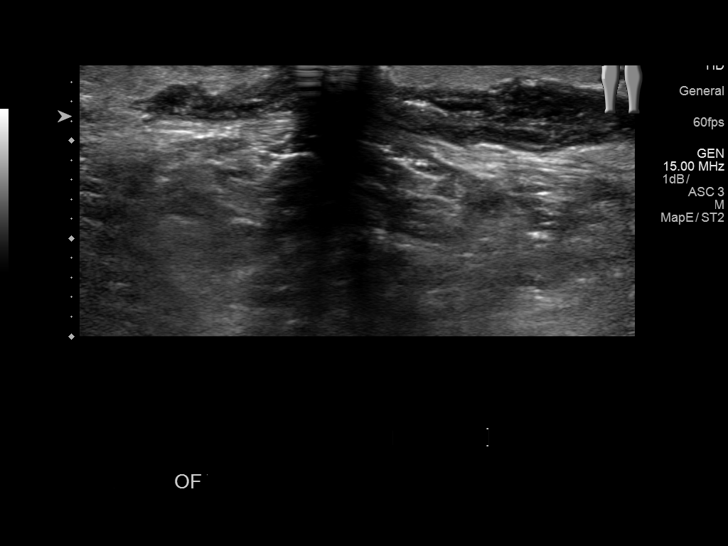
[im 20/20]
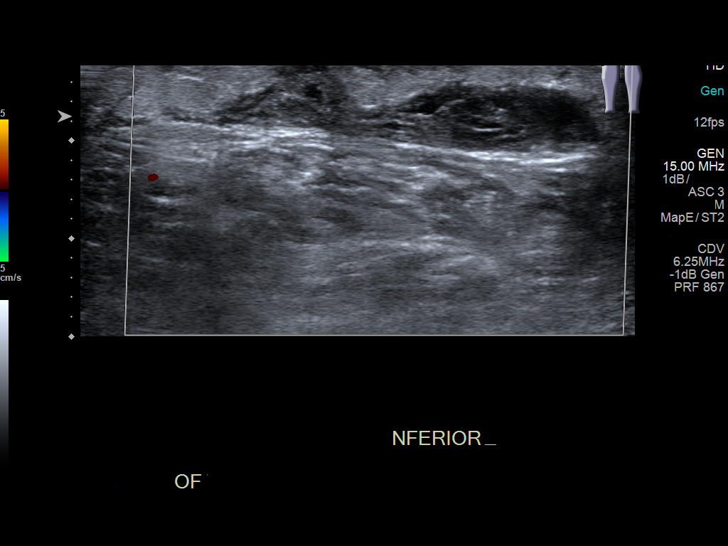

[14 of 20 positions shown; findings below may reference images not displayed]

FINDINGS: Deep to the area of a known cutaneous wound no hematoma is observed.
A small amount of low-density material is present in the
subcutaneous tissue which may reflect organizing blood products or
complex seroma.
IMPRESSION: No discrete drainable hematoma is observed in the area of clinical
concern in the left lower leg.

## 2016-08-02 DIAGNOSIS — M25561 Pain in right knee: Secondary | ICD-10-CM | POA: Diagnosis not present

## 2016-08-02 DIAGNOSIS — M1712 Unilateral primary osteoarthritis, left knee: Secondary | ICD-10-CM | POA: Diagnosis not present

## 2016-08-21 DIAGNOSIS — R3912 Poor urinary stream: Secondary | ICD-10-CM | POA: Diagnosis not present

## 2016-08-21 DIAGNOSIS — N401 Enlarged prostate with lower urinary tract symptoms: Secondary | ICD-10-CM | POA: Diagnosis not present

## 2016-08-21 DIAGNOSIS — N50819 Testicular pain, unspecified: Secondary | ICD-10-CM | POA: Diagnosis not present

## 2016-08-21 DIAGNOSIS — R1030 Lower abdominal pain, unspecified: Secondary | ICD-10-CM | POA: Diagnosis not present

## 2016-08-22 ENCOUNTER — Ambulatory Visit: Payer: TRICARE For Life (TFL) | Admitting: Podiatry

## 2016-08-30 ENCOUNTER — Ambulatory Visit: Payer: TRICARE For Life (TFL) | Admitting: Podiatry

## 2016-09-21 DIAGNOSIS — I10 Essential (primary) hypertension: Secondary | ICD-10-CM | POA: Diagnosis not present

## 2016-09-21 DIAGNOSIS — I25810 Atherosclerosis of coronary artery bypass graft(s) without angina pectoris: Secondary | ICD-10-CM | POA: Diagnosis not present

## 2016-09-21 DIAGNOSIS — E782 Mixed hyperlipidemia: Secondary | ICD-10-CM | POA: Diagnosis not present

## 2016-09-21 DIAGNOSIS — G4733 Obstructive sleep apnea (adult) (pediatric): Secondary | ICD-10-CM | POA: Diagnosis not present

## 2016-10-02 DIAGNOSIS — R3912 Poor urinary stream: Secondary | ICD-10-CM | POA: Diagnosis not present

## 2016-10-02 DIAGNOSIS — N401 Enlarged prostate with lower urinary tract symptoms: Secondary | ICD-10-CM | POA: Diagnosis not present

## 2016-10-02 DIAGNOSIS — N50819 Testicular pain, unspecified: Secondary | ICD-10-CM | POA: Diagnosis not present

## 2016-10-02 DIAGNOSIS — R1030 Lower abdominal pain, unspecified: Secondary | ICD-10-CM | POA: Diagnosis not present

## 2016-10-02 DIAGNOSIS — N138 Other obstructive and reflux uropathy: Secondary | ICD-10-CM | POA: Diagnosis not present

## 2016-11-09 DIAGNOSIS — E7800 Pure hypercholesterolemia, unspecified: Secondary | ICD-10-CM | POA: Diagnosis not present

## 2016-11-09 DIAGNOSIS — Z79899 Other long term (current) drug therapy: Secondary | ICD-10-CM | POA: Diagnosis not present

## 2016-11-09 DIAGNOSIS — E119 Type 2 diabetes mellitus without complications: Secondary | ICD-10-CM | POA: Diagnosis not present

## 2016-11-10 DIAGNOSIS — M25511 Pain in right shoulder: Secondary | ICD-10-CM | POA: Diagnosis not present

## 2016-11-16 ENCOUNTER — Other Ambulatory Visit: Payer: Self-pay | Admitting: Urology

## 2016-11-16 DIAGNOSIS — F5101 Primary insomnia: Secondary | ICD-10-CM | POA: Diagnosis not present

## 2016-11-16 DIAGNOSIS — E7800 Pure hypercholesterolemia, unspecified: Secondary | ICD-10-CM | POA: Diagnosis not present

## 2016-11-16 DIAGNOSIS — Z79899 Other long term (current) drug therapy: Secondary | ICD-10-CM | POA: Diagnosis not present

## 2016-11-16 DIAGNOSIS — E119 Type 2 diabetes mellitus without complications: Secondary | ICD-10-CM | POA: Diagnosis not present

## 2016-11-16 DIAGNOSIS — G4733 Obstructive sleep apnea (adult) (pediatric): Secondary | ICD-10-CM | POA: Diagnosis not present

## 2016-11-16 DIAGNOSIS — F419 Anxiety disorder, unspecified: Secondary | ICD-10-CM | POA: Diagnosis not present

## 2016-11-16 DIAGNOSIS — I2510 Atherosclerotic heart disease of native coronary artery without angina pectoris: Secondary | ICD-10-CM | POA: Diagnosis not present

## 2016-11-16 DIAGNOSIS — I10 Essential (primary) hypertension: Secondary | ICD-10-CM | POA: Diagnosis not present

## 2016-11-16 DIAGNOSIS — Z9989 Dependence on other enabling machines and devices: Secondary | ICD-10-CM | POA: Diagnosis not present

## 2016-11-16 DIAGNOSIS — Z125 Encounter for screening for malignant neoplasm of prostate: Secondary | ICD-10-CM | POA: Diagnosis not present

## 2016-12-15 DIAGNOSIS — M25511 Pain in right shoulder: Secondary | ICD-10-CM | POA: Diagnosis not present

## 2016-12-26 DIAGNOSIS — G4733 Obstructive sleep apnea (adult) (pediatric): Secondary | ICD-10-CM | POA: Diagnosis not present

## 2016-12-26 DIAGNOSIS — J301 Allergic rhinitis due to pollen: Secondary | ICD-10-CM | POA: Diagnosis not present

## 2017-01-19 DIAGNOSIS — Z23 Encounter for immunization: Secondary | ICD-10-CM | POA: Diagnosis not present

## 2017-02-12 ENCOUNTER — Ambulatory Visit: Payer: TRICARE For Life (TFL) | Admitting: Podiatry

## 2017-02-14 ENCOUNTER — Ambulatory Visit: Payer: TRICARE For Life (TFL) | Admitting: Orthotics

## 2017-02-20 ENCOUNTER — Encounter (INDEPENDENT_AMBULATORY_CARE_PROVIDER_SITE_OTHER): Payer: Self-pay | Admitting: Vascular Surgery

## 2017-02-20 ENCOUNTER — Ambulatory Visit (INDEPENDENT_AMBULATORY_CARE_PROVIDER_SITE_OTHER): Payer: Medicare Other | Admitting: Vascular Surgery

## 2017-02-20 VITALS — BP 143/85 | HR 58 | Resp 17 | Ht 74.0 in | Wt 279.0 lb

## 2017-02-20 DIAGNOSIS — E785 Hyperlipidemia, unspecified: Secondary | ICD-10-CM | POA: Diagnosis not present

## 2017-02-20 DIAGNOSIS — I83813 Varicose veins of bilateral lower extremities with pain: Secondary | ICD-10-CM | POA: Diagnosis not present

## 2017-02-20 DIAGNOSIS — E119 Type 2 diabetes mellitus without complications: Secondary | ICD-10-CM

## 2017-02-20 DIAGNOSIS — M79605 Pain in left leg: Secondary | ICD-10-CM | POA: Diagnosis not present

## 2017-02-20 DIAGNOSIS — I10 Essential (primary) hypertension: Secondary | ICD-10-CM

## 2017-02-20 DIAGNOSIS — M79604 Pain in right leg: Secondary | ICD-10-CM | POA: Diagnosis not present

## 2017-02-20 MED ORDER — TRAMADOL HCL 50 MG PO TABS: ORAL_TABLET | ORAL | 0 refills | Status: DC

## 2017-02-20 NOTE — Progress Notes (Signed)
Subjective:    Patient ID: Seth Mulberry Sr., male    DOB: 1940/10/28, 76 y.o.   MRN: [id number] Chief Complaint  Patient presents with  . New Patient (Initial Visit)    Left leg hurts in his veins   Patient presents self-referred for evaluation of painful varicose veins. Patient states the discomfort associated with his varicosities have worsened over the last few months. The patient has seen a decline in his activity level due to this discomfort. The patient states his left leg is more symptomatic than his right. His discomfort has progressively worsened which has caused him to seek medical evaluation. The patient also notes cramping to the buttocks and thigh of his left leg with activity.His discomfort has progress to the point he is unable to function on a daily basis. Patient denies any trauma or surgery to the lower extremity. Patient denies any DVT history. The patient does wear medical grade 1 compression stockings on a daily basis and he engages and elevation. This has provided minimal relief to the pain in his varicosities. Patient denies any fever, nausea or vomiting.   Review of Systems  Constitutional: Negative.   HENT: Negative.   Eyes: Negative.   Respiratory: Negative.   Cardiovascular:       Painful varicosities to the lower extremity Cramping to the buttocks and thigh  Gastrointestinal: Negative.   Endocrine: Negative.   Genitourinary: Negative.   Musculoskeletal: Negative.   Skin: Negative.   Allergic/Immunologic: Negative.   Neurological: Negative.   Hematological: Negative.   Psychiatric/Behavioral: Negative.       Objective:   Physical Exam  Constitutional: He is oriented to person, place, and time. He appears well-developed and well-nourished. No distress.  HENT:  Head: Normocephalic and atraumatic.  Eyes: Pupils are equal, round, and reactive to light. Conjunctivae are normal.  Neck: Normal range of motion.  Cardiovascular: Normal rate, regular rhythm,  normal heart sounds and intact distal pulses.   Pulses:      Radial pulses are 2+ on the right side, and 2+ on the left side.  hard to palpate pedal pulses  Pulmonary/Chest: Effort normal.  Musculoskeletal: Normal range of motion. He exhibits edema (ild bilateral lower extremity edema. However the pati was wearing his medical grade 1 compression stockings at the time of the exam).  Neurological: He is alert and oriented to person, place, and time.  Skin: Skin is warm and dry. He is not diaphoretic.  Here is no cellulitis. There is no stasis dermatitis. There is no skin changes.  Psychiatric: He has a normal mood and affect. His behavior is normal. Judgment and thought content normal.  Vitals reviewed.  BP (!) 143/85 (BP Location: Right Arm)   Pulse (!) 58   Resp 17   Ht 6\' 2"  (1.88 m)   Wt 279 lb (126.6 kg)   BMI 35.82 kg/m   Past Medical History:  Diagnosis Date  . Arthritis    rheumatoid   . Coronary artery disease   . Depression   . Diabetes mellitus without complication ([place])   . Diverticulosis   . GERD (gastroesophageal reflux disease)   . Hypertension   . Leg lesion    RIGHT WOUND CARE  . Localized osteoarthritis of right knee 05/21/2012  . Reflux   . Sleep apnea    CPAP  . Sleeping difficulty    takes med nightly  . Urgency of urination    Social History   Social History  . Marital status:  Widowed    Spouse name: N/A  . Number of children: N/A  . Years of education: N/A   Occupational History  . Not on file.   Social History Main Topics  . Smoking status: Former Smoker    Quit date: 05/14/1992  . Smokeless tobacco: Never Used  . Alcohol use 0.6 oz/week    1 Glasses of wine per week  . Drug use: No  . Sexual activity: Not on file   Other Topics Concern  . Not on file   Social History Narrative  . No narrative on file   Past Surgical History:  Procedure Laterality Date  . BACK SURGERY    . CATARACT EXTRACTION W/PHACO Right 12/16/2015    Procedure: CATARACT EXTRACTION PHACO AND INTRAOCULAR LENS PLACEMENT (IOC);  Surgeon: Eulogio Bear, MD;  Location: [facility] ORS;  Service: Ophthalmology;  Laterality: Right;  [place] 01:16 AP% 8.7 CDE 6.66 fluid pack lot # [id number]  . CATARACT EXTRACTION W/PHACO Left 02/03/2016   Procedure: CATARACT EXTRACTION PHACO AND INTRAOCULAR LENS PLACEMENT ([place]);  Surgeon: Eulogio Bear, MD;  Location: [facility] ORS;  Service: Ophthalmology;  Laterality: Left;  Lot# [id number] H [place]: 00:52.3 AP%: 13.8 CDE: 7.21  . CORONARY ANGIOPLASTY     STENT  . CORONARY ARTERY BYPASS GRAFT  2004   x4 vessels  . FOOT SURGERY     x2  . FOOT SURGERY     x2  . JOINT REPLACEMENT    . TOTAL KNEE ARTHROPLASTY  05/21/2012   Procedure: TOTAL KNEE ARTHROPLASTY;  Surgeon: Johnny Bridge, MD;  Location: [facility];  Service: Orthopedics;  Laterality: Right;  . TRANSURETHRAL RESECTION OF PROSTATE      Family History  Problem Relation Age of Onset  . Bladder Cancer Brother   . Heart disease Father   . Kidney disease Neg Hx   . Prostate cancer Neg Hx    No Known Allergies     Assessment & Plan:  Patient presents self-referred for evaluation of painful varicose veins. Patient states the discomfort associated with his varicosities have worsened over the last few months. The patient has seen a decline in his activity level due to this discomfort. The patient states his left leg is more symptomatic than his right. His discomfort has progressively worsened which has caused him to seek medical evaluation. The patient also notes cramping to the buttocks and thigh of his left leg with activity.His discomfort has progress to the point he is unable to function on a daily basis. Patient denies any trauma or surgery to the lower extremity. Patient denies any DVT history. Patient denies any fever, nausea or vomiting.  1. Varicose veins of both lower extremities with pain - New Patient with progressively worsening varicosities to the lower  extremity. This has interfered with his ability to function on a daily basis The patient has as noted a decline in his activity level due to his discomfort. Patient has been wearing medical grade one compression and engaging and elevation however this is provided minimal relief in his symptoms. I will order a venous duplex to rule out any contributing venous disease. The patient is to engage in conservative therapy until his ultrasound.  - VAS [place] LOWER EXTREMITY VENOUS REFLUX; Future  2. Lower extremity pain, bilateral - New Patient with cramping to the buttocks and thighs especially in the left leg with activity. The patient has multiple risk factors for peripheral artery disease Unable to palpate pedal pulses on exam I will order an  ABI to rule out out any contributing peripheral artery disease.  - VAS [place] ABI WITH/WO TBI; Future  3. Benign essential HTN - Stable Encouraged good control as its slows the progression of atherosclerotic disease  4. Controlled type 2 diabetes mellitus without complication, without long-term current use of insulin (HCC) - Stable Encouraged good control as its slows the progression of atherosclerotic disease  5. Hyperlipidemia, unspecified hyperlipidemia type - Stable Encouraged good control as its slows the progression of atherosclerotic disease  Current Outpatient Prescriptions on File Prior to Visit  Medication Sig Dispense Refill  . Ascorbic Acid (VITAMIN C) 1000 MG tablet Take 2,000 mg by mouth daily.    Marland Kitchen aspirin EC 81 MG tablet Take 81 mg by mouth daily.    Marland Kitchen azelastine (ASTELIN) 0.1 % nasal spray Place into both nostrils 2 (two) times daily. Use in each nostril as directed    . buPROPion (WELLBUTRIN XL) 300 MG 24 hr tablet TAKE 1 TABLET DAILY 90 tablet 3  . busPIRone (BUSPAR) 15 MG tablet Take 1 tablet (15 mg total) by mouth 2 (two) times daily. 180 tablet 0  . Cholecalciferol (VITAMIN D3) 5000 units CAPS Take 1 capsule by mouth daily.    .  clonazePAM (KLONOPIN) 0.5 MG tablet Take 1 tablet (0.5 mg total) by mouth 2 (two) times daily as needed. anxiety 60 tablet 0  . Coenzyme Q10 (CO Q 10) 100 MG CAPS Take 1 capsule by mouth daily.    Marland Kitchen ezetimibe-simvastatin (VYTORIN) 10-20 MG tablet TAKE 1 TABLET DAILY AT 6 P.M 90 tablet 3  . fish oil-omega-3 fatty acids 1000 MG capsule Take 2 g by mouth daily.    . irbesartan (AVAPRO) 150 MG tablet TAKE 1 TABLET DAILY 90 tablet 0  . metFORMIN (GLUCOPHAGE) 1000 MG tablet Take 1 tablet (1,000 mg total) by mouth 2 (two) times daily with a meal. 180 tablet 0  . Multiple Vitamin (MULTIVITAMIN WITH MINERALS) TABS Take 1 tablet by mouth daily.    . Multiple Vitamins-Calcium (CALCI-MAX PO) Take 1 tablet by mouth daily. Reported on 06/02/2015    . pantoprazole (PROTONIX) 40 MG tablet Take 1 tablet (40 mg total) by mouth daily. 90 tablet 0  . Pumpkin Seed-Soy Germ (AZO BLADDER CONTROL/GO-LESS PO) Take 2 capsules by mouth 3 (three) times daily.    . sildenafil (VIAGRA) 50 MG tablet Take 1 tablet (50 mg total) by mouth daily as needed for erectile dysfunction. 1 tablet po 30 mins prior to sexual activity 10 tablet 0  . solifenacin (VESICARE) 10 MG tablet Take 1 tablet (10 mg total) by mouth daily. 90 tablet 3  . Suvorexant (BELSOMRA) 10 MG TABS Take 1 tablet by mouth at bedtime as needed. 90 tablet 0  . fluticasone (FLONASE) 50 MCG/ACT nasal spray Place 2 sprays into both nostrils daily. (Patient not taking: Reported on 02/20/2017) 16 g 1   No current facility-administered medications on file prior to visit.    There are no Patient Instructions on file for this visit. No Follow-up on file.  Fiorela Pelzer A Reighlyn Elmes, PA-C

## 2017-02-22 DIAGNOSIS — L84 Corns and callosities: Secondary | ICD-10-CM | POA: Diagnosis not present

## 2017-02-22 DIAGNOSIS — E119 Type 2 diabetes mellitus without complications: Secondary | ICD-10-CM | POA: Diagnosis not present

## 2017-03-14 ENCOUNTER — Ambulatory Visit: Payer: TRICARE For Life (TFL) | Admitting: Orthotics

## 2017-03-14 DIAGNOSIS — E1121 Type 2 diabetes mellitus with diabetic nephropathy: Secondary | ICD-10-CM

## 2017-03-14 DIAGNOSIS — I10 Essential (primary) hypertension: Secondary | ICD-10-CM

## 2017-03-14 DIAGNOSIS — S81801A Unspecified open wound, right lower leg, initial encounter: Secondary | ICD-10-CM

## 2017-03-14 DIAGNOSIS — L089 Local infection of the skin and subcutaneous tissue, unspecified: Secondary | ICD-10-CM

## 2017-03-19 NOTE — Progress Notes (Signed)
Patient came in today to pick up diabetic shoes; however he wasn't happy with fit or style.  Angie to reorder shoes [employer] size 14W.

## 2017-03-22 ENCOUNTER — Ambulatory Visit
Admission: RE | Admit: 2017-03-22 | Discharge: 2017-03-22 | Disposition: A | Discharge status: Home / Self Care | Payer: Medicare Other | Source: Ambulatory Visit | Attending: Internal Medicine | Admitting: Internal Medicine

## 2017-03-22 ENCOUNTER — Other Ambulatory Visit: Payer: Self-pay | Admitting: Internal Medicine

## 2017-03-22 DIAGNOSIS — E782 Mixed hyperlipidemia: Secondary | ICD-10-CM | POA: Diagnosis not present

## 2017-03-22 DIAGNOSIS — M7989 Other specified soft tissue disorders: Secondary | ICD-10-CM | POA: Insufficient documentation

## 2017-03-22 DIAGNOSIS — I70219 Atherosclerosis of native arteries of extremities with intermittent claudication, unspecified extremity: Secondary | ICD-10-CM | POA: Diagnosis not present

## 2017-03-22 DIAGNOSIS — I824Y9 Acute embolism and thrombosis of unspecified deep veins of unspecified proximal lower extremity: Secondary | ICD-10-CM | POA: Diagnosis not present

## 2017-03-22 DIAGNOSIS — I10 Essential (primary) hypertension: Secondary | ICD-10-CM | POA: Diagnosis not present

## 2017-03-22 DIAGNOSIS — M79662 Pain in left lower leg: Secondary | ICD-10-CM

## 2017-03-22 DIAGNOSIS — I25810 Atherosclerosis of coronary artery bypass graft(s) without angina pectoris: Secondary | ICD-10-CM | POA: Diagnosis not present

## 2017-03-22 DIAGNOSIS — I824Y1 Acute embolism and thrombosis of unspecified deep veins of right proximal lower extremity: Secondary | ICD-10-CM

## 2017-03-27 ENCOUNTER — Ambulatory Visit: Payer: Medicare Other | Admitting: Orthotics

## 2017-03-27 DIAGNOSIS — E1121 Type 2 diabetes mellitus with diabetic nephropathy: Secondary | ICD-10-CM

## 2017-03-27 NOTE — Progress Notes (Signed)
Patient came to pick up shoes, werent in.  Will be called when comes in.

## 2017-03-28 DIAGNOSIS — M48061 Spinal stenosis, lumbar region without neurogenic claudication: Secondary | ICD-10-CM | POA: Diagnosis not present

## 2017-03-28 DIAGNOSIS — M1612 Unilateral primary osteoarthritis, left hip: Secondary | ICD-10-CM | POA: Diagnosis not present

## 2017-03-29 ENCOUNTER — Ambulatory Visit (INDEPENDENT_AMBULATORY_CARE_PROVIDER_SITE_OTHER): Payer: Medicare Other

## 2017-03-29 ENCOUNTER — Encounter (INDEPENDENT_AMBULATORY_CARE_PROVIDER_SITE_OTHER): Payer: Self-pay | Admitting: Vascular Surgery

## 2017-03-29 ENCOUNTER — Ambulatory Visit (INDEPENDENT_AMBULATORY_CARE_PROVIDER_SITE_OTHER): Payer: Medicare Other | Admitting: Vascular Surgery

## 2017-03-29 VITALS — BP 169/88 | HR 71 | Resp 16 | Ht 74.5 in | Wt 274.0 lb

## 2017-03-29 DIAGNOSIS — I890 Lymphedema, not elsewhere classified: Secondary | ICD-10-CM | POA: Diagnosis not present

## 2017-03-29 DIAGNOSIS — E1121 Type 2 diabetes mellitus with diabetic nephropathy: Secondary | ICD-10-CM

## 2017-03-29 DIAGNOSIS — E782 Mixed hyperlipidemia: Secondary | ICD-10-CM | POA: Diagnosis not present

## 2017-03-29 DIAGNOSIS — M79604 Pain in right leg: Secondary | ICD-10-CM

## 2017-03-29 DIAGNOSIS — I83813 Varicose veins of bilateral lower extremities with pain: Secondary | ICD-10-CM

## 2017-03-29 DIAGNOSIS — M79605 Pain in left leg: Secondary | ICD-10-CM

## 2017-03-30 ENCOUNTER — Encounter (INDEPENDENT_AMBULATORY_CARE_PROVIDER_SITE_OTHER): Payer: Self-pay | Admitting: Vascular Surgery

## 2017-03-30 DIAGNOSIS — I890 Lymphedema, not elsewhere classified: Secondary | ICD-10-CM | POA: Insufficient documentation

## 2017-03-30 NOTE — Progress Notes (Signed)
Subjective:    Patient ID: Seth Mulberry Sr., male    DOB: August 11, 1940, 76 y.o.   MRN: [id number] Chief Complaint  Patient presents with  . Follow-up    pt conv abi,bil ven reflux   Patient presents to review vascular studies.  He was last seen on February 20, 2017 for evaluation of lower extremity swelling and discomfort.  His initial visit, the patient has been engaging in conservative therapy including wearing medical grade 1 compression stockings, elevating his legs and remaining active with minimal improvement in symptoms requiring over the counter use of anti-inflammatories with minimal relief.  The patient's symptoms have progressed to the point he is unable to function on a daily basis.  They have become lifestyle limiting.  The patient underwent a bilateral ABI which was notable for no significant lower extremity arterial disease.  Right triphasic tibials.  Left triphasic posterior tibial with a biphasic anterior tibial.  The patient underwent a bilateral lower extremity venous reflux exam which was notable for venous incompetence in the left common femoral vein, saphenous femoral junction, femoral vein and popliteal veins of the bilateral lower extremity.  No evidence of deep or superficial vein thrombosis in the bilateral lower extremities.  The patient denies any rest pain or ulceration to the lower extremity.  The patient denies any fever, nausea or vomiting.   Review of Systems  Constitutional: Negative.   HENT: Negative.   Eyes: Negative.   Respiratory: Negative.   Cardiovascular: Positive for leg swelling.       Painful varicose veins  Gastrointestinal: Negative.   Endocrine: Negative.   Genitourinary: Negative.   Musculoskeletal: Negative.   Skin: Negative.   Allergic/Immunologic: Negative.   Neurological: Negative.   Hematological: Negative.   Psychiatric/Behavioral: Negative.       Objective:   Physical Exam  Constitutional: He is oriented to person, place, and  time. He appears well-developed and well-nourished.  HENT:  Head: Normocephalic and atraumatic.  Eyes: Conjunctivae are normal. Pupils are equal, round, and reactive to light.  Neck: Normal range of motion.  Cardiovascular: Normal rate, regular rhythm, normal heart sounds and intact distal pulses.  Pulses:      Radial pulses are 2+ on the right side, and 2+ on the left side.  Hard to palpate pedal pulses however his bilateral feet are warm  Pulmonary/Chest: Effort normal and breath sounds normal.  Musculoskeletal: Normal range of motion. He exhibits edema (Mild bilateral nonpitting lower extremity edema noted).  Neurological: He is alert and oriented to person, place, and time.  Skin:  Less than 1 cm scattered varicosities to the lower extremity.  There is no cellulitis.  There is no stasis dermatitis.  Psychiatric: He has a normal mood and affect. His behavior is normal. Judgment and thought content normal.  Vitals reviewed.  BP (!) 169/88 (BP Location: Left Arm)   Pulse 71   Resp 16   Ht 6' 2.5" (1.892 m)   Wt 274 lb (124.3 kg)   BMI 34.71 kg/m   Past Medical History:  Diagnosis Date  . Arthritis    rheumatoid   . Coronary artery disease   . Depression   . Diabetes mellitus without complication ([place])   . Diverticulosis   . GERD (gastroesophageal reflux disease)   . Hypertension   . Leg lesion    RIGHT WOUND CARE  . Localized osteoarthritis of right knee 05/21/2012  . Reflux   . Sleep apnea    CPAP  . Sleeping  difficulty    takes med nightly  . Urgency of urination    Social History   Socioeconomic History  . Marital status: Widowed    Spouse name: Not on file  . Number of children: Not on file  . Years of education: Not on file  . Highest education level: Not on file  Social Needs  . Financial resource strain: Not on file  . Food insecurity - worry: Not on file  . Food insecurity - inability: Not on file  . Transportation needs - medical: Not on file  .  Transportation needs - non-medical: Not on file  Occupational History  . Not on file  Tobacco Use  . Smoking status: Former Smoker    Last attempt to quit: 05/14/1992    Years since quitting: 24.8  . Smokeless tobacco: Never Used  Substance and Sexual Activity  . Alcohol use: Yes    Alcohol/week: 0.6 oz    Types: 1 Glasses of wine per week  . Drug use: No  . Sexual activity: Not on file  Other Topics Concern  . Not on file  Social History Narrative  . Not on file   Past Surgical History:  Procedure Laterality Date  . BACK SURGERY    . CATARACT EXTRACTION W/PHACO Right 12/16/2015   Procedure: CATARACT EXTRACTION PHACO AND INTRAOCULAR LENS PLACEMENT (IOC);  Surgeon: Eulogio Bear, MD;  Location: [facility] ORS;  Service: Ophthalmology;  Laterality: Right;  [place] 01:16 AP% 8.7 CDE 6.66 fluid pack lot # [id number]  . CATARACT EXTRACTION W/PHACO Left 02/03/2016   Procedure: CATARACT EXTRACTION PHACO AND INTRAOCULAR LENS PLACEMENT ([place]);  Surgeon: Eulogio Bear, MD;  Location: [facility] ORS;  Service: Ophthalmology;  Laterality: Left;  Lot# [id number] H [place]: 00:52.3 AP%: 13.8 CDE: 7.21  . CORONARY ANGIOPLASTY     STENT  . CORONARY ARTERY BYPASS GRAFT  2004   x4 vessels  . FOOT SURGERY     x2  . FOOT SURGERY     x2  . JOINT REPLACEMENT    . TOTAL KNEE ARTHROPLASTY  05/21/2012   Procedure: TOTAL KNEE ARTHROPLASTY;  Surgeon: Johnny Bridge, MD;  Location: [facility];  Service: Orthopedics;  Laterality: Right;  . TRANSURETHRAL RESECTION OF PROSTATE     Family History  Problem Relation Age of Onset  . Bladder Cancer Brother   . Heart disease Father   . Kidney disease Neg Hx   . Prostate cancer Neg Hx    No Known Allergies     Assessment & Plan:  Patient presents to review vascular studies.  He was last seen on February 20, 2017 for evaluation of lower extremity swelling and discomfort.  His initial visit, the patient has been engaging in conservative therapy including wearing medical grade 1  compression stockings, elevating his legs and remaining active with minimal improvement in symptoms requiring over the counter use of anti-inflammatories with minimal relief.  The patient's symptoms have progressed to the point he is unable to function on a daily basis.  They have become lifestyle limiting.  The patient underwent a bilateral ABI which was notable for no significant lower extremity arterial disease.  Right triphasic tibials.  Left triphasic posterior tibial with a biphasic anterior tibial.  The patient underwent a bilateral lower extremity venous reflux exam which was notable for venous incompetence in the left common femoral vein, saphenous femoral junction, femoral vein and popliteal veins of the bilateral lower extremity.  No evidence of deep or superficial vein thrombosis in  the bilateral lower extremities.  The patient denies any rest pain or ulceration to the lower extremity.  The patient denies any fever, nausea or vomiting.  1. Lymphedema - New Despite conservative treatments including exercise, elevation and class I compression stockings the patient still presents with stage I lymphedema. The patient would greatly benefit from the added therapy of a lymphedema pump. We have discussed the appropriate use of the pump as at least 1-2 times a day for an hour each time. The patient is to continue engaging in conservative therapy including wearing his medical grade 1 compression stockings, elevating his legs and remaining active. Patient is to follow-up in 3 months so I can assess his progress with a lymphedema pump.  2. Controlled type 2 diabetes mellitus with diabetic nephropathy, unspecified whether long term insulin use ([place]) - Stable Encouraged good control as its slows the progression of atherosclerotic disease  3. Combined fat and carbohydrate induced hyperlipemia - Stable Encouraged good control as its slows the progression of atherosclerotic disease  Current Outpatient  Medications on File Prior to Visit  Medication Sig Dispense Refill  . Ascorbic Acid (VITAMIN C) 1000 MG tablet Take 2,000 mg by mouth daily.    Marland Kitchen aspirin EC 81 MG tablet Take 81 mg by mouth daily.    Marland Kitchen azelastine (ASTELIN) 0.1 % nasal spray Place into both nostrils 2 (two) times daily. Use in each nostril as directed    . buPROPion (WELLBUTRIN XL) 300 MG 24 hr tablet TAKE 1 TABLET DAILY 90 tablet 3  . busPIRone (BUSPAR) 15 MG tablet Take 1 tablet (15 mg total) by mouth 2 (two) times daily. 180 tablet 0  . Cholecalciferol (VITAMIN D3) 5000 units CAPS Take 1 capsule by mouth daily.    . clonazePAM (KLONOPIN) 0.5 MG tablet Take 1 tablet (0.5 mg total) by mouth 2 (two) times daily as needed. anxiety 60 tablet 0  . Coenzyme Q10 (CO Q 10) 100 MG CAPS Take 1 capsule by mouth daily.    Marland Kitchen ezetimibe-simvastatin (VYTORIN) 10-20 MG tablet TAKE 1 TABLET DAILY AT 6 P.M 90 tablet 3  . fish oil-omega-3 fatty acids 1000 MG capsule Take 2 g by mouth daily.    . fluticasone (FLONASE) 50 MCG/ACT nasal spray Place 2 sprays into both nostrils daily. 16 g 1  . irbesartan (AVAPRO) 150 MG tablet TAKE 1 TABLET DAILY 90 tablet 0  . metFORMIN (GLUCOPHAGE) 1000 MG tablet Take 1 tablet (1,000 mg total) by mouth 2 (two) times daily with a meal. 180 tablet 0  . Multiple Vitamin (MULTIVITAMIN WITH MINERALS) TABS Take 1 tablet by mouth daily.    . Multiple Vitamins-Calcium (CALCI-MAX PO) Take 1 tablet by mouth daily. Reported on 06/02/2015    . pantoprazole (PROTONIX) 40 MG tablet Take 1 tablet (40 mg total) by mouth daily. 90 tablet 0  . Pumpkin Seed-Soy Germ (AZO BLADDER CONTROL/GO-LESS PO) Take 2 capsules by mouth 3 (three) times daily.    . sildenafil (VIAGRA) 50 MG tablet Take 1 tablet (50 mg total) by mouth daily as needed for erectile dysfunction. 1 tablet po 30 mins prior to sexual activity 10 tablet 0  . solifenacin (VESICARE) 10 MG tablet Take 1 tablet (10 mg total) by mouth daily. 90 tablet 3  . Suvorexant (BELSOMRA) 10  MG TABS Take 1 tablet by mouth at bedtime as needed. 90 tablet 0  . traMADol (ULTRAM) 50 MG tablet One to Two Tabs Every Six Hours As Needed For Pain 60 tablet 0  No current facility-administered medications on file prior to visit.    There are no Patient Instructions on file for this visit. No Follow-up on file.  Bruchy Mikel A Dedric Ethington, PA-C

## 2017-04-06 ENCOUNTER — Ambulatory Visit (INDEPENDENT_AMBULATORY_CARE_PROVIDER_SITE_OTHER): Payer: Medicare Other | Admitting: Podiatry

## 2017-04-06 DIAGNOSIS — E1121 Type 2 diabetes mellitus with diabetic nephropathy: Secondary | ICD-10-CM

## 2017-04-06 NOTE — Patient Instructions (Signed)

## 2017-04-06 NOTE — Progress Notes (Signed)
Patient presents for diabetic shoe pick up, shoes are tried on for good fit.  Patient received 1 Pair and 3 pairs custom molded diabetic inserts.  Verbal and written break in and wear instructions given.  Patient will follow up for scheduled routine care.   

## 2017-04-13 DIAGNOSIS — M1612 Unilateral primary osteoarthritis, left hip: Secondary | ICD-10-CM | POA: Diagnosis not present

## 2017-04-13 DIAGNOSIS — M25552 Pain in left hip: Secondary | ICD-10-CM | POA: Diagnosis not present

## 2017-04-26 ENCOUNTER — Other Ambulatory Visit: Payer: Self-pay | Admitting: Family Medicine

## 2017-05-02 DIAGNOSIS — M1612 Unilateral primary osteoarthritis, left hip: Secondary | ICD-10-CM | POA: Diagnosis not present

## 2017-05-14 DIAGNOSIS — Z79899 Other long term (current) drug therapy: Secondary | ICD-10-CM | POA: Diagnosis not present

## 2017-05-14 DIAGNOSIS — E119 Type 2 diabetes mellitus without complications: Secondary | ICD-10-CM | POA: Diagnosis not present

## 2017-05-14 DIAGNOSIS — Z125 Encounter for screening for malignant neoplasm of prostate: Secondary | ICD-10-CM | POA: Diagnosis not present

## 2017-05-14 DIAGNOSIS — E7800 Pure hypercholesterolemia, unspecified: Secondary | ICD-10-CM | POA: Diagnosis not present

## 2017-05-21 DIAGNOSIS — E7800 Pure hypercholesterolemia, unspecified: Secondary | ICD-10-CM | POA: Diagnosis not present

## 2017-05-21 DIAGNOSIS — I10 Essential (primary) hypertension: Secondary | ICD-10-CM | POA: Diagnosis not present

## 2017-05-21 DIAGNOSIS — Z79899 Other long term (current) drug therapy: Secondary | ICD-10-CM | POA: Diagnosis not present

## 2017-05-21 DIAGNOSIS — Z0000 Encounter for general adult medical examination without abnormal findings: Secondary | ICD-10-CM | POA: Diagnosis not present

## 2017-05-21 DIAGNOSIS — F411 Generalized anxiety disorder: Secondary | ICD-10-CM | POA: Diagnosis not present

## 2017-05-21 DIAGNOSIS — Z1211 Encounter for screening for malignant neoplasm of colon: Secondary | ICD-10-CM | POA: Diagnosis not present

## 2017-05-21 DIAGNOSIS — E1151 Type 2 diabetes mellitus with diabetic peripheral angiopathy without gangrene: Secondary | ICD-10-CM | POA: Diagnosis not present

## 2017-06-07 DIAGNOSIS — Z1211 Encounter for screening for malignant neoplasm of colon: Secondary | ICD-10-CM | POA: Diagnosis not present

## 2017-06-27 ENCOUNTER — Encounter (INDEPENDENT_AMBULATORY_CARE_PROVIDER_SITE_OTHER): Payer: Self-pay | Admitting: Vascular Surgery

## 2017-06-27 ENCOUNTER — Ambulatory Visit (INDEPENDENT_AMBULATORY_CARE_PROVIDER_SITE_OTHER): Payer: Medicare Other | Admitting: Vascular Surgery

## 2017-06-27 VITALS — BP 129/79 | HR 66 | Resp 17 | Ht 74.5 in | Wt 272.0 lb

## 2017-06-27 DIAGNOSIS — I890 Lymphedema, not elsewhere classified: Secondary | ICD-10-CM

## 2017-06-27 DIAGNOSIS — I83813 Varicose veins of bilateral lower extremities with pain: Secondary | ICD-10-CM

## 2017-06-27 MED ORDER — TRAMADOL HCL 50 MG PO TABS: ORAL_TABLET | ORAL | 0 refills | Status: DC

## 2017-06-27 NOTE — Progress Notes (Signed)
Subjective:    Patient ID: Seth Mulberry Sr., male    DOB: 1940/10/05, 77 y.o.   MRN: [id number] Chief Complaint  Patient presents with  . Follow-up    3 month f/u   Patient presents for a 31-month lymphedema follow-up.  During her last visit, the patient had failed conservative therapy and a lymphedema pump was ordered.  Since then, the patient has received his pump.  The patient has been engaging in conservative therapy including wearing medical grade 1 compression stockings, elevating his legs and using his pump 1-2 times a day for an hour each time.  The patient has noticed an improvement in his bilateral lower extremity edema and the discomfort he was experiencing along the varicosities in his legs.  The patient is pleased with his results.  The patient denies any claudication-like symptoms, rest pain or ulceration to the bilateral lower extremity.  The patient denies any fever, nausea or vomiting.   Review of Systems  Constitutional: Negative.   HENT: Negative.   Eyes: Negative.   Respiratory: Negative.   Cardiovascular:       Lymphedema Varicose veins  Gastrointestinal: Negative.   Endocrine: Negative.   Genitourinary: Negative.   Musculoskeletal: Negative.   Skin: Negative.   Allergic/Immunologic: Negative.   Neurological: Negative.   Hematological: Negative.   Psychiatric/Behavioral: Negative.       Objective:   Physical Exam  Constitutional: He is oriented to person, place, and time. He appears well-developed and well-nourished. No distress.  HENT:  Head: Normocephalic and atraumatic.  Eyes: Conjunctivae are normal. Pupils are equal, round, and reactive to light.  Neck: Normal range of motion.  Cardiovascular: Normal rate, regular rhythm, normal heart sounds and intact distal pulses.  Pulses:      Radial pulses are 2+ on the right side, and 2+ on the left side.  Hard to palpate pedal pulses however the bilateral feet are warm  Pulmonary/Chest: Effort normal and  breath sounds normal.  Musculoskeletal: Normal range of motion. He exhibits no edema (Minimal to no bilateral extremity edema noted).  Neurological: He is alert and oriented to person, place, and time.  Skin: Skin is warm and dry. He is not diaphoretic.  Less than 1 cm scattered varicosities to the lower extremity.  There is no cellulitis.  There is no stasis dermatitis.  Psychiatric: He has a normal mood and affect. His behavior is normal. Judgment and thought content normal.  Vitals reviewed.  BP 129/79 (BP Location: Right Arm, Patient Position: Sitting)   Pulse 66   Resp 17   Ht 6' 2.5" (1.892 m)   Wt 272 lb (123.4 kg)   BMI 34.46 kg/m   Past Medical History:  Diagnosis Date  . Arthritis    rheumatoid   . Coronary artery disease   . Depression   . Diabetes mellitus without complication ([place])   . Diverticulosis   . GERD (gastroesophageal reflux disease)   . Hypertension   . Leg lesion    RIGHT WOUND CARE  . Localized osteoarthritis of right knee 05/21/2012  . Reflux   . Sleep apnea    CPAP  . Sleeping difficulty    takes med nightly  . Urgency of urination    Social History   Socioeconomic History  . Marital status: Widowed    Spouse name: Not on file  . Number of children: Not on file  . Years of education: Not on file  . Highest education level: Not on file  Social Needs  . Financial resource strain: Not on file  . Food insecurity - worry: Not on file  . Food insecurity - inability: Not on file  . Transportation needs - medical: Not on file  . Transportation needs - non-medical: Not on file  Occupational History  . Not on file  Tobacco Use  . Smoking status: Former Smoker    Last attempt to quit: 05/14/1992    Years since quitting: 25.1  . Smokeless tobacco: Never Used  Substance and Sexual Activity  . Alcohol use: Yes    Alcohol/week: 0.6 oz    Types: 1 Glasses of wine per week  . Drug use: No  . Sexual activity: Not on file  Other Topics Concern  .  Not on file  Social History Narrative  . Not on file   Past Surgical History:  Procedure Laterality Date  . BACK SURGERY    . CATARACT EXTRACTION W/PHACO Right 12/16/2015   Procedure: CATARACT EXTRACTION PHACO AND INTRAOCULAR LENS PLACEMENT (IOC);  Surgeon: Eulogio Bear, MD;  Location: [facility];  Service: Ophthalmology;  Laterality: Right;  [place] 01:16 AP% 8.7 CDE 6.66 fluid pack lot # [id number]  . CATARACT EXTRACTION W/PHACO Left 02/03/2016   Procedure: CATARACT EXTRACTION PHACO AND INTRAOCULAR LENS PLACEMENT ([place]);  Surgeon: Eulogio Bear, MD;  Location: [facility];  Service: Ophthalmology;  Laterality: Left;  Lot# [id number] H [place]: 00:52.3 AP%: 13.8 CDE: 7.21  . CORONARY ANGIOPLASTY     STENT  . CORONARY ARTERY BYPASS GRAFT  2004   x4 vessels  . FOOT SURGERY     x2  . FOOT SURGERY     x2  . JOINT REPLACEMENT    . TOTAL KNEE ARTHROPLASTY  05/21/2012   Procedure: TOTAL KNEE ARTHROPLASTY;  Surgeon: Johnny Bridge, MD;  Location: [facility];  Service: Orthopedics;  Laterality: Right;  . TRANSURETHRAL RESECTION OF PROSTATE     Family History  Problem Relation Age of Onset  . Bladder Cancer Brother   . Heart disease Father   . Kidney disease Neg Hx   . Prostate cancer Neg Hx    No Known Allergies     Assessment & Plan:  Patient presents for a 69-month lymphedema follow-up.  During her last visit, the patient had failed conservative therapy and a lymphedema pump was ordered.  Since then, the patient has received his pump.  The patient has been engaging in conservative therapy including wearing medical grade 1 compression stockings, elevating his legs and using his pump 1-2 times a day for an hour each time.  The patient has noticed an improvement in his bilateral lower extremity edema and the discomfort he was experiencing along the varicosities in his legs.  The patient is pleased with his results.  The patient denies any claudication-like symptoms, rest pain or ulceration to the  bilateral lower extremity.  The patient denies any fever, nausea or vomiting.  1. Varicose veins of both lower extremities with pain - Stable As below  2. Lymphedema - Stable The patient continues to engage in conservative therapy including wearing medical grade 1 compression socks and elevation of his legs on a daily basis The patient has received his lymphedema pump and uses it 1-2 times a day for an hour each time The patient has noticed an improvement in his bilateral lower extremity edema and the discomfort he has been experiencing to his varicosities to the legs But the patient is pleased with the improvement he has experienced The patient  is to continue conservative therapy with the added benefit of a lymphedema pump Patient to follow-up as needed  Current Outpatient Medications on File Prior to Visit  Medication Sig Dispense Refill  . Ascorbic Acid (VITAMIN C) 1000 MG tablet Take 2,000 mg by mouth daily.    Marland Kitchen aspirin EC 81 MG tablet Take 81 mg by mouth daily.    Marland Kitchen azelastine (ASTELIN) 0.1 % nasal spray Place into both nostrils 2 (two) times daily. Use in each nostril as directed    . buPROPion (WELLBUTRIN XL) 300 MG 24 hr tablet TAKE 1 TABLET DAILY 90 tablet 3  . busPIRone (BUSPAR) 15 MG tablet Take 1 tablet (15 mg total) by mouth 2 (two) times daily. 180 tablet 0  . Cholecalciferol (VITAMIN D3) 5000 units CAPS Take 1 capsule by mouth daily.    . clonazePAM (KLONOPIN) 0.5 MG tablet Take 1 tablet (0.5 mg total) by mouth 2 (two) times daily as needed. anxiety 60 tablet 0  . Coenzyme Q10 (CO Q 10) 100 MG CAPS Take 1 capsule by mouth daily.    Marland Kitchen ezetimibe-simvastatin (VYTORIN) 10-20 MG tablet TAKE 1 TABLET DAILY AT 6 P.M 90 tablet 3  . fish oil-omega-3 fatty acids 1000 MG capsule Take 2 g by mouth daily.    . fluticasone (FLONASE) 50 MCG/ACT nasal spray Place 2 sprays into both nostrils daily. 16 g 1  . Folic Acid-Cholecalciferol 1-500 MG-UNIT TABS Take by mouth.    . irbesartan  (AVAPRO) 150 MG tablet TAKE 1 TABLET DAILY 90 tablet 0  . metFORMIN (GLUCOPHAGE) 1000 MG tablet Take 1 tablet (1,000 mg total) by mouth 2 (two) times daily with a meal. 180 tablet 0  . Multiple Vitamin (MULTIVITAMIN WITH MINERALS) TABS Take 1 tablet by mouth daily.    . pantoprazole (PROTONIX) 40 MG tablet Take 1 tablet (40 mg total) by mouth daily. 90 tablet 0  . Pumpkin Seed-Soy Germ (AZO BLADDER CONTROL/GO-LESS PO) Take 2 capsules by mouth 3 (three) times daily.    . sildenafil (VIAGRA) 50 MG tablet Take 1 tablet (50 mg total) by mouth daily as needed for erectile dysfunction. 1 tablet po 30 mins prior to sexual activity 10 tablet 0  . solifenacin (VESICARE) 10 MG tablet Take 1 tablet (10 mg total) by mouth daily. 90 tablet 3  . Suvorexant (BELSOMRA) 10 MG TABS Take 1 tablet by mouth at bedtime as needed. 90 tablet 0  . tamsulosin (FLOMAX) 0.4 MG CAPS capsule Take by mouth.    . Turmeric Curcumin 500 MG CAPS Take by mouth.    . Vitamins/Minerals TABS Take by mouth.     No current facility-administered medications on file prior to visit.    There are no Patient Instructions on file for this visit. No Follow-up on file.  Yachet Mattson A Hiroyuki Ozanich, PA-C

## 2017-07-18 DIAGNOSIS — I25708 Atherosclerosis of coronary artery bypass graft(s), unspecified, with other forms of angina pectoris: Secondary | ICD-10-CM | POA: Diagnosis not present

## 2017-07-18 DIAGNOSIS — E782 Mixed hyperlipidemia: Secondary | ICD-10-CM | POA: Diagnosis not present

## 2017-07-18 DIAGNOSIS — I10 Essential (primary) hypertension: Secondary | ICD-10-CM | POA: Diagnosis not present

## 2017-07-18 DIAGNOSIS — G4733 Obstructive sleep apnea (adult) (pediatric): Secondary | ICD-10-CM | POA: Diagnosis not present

## 2017-07-23 DIAGNOSIS — M1612 Unilateral primary osteoarthritis, left hip: Secondary | ICD-10-CM | POA: Diagnosis not present

## 2017-07-31 DIAGNOSIS — I25810 Atherosclerosis of coronary artery bypass graft(s) without angina pectoris: Secondary | ICD-10-CM | POA: Diagnosis not present

## 2017-07-31 DIAGNOSIS — I70219 Atherosclerosis of native arteries of extremities with intermittent claudication, unspecified extremity: Secondary | ICD-10-CM | POA: Diagnosis not present

## 2017-07-31 DIAGNOSIS — I10 Essential (primary) hypertension: Secondary | ICD-10-CM | POA: Diagnosis not present

## 2017-07-31 DIAGNOSIS — E782 Mixed hyperlipidemia: Secondary | ICD-10-CM | POA: Diagnosis not present

## 2017-07-31 DIAGNOSIS — I25708 Atherosclerosis of coronary artery bypass graft(s), unspecified, with other forms of angina pectoris: Secondary | ICD-10-CM | POA: Diagnosis not present

## 2017-09-04 ENCOUNTER — Other Ambulatory Visit: Payer: Self-pay | Admitting: Orthopedic Surgery

## 2017-09-05 NOTE — Pre-Procedure Instructions (Signed)
Seth Mulberry Sr.  09/05/2017      [facility] [address], [place] AT [place] [address] Phone: [phone] Fax: [phone]    Your procedure is scheduled on Tuesday May 28.  Report to [facility] Admitting at 8:30 A.M.  Call this number if you have problems the morning of surgery:  [phone]   Remember:  Do not eat food or drink liquids after midnight.  Take these medicines the morning of surgery with A SIP OF WATER:   Bupropion (wellbutrin) Buspar (Buspirone) Pantoprazole (protonix) Solifenacin (Vesicare) Tamsulosin (flomax) Flonase  DO NOT TAKE metformin (Glucophage) the day of surgery  7 days prior to surgery STOP taking anyAleve, Naproxen, Ibuprofen, Motrin, Advil, Goody's, BC's, all herbal medications, fish oil, and all vitamins  FOLLOW your surgeon's instructions on stopping Aspirin.      How to Manage Your Diabetes Before and After Surgery  Why is it important to control my blood sugar before and after surgery? . Improving blood sugar levels before and after surgery helps healing and can limit problems. . A way of improving blood sugar control is eating a healthy diet by: o  Eating less sugar and carbohydrates o  Increasing activity/exercise o  Talking with your doctor about reaching your blood sugar goals . High blood sugars (greater than 180 mg/dL) can raise your risk of infections and slow your recovery, so you will need to focus on controlling your diabetes during the weeks before surgery. . Make sure that the doctor who takes care of your diabetes knows about your planned surgery including the date and location.  How do I manage my blood sugar before surgery? . Check your blood sugar at least 4 times a day, starting 2 days before surgery, to make sure that the level is not too high or low. o Check your blood sugar the morning of your surgery when  you wake up and every 2 hours until you get to the Short Stay unit. . If your blood sugar is less than 70 mg/dL, you will need to treat for low blood sugar: o Do not take insulin. o Treat a low blood sugar (less than 70 mg/dL) with  cup of clear juice (cranberry or apple), 4 glucose tablets, OR glucose gel. Recheck blood sugar in 15 minutes after treatment (to make sure it is greater than 70 mg/dL). If your blood sugar is not greater than 70 mg/dL on recheck, call [phone] o  for further instructions. . Report your blood sugar to the short stay nurse when you get to Short Stay.  . If you are admitted to the hospital after surgery: o Your blood sugar will be checked by the staff and you will probably be given insulin after surgery (instead of oral diabetes medicines) to make sure you have good blood sugar levels. o The goal for blood sugar control after surgery is 80-180 mg/dL.              Do not wear jewelry, make-up or nail polish.  Do not wear lotions, powders, or perfumes, or deodorant.  Do not shave 48 hours prior to surgery.  Men may shave face and neck.  Do not bring valuables to the hospital.  [facility] is not responsible for any belongings or valuables.  Contacts, dentures or bridgework may not be worn into surgery.  Leave your suitcase in the  car.  After surgery it may be brought to your room.  For patients admitted to the hospital, discharge time will be determined by your treatment team.  Patients discharged the day of surgery will not be allowed to drive home.   Special instructions:    [place]- Preparing For Surgery  Before surgery, you can play an important role. Because skin is not sterile, your skin needs to be as free of germs as possible. You can reduce the number of germs on your skin by washing with CHG (chlorahexidine gluconate) Soap before surgery.  CHG is an antiseptic cleaner which kills germs and bonds with the skin to continue killing germs  even after washing.  Oral Hygiene is also important to reduce your risk of infection.  Remember - BRUSH YOUR TEETH THE MORNING OF SURGERY  Please do not use if you have an allergy to CHG or antibacterial soaps. If your skin becomes reddened/irritated stop using the CHG.  Do not shave (including legs and underarms) for at least 48 hours prior to first CHG shower. It is OK to shave your face.  Please follow these instructions carefully.   1. Shower the NIGHT BEFORE SURGERY and the MORNING OF SURGERY with CHG.   2. If you chose to wash your hair, wash your hair first as usual with your normal shampoo.  3. After you shampoo, rinse your hair and body thoroughly to remove the shampoo.  4. Use CHG as you would any other liquid soap. You can apply CHG directly to the skin and wash gently with a scrungie or a clean washcloth.   5. Apply the CHG Soap to your body ONLY FROM THE NECK DOWN.  Do not use on open wounds or open sores. Avoid contact with your eyes, ears, mouth and genitals (private parts). Wash Face and genitals (private parts)  with your normal soap.  6. Wash thoroughly, paying special attention to the area where your surgery will be performed.  7. Thoroughly rinse your body with warm water from the neck down.  8. DO NOT shower/wash with your normal soap after using and rinsing off the CHG Soap.  9. Pat yourself dry with a CLEAN TOWEL.  10. Wear CLEAN PAJAMAS to bed the night before surgery, wear comfortable clothes the morning of surgery  11. Place CLEAN SHEETS on your bed the night of your first shower and DO NOT SLEEP WITH PETS.    Day of Surgery:  Do not apply any deodorants/lotions.  Please wear clean clothes to the hospital/surgery center.   Remember to brush your teeth.      Please read over the following fact sheets that you were given. Coughing and Deep Breathing, Total Joint Packet, MRSA Information and Surgical Site Infection Prevention

## 2017-09-06 ENCOUNTER — Other Ambulatory Visit: Payer: Self-pay

## 2017-09-06 ENCOUNTER — Encounter (HOSPITAL_COMMUNITY)
Admission: RE | Admit: 2017-09-06 | Discharge: 2017-09-06 | Disposition: A | Discharge status: Home / Self Care | Payer: Medicare Other | Source: Ambulatory Visit | Attending: Orthopedic Surgery | Admitting: Orthopedic Surgery

## 2017-09-06 ENCOUNTER — Encounter (HOSPITAL_COMMUNITY): Payer: Self-pay

## 2017-09-06 DIAGNOSIS — Z87891 Personal history of nicotine dependence: Secondary | ICD-10-CM | POA: Insufficient documentation

## 2017-09-06 DIAGNOSIS — Z7982 Long term (current) use of aspirin: Secondary | ICD-10-CM | POA: Diagnosis not present

## 2017-09-06 DIAGNOSIS — Z951 Presence of aortocoronary bypass graft: Secondary | ICD-10-CM | POA: Insufficient documentation

## 2017-09-06 DIAGNOSIS — M069 Rheumatoid arthritis, unspecified: Secondary | ICD-10-CM | POA: Diagnosis not present

## 2017-09-06 DIAGNOSIS — Z01818 Encounter for other preprocedural examination: Secondary | ICD-10-CM | POA: Insufficient documentation

## 2017-09-06 DIAGNOSIS — F329 Major depressive disorder, single episode, unspecified: Secondary | ICD-10-CM | POA: Diagnosis not present

## 2017-09-06 DIAGNOSIS — Z7951 Long term (current) use of inhaled steroids: Secondary | ICD-10-CM | POA: Diagnosis not present

## 2017-09-06 DIAGNOSIS — Z955 Presence of coronary angioplasty implant and graft: Secondary | ICD-10-CM | POA: Diagnosis not present

## 2017-09-06 DIAGNOSIS — Z96651 Presence of right artificial knee joint: Secondary | ICD-10-CM | POA: Diagnosis not present

## 2017-09-06 DIAGNOSIS — Z7984 Long term (current) use of oral hypoglycemic drugs: Secondary | ICD-10-CM | POA: Insufficient documentation

## 2017-09-06 DIAGNOSIS — E119 Type 2 diabetes mellitus without complications: Secondary | ICD-10-CM | POA: Insufficient documentation

## 2017-09-06 DIAGNOSIS — Z961 Presence of intraocular lens: Secondary | ICD-10-CM | POA: Insufficient documentation

## 2017-09-06 DIAGNOSIS — K219 Gastro-esophageal reflux disease without esophagitis: Secondary | ICD-10-CM | POA: Insufficient documentation

## 2017-09-06 DIAGNOSIS — I2510 Atherosclerotic heart disease of native coronary artery without angina pectoris: Secondary | ICD-10-CM | POA: Insufficient documentation

## 2017-09-06 DIAGNOSIS — Z01812 Encounter for preprocedural laboratory examination: Secondary | ICD-10-CM | POA: Diagnosis not present

## 2017-09-06 DIAGNOSIS — Z86718 Personal history of other venous thrombosis and embolism: Secondary | ICD-10-CM | POA: Diagnosis not present

## 2017-09-06 DIAGNOSIS — Z9842 Cataract extraction status, left eye: Secondary | ICD-10-CM | POA: Diagnosis not present

## 2017-09-06 DIAGNOSIS — M1612 Unilateral primary osteoarthritis, left hip: Secondary | ICD-10-CM | POA: Diagnosis not present

## 2017-09-06 DIAGNOSIS — I10 Essential (primary) hypertension: Secondary | ICD-10-CM | POA: Diagnosis not present

## 2017-09-06 DIAGNOSIS — Z79899 Other long term (current) drug therapy: Secondary | ICD-10-CM | POA: Diagnosis not present

## 2017-09-06 DIAGNOSIS — Z9841 Cataract extraction status, right eye: Secondary | ICD-10-CM | POA: Diagnosis not present

## 2017-09-06 DIAGNOSIS — G4733 Obstructive sleep apnea (adult) (pediatric): Secondary | ICD-10-CM | POA: Diagnosis not present

## 2017-09-06 HISTORY — DX: Acute embolism and thrombosis of unspecified deep veins of unspecified lower extremity: I82.409

## 2017-09-06 LAB — CBC
HCT: 42.6 % (ref 39.0–52.0)
HEMOGLOBIN: 14.4 g/dL (ref 13.0–17.0)
MCH: 31 pg (ref 26.0–34.0)
MCHC: 33.8 g/dL (ref 30.0–36.0)
MCV: 91.8 fL (ref 78.0–100.0)
PLATELETS: 297 10*3/uL (ref 150–400)
RBC: 4.64 MIL/uL (ref 4.22–5.81)
RDW: 11.9 % (ref 11.5–15.5)
WBC: 7.8 10*3/uL (ref 4.0–10.5)

## 2017-09-06 LAB — GLUCOSE, CAPILLARY: GLUCOSE-CAPILLARY: 177 mg/dL — AB (ref 65–99)

## 2017-09-06 LAB — BASIC METABOLIC PANEL
Anion gap: 9 (ref 5–15)
BUN: 20 mg/dL (ref 6–20)
CHLORIDE: 103 mmol/L (ref 101–111)
CO2: 26 mmol/L (ref 22–32)
Calcium: 9.2 mg/dL (ref 8.9–10.3)
Creatinine, Ser: 1.14 mg/dL (ref 0.61–1.24)
Glucose, Bld: 182 mg/dL — ABNORMAL HIGH (ref 65–99)
Potassium: 4.7 mmol/L (ref 3.5–5.1)
SODIUM: 138 mmol/L (ref 135–145)

## 2017-09-06 LAB — SURGICAL PCR SCREEN
MRSA, PCR: NEGATIVE
STAPHYLOCOCCUS AUREUS: POSITIVE — AB

## 2017-09-06 NOTE — Progress Notes (Signed)
REQUESTED EKG TRACING FROM DR. Granjeno ON CHART.  REQUESTED SLEEP STUDY FROM DR. BENNETT'S OFFICE.  REQUESTED CARDIAC CATH FROM [facility].

## 2017-09-06 NOTE — Progress Notes (Signed)
REQUESTED EKG TRACING FROM DR. Brazos ON CHART.  REQUESTED SLEEP STUDY FROM DR. BENNETT'S OFFICE. ([phone])  REQUESTED CARDIAC CATH FROM [facility].

## 2017-09-07 LAB — HEMOGLOBIN A1C
Hgb A1c MFr Bld: 7.4 % — ABNORMAL HIGH (ref 4.8–5.6)
MEAN PLASMA GLUCOSE: 166 mg/dL

## 2017-09-07 NOTE — Progress Notes (Signed)
Anesthesia Chart Review:  Case:  [id number] Date/Time:  09/18/17 1015   Procedure:  LEFT TOTAL HIP ARTHROPLASTY (Left Hip)   Anesthesia type:  Choice   Pre-op diagnosis:  djd left hip   Location:  [facility] OR ROOM 04 / [place] [facility]   Surgeon:  [name], MD      DISCUSSION: Patient is a 77 year old male scheduled for the above procedure. History includes former smoker (quit '94), CAD (s/p CABG '04; LIMA-LAD, SVG-D1, SVG-OM1, SVG-RPDA; grafts patent 06/13/10), HTN, RA, OSA (CPAP). RLE DVT (while in the [employer]).  He has cardiac clearance for surgery from Dr. Nehemiah Massed. If no acute changes then I anticipate that he can proceed as planned.  VS: BP (!) 154/72   Pulse (!) 59   Temp 36.7 C   Resp 20   Ht 6' 2.5" (1.892 m)   Wt 270 lb (122.5 kg)   SpO2 96%   BMI 34.20 kg/m   PROVIDERS: Derinda Late, MD is PCP [facility] Route, see [place]). Rep    - Serafina Royals, MD is cardiologist [facility]; see [facility]). Last visit 07/31/17. He wrote, "Proceed to surgery and/or invasive procedure without restriction to pre or post operative and/or procedural care. The patient is at lowest risk possible for cardiovascular complications with surgical intervention and/or invasive procedure. Currently has no evidence active and/or significant angina and/or congestive heart failure. The patient may discontinue aspirin 4 days prior to procedure and restart at a safe period thereafter" - Hortencia Pilar, MD is vascular surgeon. Last sen by Marcelle Overlie, PA-C on 06/27/17 for lymphedema. PRN follow-up recommended.  LABS: Labs reviewed: Acceptable for surgery. (all labs ordered are listed, but only abnormal results are displayed)  Labs Reviewed  SURGICAL PCR SCREEN - Abnormal; Notable for the following components:      Result Value   Staphylococcus aureus POSITIVE (*)    All other components within normal limits  GLUCOSE, CAPILLARY - Abnormal; Notable for the following  components:   Glucose-Capillary 177 (*)    All other components within normal limits  BASIC METABOLIC PANEL - Abnormal; Notable for the following components:   Glucose, Bld 182 (*)    All other components within normal limits  HEMOGLOBIN A1C - Abnormal; Notable for the following components:   Hgb A1c MFr Bld 7.4 (*)    All other components within normal limits  CBC    EKG: 09/21/16 EKG [facility]): SR with sinus arrhythmia, first-degree AV block.  Left axis deviation.  Pulmonary disease pattern.  Nonspecific T wave abnormalities.   CV: Nuclear stress test 08/01/17 ([place]): Result Impression: Normal treadmill EKG without evidence of ischemia or arrhythmia Normal myocardial perfusion without evidence of myocardial ischemia. LVEF 50%.  Echo 07/31/17 ([place]): INTERPRETATION NORMAL LEFT VENTRICULAR SYSTOLIC FUNCTION WITH AN ESTIMATED EF = 55 % NORMAL RIGHT VENTRICULAR SYSTOLIC FUNCTION MODERATE TRICUSPID AND MITRAL VALVE INSUFFICIENCY NO VALVULAR STENOSIS MODERATE LA ENLARGEMENT MILD RV ENLARGEMENT MILD RA ENLARGEMENT  Cardiac cath 06/13/10 [facility], scanned under Media tab, Correspondence 05/21/12): Summary: Normal LV function Occluded native arteries. LIMA to LAD patent without evidence of disease. Vein grafts to D1, OM1, RPDA patent without evidence of disease. Recommendations: No further cardiac diagnostics at this time. Continue medical management of risk factors.   Past Medical History:  Diagnosis Date  . Arthritis    rheumatoid   . Coronary artery disease   . Depression   . Diabetes mellitus without complication ([place])   .  Diverticulosis   . DVT (deep venous thrombosis) ([place])    RIGHT LEG  WHILE IN [facility]   . GERD (gastroesophageal reflux disease)   . Hypertension   . Leg lesion    RIGHT WOUND CARE  . Localized osteoarthritis of right knee 05/21/2012  . Reflux   . Sleep apnea    CPAP  . Sleeping difficulty    takes med nightly   . Urgency of urination     Past Surgical History:  Procedure Laterality Date  . BACK SURGERY    . CATARACT EXTRACTION W/PHACO Right 12/16/2015   Procedure: CATARACT EXTRACTION PHACO AND INTRAOCULAR LENS PLACEMENT (IOC);  Surgeon: Eulogio Bear, MD;  Location: [facility] ORS;  Service: Ophthalmology;  Laterality: Right;  [place] 01:16 AP% 8.7 CDE 6.66 fluid pack lot # [id number]  . CATARACT EXTRACTION W/PHACO Left 02/03/2016   Procedure: CATARACT EXTRACTION PHACO AND INTRAOCULAR LENS PLACEMENT ([place]);  Surgeon: Eulogio Bear, MD;  Location: [facility] ORS;  Service: Ophthalmology;  Laterality: Left;  Lot# [id number] H [place]: 00:52.3 AP%: 13.8 CDE: 7.21  . CORONARY ANGIOPLASTY     STENT  . CORONARY ARTERY BYPASS GRAFT  2004   x4 vessels  . FOOT SURGERY     x2  . FOOT SURGERY     x2  . JOINT REPLACEMENT    . PENILE PROSTHESIS IMPLANT     PUMP  . TOTAL KNEE ARTHROPLASTY  05/21/2012   Procedure: TOTAL KNEE ARTHROPLASTY;  Surgeon: Johnny Bridge, MD;  Location: [facility] ORS;  Service: Orthopedics;  Laterality: Right;  . TRANSURETHRAL RESECTION OF PROSTATE      MEDICATIONS: . Ascorbic Acid (VITAMIN C) 1000 MG tablet  . aspirin EC 81 MG tablet  . azelastine (ASTELIN) 0.1 % nasal spray  . buPROPion (WELLBUTRIN XL) 300 MG 24 hr tablet  . busPIRone (BUSPAR) 15 MG tablet  . Calcium-Magnesium (CAL-MAG PO)  . Cholecalciferol (VITAMIN D3) 5000 units CAPS  . clonazePAM (KLONOPIN) 0.5 MG tablet  . Coenzyme Q10 (CO Q 10) 100 MG CAPS  . doxylamine, Sleep, (UNISOM) 25 MG tablet  . ezetimibe-simvastatin (VYTORIN) 10-20 MG tablet  . fish oil-omega-3 fatty acids 1000 MG capsule  . fluticasone (FLONASE) 50 MCG/ACT nasal spray  . irbesartan (AVAPRO) 150 MG tablet  . metFORMIN (GLUCOPHAGE) 1000 MG tablet  . Multiple Vitamin (MULTIVITAMIN WITH MINERALS) TABS  . pantoprazole (PROTONIX) 40 MG tablet  . sildenafil (VIAGRA) 50 MG tablet  . solifenacin (VESICARE) 10 MG tablet  . tamsulosin (FLOMAX) 0.4 MG CAPS capsule   . traMADol (ULTRAM) 50 MG tablet  . TURMERIC PO   No current facility-administered medications for this encounter.   He is to follow ASA instructions provided by surgeon. I left a voice message with Sherri at Dr. Luanna Cole office to follow-up with patient if ASA instructions had not already been provided.  George Hugh [facility] Short Stay Center/Anesthesiology Phone [phone] 09/07/2017 12:21 PM

## 2017-09-14 ENCOUNTER — Other Ambulatory Visit: Payer: Self-pay | Admitting: Orthopedic Surgery

## 2017-09-14 MED ORDER — DEXTROSE 5 % IV SOLN
3.0000 g | INTRAVENOUS | Status: AC
  Administered 2017-09-18: 3 g via INTRAVENOUS
  Filled 2017-09-14: qty 3

## 2017-09-14 NOTE — Care Plan (Signed)
Spoke with patient prior to surgery. He plans to discharge to home with family and HHPT provided by [facility]. His equipment has been ordered to be delivered to his room by [employer] pir[facility] to discharge. He will follow up with Dr. Mardelle Matte in the office on 10/01/17 @ 1145. And transtion to [place] at [facility] in [place] on 10/02/17 @ 1100. He is agreeable to the above plan.   Please contact Renee Cape Neddick, RNCM with questions or if this plan needs to change. [phone]  Thanks

## 2017-09-18 ENCOUNTER — Other Ambulatory Visit: Payer: Self-pay

## 2017-09-18 ENCOUNTER — Inpatient Hospital Stay (HOSPITAL_COMMUNITY): Payer: Medicare Other | Admitting: Vascular Surgery

## 2017-09-18 ENCOUNTER — Inpatient Hospital Stay (HOSPITAL_COMMUNITY): Payer: Medicare Other

## 2017-09-18 ENCOUNTER — Encounter (HOSPITAL_COMMUNITY)
Admission: RE | Disposition: A | Discharge status: Home / Self Care | Payer: Self-pay | Source: Home / Self Care | Attending: Orthopedic Surgery

## 2017-09-18 ENCOUNTER — Inpatient Hospital Stay (HOSPITAL_COMMUNITY)
Admission: RE | Admit: 2017-09-18 | Discharge: 2017-09-21 | DRG: 470 | Disposition: A | Discharge status: Home / Self Care | Payer: Medicare Other | Attending: Orthopedic Surgery | Admitting: Orthopedic Surgery

## 2017-09-18 ENCOUNTER — Encounter (HOSPITAL_COMMUNITY): Payer: Self-pay | Admitting: Certified Registered Nurse Anesthetist

## 2017-09-18 ENCOUNTER — Inpatient Hospital Stay (HOSPITAL_COMMUNITY): Payer: Medicare Other | Admitting: Certified Registered Nurse Anesthetist

## 2017-09-18 DIAGNOSIS — M79662 Pain in left lower leg: Secondary | ICD-10-CM | POA: Diagnosis not present

## 2017-09-18 DIAGNOSIS — G4700 Insomnia, unspecified: Secondary | ICD-10-CM | POA: Diagnosis not present

## 2017-09-18 DIAGNOSIS — Z86718 Personal history of other venous thrombosis and embolism: Secondary | ICD-10-CM | POA: Diagnosis not present

## 2017-09-18 DIAGNOSIS — I25810 Atherosclerosis of coronary artery bypass graft(s) without angina pectoris: Secondary | ICD-10-CM | POA: Diagnosis not present

## 2017-09-18 DIAGNOSIS — Z9079 Acquired absence of other genital organ(s): Secondary | ICD-10-CM

## 2017-09-18 DIAGNOSIS — Z7982 Long term (current) use of aspirin: Secondary | ICD-10-CM

## 2017-09-18 DIAGNOSIS — Z960 Presence of urogenital implants: Secondary | ICD-10-CM | POA: Diagnosis present

## 2017-09-18 DIAGNOSIS — M1612 Unilateral primary osteoarthritis, left hip: Secondary | ICD-10-CM | POA: Diagnosis present

## 2017-09-18 DIAGNOSIS — Z96651 Presence of right artificial knee joint: Secondary | ICD-10-CM | POA: Diagnosis present

## 2017-09-18 DIAGNOSIS — Z96642 Presence of left artificial hip joint: Secondary | ICD-10-CM | POA: Diagnosis not present

## 2017-09-18 DIAGNOSIS — R3915 Urgency of urination: Secondary | ICD-10-CM | POA: Diagnosis present

## 2017-09-18 DIAGNOSIS — Z9842 Cataract extraction status, left eye: Secondary | ICD-10-CM

## 2017-09-18 DIAGNOSIS — Z9841 Cataract extraction status, right eye: Secondary | ICD-10-CM

## 2017-09-18 DIAGNOSIS — Z955 Presence of coronary angioplasty implant and graft: Secondary | ICD-10-CM | POA: Diagnosis not present

## 2017-09-18 DIAGNOSIS — F329 Major depressive disorder, single episode, unspecified: Secondary | ICD-10-CM | POA: Diagnosis present

## 2017-09-18 DIAGNOSIS — G8918 Other acute postprocedural pain: Secondary | ICD-10-CM | POA: Diagnosis not present

## 2017-09-18 DIAGNOSIS — Z961 Presence of intraocular lens: Secondary | ICD-10-CM | POA: Diagnosis present

## 2017-09-18 DIAGNOSIS — E119 Type 2 diabetes mellitus without complications: Secondary | ICD-10-CM | POA: Diagnosis present

## 2017-09-18 DIAGNOSIS — Z87891 Personal history of nicotine dependence: Secondary | ICD-10-CM

## 2017-09-18 DIAGNOSIS — Z9989 Dependence on other enabling machines and devices: Secondary | ICD-10-CM | POA: Diagnosis not present

## 2017-09-18 DIAGNOSIS — Z951 Presence of aortocoronary bypass graft: Secondary | ICD-10-CM

## 2017-09-18 DIAGNOSIS — I10 Essential (primary) hypertension: Secondary | ICD-10-CM | POA: Diagnosis not present

## 2017-09-18 DIAGNOSIS — I2510 Atherosclerotic heart disease of native coronary artery without angina pectoris: Secondary | ICD-10-CM | POA: Diagnosis not present

## 2017-09-18 DIAGNOSIS — K219 Gastro-esophageal reflux disease without esophagitis: Secondary | ICD-10-CM | POA: Diagnosis not present

## 2017-09-18 DIAGNOSIS — Z79899 Other long term (current) drug therapy: Secondary | ICD-10-CM

## 2017-09-18 DIAGNOSIS — G4730 Sleep apnea, unspecified: Secondary | ICD-10-CM | POA: Diagnosis not present

## 2017-09-18 DIAGNOSIS — M069 Rheumatoid arthritis, unspecified: Secondary | ICD-10-CM | POA: Diagnosis not present

## 2017-09-18 DIAGNOSIS — Z96649 Presence of unspecified artificial hip joint: Secondary | ICD-10-CM

## 2017-09-18 DIAGNOSIS — Z471 Aftercare following joint replacement surgery: Secondary | ICD-10-CM | POA: Diagnosis not present

## 2017-09-18 DIAGNOSIS — E1151 Type 2 diabetes mellitus with diabetic peripheral angiopathy without gangrene: Secondary | ICD-10-CM | POA: Diagnosis not present

## 2017-09-18 DIAGNOSIS — M79609 Pain in unspecified limb: Secondary | ICD-10-CM | POA: Diagnosis not present

## 2017-09-18 DIAGNOSIS — Z7984 Long term (current) use of oral hypoglycemic drugs: Secondary | ICD-10-CM

## 2017-09-18 HISTORY — DX: Unilateral primary osteoarthritis, left hip: M16.12

## 2017-09-18 HISTORY — PX: TOTAL HIP ARTHROPLASTY: SHX124

## 2017-09-18 LAB — GLUCOSE, CAPILLARY
GLUCOSE-CAPILLARY: 191 mg/dL — AB (ref 65–99)
GLUCOSE-CAPILLARY: 226 mg/dL — AB (ref 65–99)
GLUCOSE-CAPILLARY: 230 mg/dL — AB (ref 65–99)
Glucose-Capillary: 214 mg/dL — ABNORMAL HIGH (ref 65–99)

## 2017-09-18 LAB — TYPE AND SCREEN
ABO/RH(D): O POS
ANTIBODY SCREEN: NEGATIVE

## 2017-09-18 LAB — ABO/RH: ABO/RH(D): O POS

## 2017-09-18 SURGERY — ARTHROPLASTY, HIP, TOTAL,POSTERIOR APPROACH
Anesthesia: General | Site: Hip | Laterality: Left

## 2017-09-18 MED ORDER — HYDROCODONE-ACETAMINOPHEN 7.5-325 MG PO TABS
1.0000 | ORAL_TABLET | ORAL | Status: DC | PRN
  Administered 2017-09-18 – 2017-09-20 (×4): 2 via ORAL
  Filled 2017-09-18 (×4): qty 2

## 2017-09-18 MED ORDER — METOCLOPRAMIDE HCL 5 MG/ML IJ SOLN: 5.0000 mg | Freq: Three times a day (TID) | INTRAMUSCULAR | Status: DC | PRN

## 2017-09-18 MED ORDER — ALUM & MAG HYDROXIDE-SIMETH 200-200-20 MG/5ML PO SUSP: 30.0000 mL | ORAL | Status: DC | PRN

## 2017-09-18 MED ORDER — BUPIVACAINE HCL (PF) 0.25 % IJ SOLN
INTRAMUSCULAR | Status: AC
  Filled 2017-09-18: qty 30

## 2017-09-18 MED ORDER — CALCIUM-MAGNESIUM 500-250 MG PO TABS: ORAL_TABLET | Freq: Every day | ORAL | Status: DC

## 2017-09-18 MED ORDER — BUPROPION HCL ER (XL) 300 MG PO TB24
300.0000 mg | ORAL_TABLET | Freq: Every day | ORAL | Status: DC
  Administered 2017-09-19 – 2017-09-21 (×3): 300 mg via ORAL
  Filled 2017-09-18 (×3): qty 1

## 2017-09-18 MED ORDER — POTASSIUM CHLORIDE IN NACL 20-0.45 MEQ/L-% IV SOLN
INTRAVENOUS | Status: DC
  Administered 2017-09-18: 18:00:00 via INTRAVENOUS
  Filled 2017-09-18 (×2): qty 1000

## 2017-09-18 MED ORDER — OMEGA-3 FATTY ACIDS 1000 MG PO CAPS: 1.0000 g | ORAL_CAPSULE | Freq: Every day | ORAL | Status: DC

## 2017-09-18 MED ORDER — TAMSULOSIN HCL 0.4 MG PO CAPS
0.4000 mg | ORAL_CAPSULE | Freq: Every day | ORAL | Status: DC
  Administered 2017-09-19 – 2017-09-21 (×3): 0.4 mg via ORAL
  Filled 2017-09-18 (×3): qty 1

## 2017-09-18 MED ORDER — CO Q 10 100 MG PO CAPS: 100.0000 mg | ORAL_CAPSULE | Freq: Every day | ORAL | Status: DC

## 2017-09-18 MED ORDER — DEXAMETHASONE SODIUM PHOSPHATE 10 MG/ML IJ SOLN
10.0000 mg | Freq: Once | INTRAMUSCULAR | Status: AC
  Administered 2017-09-19: 10 mg via INTRAVENOUS
  Filled 2017-09-18: qty 1

## 2017-09-18 MED ORDER — RIVAROXABAN 10 MG PO TABS: 10.0000 mg | ORAL_TABLET | Freq: Every day | ORAL | 0 refills | Status: DC

## 2017-09-18 MED ORDER — LIDOCAINE 2% (20 MG/ML) 5 ML SYRINGE
INTRAMUSCULAR | Status: AC
  Filled 2017-09-18: qty 5

## 2017-09-18 MED ORDER — HYDROMORPHONE HCL 2 MG/ML IJ SOLN
0.2500 mg | INTRAMUSCULAR | Status: DC | PRN
  Administered 2017-09-18 (×4): 0.5 mg via INTRAVENOUS

## 2017-09-18 MED ORDER — IRBESARTAN 150 MG PO TABS
150.0000 mg | ORAL_TABLET | Freq: Every day | ORAL | Status: DC
  Administered 2017-09-19 – 2017-09-21 (×3): 150 mg via ORAL
  Filled 2017-09-18 (×4): qty 1

## 2017-09-18 MED ORDER — ACETAMINOPHEN 500 MG PO TABS
500.0000 mg | ORAL_TABLET | Freq: Four times a day (QID) | ORAL | Status: AC
  Administered 2017-09-18 – 2017-09-19 (×4): 500 mg via ORAL
  Filled 2017-09-18 (×4): qty 1

## 2017-09-18 MED ORDER — DEXAMETHASONE SODIUM PHOSPHATE 10 MG/ML IJ SOLN
INTRAMUSCULAR | Status: DC | PRN
  Administered 2017-09-18: 5 mg via INTRAVENOUS

## 2017-09-18 MED ORDER — SENNA-DOCUSATE SODIUM 8.6-50 MG PO TABS: 2.0000 | ORAL_TABLET | Freq: Every day | ORAL | 1 refills | Status: AC

## 2017-09-18 MED ORDER — ROCURONIUM BROMIDE 50 MG/5ML IV SOSY
PREFILLED_SYRINGE | INTRAVENOUS | Status: DC | PRN
  Administered 2017-09-18: 20 mg via INTRAVENOUS
  Administered 2017-09-18: 50 mg via INTRAVENOUS
  Administered 2017-09-18: 20 mg via INTRAVENOUS
  Administered 2017-09-18: 10 mg via INTRAVENOUS

## 2017-09-18 MED ORDER — HYDROCODONE-ACETAMINOPHEN 5-325 MG PO TABS
ORAL_TABLET | ORAL | Status: AC
  Administered 2017-09-18: 1 via ORAL
  Filled 2017-09-18: qty 1

## 2017-09-18 MED ORDER — BACLOFEN 10 MG PO TABS: 10.0000 mg | ORAL_TABLET | Freq: Three times a day (TID) | ORAL | 0 refills | Status: DC

## 2017-09-18 MED ORDER — POLYETHYLENE GLYCOL 3350 17 G PO PACK
17.0000 g | PACK | Freq: Every day | ORAL | Status: DC | PRN
  Administered 2017-09-20: 17 g via ORAL

## 2017-09-18 MED ORDER — LACTATED RINGERS IV SOLN
INTRAVENOUS | Status: DC
  Administered 2017-09-18 (×2): via INTRAVENOUS

## 2017-09-18 MED ORDER — INSULIN ASPART 100 UNIT/ML ~~LOC~~ SOLN
0.0000 [IU] | Freq: Three times a day (TID) | SUBCUTANEOUS | Status: DC
  Administered 2017-09-18: 5 [IU] via SUBCUTANEOUS
  Administered 2017-09-19: 8 [IU] via SUBCUTANEOUS
  Administered 2017-09-19: 3 [IU] via SUBCUTANEOUS
  Administered 2017-09-19: 5 [IU] via SUBCUTANEOUS
  Administered 2017-09-20 – 2017-09-21 (×4): 2 [IU] via SUBCUTANEOUS

## 2017-09-18 MED ORDER — BISACODYL 10 MG RE SUPP: 10.0000 mg | Freq: Every day | RECTAL | Status: DC | PRN

## 2017-09-18 MED ORDER — SUGAMMADEX SODIUM 200 MG/2ML IV SOLN
INTRAVENOUS | Status: AC
  Filled 2017-09-18: qty 2

## 2017-09-18 MED ORDER — MAGNESIUM CITRATE PO SOLN: 1.0000 | Freq: Once | ORAL | Status: DC | PRN

## 2017-09-18 MED ORDER — PHENYLEPHRINE 40 MCG/ML (10ML) SYRINGE FOR IV PUSH (FOR BLOOD PRESSURE SUPPORT)
PREFILLED_SYRINGE | INTRAVENOUS | Status: DC | PRN
  Administered 2017-09-18: 120 ug via INTRAVENOUS

## 2017-09-18 MED ORDER — FENTANYL CITRATE (PF) 250 MCG/5ML IJ SOLN
INTRAMUSCULAR | Status: AC
  Filled 2017-09-18: qty 5

## 2017-09-18 MED ORDER — RIVAROXABAN 10 MG PO TABS
10.0000 mg | ORAL_TABLET | Freq: Every day | ORAL | Status: DC
  Administered 2017-09-19 – 2017-09-21 (×3): 10 mg via ORAL
  Filled 2017-09-18 (×3): qty 1

## 2017-09-18 MED ORDER — MENTHOL 3 MG MT LOZG: 1.0000 | LOZENGE | OROMUCOSAL | Status: DC | PRN

## 2017-09-18 MED ORDER — METHOCARBAMOL 1000 MG/10ML IJ SOLN: 500.0000 mg | Freq: Four times a day (QID) | INTRAVENOUS | Status: DC | PRN

## 2017-09-18 MED ORDER — VITAMIN D3 125 MCG (5000 UT) PO CAPS: 5000.0000 [IU] | ORAL_CAPSULE | Freq: Every day | ORAL | Status: DC

## 2017-09-18 MED ORDER — DARIFENACIN HYDROBROMIDE ER 15 MG PO TB24
15.0000 mg | ORAL_TABLET | Freq: Every day | ORAL | Status: DC
  Administered 2017-09-19 – 2017-09-21 (×3): 15 mg via ORAL
  Filled 2017-09-18 (×3): qty 1

## 2017-09-18 MED ORDER — METHOCARBAMOL 500 MG PO TABS
500.0000 mg | ORAL_TABLET | Freq: Four times a day (QID) | ORAL | Status: DC | PRN
  Administered 2017-09-19 – 2017-09-21 (×2): 500 mg via ORAL
  Filled 2017-09-18 (×3): qty 1

## 2017-09-18 MED ORDER — MIDAZOLAM HCL 2 MG/2ML IJ SOLN
INTRAMUSCULAR | Status: AC
  Filled 2017-09-18: qty 2

## 2017-09-18 MED ORDER — ONDANSETRON HCL 4 MG/2ML IJ SOLN
INTRAMUSCULAR | Status: AC
  Filled 2017-09-18: qty 2

## 2017-09-18 MED ORDER — ROCURONIUM BROMIDE 10 MG/ML (PF) SYRINGE
PREFILLED_SYRINGE | INTRAVENOUS | Status: AC
  Filled 2017-09-18: qty 5

## 2017-09-18 MED ORDER — HYDROMORPHONE HCL 2 MG/ML IJ SOLN
INTRAMUSCULAR | Status: AC
  Administered 2017-09-18: 0.5 mg via INTRAVENOUS
  Filled 2017-09-18: qty 1

## 2017-09-18 MED ORDER — CEFAZOLIN SODIUM-DEXTROSE 2-4 GM/100ML-% IV SOLN
2.0000 g | Freq: Four times a day (QID) | INTRAVENOUS | Status: AC
  Administered 2017-09-18 (×2): 2 g via INTRAVENOUS
  Filled 2017-09-18 (×2): qty 100

## 2017-09-18 MED ORDER — DOCUSATE SODIUM 100 MG PO CAPS
100.0000 mg | ORAL_CAPSULE | Freq: Two times a day (BID) | ORAL | Status: DC
  Administered 2017-09-18 – 2017-09-21 (×6): 100 mg via ORAL
  Filled 2017-09-18 (×6): qty 1

## 2017-09-18 MED ORDER — BUSPIRONE HCL 15 MG PO TABS
15.0000 mg | ORAL_TABLET | Freq: Two times a day (BID) | ORAL | Status: DC
  Administered 2017-09-18 – 2017-09-21 (×6): 15 mg via ORAL
  Filled 2017-09-18: qty 1
  Filled 2017-09-18: qty 3
  Filled 2017-09-18 (×2): qty 1
  Filled 2017-09-18: qty 3
  Filled 2017-09-18 (×4): qty 1

## 2017-09-18 MED ORDER — DEXAMETHASONE SODIUM PHOSPHATE 10 MG/ML IJ SOLN
INTRAMUSCULAR | Status: AC
Start: 2017-09-18 — End: 2017-09-18
  Filled 2017-09-18: qty 1

## 2017-09-18 MED ORDER — PROPOFOL 10 MG/ML IV BOLUS
INTRAVENOUS | Status: DC | PRN
  Administered 2017-09-18: 200 mg via INTRAVENOUS
  Administered 2017-09-18: 100 mg via INTRAVENOUS

## 2017-09-18 MED ORDER — ONDANSETRON HCL 4 MG/2ML IJ SOLN
INTRAMUSCULAR | Status: DC | PRN
  Administered 2017-09-18: 4 mg via INTRAVENOUS

## 2017-09-18 MED ORDER — TURMERIC 400 MG PO CAPS: 1000.0000 mg | ORAL_CAPSULE | Freq: Every day | ORAL | Status: DC

## 2017-09-18 MED ORDER — METFORMIN HCL 500 MG PO TABS
1000.0000 mg | ORAL_TABLET | Freq: Two times a day (BID) | ORAL | Status: DC
  Administered 2017-09-18 – 2017-09-21 (×6): 1000 mg via ORAL
  Filled 2017-09-18 (×6): qty 2

## 2017-09-18 MED ORDER — SODIUM CHLORIDE 0.9 % IR SOLN
Status: DC | PRN
  Administered 2017-09-18 (×2): 1000 mL

## 2017-09-18 MED ORDER — ACETAMINOPHEN 325 MG PO TABS: 325.0000 mg | ORAL_TABLET | Freq: Four times a day (QID) | ORAL | Status: DC | PRN

## 2017-09-18 MED ORDER — VITAMIN C 500 MG PO TABS
1000.0000 mg | ORAL_TABLET | Freq: Every day | ORAL | Status: DC
  Administered 2017-09-19 – 2017-09-21 (×3): 1000 mg via ORAL
  Filled 2017-09-18 (×3): qty 2

## 2017-09-18 MED ORDER — CALCIUM CARBONATE 1250 (500 CA) MG PO TABS
1.0000 | ORAL_TABLET | Freq: Every day | ORAL | Status: DC
  Administered 2017-09-19 – 2017-09-21 (×3): 500 mg via ORAL
  Filled 2017-09-18 (×3): qty 1

## 2017-09-18 MED ORDER — PHENYLEPHRINE 40 MCG/ML (10ML) SYRINGE FOR IV PUSH (FOR BLOOD PRESSURE SUPPORT)
PREFILLED_SYRINGE | INTRAVENOUS | Status: AC
  Filled 2017-09-18: qty 10

## 2017-09-18 MED ORDER — HYDROCODONE-ACETAMINOPHEN 5-325 MG PO TABS
1.0000 | ORAL_TABLET | ORAL | Status: DC | PRN
  Administered 2017-09-18: 1 via ORAL
  Administered 2017-09-20 – 2017-09-21 (×5): 2 via ORAL
  Filled 2017-09-18 (×5): qty 2

## 2017-09-18 MED ORDER — FLUTICASONE PROPIONATE 50 MCG/ACT NA SUSP
2.0000 | Freq: Every day | NASAL | Status: DC
  Administered 2017-09-18 – 2017-09-21 (×4): 2 via NASAL
  Filled 2017-09-18: qty 16

## 2017-09-18 MED ORDER — ADULT MULTIVITAMIN W/MINERALS CH
1.0000 | ORAL_TABLET | Freq: Every day | ORAL | Status: DC
  Administered 2017-09-19 – 2017-09-21 (×3): 1 via ORAL
  Filled 2017-09-18 (×3): qty 1

## 2017-09-18 MED ORDER — ZOLPIDEM TARTRATE 5 MG PO TABS: 5.0000 mg | ORAL_TABLET | Freq: Every evening | ORAL | Status: DC | PRN

## 2017-09-18 MED ORDER — KETOROLAC TROMETHAMINE 15 MG/ML IJ SOLN
INTRAMUSCULAR | Status: DC | PRN
  Administered 2017-09-18: 15 mg via INTRAVENOUS

## 2017-09-18 MED ORDER — LIDOCAINE 2% (20 MG/ML) 5 ML SYRINGE
INTRAMUSCULAR | Status: DC | PRN
  Administered 2017-09-18: 60 mg via INTRAVENOUS

## 2017-09-18 MED ORDER — SIMVASTATIN 20 MG PO TABS
20.0000 mg | ORAL_TABLET | Freq: Every day | ORAL | Status: DC
  Administered 2017-09-18 – 2017-09-20 (×3): 20 mg via ORAL
  Filled 2017-09-18 (×3): qty 1

## 2017-09-18 MED ORDER — CHLORHEXIDINE GLUCONATE 4 % EX LIQD: 60.0000 mL | Freq: Once | CUTANEOUS | Status: DC

## 2017-09-18 MED ORDER — PHENOL 1.4 % MT LIQD: 1.0000 | OROMUCOSAL | Status: DC | PRN

## 2017-09-18 MED ORDER — DOXYLAMINE SUCCINATE (SLEEP) 25 MG PO TABS: 25.0000 mg | ORAL_TABLET | Freq: Every day | ORAL | Status: DC

## 2017-09-18 MED ORDER — METOCLOPRAMIDE HCL 5 MG PO TABS: 5.0000 mg | ORAL_TABLET | Freq: Three times a day (TID) | ORAL | Status: DC | PRN

## 2017-09-18 MED ORDER — KETOROLAC TROMETHAMINE 15 MG/ML IJ SOLN
7.5000 mg | Freq: Four times a day (QID) | INTRAMUSCULAR | Status: AC
  Administered 2017-09-18 – 2017-09-19 (×4): 7.5 mg via INTRAVENOUS
  Filled 2017-09-18 (×4): qty 1

## 2017-09-18 MED ORDER — MORPHINE SULFATE (PF) 2 MG/ML IV SOLN: 0.5000 mg | INTRAVENOUS | Status: DC | PRN

## 2017-09-18 MED ORDER — PROMETHAZINE HCL 25 MG/ML IJ SOLN: 6.2500 mg | INTRAMUSCULAR | Status: DC | PRN

## 2017-09-18 MED ORDER — FENTANYL CITRATE (PF) 100 MCG/2ML IJ SOLN
INTRAMUSCULAR | Status: DC | PRN
  Administered 2017-09-18 (×5): 50 ug via INTRAVENOUS

## 2017-09-18 MED ORDER — CLONAZEPAM 0.5 MG PO TABS
0.5000 mg | ORAL_TABLET | Freq: Two times a day (BID) | ORAL | Status: DC | PRN
  Administered 2017-09-20: 0.5 mg via ORAL
  Filled 2017-09-18: qty 1

## 2017-09-18 MED ORDER — AZELASTINE HCL 0.1 % NA SOLN
1.0000 | Freq: Two times a day (BID) | NASAL | Status: DC
  Administered 2017-09-18 – 2017-09-21 (×6): 1 via NASAL
  Filled 2017-09-18: qty 30

## 2017-09-18 MED ORDER — ONDANSETRON HCL 4 MG/2ML IJ SOLN: 4.0000 mg | Freq: Four times a day (QID) | INTRAMUSCULAR | Status: DC | PRN

## 2017-09-18 MED ORDER — GABAPENTIN 300 MG PO CAPS
300.0000 mg | ORAL_CAPSULE | Freq: Three times a day (TID) | ORAL | Status: DC
  Administered 2017-09-18 – 2017-09-21 (×10): 300 mg via ORAL
  Filled 2017-09-18 (×10): qty 1

## 2017-09-18 MED ORDER — EZETIMIBE-SIMVASTATIN 10-20 MG PO TABS: 1.0000 | ORAL_TABLET | Freq: Every day | ORAL | Status: DC

## 2017-09-18 MED ORDER — BUPIVACAINE HCL (PF) 0.25 % IJ SOLN
INTRAMUSCULAR | Status: DC | PRN
  Administered 2017-09-18: 20 mL

## 2017-09-18 MED ORDER — SUGAMMADEX SODIUM 200 MG/2ML IV SOLN
INTRAVENOUS | Status: DC | PRN
  Administered 2017-09-18: 200 mg via INTRAVENOUS

## 2017-09-18 MED ORDER — ONDANSETRON HCL 4 MG PO TABS: 4.0000 mg | ORAL_TABLET | Freq: Three times a day (TID) | ORAL | 0 refills | Status: DC | PRN

## 2017-09-18 MED ORDER — PANTOPRAZOLE SODIUM 40 MG PO TBEC
40.0000 mg | DELAYED_RELEASE_TABLET | Freq: Every day | ORAL | Status: DC
  Administered 2017-09-19 – 2017-09-21 (×3): 40 mg via ORAL
  Filled 2017-09-18 (×3): qty 1

## 2017-09-18 MED ORDER — ONDANSETRON HCL 4 MG PO TABS: 4.0000 mg | ORAL_TABLET | Freq: Four times a day (QID) | ORAL | Status: DC | PRN

## 2017-09-18 SURGICAL SUPPLY — 52 items
BIT DRILL 5/64X5 DISP (BIT) ×3 IMPLANT
BLADE SAW SAG 73X25 THK (BLADE) ×1
BLADE SAW SGTL 73X25 THK (BLADE) ×2 IMPLANT
CAPT HIP TOTAL 2 ×3 IMPLANT
CLOSURE STERI-STRIP 1/2X4 (GAUZE/BANDAGES/DRESSINGS) ×2
CLSR STERI-STRIP ANTIMIC 1/2X4 (GAUZE/BANDAGES/DRESSINGS) ×4 IMPLANT
COVER SURGICAL LIGHT HANDLE (MISCELLANEOUS) ×3 IMPLANT
DECANTER SPIKE VIAL GLASS SM (MISCELLANEOUS) ×3 IMPLANT
DRAPE INCISE IOBAN 66X45 STRL (DRAPES) IMPLANT
DRAPE ORTHO SPLIT 77X108 STRL (DRAPES) ×4
DRAPE SURG ORHT 6 SPLT 77X108 (DRAPES) ×2 IMPLANT
DRAPE U-SHAPE 47X51 STRL (DRAPES) ×3 IMPLANT
DRSG MEPILEX BORDER 4X12 (GAUZE/BANDAGES/DRESSINGS) IMPLANT
DRSG MEPILEX BORDER 4X8 (GAUZE/BANDAGES/DRESSINGS) ×3 IMPLANT
DURAPREP 26ML APPLICATOR (WOUND CARE) ×6 IMPLANT
ELECT CAUTERY BLADE 6.4 (BLADE) ×3 IMPLANT
ELECT REM PT RETURN 9FT ADLT (ELECTROSURGICAL) ×3
ELECTRODE REM PT RTRN 9FT ADLT (ELECTROSURGICAL) ×1 IMPLANT
GLOVE BIOGEL PI ORTHO PRO SZ8 (GLOVE) ×4
GLOVE ORTHO TXT STRL SZ7.5 (GLOVE) ×3 IMPLANT
GLOVE PI ORTHO PRO STRL SZ8 (GLOVE) ×2 IMPLANT
GLOVE SURG ORTHO 8.0 STRL STRW (GLOVE) ×3 IMPLANT
GOWN STRL REUS W/ TWL XL LVL3 (GOWN DISPOSABLE) ×1 IMPLANT
GOWN STRL REUS W/TWL 2XL LVL3 (GOWN DISPOSABLE) ×3 IMPLANT
GOWN STRL REUS W/TWL XL LVL3 (GOWN DISPOSABLE) ×2
HOOD PEEL AWAY FACE SHEILD DIS (HOOD) ×3 IMPLANT
HOOD PEEL AWAY FLYTE STAYCOOL (MISCELLANEOUS) ×3 IMPLANT
KIT BASIN OR (CUSTOM PROCEDURE TRAY) ×3 IMPLANT
KIT TURNOVER KIT B (KITS) ×3 IMPLANT
MANIFOLD NEPTUNE II (INSTRUMENTS) ×3 IMPLANT
NDL SAFETY ECLIPSE 18X1.5 (NEEDLE) ×1 IMPLANT
NEEDLE 18GX1X1/2 (RX/OR ONLY) (NEEDLE) ×3 IMPLANT
NEEDLE HYPO 18GX1.5 SHARP (NEEDLE) ×2
NS IRRIG 1000ML POUR BTL (IV SOLUTION) ×3 IMPLANT
PACK TOTAL JOINT (CUSTOM PROCEDURE TRAY) ×3 IMPLANT
PAD ARMBOARD 7.5X6 YLW CONV (MISCELLANEOUS) ×6 IMPLANT
PILLOW ABDUCTION HIP (SOFTGOODS) ×3 IMPLANT
PRESSURIZER FEMORAL UNIV (MISCELLANEOUS) IMPLANT
RETRIEVER SUT HEWSON (MISCELLANEOUS) ×3 IMPLANT
SUCTION FRAZIER HANDLE 10FR (MISCELLANEOUS) ×2
SUCTION TUBE FRAZIER 10FR DISP (MISCELLANEOUS) ×1 IMPLANT
SUT FIBERWIRE #2 38 REV NDL BL (SUTURE) ×9
SUT VIC AB 0 CT1 27 (SUTURE) ×2
SUT VIC AB 0 CT1 27XBRD ANBCTR (SUTURE) ×1 IMPLANT
SUT VIC AB 2-0 CT1 27 (SUTURE) ×2
SUT VIC AB 2-0 CT1 TAPERPNT 27 (SUTURE) ×1 IMPLANT
SUT VIC AB 3-0 SH 8-18 (SUTURE) ×3 IMPLANT
SUTURE FIBERWR#2 38 REV NDL BL (SUTURE) ×3 IMPLANT
SYR CONTROL 10ML LL (SYRINGE) ×3 IMPLANT
TOWEL OR 17X24 6PK STRL BLUE (TOWEL DISPOSABLE) ×3 IMPLANT
TOWEL OR 17X26 10 PK STRL BLUE (TOWEL DISPOSABLE) ×3 IMPLANT
TRAY CATH 16FR W/PLASTIC CATH (SET/KITS/TRAYS/PACK) IMPLANT

## 2017-09-18 NOTE — Progress Notes (Signed)
Pt arrived to room 5N14 via bed. All questions answered to satisfaction. Received report from [place], [place] from PACU. See assessment. Will continue to monitor.

## 2017-09-18 NOTE — Transfer of Care (Signed)
Immediate Anesthesia Transfer of Care Note  Patient: Seth Mulberry Sr.  Procedure(s) Performed: LEFT TOTAL HIP ARTHROPLASTY (Left Hip)  Patient Location: PACU  Anesthesia Type:General  Level of Consciousness: awake, alert  and oriented  Airway & Oxygen Therapy: Patient Spontanous Breathing and Patient connected to face mask oxygen  Post-op Assessment: Report given to RN and Post -op Vital signs reviewed and stable  Post vital signs: Reviewed and stable  Last Vitals:  Vitals Value Taken Time  BP 128/70 09/18/2017  1:55 PM  Temp    Pulse 85 09/18/2017  1:56 PM  Resp 15 09/18/2017  1:56 PM  SpO2 99 % 09/18/2017  1:56 PM  Vitals shown include unvalidated device data.  Last Pain:  Vitals:   09/18/17 0915  TempSrc: Oral         Complications: No apparent anesthesia complications

## 2017-09-18 NOTE — Progress Notes (Signed)
Orthopedic Tech Progress Note Patient Details:  Seth LASHOMB Sr. 05-21-1940 [id number]  Patient ID: Seth Mulberry Sr., male   DOB: 04-Mar-1941, 77 y.o.   MRN: [id number] Pt cant have ohf due to age restrictions.  Karolee Stamps 09/18/2017, 10:58 PM

## 2017-09-18 NOTE — Op Note (Signed)
09/18/2017  1:32 PM  PATIENT:  Seth Mulberry Sr.   MRN: [id number]  PRE-OPERATIVE DIAGNOSIS: Left hip primary localized osteoarthritis  POST-OPERATIVE DIAGNOSIS:  same  PROCEDURE:  Procedure(s): LEFT TOTAL HIP ARTHROPLASTY  PREOPERATIVE INDICATIONS:    Seth SIDA Sr. is an 77 y.o. male who has a diagnosis of left hip primary localized osteoarthritis and elected for surgical management after failing conservative treatment.  The risks benefits and alternatives were discussed with the patient including but not limited to the risks of nonoperative treatment, versus surgical intervention including infection, bleeding, nerve injury, periprosthetic fracture, the need for revision surgery, dislocation, leg length discrepancy, blood clots, cardiopulmonary complications, morbidity, mortality, among others, and they were willing to proceed.     OPERATIVE REPORT     SURGEON:  [name], MD    ASSISTANT:  Joya Gaskins, OPA-C  (Present throughout the entire procedure,  necessary for completion of procedure in a timely manner, assisting with retraction, instrumentation, and closure)     ANESTHESIA: General with intraoperative injection of Marcaine  ESTIMATED BLOOD LOSS: 078 mL    COMPLICATIONS:  None.     UNIQUE ASPECTS OF THE CASE: Bone quality was overall quite good.  I had excellent fixation on the acetabulum with complete coverage.  There was hypertrophic women osteophyte throughout the acetabulum which I had removed with a rongure.  The hip did not have adequate stability with a +1, but with the +5 it did restore leg length and had excellent stability posteriorly.  COMPONENTS:  [employer] fit femur size 8 with a 36 mm +5 metallic head ball and a Gription Acetabular shell size 56, with an apex hole eliminator and a +4 neutral polyethylene liner.    PROCEDURE IN DETAIL:   The patient was met in the holding area and  identified.  The appropriate hip was identified  and marked at the operative site.  The patient was then transported to the OR  and  placed under anesthesia.  At that point, the patient was  placed in the lateral decubitus position with the operative side up and  secured to the operating room table and all bony prominences padded.     The operative lower extremity was prepped from the iliac crest to the distal leg.  Sterile draping was performed.  Time out was performed prior to incision.  He was extremely tight during the prepping and draping process.    A routine posterolateral approach was utilized via sharp dissection  carried down to the subcutaneous tissue.  Gross bleeders were Bovie coagulated.  The iliotibial band was identified and incised along the length of the skin incision.  Self-retaining retractors were  inserted.  With the hip internally rotated, the short external rotators  were identified. The piriformis and capsule was tagged with FiberWire, and the hip capsule released in a T-type fashion.  The femoral neck was exposed, and I resected the femoral neck using the appropriate jig. This was performed at approximately a thumb's breadth above the lesser trochanter.    I then exposed the deep acetabulum, cleared out any tissue including the ligamentum teres.  A wing retractor was placed.  After adequate visualization, I excised the labrum, and then sequentially reamed.  I placed the trial acetabulum, which seated nicely, and then impacted the real cup into place.  Appropriate version and inclination was confirmed clinically matching their bony anatomy, and also with the use of the jig.  No additional fixation was needed.  A trial polyethylene liner was placed and the wing retractor removed.    I then prepared the proximal femur using the cookie-cutter, the lateralizing reamer, and then sequentially reamed and broached.  A trial broach, neck, and head was utilized, and I reduced the hip and it was found to have excellent stability with  functional range of motion, the 1 was short, the 5 felt right. The trial components were then removed, and the real polyethylene liner was placed.  I then impacted the real femoral prosthesis into place into the appropriate version, slightly anteverted to the normal anatomy, and I impacted the real head ball into place. The hip was then reduced and taken through functional range of motion and found to have excellent stability. Leg lengths were restored.  I then used a 2 mm drill bits to pass the FiberWire suture from the capsule and piriformis through the greater trochanter, and secured this. Excellent posterior capsular repair was achieved. I also closed the T in the capsule.  I then irrigated the hip copiously again with pulse lavage, and repaired the fascia with Vicryl, followed by Vicryl for the subcutaneous tissue, Monocryl for the skin, Steri-Strips and sterile gauze. The wounds were injected. The patient was then awakened and returned to PACU in stable and satisfactory condition. There were no complications.  [name], MD Orthopedic Surgeon [phone]   09/18/2017 1:32 PM

## 2017-09-18 NOTE — Anesthesia Procedure Notes (Signed)
Procedure Name: Intubation Date/Time: 09/18/2017 11:39 AM Performed by: Genelle Bal, CRNA Pre-anesthesia Checklist: Patient identified, Emergency Drugs available, Suction available and Patient being monitored Patient Re-evaluated:Patient Re-evaluated prior to induction Oxygen Delivery Method: Circle system utilized Preoxygenation: Pre-oxygenation with 100% oxygen Induction Type: IV induction Ventilation: Mask ventilation without difficulty Laryngoscope Size: Miller and 2 Grade View: Grade I Tube type: Oral Tube size: 7.5 mm Number of attempts: 1 Airway Equipment and Method: Stylet and Oral airway Placement Confirmation: ETT inserted through vocal cords under direct vision,  positive ETCO2 and breath sounds checked- equal and bilateral Secured at: 23 cm Tube secured with: Tape Dental Injury: Teeth and Oropharynx as per pre-operative assessment

## 2017-09-18 NOTE — H&P (Signed)
PREOPERATIVE H&P  Chief Complaint: left hip pain  HPI: Seth SKIDMORE Sr. is a 77 y.o. male who presents for preoperative history and physical with a diagnosis of left hip primary localized osteoarthritis. Symptoms are rated as moderate to severe, and have been worsening.  This is significantly impairing activities of daily living.  He has elected for surgical management.   Past Medical History:  Diagnosis Date  . Arthritis    rheumatoid   . Coronary artery disease   . Depression   . Diabetes mellitus without complication ([place])   . Diverticulosis   . DVT (deep venous thrombosis) ([place])    RIGHT LEG  WHILE IN MILITARY   . GERD (gastroesophageal reflux disease)   . Hypertension   . Leg lesion    RIGHT WOUND CARE  . Localized osteoarthritis of right knee 05/21/2012  . Reflux   . Sleep apnea    CPAP  . Sleeping difficulty    takes med nightly  . Urgency of urination    Past Surgical History:  Procedure Laterality Date  . BACK SURGERY    . CATARACT EXTRACTION W/PHACO Right 12/16/2015   Procedure: CATARACT EXTRACTION PHACO AND INTRAOCULAR LENS PLACEMENT (I[facility]);  Surgeon: Eulogio Bear, MD;  Location: [facility] ORS;  Service: Ophthalmology;  Laterality: Right;  [place] 01:16 AP% 8.7 CDE 6.66 fluid pack lot # [id number]  . CATARACT EXTRACTION W/PHACO Left 02/03/2016   Procedure: CATARACT EXTRACTION PHACO AND INTRAOCULAR LENS PLACEMENT ([place]);  Surgeon: Eulogio Bear, MD;  Location: [facility] ORS;  Service: Ophthalmology;  Laterality: Left;  Lot# [id number] H [place]: 00:52.3 AP%: 13.8 CDE: 7.21  . CORONARY ANGIOPLASTY     STENT  . CORONARY ARTERY BYPASS GRAFT  2004   x4 vessels  . FOOT SURGERY     x2  . FOOT SURGERY     x2  . JOINT REPLACEMENT    . PENILE PROSTHESIS IMPLANT     PUMP  . TOTAL KNEE ARTHROPLASTY  05/21/2012   Procedure: TOTAL KNEE ARTHROPLASTY;  Surgeon: Johnny Bridge, MD;  Location: [facility] ORS;  Service: Orthopedics;  Laterality: Right;  . TRANSURETHRAL RESECTION OF PROSTATE      Social History   Socioeconomic History  . Marital status: Widowed    Spouse name: Not on file  . Number of children: Not on file  . Years of education: Not on file  . Highest education level: Not on file  Occupational History  . Not on file  Social Needs  . Financial resource strain: Not on file  . Food insecurity:    Worry: Not on file    Inability: Not on file  . Transportation needs:    Medical: Not on file    Non-medical: Not on file  Tobacco Use  . Smoking status: Former Smoker    Last attempt to quit: 05/14/1992    Years since quitting: 25.3  . Smokeless tobacco: Never Used  Substance and Sexual Activity  . Alcohol use: Yes    Alcohol/week: 0.6 oz    Types: 1 Glasses of wine per week  . Drug use: No  . Sexual activity: Not on file  Lifestyle  . Physical activity:    Days per week: Not on file    Minutes per session: Not on file  . Stress: Not on file  Relationships  . Social connections:    Talks on phone: Not on file    Gets together: Not on file    Attends religious service: Not on  file    Active member of club or organization: Not on file    Attends meetings of clubs or organizations: Not on file    Relationship status: Not on file  Other Topics Concern  . Not on file  Social History Narrative  . Not on file   Family History  Problem Relation Age of Onset  . Bladder Cancer Brother   . Heart disease Father   . Kidney disease Neg Hx   . Prostate cancer Neg Hx    No Known Allergies Prior to Admission medications   Medication Sig Start Date End Date Taking? Authorizing Provider  Ascorbic Acid (VITAMIN C) 1000 MG tablet Take 1,000 mg by mouth daily.    Yes [provider]  aspirin EC 81 MG tablet Take 81 mg by mouth daily.   Yes [provider]  azelastine (ASTELIN) 0.1 % nasal spray Place 1 spray into both nostrils 2 (two) times daily. Use in each nostril as directed    Yes [provider]  buPROPion (WELLBUTRIN XL) 300 MG  24 hr tablet TAKE 1 TABLET DAILY 05/01/16  Yes Roselee Nova, MD  busPIRone (BUSPAR) 15 MG tablet Take 1 tablet (15 mg total) by mouth 2 (two) times daily. 02/18/16  Yes Roselee Nova, MD  Calcium-Magnesium (CAL-MAG PO) Take 1 tablet by mouth daily.   Yes [provider]  Cholecalciferol (VITAMIN D3) 5000 units CAPS Take 5,000 Units by mouth daily.    Yes [provider]  clonazePAM (KLONOPIN) 0.5 MG tablet Take 1 tablet (0.5 mg total) by mouth 2 (two) times daily as needed. anxiety Patient taking differently: Take 0.5 mg by mouth at bedtime.  02/18/16  Yes Rochel Brome A, MD  Coenzyme Q10 (CO Q 10) 100 MG CAPS Take 100 mg by mouth daily.    Yes [provider]  doxylamine, Sleep, (UNISOM) 25 MG tablet Take 25 mg by mouth at bedtime.   Yes [provider]  ezetimibe-simvastatin (VYTORIN) 10-20 MG tablet TAKE 1 TABLET DAILY AT 6 P.M 05/01/16  Yes Roselee Nova, MD  fish oil-omega-3 fatty acids 1000 MG capsule Take 1 g by mouth daily.    Yes [provider]  fluticasone (FLONASE) 50 MCG/ACT nasal spray Place 2 sprays into both nostrils daily. Patient taking differently: Place 2 sprays into both nostrils 2 (two) times daily.  10/05/15  Yes Roselee Nova, MD  irbesartan (AVAPRO) 150 MG tablet TAKE 1 TABLET DAILY 03/14/16  Yes Roselee Nova, MD  metFORMIN (GLUCOPHAGE) 1000 MG tablet Take 1 tablet (1,000 mg total) by mouth 2 (two) times daily with a meal. 04/11/16  Yes Roselee Nova, MD  Multiple Vitamin (MULTIVITAMIN WITH MINERALS) TABS Take 1 tablet by mouth daily.   Yes [provider]  pantoprazole (PROTONIX) 40 MG tablet Take 1 tablet (40 mg total) by mouth daily. 04/11/16  Yes Roselee Nova, MD  sildenafil (VIAGRA) 50 MG tablet Take 1 tablet (50 mg total) by mouth daily as needed for erectile dysfunction. 1 tablet po 30 mins prior to sexual activity 05/13/15  Yes Roselee Nova, MD  solifenacin (VESICARE) 10 MG tablet  Take 1 tablet (10 mg total) by mouth daily. 01/06/15  Yes McGowan, Larene Beach A, PA-C  tamsulosin (FLOMAX) 0.4 MG CAPS capsule Take 0.4 mg by mouth daily.    Yes [provider]  TURMERIC PO Take 1,000 mg by mouth daily at 12  noon.   Yes [provider]  traMADol (ULTRAM) 50 MG tablet One to Two Tabs Every Six Hours As Needed For Pain Patient not taking: Reported on 09/04/2017 06/27/17   Stegmayer, Joelene Millin A, PA-C     Positive ROS: All other systems have been reviewed and were otherwise negative with the exception of those mentioned in the HPI and as above.  Physical Exam: General: Alert, no acute distress Cardiovascular: No pedal edema Respiratory: No cyanosis, no use of accessory musculature GI: No organomegaly, abdomen is soft and non-tender Skin: No lesions in the area of chief complaint Neurologic: Sensation intact distally Psychiatric: Patient is competent for consent with normal mood and affect Lymphatic: No axillary or cervical lymphadenopathy  MUSCULOSKELETAL: Left hip EHL and FHL are intact.  Range of motion 0 to 80 degrees with almost no internal or external rotation.  Assessment: Left hip primary localized osteoarthritis   Plan: Plan for Procedure(s): LEFT TOTAL HIP ARTHROPLASTY  The risks benefits and alternatives were discussed with the patient including but not limited to the risks of nonoperative treatment, versus surgical intervention including infection, bleeding, nerve injury, periprosthetic fracture, the need for revision surgery, dislocation, leg length discrepancy, blood clots, cardiopulmonary complications, morbidity, mortality, among others, and they were willing to proceed.    Preoperative templating of the joint replacement has been completed, documented, and submitted to the Operating Room personnel in order to optimize intra-operative equipment management.  Johnny Bridge, MD Cell [phone]   09/18/2017 10:02 AM

## 2017-09-18 NOTE — Anesthesia Preprocedure Evaluation (Addendum)
Anesthesia Evaluation  Patient identified by MRN, date of birth, ID band Patient awake    Reviewed: Allergy & Precautions, NPO status , Patient's Chart, lab work & pertinent test results  Airway Mallampati: II  TM Distance: >3 FB Neck ROM: Full    Dental  (+) Dental Advisory Given   Pulmonary sleep apnea , former smoker,    breath sounds clear to auscultation       Cardiovascular hypertension, Pt. on medications + CAD, + CABG and + Peripheral Vascular Disease   Rhythm:Regular Rate:Normal     Neuro/Psych Anxiety Depression negative neurological ROS     GI/Hepatic Neg liver ROS, GERD  ,  Endo/Other  diabetes, Type 2  Renal/GU negative Renal ROS     Musculoskeletal  (+) Arthritis ,   Abdominal   Peds  Hematology negative hematology ROS (+)   Anesthesia Other Findings   Reproductive/Obstetrics                             Lab Results  Component Value Date   WBC 7.8 09/06/2017   HGB 14.4 09/06/2017   HCT 42.6 09/06/2017   MCV 91.8 09/06/2017   PLT 297 09/06/2017   Lab Results  Component Value Date   CREATININE 1.14 09/06/2017   BUN 20 09/06/2017   NA 138 09/06/2017   K 4.7 09/06/2017   CL 103 09/06/2017   CO2 26 09/06/2017    Anesthesia Physical Anesthesia Plan  ASA: III  Anesthesia Plan: General   Post-op Pain Management:    Induction: Intravenous  PONV Risk Score and Plan: 2 and Dexamethasone, Ondansetron and Treatment may vary due to age or medical condition  Airway Management Planned: LMA and Oral ETT  Additional Equipment:   Intra-op Plan:   Post-operative Plan: Extubation in OR  Informed Consent: I have reviewed the patients History and Physical, chart, labs and discussed the procedure including the risks, benefits and alternatives for the proposed anesthesia with the patient or authorized representative who has indicated his/her understanding and acceptance.    Dental advisory given  Plan Discussed with: CRNA  Anesthesia Plan Comments:        Anesthesia Quick Evaluation

## 2017-09-18 NOTE — Evaluation (Signed)
Physical Therapy Evaluation Patient Details Name: Seth BORMAN Sr. MRN: [id number] DOB: February 23, 1941 Today's Date: 09/18/2017   History of Present Illness  Pt is a 77 y/o male s/p elective L THA, posterior approach. PMH includes CAD, DM, DVT, HTN, OSA on CPAP, and back surgery.   Clinical Impression  Pt is s/p surgery above with deficits below. Pt able to perform steps along EOB using RW this session, however, limited by dizziness and pain. Required min to min guard A for mobility tasks. Educated about posterior hip precautions and supine HEP. Will continue to follow acutely to maximize functional mobility independence and safety.     Follow Up Recommendations Follow surgeon's recommendation for DC plan and follow-up therapies;Supervision for mobility/OOB    Equipment Recommendations  Rolling walker with 5" wheels;3in1 (PT)    Recommendations for Other Services OT consult     Precautions / Restrictions Precautions Precautions: Posterior Hip Precaution Booklet Issued: Yes (comment) Precaution Comments: Reviewed posterior hip precautions and supine HEP.  Restrictions Weight Bearing Restrictions: Yes LLE Weight Bearing: Weight bearing as tolerated      Mobility  Bed Mobility Overal bed mobility: Needs Assistance Bed Mobility: Supine to Sit;Sit to Supine     Supine to sit: Mod assist Sit to supine: Min assist   General bed mobility comments: Mod A for LLE assist and for trunk elevation to come to sitting. Verbal cues throughout for maintaining hip precautions. Increased time required. Min A for LLE assist to return to supine.   Transfers Overall transfer level: Needs assistance Equipment used: Rolling walker (2 wheeled) Transfers: Sit to/from Stand Sit to Stand: Min assist;From elevated surface         General transfer comment: Min A for lift assist and steadying assist from elevated surface. Verbal cues for safe hand placement. Some dizziness reported upon standing.    Ambulation/Gait Ambulation/Gait assistance: Min guard Ambulation Distance (Feet): 1 Feet Assistive device: Rolling walker (2 wheeled) Gait Pattern/deviations: Step-to pattern;Decreased step length - right;Decreased step length - left;Decreased weight shift to left;Antalgic Gait velocity: Decreased  Gait velocity interpretation: <1.31 ft/sec, indicative of household ambulator General Gait Details: Able to take a few steps towards EOB with min guard. Pt with some dizziness and pain, therefore further mobility limited.   Stairs            Wheelchair Mobility    Modified Rankin (Stroke Patients Only)       Balance Overall balance assessment: Needs assistance Sitting-balance support: No upper extremity supported;Feet supported Sitting balance-Leahy Scale: Good     Standing balance support: Bilateral upper extremity supported;During functional activity Standing balance-Leahy Scale: Poor Standing balance comment: Reliant on BUE support                              Pertinent Vitals/Pain Pain Assessment: Faces Faces Pain Scale: Hurts whole lot Pain Location: L hip  Pain Descriptors / Indicators: Aching;Operative site guarding Pain Intervention(s): Limited activity within patient's tolerance;Monitored during session;Repositioned    Home Living Family/patient expects to be discharged to:: Private residence Living Arrangements: Alone Available Help at Discharge: Family;Available 24 hours/day Type of Home: Apartment Home Access: Level entry     Home Layout: One level Home Equipment: Tub bench      Prior Function Level of Independence: Independent               Hand Dominance        Extremity/Trunk Assessment  Upper Extremity Assessment Upper Extremity Assessment: Defer to OT evaluation    Lower Extremity Assessment Lower Extremity Assessment: LLE deficits/detail LLE Deficits / Details: Deficits consistent with post op pain and weakness.  Able to perform ther ex below.     Cervical / Trunk Assessment Cervical / Trunk Assessment: Normal  Communication   Communication: No difficulties  Cognition Arousal/Alertness: Awake/alert Behavior During Therapy: WFL for tasks assessed/performed Overall Cognitive Status: Within Functional Limits for tasks assessed                                        General Comments General comments (skin integrity, edema, etc.): Pt's grandson present during session.     Exercises Total Joint Exercises Ankle Circles/Pumps: AROM;Both;20 reps Quad Sets: AROM;Right;10 reps   Assessment/Plan    PT Assessment Patient needs continued PT services  PT Problem List Decreased strength;Decreased range of motion;Decreased activity tolerance;Decreased balance;Decreased mobility;Decreased knowledge of use of DME;Decreased knowledge of precautions;Pain       PT Treatment Interventions DME instruction;Gait training;Functional mobility training;Therapeutic activities;Therapeutic exercise;Balance training;Patient/family education    PT Goals (Current goals can be found in the Care Plan section)  Acute Rehab PT Goals Patient Stated Goal: to go home  PT Goal Formulation: With patient Time For Goal Achievement: 10/02/17 Potential to Achieve Goals: Good    Frequency 7X/week   Barriers to discharge        Co-evaluation               AM-PAC PT "6 Clicks" Daily Activity  Outcome Measure Difficulty turning over in bed (including adjusting bedclothes, sheets and blankets)?: A Little Difficulty moving from lying on back to sitting on the side of the bed? : Unable Difficulty sitting down on and standing up from a chair with arms (e.g., wheelchair, bedside commode, etc,.)?: Unable Help needed moving to and from a bed to chair (including a wheelchair)?: A Little Help needed walking in hospital room?: A Little Help needed climbing 3-5 steps with a railing? : A Lot 6 Click Score: 13     End of Session Equipment Utilized During Treatment: Gait belt Activity Tolerance: Patient limited by pain Patient left: in bed;with call bell/phone within reach;with family/visitor present Nurse Communication: Mobility status PT Visit Diagnosis: Other abnormalities of gait and mobility (R26.89);Pain Pain - Right/Left: Left Pain - part of body: Hip    Time: 3846-6599 PT Time Calculation (min) (ACUTE ONLY): 34 min   Charges:   PT Evaluation $PT Eval Low Complexity: 1 Low PT Treatments $Therapeutic Activity: 8-22 mins   PT G Codes:        Leighton Ruff, PT, DPT  Acute Rehabilitation Services  Pager: [phone]   Rudean Hitt 09/18/2017, 5:22 PM

## 2017-09-19 ENCOUNTER — Encounter (HOSPITAL_COMMUNITY): Payer: Self-pay | Admitting: Orthopedic Surgery

## 2017-09-19 LAB — CBC
HEMATOCRIT: 32.2 % — AB (ref 39.0–52.0)
HEMOGLOBIN: 10.8 g/dL — AB (ref 13.0–17.0)
MCH: 31.1 pg (ref 26.0–34.0)
MCHC: 33.5 g/dL (ref 30.0–36.0)
MCV: 92.8 fL (ref 78.0–100.0)
Platelets: 211 10*3/uL (ref 150–400)
RBC: 3.47 MIL/uL — AB (ref 4.22–5.81)
RDW: 11.9 % (ref 11.5–15.5)
WBC: 10.9 10*3/uL — ABNORMAL HIGH (ref 4.0–10.5)

## 2017-09-19 LAB — BASIC METABOLIC PANEL
Anion gap: 9 (ref 5–15)
BUN: 15 mg/dL (ref 6–20)
CHLORIDE: 101 mmol/L (ref 101–111)
CO2: 25 mmol/L (ref 22–32)
Calcium: 8 mg/dL — ABNORMAL LOW (ref 8.9–10.3)
Creatinine, Ser: 1.1 mg/dL (ref 0.61–1.24)
GFR calc non Af Amer: >60 mL/min (ref >60)
Glucose, Bld: 197 mg/dL — ABNORMAL HIGH (ref 65–99)
POTASSIUM: 5.4 mmol/L — AB (ref 3.5–5.1)
SODIUM: 135 mmol/L (ref 135–145)

## 2017-09-19 LAB — GLUCOSE, CAPILLARY
GLUCOSE-CAPILLARY: 169 mg/dL — AB (ref 65–99)
GLUCOSE-CAPILLARY: 192 mg/dL — AB (ref 65–99)
GLUCOSE-CAPILLARY: 204 mg/dL — AB (ref 65–99)
Glucose-Capillary: 283 mg/dL — ABNORMAL HIGH (ref 65–99)
Glucose-Capillary: 224 mg/dL — ABNORMAL HIGH (ref 65–99)

## 2017-09-19 MED ORDER — SODIUM CHLORIDE 0.45 % IV SOLN
INTRAVENOUS | Status: DC
  Administered 2017-09-19: 10:00:00 via INTRAVENOUS

## 2017-09-19 MED ORDER — HYDROCODONE-ACETAMINOPHEN 10-325 MG PO TABS: 1.0000 | ORAL_TABLET | Freq: Four times a day (QID) | ORAL | 0 refills | Status: DC | PRN

## 2017-09-19 NOTE — Progress Notes (Signed)
     Subjective:  Patient reports pain as mild.  Doing well overall.    Objective:   VITALS:   Vitals:   09/18/17 1829 09/18/17 2115 09/19/17 0024 09/19/17 0527  BP:  118/73 123/69 112/61  Pulse:  85 80 67  Resp:  16 17 14   Temp:  98 F (36.7 C) 98.3 F (36.8 C) 98 F (36.7 C)  TempSrc:  Oral Oral Oral  SpO2: 98% 95% 97% 96%  Weight:      Height:        Neurologically intact Incision: scant drainage   Lab Results  Component Value Date   WBC 10.9 (H) 09/19/2017   HGB 10.8 (L) 09/19/2017   HCT 32.2 (L) 09/19/2017   MCV 92.8 09/19/2017   PLT 211 09/19/2017   BMET    Component Value Date/Time   NA 135 09/19/2017 0419   NA 140 11/11/2014 1030   NA 139 11/28/2011 1157   K 5.4 (H) 09/19/2017 0419   K 4.1 11/28/2011 1157   CL 101 09/19/2017 0419   CL 106 11/28/2011 1157   CO2 25 09/19/2017 0419   CO2 25 11/28/2011 1157   GLUCOSE 197 (H) 09/19/2017 0419   GLUCOSE 132 (H) 11/28/2011 1157   BUN 15 09/19/2017 0419   BUN 23 11/11/2014 1030   BUN 27 (H) 11/28/2011 1157   CREATININE 1.10 09/19/2017 0419   CREATININE 1.13 11/28/2011 1157   CALCIUM 8.0 (L) 09/19/2017 0419   CALCIUM 8.9 11/28/2011 1157   GFRNONAA >60 09/19/2017 0419   GFRNONAA >60 11/28/2011 1157   GFRAA >60 09/19/2017 0419   GFRAA >60 11/28/2011 1157     Assessment/Plan: 1 Day Post-Op   Principal Problem:   Primary localized osteoarthritis of left hip Active Problems:   Primary localized osteoarthritis of hip   Advance diet Up with therapy Plan for discharge tomorrow Discharge home with home health     Seth Mcintosh 09/19/2017, 7:42 AM   Seth Bond, MD Cell [phone]

## 2017-09-19 NOTE — Evaluation (Signed)
Occupational Therapy Evaluation Patient Details Name: Seth FECHER Sr. MRN: [id number] DOB: 18-Dec-1940 Today's Date: 09/19/2017    History of Present Illness Pt is a 77 y/o male s/p elective L THA, posterior approach. PMH includes CAD, DM, DVT, HTN, OSA on CPAP, and back surgery.    Clinical Impression   PTA, pt reports independence with ADL and functional mobility. He presents to OT evaluation with L hip pain impacting his ability to participate in ADL at [facility]. Pt currently requiring min assist for LB dressing tasks with adaptive equipment, min guard assist for LB bathing tasks with adaptive equipment, and min guard assist for toilet transfers. Pt educated concerning posterior hip precautions and impact on ADL participation and he verbalized understanding. Pt would benefit from continued OT services while admitted to improve independence and safety with ADL and functional mobility prior to returning home with assistance from family. OT will continue to follow while admitted.     Follow Up Recommendations  No OT follow up;Supervision/Assistance - 24 hour    Equipment Recommendations  Other (comment);3 in 1 bedside commode(AE kit for LB ADL)    Recommendations for Other Services       Precautions / Restrictions Precautions Precautions: Posterior Hip Precaution Booklet Issued: Yes (comment) Precaution Comments: Handout provided by PT. Reviewed posterior hip precautions and compensatory strategies related to ADL.  Restrictions Weight Bearing Restrictions: Yes LLE Weight Bearing: Weight bearing as tolerated      Mobility Bed Mobility               General bed mobility comments: OOB in recliner on my arrival.   Transfers Overall transfer level: Needs assistance Equipment used: Rolling walker (2 wheeled) Transfers: Sit to/from Stand Sit to Stand: From elevated surface;Supervision         General transfer comment: Cues for posterior hip precautions. Min guard for safety  from recliner and BSC over toilet.     Balance Overall balance assessment: Needs assistance Sitting-balance support: No upper extremity supported;Feet supported Sitting balance-Leahy Scale: Good     Standing balance support: Bilateral upper extremity supported;No upper extremity supported;During functional activity Standing balance-Leahy Scale: Fair Standing balance comment: Able to stand at sink for grooming tasks without UE support.                            ADL either performed or assessed with clinical judgement   ADL Overall ADL's : Needs assistance/impaired Eating/Feeding: Set up;Sitting   Grooming: Supervision/safety;Sitting   Upper Body Bathing: Set up;Sitting   Lower Body Bathing: Min guard;Sit to/from stand;With adaptive equipment;Adhering to hip precautions Lower Body Bathing Details (indicate cue type and reason): Educated on use of long handled sponge for LB bathing tasks.  Upper Body Dressing : Set up;Sitting   Lower Body Dressing: Minimal assistance;Sit to/from stand;With adaptive equipment;Adhering to hip precautions Lower Body Dressing Details (indicate cue type and reason): Educated concerning use of reacher, sock aide, and dressing stick for LB ADL tasks.  Toilet Transfer: Ambulation;BSC;RW;Min Psychiatric nurse Details (indicate cue type and reason): BSC over toilet for elevated seat Toileting- Clothing Manipulation and Hygiene: Supervision/safety;Sit to/from stand Toileting - Clothing Manipulation Details (indicate cue type and reason): cues for posterior hip precautions     Functional mobility during ADLs: Rolling walker;Supervision/safety General ADL Comments: Educated pt concerning posterior hip precautions and impact on ADL as well as compensatory ADL strategies related to ADL and functional mobility. Educated concerning use of AE  for LB ADL but will need further reinforcement.      Vision Patient Visual Report: No change from  baseline Vision Assessment?: No apparent visual deficits     Perception     Praxis      Pertinent Vitals/Pain Pain Assessment: Faces Faces Pain Scale: Hurts little more Pain Location: L hip  Pain Descriptors / Indicators: Aching;Operative site guarding Pain Intervention(s): Limited activity within patient's tolerance;Monitored during session;Repositioned     Hand Dominance     Extremity/Trunk Assessment Upper Extremity Assessment Upper Extremity Assessment: Overall WFL for tasks assessed   Lower Extremity Assessment Lower Extremity Assessment: LLE deficits/detail LLE Deficits / Details: Decreased strength and ROM as expected post-operatively.    Cervical / Trunk Assessment Cervical / Trunk Assessment: Normal   Communication Communication Communication: No difficulties   Cognition Arousal/Alertness: Awake/alert Behavior During Therapy: WFL for tasks assessed/performed Overall Cognitive Status: Within Functional Limits for tasks assessed                                     General Comments       Exercises     Shoulder Instructions      Home Living Family/patient expects to be discharged to:: Private residence Living Arrangements: Alone Available Help at Discharge: Family;Available PRN/intermittently(lives with grandson) Type of Home: Apartment Home Access: Level entry     Home Layout: One level     Bathroom Shower/Tub: Teacher, early years/pre: Standard     Home Equipment: Tub bench;Adaptive equipment Adaptive Equipment: Reacher(but with suction cups on the end)        Prior Functioning/Environment Level of Independence: Independent                 OT Problem List: Decreased strength;Decreased range of motion;Decreased activity tolerance;Impaired balance (sitting and/or standing);Decreased safety awareness;Decreased knowledge of use of DME or AE;Decreased knowledge of precautions;Pain      OT Treatment/Interventions:  Self-care/ADL training;Therapeutic exercise;Energy conservation;DME and/or AE instruction;Therapeutic activities;Patient/family education;Balance training    OT Goals(Current goals can be found in the care plan section) Acute Rehab OT Goals Patient Stated Goal: to go home  OT Goal Formulation: With patient Time For Goal Achievement: 10/03/17 Potential to Achieve Goals: Good ADL Goals Pt Will Perform Grooming: with modified independence;standing Pt Will Perform Lower Body Dressing: with modified independence;sit to/from stand Pt Will Transfer to Toilet: with modified independence;ambulating;bedside commode(BSC over toilet) Pt Will Perform Toileting - Clothing Manipulation and hygiene: with modified independence;sit to/from stand Pt Will Perform Tub/Shower Transfer: Tub transfer;tub bench;rolling walker  OT Frequency: Min 2X/week   Barriers to D/C:            Co-evaluation              AM-PAC PT "6 Clicks" Daily Activity     Outcome Measure Help from another person eating meals?: None Help from another person taking care of personal grooming?: A Little Help from another person toileting, which includes using toliet, bedpan, or urinal?: A Little Help from another person bathing (including washing, rinsing, drying)?: A Little Help from another person to put on and taking off regular upper body clothing?: None Help from another person to put on and taking off regular lower body clothing?: A Little 6 Click Score: 20   End of Session Equipment Utilized During Treatment: Rolling walker Nurse Communication: Mobility status  Activity Tolerance: Patient tolerated treatment well Patient left: in  chair;with call bell/phone within reach  OT Visit Diagnosis: Other abnormalities of gait and mobility (R26.89);Pain Pain - Right/Left: Left Pain - part of body: Hip                Time: 1415-1435 OT Time Calculation (min): 20 min Charges:  OT General Charges $OT Visit: 1 Visit OT  Evaluation $OT Eval Moderate Complexity: 1 Mod G-Codes:     Norman Herrlich, MS OTR/L  Pager: [place] [facility] 09/19/2017, 4:53 PM

## 2017-09-19 NOTE — Discharge Instructions (Signed)
INSTRUCTIONS AFTER JOINT REPLACEMENT  ° °o Remove items at home which could result in a fall. This includes throw rugs or furniture in walking pathways °o ICE to the affected joint every three hours while awake for 30 minutes at a time, for at least the first 3-5 days, and then as needed for pain and swelling.  Continue to use ice for pain and swelling. You may notice swelling that will progress down to the foot and ankle.  This is normal after surgery.  Elevate your leg when you are not up walking on it.   °o Continue to use the breathing machine you got in the hospital (incentive spirometer) which will help keep your temperature down.  It is common for your temperature to cycle up and down following surgery, especially at night when you are not up moving around and exerting yourself.  The breathing machine keeps your lungs expanded and your temperature down. ° ° °DIET:  As you were doing prior to hospitalization, we recommend a well-balanced diet. ° °DRESSING / WOUND CARE / SHOWERING ° °You may change your dressing 3-5 days after surgery.  Then change the dressing every day with sterile gauze.  Please use good hand washing techniques before changing the dressing.  Do not use any lotions or creams on the incision until instructed by your surgeon. ° °ACTIVITY ° °o Increase activity slowly as tolerated, but follow the weight bearing instructions below.   °o No driving for 6 weeks or until further direction given by your physician.  You cannot drive while taking narcotics.  °o No lifting or carrying greater than 10 lbs. until further directed by your surgeon. °o Avoid periods of inactivity such as sitting longer than an hour when not asleep. This helps prevent blood clots.  °o You may return to work once you are authorized by your doctor.  ° ° ° °WEIGHT BEARING  ° °Weight bearing as tolerated with assist device (walker, cane, etc) as directed, use it as long as suggested by your surgeon or therapist, typically at  least 4-6 weeks. ° ° °EXERCISES ° °Results after joint replacement surgery are often greatly improved when you follow the exercise, range of motion and muscle strengthening exercises prescribed by your doctor. Safety measures are also important to protect the joint from further injury. Any time any of these exercises cause you to have increased pain or swelling, decrease what you are doing until you are comfortable again and then slowly increase them. If you have problems or questions, call your caregiver or physical therapist for advice.  ° °Rehabilitation is important following a joint replacement. After just a few days of immobilization, the muscles of the leg can become weakened and shrink (atrophy).  These exercises are designed to build up the tone and strength of the thigh and leg muscles and to improve motion. Often times heat used for twenty to thirty minutes before working out will loosen up your tissues and help with improving the range of motion but do not use heat for the first two weeks following surgery (sometimes heat can increase post-operative swelling).  ° °These exercises can be done on a training (exercise) mat, on the floor, on a table or on a bed. Use whatever works the best and is most comfortable for you.    Use music or television while you are exercising so that the exercises are a pleasant break in your day. This will make your life better with the exercises acting as a break   in your routine that you can look forward to.   Perform all exercises about fifteen times, three times per day or as directed.  You should exercise both the operative leg and the other leg as well. ° °Exercises include: °  °• Quad Sets - Tighten up the muscle on the front of the thigh (Quad) and hold for 5-10 seconds.   °• Straight Leg Raises - With your knee straight (if you were given a brace, keep it on), lift the leg to 60 degrees, hold for 3 seconds, and slowly lower the leg.  Perform this exercise against  resistance later as your leg gets stronger.  °• Leg Slides: Lying on your back, slowly slide your foot toward your buttocks, bending your knee up off the floor (only go as far as is comfortable). Then slowly slide your foot back down until your leg is flat on the floor again.  °• Angel Wings: Lying on your back spread your legs to the side as far apart as you can without causing discomfort.  °• Hamstring Strength:  Lying on your back, push your heel against the floor with your leg straight by tightening up the muscles of your buttocks.  Repeat, but this time bend your knee to a comfortable angle, and push your heel against the floor.  You may put a pillow under the heel to make it more comfortable if necessary.  ° °A rehabilitation program following joint replacement surgery can speed recovery and prevent re-injury in the future due to weakened muscles. Contact your doctor or a physical therapist for more information on knee rehabilitation.  ° ° °CONSTIPATION ° °Constipation is defined medically as fewer than three stools per week and severe constipation as less than one stool per week.  Even if you have a regular bowel pattern at home, your normal regimen is likely to be disrupted due to multiple reasons following surgery.  Combination of anesthesia, postoperative narcotics, change in appetite and fluid intake all can affect your bowels.  ° °YOU MUST use at least one of the following options; they are listed in order of increasing strength to get the job done.  They are all available over the counter, and you may need to use some, POSSIBLY even all of these options:   ° °Drink plenty of fluids (prune juice may be helpful) and high fiber foods °Colace 100 mg by mouth twice a day  °Senokot for constipation as directed and as needed Dulcolax (bisacodyl), take with full glass of water  °Miralax (polyethylene glycol) once or twice a day as needed. ° °If you have tried all these things and are unable to have a bowel  movement in the first 3-4 days after surgery call either your surgeon or your primary doctor.   ° °If you experience loose stools or diarrhea, hold the medications until you stool forms back up.  If your symptoms do not get better within 1 week or if they get worse, check with your doctor.  If you experience "the worst abdominal pain ever" or develop nausea or vomiting, please contact the office immediately for further recommendations for treatment. ° ° °ITCHING:  If you experience itching with your medications, try taking only a single pain pill, or even half a pain pill at a time.  You can also use Benadryl over the counter for itching or also to help with sleep.  ° °TED HOSE STOCKINGS:  Use stockings on both legs until for at least 2 weeks or as   directed by physician office. They may be removed at night for sleeping. ° °MEDICATIONS:  See your medication summary on the “After Visit Summary” that nursing will review with you.  You may have some home medications which will be placed on hold until you complete the course of blood thinner medication.  It is important for you to complete the blood thinner medication as prescribed. ° °PRECAUTIONS:  If you experience chest pain or shortness of breath - call 911 immediately for transfer to the hospital emergency department.  ° °If you develop a fever greater that 101 F, purulent drainage from wound, increased redness or drainage from wound, foul odor from the wound/dressing, or calf pain - CONTACT YOUR SURGEON.   °                                                °FOLLOW-UP APPOINTMENTS:  If you do not already have a post-op appointment, please call the office for an appointment to be seen by your surgeon.  Guidelines for how soon to be seen are listed in your “After Visit Summary”, but are typically between 1-4 weeks after surgery. ° °OTHER INSTRUCTIONS:  ° °Knee Replacement:  Do not place pillow under knee, focus on keeping the knee straight while resting. CPM  instructions: 0-90 degrees, 2 hours in the morning, 2 hours in the afternoon, and 2 hours in the evening. Place foam block, curve side up under heel at all times except when in CPM or when walking.  DO NOT modify, tear, cut, or change the foam block in any way. ° °MAKE SURE YOU:  °• Understand these instructions.  °• Get help right away if you are not doing well or get worse.  ° ° °Thank you for letting us be a part of your medical care team.  It is a privilege we respect greatly.  We hope these instructions will help you stay on track for a fast and full recovery!  ° °Information on my medicine - XARELTO® (Rivaroxaban) ° ° °Why was Xarelto® prescribed for you? °Xarelto® was prescribed for you to reduce the risk of blood clots forming after orthopedic surgery. The medical term for these abnormal blood clots is venous thromboembolism (VTE). ° °What do you need to know about xarelto® ? °Take your Xarelto® ONCE DAILY at the same time every day. °You may take it either with or without food. ° °If you have difficulty swallowing the tablet whole, you may crush it and mix in applesauce just prior to taking your dose. ° °Take Xarelto® exactly as prescribed by your doctor and DO NOT stop taking Xarelto® without talking to the doctor who prescribed the medication.  Stopping without other VTE prevention medication to take the place of Xarelto® may increase your risk of developing a clot. ° °After discharge, you should have regular check-up appointments with your healthcare provider that is prescribing your Xarelto®.   ° °What do you do if you miss a dose? °If you miss a dose, take it as soon as you remember on the same day then continue your regularly scheduled once daily regimen the next day. Do not take two doses of Xarelto® on the same day.  ° °Important Safety Information °A possible side effect of Xarelto® is bleeding. You should call your healthcare provider right away if you experience any of the following: °?   Bleeding  from an injury or your nose that does not stop. °? Unusual colored urine (red or dark brown) or unusual colored stools (red or black). °? Unusual bruising for unknown reasons. °? A serious fall or if you hit your head (even if there is no bleeding). ° °Some medicines may interact with Xarelto® and might increase your risk of bleeding while on Xarelto®. To help avoid this, consult your healthcare provider or pharmacist prior to using any new prescription or non-prescription medications, including herbals, vitamins, non-steroidal anti-inflammatory drugs (NSAIDs) and supplements. ° °This website has more information on Xarelto®: www.xarelto.com. ° ° °

## 2017-09-19 NOTE — Progress Notes (Signed)
Physical Therapy Treatment Patient Details Name: Seth TORRY Sr. MRN: [id number] DOB: July 12, 1940 Today's Date: 09/19/2017    History of Present Illness Pt is a 77 y/o male s/p elective L THA, posterior approach. PMH includes CAD, DM, DVT, HTN, OSA on CPAP, and back surgery.     PT Comments    Pt with increased gait speed this pm and required cues to slow down to improve safety.  Pt remains to recall 2/3 precautions pre tx, and 3/3 post tx.  Pt progressed to standing exercises.  Returned to supine with needs in reach post session.  Plan for stair training in am.     Follow Up Recommendations  Follow surgeon's recommendation for DC plan and follow-up therapies;Supervision for mobility/OOB     Equipment Recommendations  Rolling walker with 5" wheels;3in1 (PT)    Recommendations for Other Services       Precautions / Restrictions Precautions Precautions: Posterior Hip Precaution Booklet Issued: Yes (comment) Precaution Comments: Handout provided by PT. Reviewed posterior hip precautions and compensatory strategies related to ADL.  Restrictions Weight Bearing Restrictions: Yes LLE Weight Bearing: Weight bearing as tolerated    Mobility  Bed Mobility Overal bed mobility: Needs Assistance Bed Mobility: Supine to Sit;Sit to Supine       Sit to supine: Supervision   General bed mobility comments: Supervision for back to bed training.    Transfers Overall transfer level: Needs assistance Equipment used: Rolling walker (2 wheeled) Transfers: Sit to/from Stand Sit to Stand: Supervision         General transfer comment: Pt performed with cues for forward advancement of LLE to maintain posterior hip precautions.    Ambulation/Gait Ambulation/Gait assistance: Min guard Ambulation Distance (Feet): 350 Feet Assistive device: Rolling walker (2 wheeled) Gait Pattern/deviations: Step-through pattern;Antalgic;Trunk flexed Gait velocity: Increased almost speed walking.      General Gait Details: Cues to slow stride to improve symmetry and reduce antalgic pattern.  Pt educated on signs of too much.     Stairs             Wheelchair Mobility    Modified Rankin (Stroke Patients Only)       Balance Overall balance assessment: Needs assistance Sitting-balance support: No upper extremity supported;Feet supported Sitting balance-Leahy Scale: Good     Standing balance support: Bilateral upper extremity supported;No upper extremity supported;During functional activity Standing balance-Leahy Scale: Fair Standing balance comment: Able to stand at sink for grooming tasks without UE support.                             Cognition Arousal/Alertness: Awake/alert Behavior During Therapy: WFL for tasks assessed/performed Overall Cognitive Status: Within Functional Limits for tasks assessed                                        Exercises Total Joint Exercises Hip ABduction/ADduction: AROM;Left;10 reps;Standing Knee Flexion: AROM;Left;10 reps;Standing Marching in Standing: AROM;Left;10 reps;Standing Standing Hip Extension: AROM;Left;10 reps;Standing    General Comments        Pertinent Vitals/Pain Pain Assessment: No/denies pain Pain Score: 2  Faces Pain Scale: Hurts little more Pain Location: L hip  Pain Descriptors / Indicators: Aching;Operative site guarding Pain Intervention(s): Monitored during session;Repositioned;Ice applied    Home Living Family/patient expects to be discharged to:: Private residence Living Arrangements: Alone Available Help at Discharge: Family;Available  PRN/intermittently(lives with grandson) Type of Home: Apartment Home Access: Level entry   Home Layout: One level Home Equipment: Tub bench;Adaptive equipment      Prior Function Level of Independence: Independent          PT Goals (current goals can now be found in the care plan section) Acute Rehab PT Goals Patient Stated Goal:  to go home  Potential to Achieve Goals: Good Progress towards PT goals: Progressing toward goals    Frequency    7X/week      PT Plan Current plan remains appropriate    Co-evaluation              AM-PAC PT "6 Clicks" Daily Activity  Outcome Measure  Difficulty turning over in bed (including adjusting bedclothes, sheets and blankets)?: A Little Difficulty moving from lying on back to sitting on the side of the bed? : Unable Difficulty sitting down on and standing up from a chair with arms (e.g., wheelchair, bedside commode, etc,.)?: Unable Help needed moving to and from a bed to chair (including a wheelchair)?: A Little Help needed walking in hospital room?: A Little Help needed climbing 3-5 steps with a railing? : A Little 6 Click Score: 14    End of Session Equipment Utilized During Treatment: Gait belt Activity Tolerance: Patient limited by pain Patient left: in bed;with call bell/phone within reach;with family/visitor present Nurse Communication: Mobility status PT Visit Diagnosis: Other abnormalities of gait and mobility (R26.89);Pain Pain - Right/Left: Left Pain - part of body: Hip     Time: 6948-5462 PT Time Calculation (min) (ACUTE ONLY): 16 min  Charges:  $Therapeutic Activity: 8-22 mins                    G Codes:       Governor Rooks, PTA pager [phone]    Cristela Blue 09/19/2017, 5:23 PM

## 2017-09-19 NOTE — Progress Notes (Addendum)
Physical Therapy Treatment Patient Details Name: Seth SCHLIEP Sr. MRN: [id number] DOB: 08-17-1940 Today's Date: 09/19/2017    History of Present Illness Pt is a 77 y/o male s/p elective L THA, posterior approach. PMH includes CAD, DM, DVT, HTN, OSA on CPAP, and back surgery.     PT Comments    Pt performed gait training and functional mobility.  Able to recall 2/3 posterior hip precautions and required cues throughout session to maintain precautions.  Pt performed increased gait during session and reviewed supine HEP.  Will f/u in pm to progress mobility and exercise program.  Pt continues to demonstrate progression to therapy goals.     Follow Up Recommendations  Follow surgeon's recommendation for DC plan and follow-up therapies;Supervision for mobility/OOB     Equipment Recommendations  Rolling walker with 5" wheels;3in1 (PT)    Recommendations for Other Services       Precautions / Restrictions Precautions Precautions: Posterior Hip Precaution Booklet Issued: Yes (comment) Precaution Comments: Reviewed posterior hip precautions and supine HEP.  Restrictions Weight Bearing Restrictions: Yes LLE Weight Bearing: Weight bearing as tolerated    Mobility  Bed Mobility Overal bed mobility: Needs Assistance Bed Mobility: Supine to Sit;Sit to Supine     Supine to sit: Supervision     General bed mobility comments: Cues for LE advancement to maintain posterior hip precautions.  Pt requirec cues to avoid IR with advancement to edge of bed.    Transfers Overall transfer level: Needs assistance Equipment used: Rolling walker (2 wheeled) Transfers: Sit to/from Stand Sit to Stand: Min assist;From elevated surface         General transfer comment: Pt required cues for hand placement and advancement forward of LLE to avoid excessive flexion of L hip.    Ambulation/Gait Ambulation/Gait assistance: Min guard Ambulation Distance (Feet): 160 Feet Assistive device: Rolling  walker (2 wheeled) Gait Pattern/deviations: Decreased step length - right;Decreased step length - left;Decreased weight shift to left;Antalgic;Step-through pattern Gait velocity: Decreased    General Gait Details: Cues for Increasing R stride length to promote gait symmetry with step through pattern.  Pt tolerated gait advancement well.  Cues for RW safety.     Stairs             Wheelchair Mobility    Modified Rankin (Stroke Patients Only)       Balance Overall balance assessment: Needs assistance   Sitting balance-Leahy Scale: Good       Standing balance-Leahy Scale: Poor Standing balance comment: Reliant on BUE support                             Cognition Arousal/Alertness: Awake/alert Behavior During Therapy: WFL for tasks assessed/performed Overall Cognitive Status: Within Functional Limits for tasks assessed                                        Exercises  L: QS, heel slide, hip abduction, SAQ, and LAQ 1x10 reps B APs 1x20 reps.     General Comments        Pertinent Vitals/Pain Pain Assessment: 0-10 Pain Score: 2  Pain Location: L hip  Pain Descriptors / Indicators: Aching;Operative site guarding Pain Intervention(s): Monitored during session;Repositioned;Ice applied    Home Living  Prior Function            PT Goals (current goals can now be found in the care plan section) Acute Rehab PT Goals Patient Stated Goal: to go home  Potential to Achieve Goals: Good Progress towards PT goals: Progressing toward goals    Frequency    7X/week      PT Plan Current plan remains appropriate    Co-evaluation              AM-PAC PT "6 Clicks" Daily Activity  Outcome Measure  Difficulty turning over in bed (including adjusting bedclothes, sheets and blankets)?: A Little Difficulty moving from lying on back to sitting on the side of the bed? : Unable Difficulty sitting down on and  standing up from a chair with arms (e.g., wheelchair, bedside commode, etc,.)?: Unable Help needed moving to and from a bed to chair (including a wheelchair)?: A Little Help needed walking in hospital room?: A Little Help needed climbing 3-5 steps with a railing? : A Little 6 Click Score: 14    End of Session Equipment Utilized During Treatment: Gait belt Activity Tolerance: Patient limited by pain Patient left: in bed;with call bell/phone within reach;with family/visitor present Nurse Communication: Mobility status PT Visit Diagnosis: Other abnormalities of gait and mobility (R26.89);Pain Pain - Right/Left: Left Pain - part of body: Hip     Time: 1124-1150 PT Time Calculation (min) (ACUTE ONLY): 26 min  Charges:  $Gait Training: 8-22 mins $Therapeutic Exercise: 8-22 mins                    G Codes:       Seth Mcintosh, PTA pager [phone]    Seth Mcintosh 09/19/2017, 11:55 AM

## 2017-09-19 NOTE — Care Plan (Signed)
Patient is planning to discharge to home with family and HHPT provided by [facility]. Rolling walker and 3n1 to be delivered to his room prior to discharge by [facility]. He will follow up in the office with Dr. Mardelle Matte on 10/01/17 @ 1145 and transition to OPPT at [facility] in [place] on 10/02/17 @ 1100.  He is agreeable to above plan.   Please contact [place], [place] with questions or if this plan should need to change.   Thanks

## 2017-09-20 LAB — GLUCOSE, CAPILLARY
GLUCOSE-CAPILLARY: 143 mg/dL — AB (ref 65–99)
Glucose-Capillary: 158 mg/dL — ABNORMAL HIGH (ref 65–99)
Glucose-Capillary: 157 mg/dL — ABNORMAL HIGH (ref 65–99)
Glucose-Capillary: 165 mg/dL — ABNORMAL HIGH (ref 65–99)

## 2017-09-20 LAB — CBC
HEMATOCRIT: 33.1 % — AB (ref 39.0–52.0)
HEMOGLOBIN: 11.3 g/dL — AB (ref 13.0–17.0)
MCH: 31.3 pg (ref 26.0–34.0)
MCHC: 34.1 g/dL (ref 30.0–36.0)
MCV: 91.7 fL (ref 78.0–100.0)
Platelets: 198 10*3/uL (ref 150–400)
RBC: 3.61 MIL/uL — ABNORMAL LOW (ref 4.22–5.81)
RDW: 11.7 % (ref 11.5–15.5)
WBC: 13.3 10*3/uL — ABNORMAL HIGH (ref 4.0–10.5)

## 2017-09-20 LAB — BASIC METABOLIC PANEL
Anion gap: 10 (ref 5–15)
BUN: 15 mg/dL (ref 6–20)
CO2: 23 mmol/L (ref 22–32)
CREATININE: 1.04 mg/dL (ref 0.61–1.24)
Calcium: 8.6 mg/dL — ABNORMAL LOW (ref 8.9–10.3)
Chloride: 105 mmol/L (ref 101–111)
GFR calc non Af Amer: >60 mL/min (ref >60)
Glucose, Bld: 166 mg/dL — ABNORMAL HIGH (ref 65–99)
Potassium: 4.6 mmol/L (ref 3.5–5.1)
Sodium: 138 mmol/L (ref 135–145)

## 2017-09-20 NOTE — Progress Notes (Signed)
Physical Therapy Treatment Patient Details Name: Seth Mcintosh. MRN: [id number] DOB: 1940/12/23 Today's Date: 09/20/2017    History of Present Illness Pt is a 77 y/o male s/p elective L THA, posterior approach. PMH includes CAD, DM, DVT, HTN, OSA on CPAP, and back surgery.     PT Comments    Pt presents with increased pain on arrival with complaints of L calf pain.  Informed nursing of c/o and patient medicated during session.  Pt remains to require education on signs of too much.  Pt required cues for slowing pace and limiting activity/  Pt reports, " I think I can get up and do more."  Pt urge to rest and will f/u for pm session before d/c home.    Follow Up Recommendations  Follow surgeon's recommendation for DC plan and follow-up therapies;Supervision for mobility/OOB     Equipment Recommendations  Rolling walker with 5" wheels;3in1 (PT)    Recommendations for Other Services OT consult     Precautions / Restrictions Precautions Precautions: Posterior Hip Precaution Booklet Issued: Yes (comment) Precaution Comments: Reviewed posterior Hip precautions and patient able to recall 3/3 this am session.   Restrictions Weight Bearing Restrictions: Yes LLE Weight Bearing: Weight bearing as tolerated    Mobility  Bed Mobility               General bed mobility comments: Pt sitting in recliner on arrival.    Transfers Overall transfer level: Needs assistance Equipment used: Rolling walker (2 wheeled) Transfers: Sit to/from Stand Sit to Stand: Supervision         General transfer comment: Cues for hand placement and LLE advancement forward to achieve standing.    Ambulation/Gait Ambulation/Gait assistance: Min guard Ambulation Distance (Feet): 200 Feet Assistive device: Rolling walker (2 wheeled) Gait Pattern/deviations: Step-to pattern;Step-through pattern;Antalgic;Trunk flexed;Decreased stance time - left Gait velocity: decreased.    General Gait Details:  Cues for increase in R stride length to promote gait symmetry.  Pt with significant decrease stance time on L due to pain.     Stairs             Wheelchair Mobility    Modified Rankin (Stroke Patients Only)       Balance Overall balance assessment: Needs assistance   Sitting balance-Leahy Scale: Good       Standing balance-Leahy Scale: Fair Standing balance comment: Able to stand at sink for grooming tasks without UE support.                             Cognition Arousal/Alertness: Awake/alert Behavior During Therapy: WFL for tasks assessed/performed Overall Cognitive Status: Within Functional Limits for tasks assessed                                        Exercises Total Joint Exercises Ankle Circles/Pumps: AROM;Both;20 reps Quad Sets: AROM;10 reps;Left;Supine Heel Slides: Left;10 reps;Supine;AAROM Hip ABduction/ADduction: AAROM;Left;10 reps;Supine Other Exercises Other Exercises: HC/HS stretch 4x20 sec to reduce tightness in L calf/HS.      General Comments        Pertinent Vitals/Pain Pain Assessment: No/denies pain Pain Score: 8  Pain Location: L hip (incisional)  and calf pain.   Pain Descriptors / Indicators: Aching;Operative site guarding Pain Intervention(s): Monitored during session;Repositioned;Ice applied    Home Living  Prior Function            PT Goals (current goals can now be found in the care plan section) Acute Rehab PT Goals Patient Stated Goal: to go home  Potential to Achieve Goals: Good Progress towards PT goals: Progressing toward goals    Frequency    7X/week      PT Plan Current plan remains appropriate    Co-evaluation              AM-PAC PT "6 Clicks" Daily Activity  Outcome Measure  Difficulty turning over in bed (including adjusting bedclothes, sheets and blankets)?: A Little Difficulty moving from lying on back to sitting on the side of the  bed? : Unable Difficulty sitting down on and standing up from a chair with arms (e.g., wheelchair, bedside commode, etc,.)?: Unable Help needed moving to and from a bed to chair (including a wheelchair)?: A Little Help needed walking in hospital room?: A Little Help needed climbing 3-5 steps with a railing? : A Little 6 Click Score: 14    End of Session Equipment Utilized During Treatment: Gait belt Activity Tolerance: Patient limited by pain Patient left: in bed;with call bell/phone within reach;with family/visitor present Nurse Communication: Mobility status PT Visit Diagnosis: Other abnormalities of gait and mobility (R26.89);Pain Pain - Right/Left: Left Pain - part of body: Hip     Time: 1157-2620 PT Time Calculation (min) (ACUTE ONLY): 23 min  Charges:  $Gait Training: 8-22 mins $Therapeutic Exercise: 8-22 mins                    G Codes:       Governor Rooks, PTA pager [phone]    Cristela Blue 09/20/2017, 11:16 AM

## 2017-09-20 NOTE — Plan of Care (Signed)
  Patient received pain meds at 1059 today.  Problem: Pain Managment: Goal: General experience of comfort will improve Outcome: Progressing

## 2017-09-20 NOTE — Progress Notes (Signed)
1059- Pt complaining of lower left leg pain.  Pt given 2 tablets of Norco at 1059.     Pt still complaining of pain to his lower left leg.  Pt's lower leg assessed.  Pt's leg is warm, pulses and pt's skin color are within normal limits. Pt stated he has a history of a DVT in that leg.  Dr. Mardelle Matte notified of pt's pain.  Ultrasound of left leg ordered.  Will continue to monitor.

## 2017-09-20 NOTE — Progress Notes (Signed)
Occupational Therapy Treatment Patient Details Name: Seth KUJAWA Sr. MRN: [id number] DOB: 1940-05-09 Today's Date: 09/20/2017    History of present illness Pt is a 77 y/o male s/p elective L THA, posterior approach. PMH includes CAD, DM, DVT, HTN, OSA on CPAP, and back surgery.    OT comments  This 77 yo male admitted and underwent above presents to acute OT with pain in left shin area (that is new from yesterday)--RN made aware. Pt able to move around at S level for transfers and says is aware of how to use AE for LBD as well as he was able to verbalize to me his posterior hip precautions. He will continue to benefit from acute OT as long as he is here.  Follow Up Recommendations  No OT follow up;Supervision/Assistance - 24 hour    Equipment Recommendations  3 in 1 bedside commode       Precautions / Restrictions Precautions Precautions: Posterior Hip Precaution Booklet Issued: Yes (comment) Precaution Comments: Reviewed posterior Hip precautions and patient able to recall 3/3 this am session.   Restrictions Weight Bearing Restrictions: No LLE Weight Bearing: Weight bearing as tolerated       Mobility Bed Mobility Overal bed mobility: Needs Assistance Bed Mobility: Sit to Supine       Sit to supine: Supervision   General bed mobility comments: pt using momentum to get LLE into bed  Transfers Overall transfer level: Needs assistance Equipment used: Rolling walker (2 wheeled) Transfers: Sit to/from Stand Sit to Stand: Supervision           Balance Overall balance assessment: Needs assistance Sitting-balance support: No upper extremity supported;Feet supported Sitting balance-Leahy Scale: Good     Standing balance support: Bilateral upper extremity supported;No upper extremity supported;During functional activity Standing balance-Leahy Scale: Fair                            ADL either performed or assessed with clinical judgement   ADL Overall  ADL's : Needs assistance/impaired                         Toilet Transfer: Supervision/safety;Ambulation;RW Toilet Transfer Details (indicate cue type and reason): recliner around to other side of bed           General ADL Comments: Pt reports he is completely aware of how to use AE from yesterday's OT session. Did not do tub transfer at this time due to pt's left LE pain at front of shin. Pt has tub transfer bench at home that he has currently been using so this is not new to him--only new part is posterior hip precautions     Vision Patient Visual Report: No change from baseline            Cognition Arousal/Alertness: Awake/alert Behavior During Therapy: WFL for tasks assessed/performed Overall Cognitive Status: Within Functional Limits for tasks assessed                                                     Pertinent Vitals/ Pain       Pain Assessment: 0-10 Pain Score: 7  Pain Location: LLE to left of shin pain Pain Descriptors / Indicators: Sore;Guarding Pain Intervention(s): Monitored during session;Repositioned;Ice applied(ice to hip and shin)  Frequency  Min 2X/week        Progress Toward Goals  OT Goals(current goals can now be found in the care plan section)  Progress towards OT goals: Progressing toward goals  Acute Rehab OT Goals Patient Stated Goal: to go home   Plan Discharge plan remains appropriate       AM-PAC PT "6 Clicks" Daily Activity     Outcome Measure   Help from another person eating meals?: None Help from another person taking care of personal grooming?: A Little Help from another person toileting, which includes using toliet, bedpan, or urinal?: A Little Help from another person bathing (including washing, rinsing, drying)?: A Little Help from another person to put on and taking off regular upper body clothing?: A Little Help from another person to put on and taking off regular lower body  clothing?: A Little 6 Click Score: 19    End of Session Equipment Utilized During Treatment: Rolling walker  OT Visit Diagnosis: Other abnormalities of gait and mobility (R26.89);Pain Pain - Right/Left: Left Pain - part of body: (shin)   Activity Tolerance Patient limited by pain   Patient Left in bed;with call bell/phone within reach;with bed alarm set   Nurse Communication (pt still c/o of LLE front shin pain and reports that he has a history of DVT in this place before when he was in service)        Time: 2671-2458 OT Time Calculation (min): 11 min  Charges: OT General Charges $OT Visit: 1 Visit OT Treatments $Self Care/Home Management : 8-22 mins  [place], OTR/L [phone] 09/20/2017

## 2017-09-20 NOTE — Discharge Summary (Addendum)
Physician Discharge Summary  Patient ID: Seth LINNEMANN Sr. MRN: [id number] DOB/AGE: 29-Oct-1940 77 y.o.  Admit date: 09/18/2017 Discharge date: 09/21/2017  Admission Diagnoses:  Primary localized osteoarthritis of left hip  Discharge Diagnoses:  Principal Problem:   Primary localized osteoarthritis of left hip   Past Medical History:  Diagnosis Date  . Arthritis    rheumatoid   . Coronary artery disease   . Depression   . Diabetes mellitus without complication ([place])   . Diverticulosis   . DVT (deep venous thrombosis) ([place])    RIGHT LEG  WHILE IN MILITARY   . GERD (gastroesophageal reflux disease)   . Hypertension   . Leg lesion    RIGHT WOUND CARE  . Localized osteoarthritis of right knee 05/21/2012  . Primary localized osteoarthritis of left hip 09/18/2017  . Reflux   . Sleep apnea    CPAP  . Sleeping difficulty    takes med nightly  . Urgency of urination     Surgeries: Procedure(s): LEFT TOTAL HIP ARTHROPLASTY on 09/18/2017   Consultants (if any):   Discharged Condition: Improved  Hospital Course: Seth SCHNEPF Sr. is an 77 y.o. male who was admitted 09/18/2017 with a diagnosis of Primary localized osteoarthritis of left hip and went to the operating room on 09/18/2017 and underwent the above named procedures.    He was given perioperative antibiotics:  Anti-infectives (From admission, onward)   Start     Dose/Rate Route Frequency Ordered Stop   09/18/17 1800  ceFAZolin (ANCEF) IVPB 2g/100 mL premix     2 g 200 mL/hr over 30 Minutes Intravenous Every 6 hours 09/18/17 1354 09/19/17 0016   09/18/17 0800  ceFAZolin (ANCEF) 3 g in dextrose 5 % 50 mL IVPB     3 g 100 mL/hr over 30 Minutes Intravenous To Short Stay 09/14/17 0747 09/18/17 1212    .  He was given sequential compression devices, early ambulation, and xarelto for DVT prophylaxis.  He did have calf pain on postoperative day 2, that was concerning for DVT, and ultrasound was ordered and the preliminary  result is negative, his discharge was held for an additional night.  He has a past history for DVT.  He benefited maximally from the hospital stay and there were no complications.    Recent vital signs:  Vitals:   09/20/17 2022 09/21/17 0518  BP: (!) 150/73 (!) 153/75  Pulse: 77 73  Resp: 18 16  Temp: 98.6 F (37 C) 98.1 F (36.7 C)  SpO2: 99% 96%    Recent laboratory studies:  Lab Results  Component Value Date   HGB 11.9 (L) 09/21/2017   HGB 11.3 (L) 09/20/2017   HGB 10.8 (L) 09/19/2017   Lab Results  Component Value Date   WBC 10.8 (H) 09/21/2017   PLT 198 09/21/2017   Lab Results  Component Value Date   INR 1.70 (H) 05/24/2012   Lab Results  Component Value Date   NA 138 09/20/2017   K 4.6 09/20/2017   CL 105 09/20/2017   CO2 23 09/20/2017   BUN 15 09/20/2017   CREATININE 1.04 09/20/2017   GLUCOSE 166 (H) 09/20/2017    Discharge Medications:   Allergies as of 09/21/2017   No Known Allergies     Medication List    STOP taking these medications   aspirin EC 81 MG tablet     TAKE these medications   azelastine 0.1 % nasal spray Commonly known as:  ASTELIN Place 1 spray  into both nostrils 2 (two) times daily. Use in each nostril as directed   baclofen 10 MG tablet Commonly known as:  LIORESAL Take 1 tablet (10 mg total) by mouth 3 (three) times daily. As needed for muscle spasm   buPROPion 300 MG 24 hr tablet Commonly known as:  WELLBUTRIN XL TAKE 1 TABLET DAILY   busPIRone 15 MG tablet Commonly known as:  BUSPAR Take 1 tablet (15 mg total) by mouth 2 (two) times daily.   CAL-MAG PO Take 1 tablet by mouth daily.   clonazePAM 0.5 MG tablet Commonly known as:  KLONOPIN Take 1 tablet (0.5 mg total) by mouth 2 (two) times daily as needed. anxiety What changed:    when to take this  additional instructions   Co Q 10 100 MG Caps Take 100 mg by mouth daily.   doxylamine (Sleep) 25 MG tablet Commonly known as:  UNISOM Take 25 mg by mouth  at bedtime.   ezetimibe-simvastatin 10-20 MG tablet Commonly known as:  VYTORIN TAKE 1 TABLET DAILY AT 6 P.M   fish oil-omega-3 fatty acids 1000 MG capsule Take 1 g by mouth daily.   fluticasone 50 MCG/ACT nasal spray Commonly known as:  FLONASE Place 2 sprays into both nostrils daily. What changed:  when to take this   HYDROcodone-acetaminophen 10-325 MG tablet Commonly known as:  NORCO Take 1 tablet by mouth every 6 (six) hours as needed.   irbesartan 150 MG tablet Commonly known as:  AVAPRO TAKE 1 TABLET DAILY   metFORMIN 1000 MG tablet Commonly known as:  GLUCOPHAGE Take 1 tablet (1,000 mg total) by mouth 2 (two) times daily with a meal.   multivitamin with minerals Tabs tablet Take 1 tablet by mouth daily.   ondansetron 4 MG tablet Commonly known as:  ZOFRAN Take 1 tablet (4 mg total) by mouth every 8 (eight) hours as needed for nausea or vomiting.   pantoprazole 40 MG tablet Commonly known as:  PROTONIX Take 1 tablet (40 mg total) by mouth daily.   rivaroxaban 10 MG Tabs tablet Commonly known as:  XARELTO Take 1 tablet (10 mg total) by mouth daily.   sennosides-docusate sodium 8.6-50 MG tablet Commonly known as:  SENOKOT-S Take 2 tablets by mouth daily.   sildenafil 50 MG tablet Commonly known as:  VIAGRA Take 1 tablet (50 mg total) by mouth daily as needed for erectile dysfunction. 1 tablet po 30 mins prior to sexual activity   solifenacin 10 MG tablet Commonly known as:  VESICARE Take 1 tablet (10 mg total) by mouth daily.   tamsulosin 0.4 MG Caps capsule Commonly known as:  FLOMAX Take 0.4 mg by mouth daily.   traMADol 50 MG tablet Commonly known as:  ULTRAM One to Two Tabs Every Six Hours As Needed For Pain   TURMERIC PO Take 1,000 mg by mouth daily at 12 noon.   vitamin C 1000 MG tablet Take 1,000 mg by mouth daily.   Vitamin D3 5000 units Caps Take 5,000 Units by mouth daily.       Diagnostic Studies: Dg Hip Port Unilat With Pelvis  1v Left  Result Date: 09/18/2017 CLINICAL DATA:  77 year old male status post left total hip replacement. EXAM: DG HIP (WITH OR WITHOUT PELVIS) 1V PORT LEFT COMPARISON:  None. FINDINGS: Portable AP supine and cross-table lateral views of the pelvis and left hip at 1411 hours. Left bipolar type total hip arthroplasty appears normally aligned. No adverse hardware features. Surrounding postoperative soft tissue changes  including soft tissue gas. No unexpected osseous changes. Superimposed penile implant. Left greater than right femoral artery calcified atherosclerosis. IMPRESSION: Left total hip arthroplasty with no adverse features. Electronically Signed   By: Genevie Ann M.D.   On: 09/18/2017 14:37    Disposition: Discharge disposition: 01-Home or Self Care         Follow-up Information    [name], MD. Schedule an appointment as soon as possible for a visit in 2 weeks.   Specialty:  Orthopedic Surgery Contact information: [place] [address] [phone]            Signed: Johnny Bridge 09/21/2017, 1:49 PM

## 2017-09-20 NOTE — Progress Notes (Addendum)
Patient ID: Seth STATLER Sr., male   DOB: 06-Feb-1941, 77 y.o.   MRN: [id number]     Subjective:  Patient reports pain as mild.  Patient in bed and in no acute distress.  Denies any CP or SOB Patient would like to go home today but he would like PT twice today.  Objective:   VITALS:   Vitals:   09/19/17 0527 09/19/17 1323 09/19/17 1947 09/20/17 0406  BP: 112/61 118/75 123/61 117/65  Pulse: 67 70 72 65  Resp: 14 16  17   Temp: 98 F (36.7 C) 97.8 F (36.6 C) 98.6 F (37 C) 98 F (36.7 C)  TempSrc: Oral Oral Oral Oral  SpO2: 96% 98% 94% 94%  Weight:      Height:        ABD soft Sensation intact distally Dorsiflexion/Plantar flexion intact Incision: dressing C/D/I and no drainage Wound good  Lab Results  Component Value Date   WBC 13.3 (H) 09/20/2017   HGB 11.3 (L) 09/20/2017   HCT 33.1 (L) 09/20/2017   MCV 91.7 09/20/2017   PLT 198 09/20/2017   BMET    Component Value Date/Time   NA 138 09/20/2017 0403   NA 140 11/11/2014 1030   NA 139 11/28/2011 1157   K 4.6 09/20/2017 0403   K 4.1 11/28/2011 1157   CL 105 09/20/2017 0403   CL 106 11/28/2011 1157   CO2 23 09/20/2017 0403   CO2 25 11/28/2011 1157   GLUCOSE 166 (H) 09/20/2017 0403   GLUCOSE 132 (H) 11/28/2011 1157   BUN 15 09/20/2017 0403   BUN 23 11/11/2014 1030   BUN 27 (H) 11/28/2011 1157   CREATININE 1.04 09/20/2017 0403   CREATININE 1.13 11/28/2011 1157   CALCIUM 8.6 (L) 09/20/2017 0403   CALCIUM 8.9 11/28/2011 1157   GFRNONAA >60 09/20/2017 0403   GFRNONAA >60 11/28/2011 1157   GFRAA >60 09/20/2017 0403   GFRAA >60 11/28/2011 1157     Assessment/Plan: 2 Days Post-Op   Principal Problem:   Primary localized osteoarthritis of left hip   Advance diet Up with therapy Discharge home with home health in the PM after the afternoon session of PT Dry dressing PRN WBAT Follow up with Dr Mardelle Matte in 2 weeks    Remonia Richter 09/20/2017, 6:47 AM  Discussed and agree with  above.  [name], MD Cell [phone]

## 2017-09-20 NOTE — Anesthesia Postprocedure Evaluation (Signed)
Anesthesia Post Note  Patient: Seth SMEAD Sr.  Procedure(s) Performed: LEFT TOTAL HIP ARTHROPLASTY (Left Hip)     Patient location during evaluation: PACU Anesthesia Type: General Level of consciousness: awake and alert Pain management: pain level controlled Vital Signs Assessment: post-procedure vital signs reviewed and stable Respiratory status: spontaneous breathing, nonlabored ventilation, respiratory function stable and patient connected to nasal cannula oxygen Cardiovascular status: blood pressure returned to baseline and stable Postop Assessment: no apparent nausea or vomiting Anesthetic complications: no    Last Vitals:  Vitals:   09/20/17 0406 09/20/17 1455  BP: 117/65 115/67  Pulse: 65 79  Resp: 17 16  Temp: 36.7 C 36.7 C  SpO2: 94% 94%    Last Pain:  Vitals:   09/20/17 1455  TempSrc: Oral  PainSc:                  [name]

## 2017-09-20 NOTE — Progress Notes (Signed)
Physical Therapy Treatment Patient Details Name: Seth MORIMOTO Sr. MRN: [id number] DOB: 1940-07-03 Today's Date: 09/20/2017    History of Present Illness Pt is a 77 y/o male s/p elective L THA, posterior approach. PMH includes CAD, DM, DVT, HTN, OSA on CPAP, and back surgery.     PT Comments    Pt performed increased activity with functional mobility during session.  Pt tolerated session well but remains to complain of pain in LLE.  Pt transferred back to bed post session as he awaits a doppler study of his LLE.  Pt is safe to d/c home from a mobility standpoint.     Follow Up Recommendations  Follow surgeon's recommendation for DC plan and follow-up therapies;Supervision for mobility/OOB     Equipment Recommendations  Rolling walker with 5" wheels;3in1 (PT)    Recommendations for Other Services OT consult     Precautions / Restrictions Precautions Precautions: Posterior Hip Precaution Booklet Issued: Yes (comment) Precaution Comments: Reviewed posterior Hip precautions and patient able to recall 3/3 this am session.   Restrictions Weight Bearing Restrictions: Yes LLE Weight Bearing: Weight bearing as tolerated    Mobility  Bed Mobility Overal bed mobility: Needs Assistance Bed Mobility: Sit to Supine     Supine to sit: Supervision Sit to supine: Supervision   General bed mobility comments: pt using momentum to get LLE into bed  Transfers Overall transfer level: Needs assistance Equipment used: Rolling walker (2 wheeled) Transfers: Sit to/from Stand Sit to Stand: Supervision         General transfer comment: Cues for hand placement and LLE advancement forward to achieve standing.  Pt performed multiple transfers.   Ambulation/Gait Ambulation/Gait assistance: Min guard Ambulation Distance (Feet): 200 Feet(x2 trials, seated rest break between trials.  ) Assistive device: Rolling walker (2 wheeled) Gait Pattern/deviations: Step-to pattern;Step-through  pattern;Antalgic;Trunk flexed;Decreased stance time - left Gait velocity: decreased.    General Gait Details: Cues for increase in R stride length to promote gait symmetry.  Pt with significant decrease stance time on L due to pain.     Stairs             Wheelchair Mobility    Modified Rankin (Stroke Patients Only)       Balance Overall balance assessment: Needs assistance Sitting-balance support: No upper extremity supported;Feet supported Sitting balance-Leahy Scale: Good     Standing balance support: Bilateral upper extremity supported;No upper extremity supported;During functional activity Standing balance-Leahy Scale: Fair Standing balance comment: Able to stand at sink for grooming tasks without UE support.                             Cognition Arousal/Alertness: Awake/alert Behavior During Therapy: WFL for tasks assessed/performed Overall Cognitive Status: Within Functional Limits for tasks assessed                                        Exercises Total Joint Exercises Ankle Circles/Pumps: AROM;Both;20 reps Quad Sets: AROM;10 reps;Left;Supine Heel Slides: Left;10 reps;Supine;AAROM Hip ABduction/ADduction: Left;10 reps;AROM;Standing Long Arc Quad: AROM;Left;10 reps;Supine Knee Flexion: AROM;Left;10 reps;Standing Marching in Standing: AROM;Left;10 reps;Standing Standing Hip Extension: AROM;Left;10 reps;Standing Other Exercises Other Exercises: chair push-ups 1x10 reps.      General Comments        Pertinent Vitals/Pain Pain Assessment: 0-10 Pain Score: 8  Pain Location: L shin pain, thigh pain  and hip pain.  Shin remains warm to touch.   Pain Descriptors / Indicators: Sore;Guarding Pain Intervention(s): Monitored during session;Repositioned;Ice applied    Home Living                      Prior Function            PT Goals (current goals can now be found in the care plan section) Acute Rehab PT  Goals Patient Stated Goal: to go home  Potential to Achieve Goals: Good Progress towards PT goals: Progressing toward goals    Frequency    7X/week      PT Plan Current plan remains appropriate    Co-evaluation              AM-PAC PT "6 Clicks" Daily Activity  Outcome Measure  Difficulty turning over in bed (including adjusting bedclothes, sheets and blankets)?: A Little Difficulty moving from lying on back to sitting on the side of the bed? : Unable Difficulty sitting down on and standing up from a chair with arms (e.g., wheelchair, bedside commode, etc,.)?: Unable Help needed moving to and from a bed to chair (including a wheelchair)?: A Little Help needed walking in hospital room?: A Little Help needed climbing 3-5 steps with a railing? : A Little 6 Click Score: 14    End of Session Equipment Utilized During Treatment: Gait belt Activity Tolerance: Patient limited by pain Patient left: in bed;with call bell/phone within reach;with family/visitor present Nurse Communication: Mobility status PT Visit Diagnosis: Other abnormalities of gait and mobility (R26.89);Pain Pain - Right/Left: Left Pain - part of body: Hip     Time: 9381-8299 PT Time Calculation (min) (ACUTE ONLY): 31 min  Charges:  $Gait Training: 8-22 mins $Therapeutic Exercise: 8-22 mins                    G Codes:       Governor Rooks, PTA pager [phone]    Cristela Blue 09/20/2017, 2:55 PM

## 2017-09-21 ENCOUNTER — Inpatient Hospital Stay (HOSPITAL_COMMUNITY): Payer: Medicare Other

## 2017-09-21 DIAGNOSIS — M79609 Pain in unspecified limb: Secondary | ICD-10-CM

## 2017-09-21 LAB — CBC
HEMATOCRIT: 35.1 % — AB (ref 39.0–52.0)
Hemoglobin: 11.9 g/dL — ABNORMAL LOW (ref 13.0–17.0)
MCH: 31.7 pg (ref 26.0–34.0)
MCHC: 33.9 g/dL (ref 30.0–36.0)
MCV: 93.6 fL (ref 78.0–100.0)
PLATELETS: 198 10*3/uL (ref 150–400)
RBC: 3.75 MIL/uL — AB (ref 4.22–5.81)
RDW: 11.9 % (ref 11.5–15.5)
WBC: 10.8 10*3/uL — AB (ref 4.0–10.5)

## 2017-09-21 LAB — GLUCOSE, CAPILLARY
Glucose-Capillary: 142 mg/dL — ABNORMAL HIGH (ref 65–99)
Glucose-Capillary: 162 mg/dL — ABNORMAL HIGH (ref 65–99)

## 2017-09-21 NOTE — Progress Notes (Signed)
Physical Therapy Treatment Patient Details Name: Seth CRANEY Sr. MRN: [id number] DOB: 10/10/1940 Today's Date: 09/21/2017    History of Present Illness Pt is a 77 y/o male s/p elective L THA, posterior approach. PMH includes CAD, DM, DVT, HTN, OSA on CPAP, and back surgery.     PT Comments    Pt performed increased activity during session.  Pt remains to report pain in L shin but reports it is better.  Pt able to recall 3/3 precautions.  Pt remains to await LE doppler study and becoming anxious.  Called vascular who placed doppler as a high priority to reduce time for test to be performed.  Plan to d/c home later this pm.    Follow Up Recommendations  Follow surgeon's recommendation for DC plan and follow-up therapies;Supervision for mobility/OOB     Equipment Recommendations  Rolling walker with 5" wheels;3in1 (PT)    Recommendations for Other Services OT consult     Precautions / Restrictions Precautions Precautions: Posterior Hip Precaution Booklet Issued: Yes (comment) Precaution Comments: Reviewed posterior Hip precautions and patient able to recall 3/3 this am session.   Restrictions Weight Bearing Restrictions: Yes LLE Weight Bearing: Weight bearing as tolerated    Mobility  Bed Mobility               General bed mobility comments: pt sitting in recliner on arrival.    Transfers Overall transfer level: Needs assistance Equipment used: Rolling walker (2 wheeled) Transfers: Sit to/from Stand Sit to Stand: Supervision         General transfer comment: Cues for hand placement and LLE advancement forward to achieve standing.  Pt performed multiple transfers.   Ambulation/Gait Ambulation/Gait assistance: Min guard Ambulation Distance (Feet): 400 Feet Assistive device: Rolling walker (2 wheeled) Gait Pattern/deviations: Step-to pattern;Step-through pattern;Antalgic;Trunk flexed;Decreased stance time - left Gait velocity: decreased.    General Gait  Details: Cues for increase in R stride length to promote gait symmetry.  Pt with significant decrease stance time on L due to pain.     Stairs             Wheelchair Mobility    Modified Rankin (Stroke Patients Only)       Balance Overall balance assessment: Needs assistance Sitting-balance support: No upper extremity supported;Feet supported Sitting balance-Leahy Scale: Good     Standing balance support: Bilateral upper extremity supported;No upper extremity supported;During functional activity Standing balance-Leahy Scale: Fair Standing balance comment: Able to stand at sink for grooming tasks without UE support.                             Cognition Arousal/Alertness: Awake/alert Behavior During Therapy: WFL for tasks assessed/performed Overall Cognitive Status: Within Functional Limits for tasks assessed                                        Exercises Total Joint Exercises Ankle Circles/Pumps: AROM;Both;20 reps Quad Sets: AROM;10 reps;Left;Supine Short Arc Quad: AROM;Left;10 reps;Supine Heel Slides: Left;10 reps;Supine;AAROM Hip ABduction/ADduction: Left;10 reps;AROM;Standing Long Arc Quad: AROM;Left;10 reps;Seated Knee Flexion: AROM;Left;10 reps;Standing Marching in Standing: AROM;Left;10 reps;Standing Standing Hip Extension: AROM;Left;10 reps;Standing General Exercises - Lower Extremity Hip ABduction/ADduction: Left;10 reps;Supine;AAROM    General Comments        Pertinent Vitals/Pain Pain Assessment: 0-10 Pain Score: 6  Pain Location: L shin pain, thigh pain and  hip pain.  Shin remains warm to touch.   Pain Descriptors / Indicators: Sore;Guarding Pain Intervention(s): Monitored during session;Repositioned;Ice applied    Home Living                      Prior Function            PT Goals (current goals can now be found in the care plan section) Acute Rehab PT Goals Patient Stated Goal: to go home   Potential to Achieve Goals: Good Progress towards PT goals: Progressing toward goals    Frequency    7X/week      PT Plan Current plan remains appropriate    Co-evaluation              AM-PAC PT "6 Clicks" Daily Activity  Outcome Measure  Difficulty turning over in bed (including adjusting bedclothes, sheets and blankets)?: A Little Difficulty moving from lying on back to sitting on the side of the bed? : A Little Difficulty sitting down on and standing up from a chair with arms (e.g., wheelchair, bedside commode, etc,.)?: A Little Help needed moving to and from a bed to chair (including a wheelchair)?: A Little Help needed walking in hospital room?: A Little Help needed climbing 3-5 steps with a railing? : A Little 6 Click Score: 18    End of Session Equipment Utilized During Treatment: Gait belt Activity Tolerance: Patient limited by pain Patient left: in bed;with call bell/phone within reach;with family/visitor present Nurse Communication: Mobility status PT Visit Diagnosis: Other abnormalities of gait and mobility (R26.89);Pain Pain - Right/Left: Left Pain - part of body: Hip     Time: 2505-3976 PT Time Calculation (min) (ACUTE ONLY): 22 min  Charges:  $Gait Training: 8-22 mins                    G Codes:       Governor Rooks, PTA pager [phone]    Cristela Blue 09/21/2017, 11:25 AM

## 2017-09-21 NOTE — Progress Notes (Signed)
Preliminary notes--Left lower extremity venous duplex exam completed. Negative for DVT.   Hongying Landry Mellow (RDMS RVT) 09/21/17 12:32 PM

## 2017-09-21 NOTE — Progress Notes (Signed)
Patient's discharge was held yesterday because he developed significant calf pain on the left side with swelling.  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  An ultrasound was ordered but has not been completed yet.  He says that he is now feeling significantly better, not really having much in the way of calf pain at this point, denies any shortness of breath.   BP (!) 153/75 (BP Location: Right Arm)   Pulse 73   Temp 98.1 F (36.7 C) (Oral)   Resp 16   Ht 6' 2.5" (1.892 m)   Wt 122.5 kg (270 lb)   SpO2 96%   BMI 34.20 kg/m    Additional exam EHL and FHL are intact, sensation intact distally, calf is soft on the left side.  Impression status post left total hip replacement  Plan he is ambulating better at this point, we are going to make sure he does not have a recurrent DVT with an ultrasound today, that is pending.  If this is negative then plan to be discharged home after that is completed.

## 2017-09-21 NOTE — Progress Notes (Signed)
Occupational Therapy Treatment and Discharge Patient Details Name: Seth Mcintosh. MRN: [id number] DOB: [birth year] Today's Date: 09/21/2017    History of present illness Pt is a 77 y/o male s/p elective L THA, posterior approach. PMH includes CAD, DM, DVT, HTN, OSA on CPAP, and back surgery.    OT comments  This 77 yo male admitted and underwent above presents to acute OT today with all basic ADLs addressed at this point in his stay. No further OT needs, we will sign off  Follow Up Recommendations  No OT follow up;Supervision - Intermittent    Equipment Recommendations  3 in 1 bedside commode       Precautions / Restrictions Precautions Precautions: Posterior Hip Precaution Booklet Issued: Yes (comment) Precaution Comments: Reviewed posterior Hip precautions and patient able to recall 3/3 this pm session.   Restrictions Weight Bearing Restrictions: Yes LLE Weight Bearing: Weight bearing as tolerated       Mobility Bed Mobility               General bed mobility comments: pt sitting EOB upon my arrival  Transfers Overall transfer level: Needs assistance Equipment used: Rolling walker (2 wheeled) Transfers: Sit to/from Stand Sit to Stand: Modified independent (Device/Increase time)                ADL either performed or assessed with clinical judgement   ADL Overall ADL's : Needs assistance/impaired                       Lower Body Dressing Details (indicate cue type and reason): Pt aware he is to dress LLE on first then his RLE when it comes to underwear and pants, grandson getting AE kit for pt           Tub/Shower Transfer Details (indicate cue type and reason): S to ambulate to tub bench and for sit<>stand, min A for LLE to lift into tub--pt reports he will not be getting into tub for shower for 2 weeks (that is what PA told him this morning)   General ADL Comments: We talked about him using RW as a carrier for clothes by drapping the  across the front and sides (under hand holds)--he has a top loading washer and dryer so this is good from his hip precaution perspective. His grandson is going to help him find something that he has in his house that he can place across the bars just under the hand holds on his walker so he can use it as a tray to carry items from kitchen to dining room     Vision Patient Visual Report: No change from baseline            Cognition Arousal/Alertness: Awake/alert Behavior During Therapy: WFL for tasks assessed/performed Overall Cognitive Status: Within Functional Limits for tasks assessed                                                     Pertinent Vitals/ Pain       Pain Assessment: Faces Faces Pain Scale: Hurts a little bit Pain Location: left thigh and hip, says shin is feeling better Pain Descriptors / Indicators: Aching;Sore(stiff) Pain Intervention(s): Monitored during session;Repositioned         Frequency  Min 2X/week  Progress Toward Goals  OT Goals(current goals can now be found in the care plan section)  Progress towards OT goals: (All education completed)  Acute Rehab OT Goals Patient Stated Goal: to go home   Plan Discharge plan remains appropriate       AM-PAC PT "6 Clicks" Daily Activity     Outcome Measure   Help from another person eating meals?: None Help from another person taking care of personal grooming?: None Help from another person toileting, which includes using toliet, bedpan, or urinal?: None Help from another person bathing (including washing, rinsing, drying)?: None Help from another person to put on and taking off regular upper body clothing?: None Help from another person to put on and taking off regular lower body clothing?: None 6 Click Score: 24    End of Session Equipment Utilized During Treatment: Rolling walker  OT Visit Diagnosis: Other abnormalities of gait and mobility (R26.89);Pain Pain -  Right/Left: Left Pain - part of body: Leg   Activity Tolerance Patient tolerated treatment well   Patient Left in chair;with call bell/phone within reach   Nurse Communication (pt ready to go from a therapy standpoint)        Time: 1409-1438 OT Time Calculation (min): 29 min  Charges: OT General Charges $OT Visit: 1 Visit OT Treatments $Self Care/Home Management : 23-37 mins  Cathy Leonard, OTR/L [phone] 09/21/2017 '   

## 2017-09-21 NOTE — Progress Notes (Signed)
Patient discharge instructions given. Patient verbalized understanding. Patient is going home accompanied by family.

## 2017-09-23 DIAGNOSIS — E119 Type 2 diabetes mellitus without complications: Secondary | ICD-10-CM | POA: Diagnosis not present

## 2017-09-23 DIAGNOSIS — M069 Rheumatoid arthritis, unspecified: Secondary | ICD-10-CM | POA: Diagnosis not present

## 2017-09-23 DIAGNOSIS — I10 Essential (primary) hypertension: Secondary | ICD-10-CM | POA: Diagnosis not present

## 2017-09-23 DIAGNOSIS — I2510 Atherosclerotic heart disease of native coronary artery without angina pectoris: Secondary | ICD-10-CM | POA: Diagnosis not present

## 2017-09-23 DIAGNOSIS — F329 Major depressive disorder, single episode, unspecified: Secondary | ICD-10-CM | POA: Diagnosis not present

## 2017-09-23 DIAGNOSIS — Z471 Aftercare following joint replacement surgery: Secondary | ICD-10-CM | POA: Diagnosis not present

## 2017-09-25 DIAGNOSIS — I2510 Atherosclerotic heart disease of native coronary artery without angina pectoris: Secondary | ICD-10-CM | POA: Diagnosis not present

## 2017-09-25 DIAGNOSIS — M069 Rheumatoid arthritis, unspecified: Secondary | ICD-10-CM | POA: Diagnosis not present

## 2017-09-25 DIAGNOSIS — F329 Major depressive disorder, single episode, unspecified: Secondary | ICD-10-CM | POA: Diagnosis not present

## 2017-09-25 DIAGNOSIS — Z471 Aftercare following joint replacement surgery: Secondary | ICD-10-CM | POA: Diagnosis not present

## 2017-09-25 DIAGNOSIS — E119 Type 2 diabetes mellitus without complications: Secondary | ICD-10-CM | POA: Diagnosis not present

## 2017-09-25 DIAGNOSIS — I10 Essential (primary) hypertension: Secondary | ICD-10-CM | POA: Diagnosis not present

## 2017-09-27 DIAGNOSIS — M069 Rheumatoid arthritis, unspecified: Secondary | ICD-10-CM | POA: Diagnosis not present

## 2017-09-27 DIAGNOSIS — E119 Type 2 diabetes mellitus without complications: Secondary | ICD-10-CM | POA: Diagnosis not present

## 2017-09-27 DIAGNOSIS — F329 Major depressive disorder, single episode, unspecified: Secondary | ICD-10-CM | POA: Diagnosis not present

## 2017-09-27 DIAGNOSIS — Z471 Aftercare following joint replacement surgery: Secondary | ICD-10-CM | POA: Diagnosis not present

## 2017-09-27 DIAGNOSIS — I10 Essential (primary) hypertension: Secondary | ICD-10-CM | POA: Diagnosis not present

## 2017-09-27 DIAGNOSIS — I2510 Atherosclerotic heart disease of native coronary artery without angina pectoris: Secondary | ICD-10-CM | POA: Diagnosis not present

## 2017-09-28 DIAGNOSIS — E119 Type 2 diabetes mellitus without complications: Secondary | ICD-10-CM | POA: Diagnosis not present

## 2017-09-28 DIAGNOSIS — I2510 Atherosclerotic heart disease of native coronary artery without angina pectoris: Secondary | ICD-10-CM | POA: Diagnosis not present

## 2017-09-28 DIAGNOSIS — Z471 Aftercare following joint replacement surgery: Secondary | ICD-10-CM | POA: Diagnosis not present

## 2017-09-28 DIAGNOSIS — F329 Major depressive disorder, single episode, unspecified: Secondary | ICD-10-CM | POA: Diagnosis not present

## 2017-09-28 DIAGNOSIS — I10 Essential (primary) hypertension: Secondary | ICD-10-CM | POA: Diagnosis not present

## 2017-09-28 DIAGNOSIS — M069 Rheumatoid arthritis, unspecified: Secondary | ICD-10-CM | POA: Diagnosis not present

## 2017-10-01 DIAGNOSIS — Z96642 Presence of left artificial hip joint: Secondary | ICD-10-CM | POA: Diagnosis not present

## 2017-10-02 DIAGNOSIS — Z96642 Presence of left artificial hip joint: Secondary | ICD-10-CM | POA: Diagnosis not present

## 2017-10-02 DIAGNOSIS — M25552 Pain in left hip: Secondary | ICD-10-CM | POA: Diagnosis not present

## 2017-10-04 DIAGNOSIS — M25552 Pain in left hip: Secondary | ICD-10-CM | POA: Diagnosis not present

## 2017-10-04 DIAGNOSIS — Z96642 Presence of left artificial hip joint: Secondary | ICD-10-CM | POA: Diagnosis not present

## 2017-10-10 DIAGNOSIS — M25552 Pain in left hip: Secondary | ICD-10-CM | POA: Diagnosis not present

## 2017-10-10 DIAGNOSIS — Z96642 Presence of left artificial hip joint: Secondary | ICD-10-CM | POA: Diagnosis not present

## 2017-10-12 DIAGNOSIS — Z96642 Presence of left artificial hip joint: Secondary | ICD-10-CM | POA: Diagnosis not present

## 2017-10-12 DIAGNOSIS — M25552 Pain in left hip: Secondary | ICD-10-CM | POA: Diagnosis not present

## 2017-10-16 DIAGNOSIS — M25552 Pain in left hip: Secondary | ICD-10-CM | POA: Diagnosis not present

## 2017-10-16 DIAGNOSIS — Z96642 Presence of left artificial hip joint: Secondary | ICD-10-CM | POA: Diagnosis not present

## 2017-10-19 DIAGNOSIS — M25552 Pain in left hip: Secondary | ICD-10-CM | POA: Diagnosis not present

## 2017-10-19 DIAGNOSIS — Z96642 Presence of left artificial hip joint: Secondary | ICD-10-CM | POA: Diagnosis not present

## 2017-10-24 DIAGNOSIS — Z96642 Presence of left artificial hip joint: Secondary | ICD-10-CM | POA: Diagnosis not present

## 2017-10-24 DIAGNOSIS — M25552 Pain in left hip: Secondary | ICD-10-CM | POA: Diagnosis not present

## 2017-10-26 DIAGNOSIS — M25552 Pain in left hip: Secondary | ICD-10-CM | POA: Diagnosis not present

## 2017-10-26 DIAGNOSIS — Z96642 Presence of left artificial hip joint: Secondary | ICD-10-CM | POA: Diagnosis not present

## 2017-10-29 DIAGNOSIS — Z96642 Presence of left artificial hip joint: Secondary | ICD-10-CM | POA: Diagnosis not present

## 2017-10-30 DIAGNOSIS — Z96642 Presence of left artificial hip joint: Secondary | ICD-10-CM | POA: Diagnosis not present

## 2017-10-30 DIAGNOSIS — M25552 Pain in left hip: Secondary | ICD-10-CM | POA: Diagnosis not present

## 2017-11-01 ENCOUNTER — Other Ambulatory Visit: Payer: Self-pay

## 2017-11-01 ENCOUNTER — Encounter: Payer: Self-pay | Admitting: *Deleted

## 2017-11-01 DIAGNOSIS — J342 Deviated nasal septum: Secondary | ICD-10-CM | POA: Diagnosis not present

## 2017-11-01 DIAGNOSIS — J301 Allergic rhinitis due to pollen: Secondary | ICD-10-CM | POA: Diagnosis not present

## 2017-11-01 DIAGNOSIS — J343 Hypertrophy of nasal turbinates: Secondary | ICD-10-CM | POA: Diagnosis not present

## 2017-11-01 DIAGNOSIS — J3489 Other specified disorders of nose and nasal sinuses: Secondary | ICD-10-CM | POA: Diagnosis not present

## 2017-11-01 DIAGNOSIS — G4733 Obstructive sleep apnea (adult) (pediatric): Secondary | ICD-10-CM | POA: Diagnosis not present

## 2017-11-02 DIAGNOSIS — M25552 Pain in left hip: Secondary | ICD-10-CM | POA: Diagnosis not present

## 2017-11-02 DIAGNOSIS — Z96642 Presence of left artificial hip joint: Secondary | ICD-10-CM | POA: Diagnosis not present

## 2017-11-05 DIAGNOSIS — Z96642 Presence of left artificial hip joint: Secondary | ICD-10-CM | POA: Diagnosis not present

## 2017-11-05 DIAGNOSIS — M25552 Pain in left hip: Secondary | ICD-10-CM | POA: Diagnosis not present

## 2017-11-06 ENCOUNTER — Encounter
Admission: RE | Disposition: A | Discharge status: Home / Self Care | Payer: Self-pay | Source: Ambulatory Visit | Attending: Otolaryngology

## 2017-11-06 ENCOUNTER — Ambulatory Visit: Payer: Medicare Other | Admitting: Anesthesiology

## 2017-11-06 ENCOUNTER — Ambulatory Visit
Admission: RE | Admit: 2017-11-06 | Discharge: 2017-11-06 | Disposition: A | Discharge status: Home / Self Care | Payer: Medicare Other | Source: Ambulatory Visit | Attending: Otolaryngology | Admitting: Otolaryngology

## 2017-11-06 DIAGNOSIS — Z7951 Long term (current) use of inhaled steroids: Secondary | ICD-10-CM | POA: Diagnosis not present

## 2017-11-06 DIAGNOSIS — J343 Hypertrophy of nasal turbinates: Secondary | ICD-10-CM | POA: Diagnosis not present

## 2017-11-06 DIAGNOSIS — J3489 Other specified disorders of nose and nasal sinuses: Secondary | ICD-10-CM | POA: Diagnosis not present

## 2017-11-06 DIAGNOSIS — Z87891 Personal history of nicotine dependence: Secondary | ICD-10-CM | POA: Insufficient documentation

## 2017-11-06 DIAGNOSIS — M1990 Unspecified osteoarthritis, unspecified site: Secondary | ICD-10-CM | POA: Insufficient documentation

## 2017-11-06 DIAGNOSIS — Z79899 Other long term (current) drug therapy: Secondary | ICD-10-CM | POA: Diagnosis not present

## 2017-11-06 DIAGNOSIS — I739 Peripheral vascular disease, unspecified: Secondary | ICD-10-CM | POA: Diagnosis not present

## 2017-11-06 DIAGNOSIS — G4730 Sleep apnea, unspecified: Secondary | ICD-10-CM | POA: Diagnosis not present

## 2017-11-06 DIAGNOSIS — I119 Hypertensive heart disease without heart failure: Secondary | ICD-10-CM | POA: Insufficient documentation

## 2017-11-06 DIAGNOSIS — J301 Allergic rhinitis due to pollen: Secondary | ICD-10-CM | POA: Diagnosis not present

## 2017-11-06 DIAGNOSIS — Z951 Presence of aortocoronary bypass graft: Secondary | ICD-10-CM | POA: Diagnosis not present

## 2017-11-06 DIAGNOSIS — Z7984 Long term (current) use of oral hypoglycemic drugs: Secondary | ICD-10-CM | POA: Insufficient documentation

## 2017-11-06 DIAGNOSIS — I2510 Atherosclerotic heart disease of native coronary artery without angina pectoris: Secondary | ICD-10-CM | POA: Diagnosis not present

## 2017-11-06 DIAGNOSIS — J342 Deviated nasal septum: Secondary | ICD-10-CM | POA: Insufficient documentation

## 2017-11-06 HISTORY — PX: NASAL TURBINATE REDUCTION: SHX2072

## 2017-11-06 HISTORY — PX: SEPTOPLASTY: SHX2393

## 2017-11-06 HISTORY — DX: Cardiac arrhythmia, unspecified: I49.9

## 2017-11-06 HISTORY — PX: ENDOSCOPIC CONCHA BULLOSA RESECTION: SHX6395

## 2017-11-06 LAB — GLUCOSE, CAPILLARY
GLUCOSE-CAPILLARY: 150 mg/dL — AB (ref 70–99)
GLUCOSE-CAPILLARY: 182 mg/dL — AB (ref 70–99)

## 2017-11-06 SURGERY — SEPTOPLASTY, NOSE
Anesthesia: General | Wound class: Clean Contaminated

## 2017-11-06 MED ORDER — SUCCINYLCHOLINE CHLORIDE 20 MG/ML IJ SOLN
INTRAMUSCULAR | Status: DC | PRN
  Administered 2017-11-06: 120 mg via INTRAVENOUS

## 2017-11-06 MED ORDER — ACETAMINOPHEN 10 MG/ML IV SOLN
1000.0000 mg | Freq: Once | INTRAVENOUS | Status: AC
  Administered 2017-11-06: 1000 mg via INTRAVENOUS

## 2017-11-06 MED ORDER — MIDAZOLAM HCL 5 MG/5ML IJ SOLN
INTRAMUSCULAR | Status: DC | PRN
  Administered 2017-11-06: 2 mg via INTRAVENOUS

## 2017-11-06 MED ORDER — PROPOFOL 10 MG/ML IV BOLUS
INTRAVENOUS | Status: DC | PRN
  Administered 2017-11-06: 150 mg via INTRAVENOUS

## 2017-11-06 MED ORDER — LIDOCAINE HCL (CARDIAC) PF 100 MG/5ML IV SOSY
PREFILLED_SYRINGE | INTRAVENOUS | Status: DC | PRN
  Administered 2017-11-06: 40 mg via INTRAVENOUS

## 2017-11-06 MED ORDER — DEXMEDETOMIDINE HCL IN NACL 200 MCG/50ML IV SOLN
INTRAVENOUS | Status: DC | PRN
  Administered 2017-11-06: 8 ug via INTRAVENOUS

## 2017-11-06 MED ORDER — HYDROCODONE-ACETAMINOPHEN 5-325 MG PO TABS: 1.0000 | ORAL_TABLET | Freq: Four times a day (QID) | ORAL | 0 refills | Status: AC | PRN

## 2017-11-06 MED ORDER — GLYCOPYRROLATE 0.2 MG/ML IJ SOLN
INTRAMUSCULAR | Status: DC | PRN
  Administered 2017-11-06: 0.2 mg via INTRAVENOUS

## 2017-11-06 MED ORDER — OXYCODONE HCL 5 MG PO TABS
5.0000 mg | ORAL_TABLET | Freq: Once | ORAL | Status: AC | PRN
  Administered 2017-11-06: 5 mg via ORAL

## 2017-11-06 MED ORDER — CEPHALEXIN 500 MG PO CAPS: 500.0000 mg | ORAL_CAPSULE | Freq: Two times a day (BID) | ORAL | 0 refills | Status: AC

## 2017-11-06 MED ORDER — OXYMETAZOLINE HCL 0.05 % NA SOLN
NASAL | Status: DC | PRN
  Administered 2017-11-06: 4 via TOPICAL

## 2017-11-06 MED ORDER — DEXAMETHASONE SODIUM PHOSPHATE 4 MG/ML IJ SOLN
INTRAMUSCULAR | Status: DC | PRN
  Administered 2017-11-06: 4 mg via INTRAVENOUS

## 2017-11-06 MED ORDER — LACTATED RINGERS IV SOLN: 10.0000 mL/h | INTRAVENOUS | Status: DC

## 2017-11-06 MED ORDER — ONDANSETRON HCL 4 MG/2ML IJ SOLN
INTRAMUSCULAR | Status: DC | PRN
  Administered 2017-11-06: 4 mg via INTRAVENOUS

## 2017-11-06 MED ORDER — FENTANYL CITRATE (PF) 100 MCG/2ML IJ SOLN
INTRAMUSCULAR | Status: DC | PRN
  Administered 2017-11-06 (×3): 50 ug via INTRAVENOUS

## 2017-11-06 MED ORDER — EPHEDRINE SULFATE 50 MG/ML IJ SOLN
INTRAMUSCULAR | Status: DC | PRN
  Administered 2017-11-06: 5 mg via INTRAVENOUS

## 2017-11-06 MED ORDER — ACETAMINOPHEN 10 MG/ML IV SOLN
INTRAVENOUS | Status: DC | PRN
  Administered 2017-11-06: 1000 mg via INTRAVENOUS

## 2017-11-06 MED ORDER — LACTATED RINGERS IV SOLN
INTRAVENOUS | Status: DC | PRN
  Administered 2017-11-06 (×2): via INTRAVENOUS

## 2017-11-06 MED ORDER — OXYCODONE HCL 5 MG/5ML PO SOLN: 5.0000 mg | Freq: Once | ORAL | Status: AC | PRN

## 2017-11-06 MED ORDER — FENTANYL CITRATE (PF) 100 MCG/2ML IJ SOLN: 25.0000 ug | INTRAMUSCULAR | Status: DC | PRN

## 2017-11-06 MED ORDER — LIDOCAINE-EPINEPHRINE 1 %-1:100000 IJ SOLN
INTRAMUSCULAR | Status: DC | PRN
  Administered 2017-11-06: 12 mL

## 2017-11-06 SURGICAL SUPPLY — 17 items
CANISTER SUCT 1200ML W/VALVE (MISCELLANEOUS) ×5 IMPLANT
COAG SUCT 10F 3.5MM HAND CTRL (MISCELLANEOUS) ×5 IMPLANT
DRAPE HEAD BAR (DRAPES) ×5 IMPLANT
DRESSING NASL FOAM PST OP SINU (MISCELLANEOUS) ×6 IMPLANT
DRSG NASAL FOAM POST OP SINU (MISCELLANEOUS) ×10
ELECT REM PT RETURN 9FT ADLT (ELECTROSURGICAL) ×5
ELECTRODE REM PT RTRN 9FT ADLT (ELECTROSURGICAL) ×3 IMPLANT
GLOVE EXAM LX STRL 7.5 (GLOVE) ×10 IMPLANT
KIT TURNOVER KIT A (KITS) ×5 IMPLANT
NEEDLE HYPO 25GX1X1/2 BEV (NEEDLE) ×5 IMPLANT
PACK DRAPE NASAL/ENT (PACKS) ×5 IMPLANT
PATTIES SURGICAL .5 X3 (DISPOSABLE) ×5 IMPLANT
SUT CHROMIC 4 0 RB 1X27 (SUTURE) ×5 IMPLANT
SUT PLAIN GUT 4-0 (SUTURE) ×5 IMPLANT
SYR 10ML LL (SYRINGE) ×5 IMPLANT
TOWEL OR 17X26 4PK STRL BLUE (TOWEL DISPOSABLE) ×5 IMPLANT
WATER STERILE IRR 250ML POUR (IV SOLUTION) ×5 IMPLANT

## 2017-11-06 NOTE — Op Note (Signed)
11/06/2017  10:50 AM    Seth Mulberry Sr.  [id number]   Pre-Op Diagnosis:  NASAL OBSTRUCTION, DEVIATED SEPTUM, BILATERAL INFERIOR TURBINATE HYPERTROPHY, LEFT    CONCHA BULLOSA Post-op Diagnosis: SAME  Procedure:  1)  Endoscopic left concha bullosa resection,   2)  Nasal Septoplasty   3)  Bilateral submucous resection of the inferior turbinates    Surgeon:  Riley Nearing  Anesthesia:  General endotracheal  EBL:  99BZ  Complications:  None  Findings: right septal deviation, bilateral inferior turbinate hypertrophy, left concha bullosa  Procedure: After the patient was identified in holding and the benefits of the procedure were reviewed as well as the consent and risks, the patient was taken to the operating room and with the patient in a comfortable supine position,  general orotracheal anesthesia was induced without difficulty.  A proper time-out was performed.  The [employer] image guidance system was set up and calibrated in the normal fashion and felt to be acceptable.  Next 1% Xylocaine with 1:100,000 epinephrine was infiltrated into the inferior turbinates, septum, and left anterior middle turbinate.  Several minutes were allowed for this to take effect.  Cottoniod pledgets soaked in Afrin were placed into both nasal cavities and left while the patient was prepped and draped in the standard fashion.   The left nasal cavity was then inspected endoscopically. The anterior edge of the middle turbinate was incised with a sickle knife, entering the air cell of the concha bullosa. Endoscopic scissors were then used to excise the lateral wall of the concha bullosa completely. Bleeding from the cut edge of the turbinate was controlled with the suction Bovie.   Beginning on the left hand side a hemitransfixion incision was then created on the leading edge of the septum on the left.  A subperichondrial plane was elevated posteriorly on the left and taken back to the perpendicular plate of  the ethmoid where subperiosteal plane was elevated posteriorly on the left. A large septal spur was identified on the left hand side.  An inferior rim of redundant septal cartilage was removed from where it deviated over the maxillary crest. The perpendicular plate of the ethmoid was separated from the quadrangular cartilage, and a subperiosteal plane elevated on the right of the bony septum. The large septal spur was removed, dividing the septal bone superiorly with Knight scissors, and inferiorly from the maxillary crest with a chisel.  S small portion of the mid-septal cartilage, which was devoated to the right, was resected to allow further septal straightening, taking care to preserve a generous caudal and dorsal strut of cartilage.   The septum was then replaced in the midline. Reinspection through each nostril showed excellent reduction of the septal deformity. A left posterior inferior fenestration was then created to allow hematoma drainage.  The septal incision was closed with 4-0 chromic gut suture. A 4-0 plain gut suture was used to reapproximate the septal flaps to the underlying cartilage, utilizing a running, quilting type stitch.   Next, beginning on the left-hand side, a 15 blade was used to incise along the inferior edge of the inferior turbinate. A superior laterally based flap of the medial turbinate mucosa was then elevated. A portion of the underlying conchal bone and lateral mucosa was excised using Knight scissors. The flap was then laid back over the turbinate stump and the bleeding edge of the turbinate cauterized using suction cautery. In a similar fashion the submucous resection was performed on the right.  The nose was suctioned and inspected. Stammberger absorbable sinus packing was then placed in the left middle meatus to control minor oozing. The patient was then returned to the anesthesiologist for awakening and taken to recovery room in good condition  postoperatively.  Disposition:   PACU and d/c home  Plan: Ice, elevation, narcotic analgesia and prophylactic antibiotics. Begin nasal irrigations with saline tomrrow, irrigating 3-4 times daily. Return to the office in 7 days.  Return to work in 7-10 days, no strenuous activities for two weeks.   Riley Nearing 11/06/2017 10:50 AM

## 2017-11-06 NOTE — Addendum Note (Signed)
Addendum  created 11/06/17 1327 by Georga Bora, CRNA   Intraprocedure Meds edited

## 2017-11-06 NOTE — Anesthesia Postprocedure Evaluation (Signed)
Anesthesia Post Note  Patient: Seth CHERMAK Sr.  Procedure(s) Performed: SEPTOPLASTY (N/A ) ENDOSCOPIC CONCHA BULLOSA RESECTION (Left ) BILATERAL SUBMUCOSAL RESECTION TURBINATES (Bilateral )  Patient location during evaluation: PACU Anesthesia Type: General Level of consciousness: awake Pain management: pain level controlled Vital Signs Assessment: post-procedure vital signs reviewed and stable Respiratory status: respiratory function stable Cardiovascular status: stable Postop Assessment: no signs of nausea or vomiting Anesthetic complications: no    Veda Canning

## 2017-11-06 NOTE — Anesthesia Procedure Notes (Signed)
Procedure Name: Intubation Date/Time: 11/06/2017 9:47 AM Performed by: Georga Bora, CRNA Pre-anesthesia Checklist: Patient identified, Emergency Drugs available, Suction available, Patient being monitored and Timeout performed Patient Re-evaluated:Patient Re-evaluated prior to induction Oxygen Delivery Method: Circle system utilized Preoxygenation: Pre-oxygenation with 100% oxygen Induction Type: IV induction Ventilation: Mask ventilation without difficulty Laryngoscope Size: Mac and 4 Grade View: Grade I Tube type: Oral Tube size: 7.5 mm Number of attempts: 1 Airway Equipment and Method: Stylet Placement Confirmation: ETT inserted through vocal cords under direct vision,  positive ETCO2 and breath sounds checked- equal and bilateral Tube secured with: Tape Dental Injury: Teeth and Oropharynx as per pre-operative assessment  Comments: Taped to the left side.

## 2017-11-06 NOTE — Discharge Instructions (Signed)
[facility] °[facility] °ENDOSCOPIC SINUS SURGERY °[facility] ° °What is Functional Endoscopic Sinus Surgery? ° The Surgery involves making the natural openings of the sinuses larger by removing the bony partitions that separate the sinuses from the nasal cavity.  The natural sinus lining is preserved as much as possible to allow the sinuses to resume normal function after the surgery.  In some patients nasal polyps (excessively swollen lining of the sinuses) may be removed to relieve obstruction of the sinus openings.  The surgery is performed through the nose using lighted scopes, which eliminates the need for incisions on the face.  A septoplasty is a different procedure which is sometimes performed with sinus surgery.  It involves straightening the boy partition that separates the two sides of your nose.  A crooked or deviated septum may need repair if is obstructing the sinuses or nasal airflow.  Turbinate reduction is also often performed during sinus surgery.  The turbinates are bony proturberances from the side walls of the nose which swell and can obstruct the nose in patients with sinus and allergy problems.  Their size can be surgically reduced to help relieve nasal obstruction. ° °What Can Sinus Surgery Do For Me? ° Sinus surgery can reduce the frequency of sinus infections requiring antibiotic treatment.  This can provide improvement in nasal congestion, post-nasal drainage, facial pressure and nasal obstruction.  Surgery will NOT prevent you from ever having an infection again, so it usually only for patients who get infections 4 or more times yearly requiring antibiotics, or for infections that do not clear with antibiotics.  It will not cure nasal allergies, so patients with allergies may still require medication to treat their allergies after surgery. Surgery may improve headaches related to sinusitis, however, some people will continue to  require medication to control sinus headaches related to allergies.  Surgery will do nothing for other forms of headache (migraine, tension or cluster). ° °What Are the Risks of Endoscopic Sinus Surgery? ° Current techniques allow surgery to be performed safely with little risk, however, there are rare complications that patients should be aware of.  Because the sinuses are located around the eyes, there is risk of eye injury, including blindness, though again, this would be quite rare. This is usually a result of bleeding behind the eye during surgery, which puts the vision oat risk, though there are treatments to protect the vision and prevent permanent disrupted by surgery causing a leak of the spinal fluid that surrounds the brain.  More serious complications would include bleeding inside the brain cavity or damage to the brain.  Again, all of these complications are uncommon, and spinal fluid leaks can be safely managed surgically if they occur.  The most common complication of sinus surgery is bleeding from the nose, which may require packing or cauterization of the nose.  Continued sinus have polyps may experience recurrence of the polyps requiring revision surgery.  Alterations of sense of smell or injury to the tear ducts are also rare complications.  ° °What is the Surgery Like, and what is the Recovery? ° The Surgery usually takes a couple of hours to perform, and is usually performed under a general anesthetic (completely asleep).  Patients are usually discharged home after a couple of hours.  Sometimes during surgery it is necessary to pack the nose to control bleeding, and the packing is left in place for 24 - 48 hours, and removed by your surgeon.    If a septoplasty was performed during the procedure, there is often a splint placed which must be removed after 5-7 days.   °Discomfort: Pain is usually mild to moderate, and can be controlled by prescription pain medication or acetaminophen (Tylenol).   Aspirin, Ibuprofen (Advil, Motrin), or Naprosyn (Aleve) should be avoided, as they can cause increased bleeding.  Most patients feel sinus pressure like they have a bad head cold for several days.  Sleeping with your head elevated can help reduce swelling and facial pressure, as can ice packs over the face.  A humidifier may be helpful to keep the mucous and blood from drying in the nose.  ° °Diet: There are no specific diet restrictions, however, you should generally start with clear liquids and a light diet of bland foods because the anesthetic can cause some nausea.  Advance your diet depending on how your stomach feels.  Taking your pain medication with food will often help reduce stomach upset which pain medications can cause. ° °Nasal Saline Irrigation: It is important to remove blood clots and dried mucous from the nose as it is healing.  This is done by having you irrigate the nose at least 3 - 4 times daily with a salt water solution.  We recommend using NeilMed Sinus Rinse (available at the drug store).  Fill the squeeze bottle with the solution, bend over a sink, and insert the tip of the squeeze bottle into the nose ½ of an inch.  Point the tip of the squeeze bottle towards the inside corner of the eye on the same side your irrigating.  Squeeze the bottle and gently irrigate the nose.  If you bend forward as you do this, most of the fluid will flow back out of the nose, instead of down your throat.   The solution should be warm, near body temperature, when you irrigate.   Each time you irrigate, you should use a full squeeze bottle.  ° °Note that if you are instructed to use Nasal Steroid Sprays at any time after your surgery, irrigate with saline BEFORE using the steroid spray, so you do not wash it all out of the nose. °Another product, Nasal Saline Gel (such as AYR Nasal Saline Gel) can be applied in each nostril 3 - 4 times daily to moisture the nose and reduce scabbing or crusting. ° °Bleeding:   Bloody drainage from the nose can be expected for several days, and patients are instructed to irrigate their nose frequently with salt water to help remove mucous and blood clots.  The drainage may be dark red or brown, though some fresh blood may be seen intermittently, especially after irrigation.  Do not blow you nose, as bleeding may occur. If you must sneeze, keep your mouth open to allow air to escape through your mouth. ° °If heavy bleeding occurs: Irrigate the nose with saline to rinse out clots, then spray the nose 3 - 4 times with Afrin Nasal Decongestant Spray.  The spray will constrict the blood vessels to slow bleeding.  Pinch the lower half of your nose shut to apply pressure, and lay down with your head elevated.  Ice packs over the nose may help as well. If bleeding persists despite these measures, you should notify your doctor.  Do not use the Afrin routinely to control nasal congestion after surgery, as it can result in worsening congestion and may affect healing.  ° ° ° °Activity: Return to work varies among patients. Most patients will be   out of work at least 5 - 7 days to recover.  Patient may return to work after they are off of narcotic pain medication, and feeling well enough to perform the functions of their job.  Patients must avoid heavy lifting (over 10 pounds) or strenuous physical for 2 weeks after surgery, so your employer may need to assign you to light duty, or keep you out of work longer if light duty is not possible.  NOTE: you should not drive, operate dangerous machinery, do any mentally demanding tasks or make any important legal or financial decisions while on narcotic pain medication and recovering from the general anesthetic.  °  °Call Your Doctor Immediately if You Have Any of the Following: °1. Bleeding that you cannot control with the above measures °2. Loss of vision, double vision, bulging of the eye or black eyes. °3. Fever over 101 degrees °4. Neck stiffness with  severe headache, fever, nausea and change in mental state. °You are always encourage to call anytime with concerns, however, please call with requests for pain medication refills during office hours. ° °Office Endoscopy: During follow-up visits your doctor will remove any packing or splints that may have been placed and evaluate and clean your sinuses endoscopically.  Topical anesthetic will be used to make this as comfortable as possible, though you may want to take your pain medication prior to the visit.  How often this will need to be done varies from patient to patient.  After complete recovery from the surgery, you may need follow-up endoscopy from time to time, particularly if there is concern of recurrent infection or nasal polyps. ° ° °General Anesthesia, Adult, Care After °These instructions provide you with information about caring for yourself after your procedure. Your health care provider may also give you more specific instructions. Your treatment has been planned according to current medical practices, but problems sometimes occur. Call your health care provider if you have any problems or questions after your procedure. °What can I expect after the procedure? °After the procedure, it is common to have: °· Vomiting. °· A sore throat. °· Mental slowness. ° °It is common to feel: °· Nauseous. °· Cold or shivery. °· Sleepy. °· Tired. °· Sore or achy, even in parts of your body where you did not have surgery. ° °Follow these instructions at home: °For at least 24 hours after the procedure: °· Do not: °? Participate in activities where you could fall or become injured. °? Drive. °? Use heavy machinery. °? Drink alcohol. °? Take sleeping pills or medicines that cause drowsiness. °? Make important decisions or sign legal documents. °? Take care of children on your own. °· Rest. °Eating and drinking °· If you vomit, drink water, juice, or soup when you can drink without vomiting. °· Drink enough fluid to  keep your urine clear or pale yellow. °· Make sure you have little or no nausea before eating solid foods. °· Follow the diet recommended by your health care provider. °General instructions °· Have a responsible adult stay with you until you are awake and alert. °· Return to your normal activities as told by your health care provider. Ask your health care provider what activities are safe for you. °· Take over-the-counter and prescription medicines only as told by your health care provider. °· If you smoke, do not smoke without supervision. °· Keep all follow-up visits as told by your health care provider. This is important. °Contact a health care provider if: °· You   continue to have nausea or vomiting at home, and medicines are not helpful. °· You cannot drink fluids or start eating again. °· You cannot urinate after 8-12 hours. °· You develop a skin rash. °· You have fever. °· You have increasing redness at the site of your procedure. °Get help right away if: °· You have difficulty breathing. °· You have chest pain. °· You have unexpected bleeding. °· You feel that you are having a life-threatening or urgent problem. °This information is not intended to replace advice given to you by your health care provider. Make sure you discuss any questions you have with your health care provider. °Document Released: 07/17/2000 Document Revised: 09/13/2015 Document Reviewed: 03/25/2015 °Elsevier Interactive Patient Education © 2018 [employer] ° °

## 2017-11-06 NOTE — H&P (Signed)
History and physical reviewed and will be scanned in later. No change in medical status reported by the patient or family, appears stable for surgery. All questions regarding the procedure answered, and patient (or family if a child) expressed understanding of the procedure. ? ?Seth Mcintosh S Cobin Cadavid ?[email or web address]@ ?

## 2017-11-06 NOTE — Transfer of Care (Signed)
Immediate Anesthesia Transfer of Care Note  Patient: Seth Mulberry Sr.  Procedure(s) Performed: SEPTOPLASTY (N/A ) ENDOSCOPIC CONCHA BULLOSA RESECTION (Left ) BILATERAL SUBMUCOSAL RESECTION TURBINATES (Bilateral )  Patient Location: PACU  Anesthesia Type: General  Level of Consciousness: awake, alert  and patient cooperative  Airway and Oxygen Therapy: Patient Spontanous Breathing and Patient connected to supplemental oxygen  Post-op Assessment: Post-op Vital signs reviewed, Patient's Cardiovascular Status Stable, Respiratory Function Stable, Patent Airway and No signs of Nausea or vomiting  Post-op Vital Signs: Reviewed and stable  Complications: No apparent anesthesia complications

## 2017-11-06 NOTE — Anesthesia Preprocedure Evaluation (Signed)
Anesthesia Evaluation  Patient identified by MRN, date of birth, ID band Patient awake    Reviewed: Allergy & Precautions, NPO status , Patient's Chart, lab work & pertinent test results  Airway Mallampati: II  TM Distance: >3 FB Neck ROM: Full    Dental   Pulmonary sleep apnea and Continuous Positive Airway Pressure Ventilation , former smoker,    breath sounds clear to auscultation       Cardiovascular hypertension, Pt. on medications + CAD, + CABG (2004) and + Peripheral Vascular Disease   Rhythm:Regular Rate:Normal     Neuro/Psych Anxiety Depression negative neurological ROS     GI/Hepatic GERD  ,  Endo/Other  diabetes, Type 2  Renal/GU      Musculoskeletal  (+) Arthritis ,   Abdominal   Peds  Hematology negative hematology ROS (+)   Anesthesia Other Findings   Reproductive/Obstetrics                             Lab Results  Component Value Date   WBC 10.8 (H) 09/21/2017   HGB 11.9 (L) 09/21/2017   HCT 35.1 (L) 09/21/2017   MCV 93.6 09/21/2017   PLT 198 09/21/2017   Lab Results  Component Value Date   CREATININE 1.04 09/20/2017   BUN 15 09/20/2017   NA 138 09/20/2017   K 4.6 09/20/2017   CL 105 09/20/2017   CO2 23 09/20/2017    Anesthesia Physical  Anesthesia Plan  ASA: III  Anesthesia Plan: General   Post-op Pain Management:    Induction: Intravenous  PONV Risk Score and Plan: 2 and Dexamethasone, Ondansetron and Treatment may vary due to age or medical condition  Airway Management Planned: Oral ETT  Additional Equipment:   Intra-op Plan:   Post-operative Plan:   Informed Consent: I have reviewed the patients History and Physical, chart, labs and discussed the procedure including the risks, benefits and alternatives for the proposed anesthesia with the patient or authorized representative who has indicated his/her understanding and acceptance.   Dental  advisory given  Plan Discussed with: CRNA  Anesthesia Plan Comments:         Anesthesia Quick Evaluation

## 2017-11-07 ENCOUNTER — Encounter: Payer: Self-pay | Admitting: Otolaryngology

## 2017-11-13 ENCOUNTER — Ambulatory Visit: Payer: Medicare Other | Admitting: Urology

## 2017-11-13 DIAGNOSIS — Z79899 Other long term (current) drug therapy: Secondary | ICD-10-CM | POA: Diagnosis not present

## 2017-11-13 DIAGNOSIS — Z96642 Presence of left artificial hip joint: Secondary | ICD-10-CM | POA: Diagnosis not present

## 2017-11-13 DIAGNOSIS — E1151 Type 2 diabetes mellitus with diabetic peripheral angiopathy without gangrene: Secondary | ICD-10-CM | POA: Diagnosis not present

## 2017-11-13 DIAGNOSIS — E7800 Pure hypercholesterolemia, unspecified: Secondary | ICD-10-CM | POA: Diagnosis not present

## 2017-11-13 DIAGNOSIS — M25552 Pain in left hip: Secondary | ICD-10-CM | POA: Diagnosis not present

## 2017-11-16 DIAGNOSIS — Z96642 Presence of left artificial hip joint: Secondary | ICD-10-CM | POA: Diagnosis not present

## 2017-11-16 DIAGNOSIS — M25552 Pain in left hip: Secondary | ICD-10-CM | POA: Diagnosis not present

## 2017-11-20 DIAGNOSIS — Z79899 Other long term (current) drug therapy: Secondary | ICD-10-CM | POA: Diagnosis not present

## 2017-11-20 DIAGNOSIS — K219 Gastro-esophageal reflux disease without esophagitis: Secondary | ICD-10-CM | POA: Diagnosis not present

## 2017-11-20 DIAGNOSIS — F3341 Major depressive disorder, recurrent, in partial remission: Secondary | ICD-10-CM | POA: Diagnosis not present

## 2017-11-20 DIAGNOSIS — Z125 Encounter for screening for malignant neoplasm of prostate: Secondary | ICD-10-CM | POA: Diagnosis not present

## 2017-11-20 DIAGNOSIS — E119 Type 2 diabetes mellitus without complications: Secondary | ICD-10-CM | POA: Diagnosis not present

## 2017-11-20 DIAGNOSIS — M25552 Pain in left hip: Secondary | ICD-10-CM | POA: Diagnosis not present

## 2017-11-20 DIAGNOSIS — E7800 Pure hypercholesterolemia, unspecified: Secondary | ICD-10-CM | POA: Diagnosis not present

## 2017-11-20 DIAGNOSIS — Z96642 Presence of left artificial hip joint: Secondary | ICD-10-CM | POA: Diagnosis not present

## 2017-11-20 DIAGNOSIS — I10 Essential (primary) hypertension: Secondary | ICD-10-CM | POA: Diagnosis not present

## 2017-11-20 DIAGNOSIS — F5101 Primary insomnia: Secondary | ICD-10-CM | POA: Diagnosis not present

## 2017-11-26 DIAGNOSIS — Z96642 Presence of left artificial hip joint: Secondary | ICD-10-CM | POA: Diagnosis not present

## 2017-11-28 DIAGNOSIS — E119 Type 2 diabetes mellitus without complications: Secondary | ICD-10-CM | POA: Diagnosis not present

## 2018-05-14 ENCOUNTER — Encounter: Payer: Self-pay | Admitting: Podiatry

## 2018-05-14 ENCOUNTER — Ambulatory Visit (INDEPENDENT_AMBULATORY_CARE_PROVIDER_SITE_OTHER): Payer: Medicare Other | Admitting: Podiatry

## 2018-05-14 DIAGNOSIS — L989 Disorder of the skin and subcutaneous tissue, unspecified: Secondary | ICD-10-CM | POA: Diagnosis not present

## 2018-05-14 DIAGNOSIS — E1121 Type 2 diabetes mellitus with diabetic nephropathy: Secondary | ICD-10-CM

## 2018-05-15 ENCOUNTER — Ambulatory Visit: Payer: Medicare Other | Admitting: Orthotics

## 2018-05-15 DIAGNOSIS — L989 Disorder of the skin and subcutaneous tissue, unspecified: Secondary | ICD-10-CM

## 2018-05-15 DIAGNOSIS — L089 Local infection of the skin and subcutaneous tissue, unspecified: Secondary | ICD-10-CM

## 2018-05-15 DIAGNOSIS — E1121 Type 2 diabetes mellitus with diabetic nephropathy: Secondary | ICD-10-CM

## 2018-05-15 DIAGNOSIS — S81801A Unspecified open wound, right lower leg, initial encounter: Secondary | ICD-10-CM

## 2018-05-15 NOTE — Progress Notes (Signed)

## 2018-05-26 NOTE — Progress Notes (Signed)
   SUBJECTIVE Patient with a history of diabetes mellitus presents to office today complaining of elongated, thickened nails that cause pain while ambulating in shoes.  He is unable to trim his own nails due to the thickness.  He reports pain with certain shoe gear.  Past Medical History:  Diagnosis Date  . Arthritis    rheumatoid   . Coronary artery disease   . Depression   . Diabetes mellitus without complication ([place])   . Diverticulosis   . DVT (deep venous thrombosis) ([place])    RIGHT LEG  WHILE IN MILITARY   . Dysrhythmia    SA w/ 1st degree HB  . GERD (gastroesophageal reflux disease)   . Hypertension   . Leg lesion    RIGHT WOUND CARE  . Localized osteoarthritis of right knee 05/21/2012  . Primary localized osteoarthritis of left hip 09/18/2017  . Reflux   . Sleep apnea    CPAP  . Sleeping difficulty    takes med nightly  . Urgency of urination     OBJECTIVE General Patient is awake, alert, and oriented x 3 and in no acute distress. Derm Skin is dry and supple bilateral. Negative open lesions or macerations. Remaining integument unremarkable. Nails are tender, long, thickened and dystrophic with subungual debris, consistent with onychomycosis, 1-5 bilateral. No signs of infection noted.  Hyperkeratotic callus lesion noted to the left fourth digit.  No open wound noted Vasc  DP and PT pedal pulses palpable bilaterally. Temperature gradient within normal limits.  Neuro Epicritic and protective threshold sensation diminished bilaterally.  Musculoskeletal Exam No symptomatic pedal deformities noted bilateral. Muscular strength within normal limits.  ASSESSMENT 1. Diabetes Mellitus w/ peripheral neuropathy 2. Onychomycosis of nail due to dermatophyte bilateral 3.  Pre-ulcerative callus lesion fourth digit left foot  PLAN OF CARE 1. Patient evaluated today. 2. Instructed to maintain good pedal hygiene and foot care. Stressed importance of controlling blood sugar.  3.  Mechanical debridement of nails 1-5 bilaterally performed using a nail nipper. Filed with dremel without incident.  4.  Excisional debridement of the callus lesion was noted to the fourth digit left foot. 5.  Patient has an appointment with Liliane Channel, Pedorthist for diabetic shoes and insoles.  Appointment is tomorrow, 05/15/2018  6.  Return to clinic annually    Edrick Kins, DPM [facility]  Dr. Edrick Kins, [address]                                        [place], [place] [address]                Office [phone]  Fax [phone]

## 2018-07-03 ENCOUNTER — Other Ambulatory Visit: Payer: Self-pay

## 2018-07-03 ENCOUNTER — Ambulatory Visit (INDEPENDENT_AMBULATORY_CARE_PROVIDER_SITE_OTHER): Payer: Medicare Other | Admitting: Orthotics

## 2018-07-03 DIAGNOSIS — L989 Disorder of the skin and subcutaneous tissue, unspecified: Secondary | ICD-10-CM | POA: Diagnosis not present

## 2018-07-03 DIAGNOSIS — I10 Essential (primary) hypertension: Secondary | ICD-10-CM

## 2018-07-03 DIAGNOSIS — I890 Lymphedema, not elsewhere classified: Secondary | ICD-10-CM | POA: Diagnosis not present

## 2018-07-03 DIAGNOSIS — E1121 Type 2 diabetes mellitus with diabetic nephropathy: Secondary | ICD-10-CM | POA: Diagnosis not present

## 2018-07-03 DIAGNOSIS — L089 Local infection of the skin and subcutaneous tissue, unspecified: Secondary | ICD-10-CM

## 2018-07-03 DIAGNOSIS — S81801A Unspecified open wound, right lower leg, initial encounter: Secondary | ICD-10-CM

## 2018-07-03 NOTE — Progress Notes (Signed)

## 2019-05-01 ENCOUNTER — Ambulatory Visit (INDEPENDENT_AMBULATORY_CARE_PROVIDER_SITE_OTHER): Payer: Medicare Other | Admitting: Podiatry

## 2019-05-01 ENCOUNTER — Other Ambulatory Visit: Payer: Self-pay

## 2019-05-01 ENCOUNTER — Encounter: Payer: Self-pay | Admitting: Podiatry

## 2019-05-01 DIAGNOSIS — L989 Disorder of the skin and subcutaneous tissue, unspecified: Secondary | ICD-10-CM | POA: Diagnosis not present

## 2019-05-01 DIAGNOSIS — M2042 Other hammer toe(s) (acquired), left foot: Secondary | ICD-10-CM

## 2019-05-01 DIAGNOSIS — E1142 Type 2 diabetes mellitus with diabetic polyneuropathy: Secondary | ICD-10-CM

## 2019-05-01 DIAGNOSIS — E1140 Type 2 diabetes mellitus with diabetic neuropathy, unspecified: Secondary | ICD-10-CM | POA: Insufficient documentation

## 2019-05-01 NOTE — Progress Notes (Signed)
This patient presents the office with chief complaint of a painful fifth toe left foot.  Patient states that he has developed a painful callus on the inside of the fifth toe of the left foot which is painful walking wearing his shoes.  He says he has attempted to self treat but the problem persists.  Patient is diabetic.  He also says he is interested in acquiring diabetic shoes.  General Appearance  Alert, conversant and in no acute stress.  Vascular  Dorsalis pedis and posterior tibial  pulses are palpable  bilaterally.  Capillary return is within normal limits  bilaterally. Temperature is within normal limits  bilaterally.  Neurologic  Senn-Weinstein monofilament wire test diminished   bilaterally. Muscle power within normal limits bilaterally.  Nails Normotropic nails both feet with no evidence of infection or drainage.  Orthopedic  No limitations of motion  feet .  No crepitus or effusions noted.  Severe adducto-varus fifth toes  B/L  Skin  normotropic skin with no porokeratosis noted bilaterally.  No signs of infections or ulcers noted.  Corn noted on the medial aspect DIPJ fifth toe left foot.  Corn secondary adv fifth digits  B/L.  ROV.  Patient has developed a corn secondary to a cold fifth digit left foot.  Debridement of the corn was performed with a 15 blade.  Discussed conservative versus surgical treatment of the fifth toe left foot.  Patient is to make an appointment with Liliane Channel for diabetic shoes due to DPN and hammertoes bilaterally.  Patient is to make an appointment with Dr. Posey Pronto for rotational arthroplasty fifth digit left foot.  RTC prn.  Padding dispensed.   Gardiner Barefoot DPM

## 2019-05-19 ENCOUNTER — Ambulatory Visit (INDEPENDENT_AMBULATORY_CARE_PROVIDER_SITE_OTHER): Payer: Medicare Other | Admitting: Podiatry

## 2019-05-19 ENCOUNTER — Ambulatory Visit (INDEPENDENT_AMBULATORY_CARE_PROVIDER_SITE_OTHER): Payer: Medicare Other

## 2019-05-19 ENCOUNTER — Other Ambulatory Visit: Payer: Self-pay

## 2019-05-19 DIAGNOSIS — M2042 Other hammer toe(s) (acquired), left foot: Secondary | ICD-10-CM

## 2019-05-19 DIAGNOSIS — E1121 Type 2 diabetes mellitus with diabetic nephropathy: Secondary | ICD-10-CM

## 2019-05-19 DIAGNOSIS — M205X2 Other deformities of toe(s) (acquired), left foot: Secondary | ICD-10-CM

## 2019-05-19 NOTE — Patient Instructions (Signed)
Pre-Operative Instructions  Congratulations, you have decided to take an important step towards improving your quality of life.  You can be assured that the doctors and staff at [facility] will be with you every step of the way.  Here are some important things you should know:  1. Plan to be at the surgery center/hospital at least 1 (one) hour prior to your scheduled time, unless otherwise directed by the surgical center/hospital staff.  You must have a responsible adult accompany you, remain during the surgery and drive you home.  Make sure you have directions to the surgical center/hospital to ensure you arrive on time. 2. If you are having surgery at [facility] or [facility] hospitals, you will need a copy of your medical history and physical form from your family physician within one month prior to the date of surgery. We will give you a form for your primary physician to complete.  3. We make every effort to accommodate the date you request for surgery.  However, there are times where surgery dates or times have to be moved.  We will contact you as soon as possible if a change in schedule is required.   4. No aspirin/ibuprofen for one week before surgery.  If you are on aspirin, any non-steroidal anti-inflammatory medications (Mobic, Aleve, Ibuprofen) should not be taken seven (7) days prior to your surgery.  You make take Tylenol for pain prior to surgery.  5. Medications - If you are taking daily heart and blood pressure medications, seizure, reflux, allergy, asthma, anxiety, pain or diabetes medications, make sure you notify the surgery center/hospital before the day of surgery so they can tell you which medications you should take or avoid the day of surgery. 6. No food or drink after midnight the night before surgery unless directed otherwise by surgical center/hospital staff. 7. No alcoholic beverages 24-hours prior to surgery.  No smoking 24-hours prior or 24-hours after  surgery. 8. Wear loose pants or shorts. They should be loose enough to fit over bandages, boots, and casts. 9. Don't wear slip-on shoes. Sneakers are preferred. 10. Bring your boot with you to the surgery center/hospital.  Also bring crutches or a walker if your physician has prescribed it for you.  If you do not have this equipment, it will be provided for you after surgery. 11. If you have not been contacted by the surgery center/hospital by the day before your surgery, call to confirm the date and time of your surgery. 12. Leave-time from work may vary depending on the type of surgery you have.  Appropriate arrangements should be made prior to surgery with your employer. 13. Prescriptions will be provided immediately following surgery by your doctor.  Fill these as soon as possible after surgery and take the medication as directed. Pain medications will not be refilled on weekends and must be approved by the doctor. 14. Remove nail polish on the operative foot and avoid getting pedicures prior to surgery. 15. Wash the night before surgery.  The night before surgery wash the foot and leg well with water and the antibacterial soap provided. Be sure to pay special attention to beneath the toenails and in between the toes.  Wash for at least three (3) minutes. Rinse thoroughly with water and dry well with a towel.  Perform this wash unless told not to do so by your physician.  Enclosed: 1 Ice pack (please put in freezer the night before surgery)   1 Hibiclens skin cleaner     Pre-op instructions  If you have any questions regarding the instructions, please do not hesitate to call our office.  [place]: [address], [place], [place] [address] -- [phone]  [facility]: [address] [place], [place] [address] -- [phone]  [place]: [address], [place], [place] [address] -- [phone]   Website: [email or web address] 

## 2019-05-20 ENCOUNTER — Encounter: Payer: Self-pay | Admitting: Podiatry

## 2019-05-20 NOTE — Progress Notes (Signed)
Subjective:  Patient ID: Seth Mulberry Sr., male    DOB: Jun 24, 1940,  MRN: [id number]  Chief Complaint  Patient presents with  . Foot Pain    pt is here for a surgical consult of the left foot 5th toe digit, pt states that it has been going on for over a year now, pain is elevated when walking    79 y.o. male presents with the above complaint.  Patient presents with left fifth digit hammertoe deformity.  Patient states is constantly hurts only rub against her shoes.  He states is been going on for over a year.  He has tried creams on pads toe protectors but has not helped at all.  He is well-known to Dr. Prudence Davidson who had been treating him for debridement of the hyperkeratotic lesion.  Patient states that the right foot fifth toe pain is elevated when ambulating.  He has tried with shoe gear modification which has not helped.  He has failed all conservative therapy and would like to undergo surgical intervention to help correct the left fifth digit hammertoe.  He denies any other acute complaints.   Review of Systems: Negative except as noted in the HPI. Denies N/V/F/Ch.  Past Medical History:  Diagnosis Date  . Arthritis    rheumatoid   . Coronary artery disease   . Depression   . Diabetes mellitus without complication ([place])   . Diverticulosis   . DVT (deep venous thrombosis) ([place])    RIGHT LEG  WHILE IN MILITARY   . Dysrhythmia    SA w/ 1st degree HB  . GERD (gastroesophageal reflux disease)   . Hypertension   . Leg lesion    RIGHT WOUND CARE  . Localized osteoarthritis of right knee 05/21/2012  . Primary localized osteoarthritis of left hip 09/18/2017  . Reflux   . Sleep apnea    CPAP  . Sleeping difficulty    takes med nightly  . Urgency of urination     Current Outpatient Medications:  .  Ascorbic Acid (VITAMIN C) 1000 MG tablet, Take 1,000 mg by mouth daily. , Disp: , Rfl:  .  azelastine (ASTELIN) 0.1 % nasal spray, Place 1 spray into both nostrils 2 (two) times daily.  Use in each nostril as directed , Disp: , Rfl:  .  buPROPion (WELLBUTRIN XL) 300 MG 24 hr tablet, TAKE 1 TABLET DAILY, Disp: 90 tablet, Rfl: 3 .  busPIRone (BUSPAR) 15 MG tablet, Take 1 tablet (15 mg total) by mouth 2 (two) times daily., Disp: 180 tablet, Rfl: 0 .  Calcium-Magnesium (CAL-MAG PO), Take 1 tablet by mouth daily., Disp: , Rfl:  .  cephALEXin (KEFLEX) 500 MG capsule, Take 1 capsule (500 mg total) by mouth 2 (two) times daily., Disp: 20 capsule, Rfl: 0 .  Cholecalciferol (VITAMIN D3) 5000 units CAPS, Take 5,000 Units by mouth daily. , Disp: , Rfl:  .  clonazePAM (KLONOPIN) 0.5 MG tablet, Take 1 tablet (0.5 mg total) by mouth 2 (two) times daily as needed. anxiety (Patient taking differently: Take 0.5 mg by mouth at bedtime. ), Disp: 60 tablet, Rfl: 0 .  Coenzyme Q10 (CO Q 10) 100 MG CAPS, Take 100 mg by mouth daily. , Disp: , Rfl:  .  CRANBERRY PO, Take by mouth., Disp: , Rfl:  .  doxylamine, Sleep, (UNISOM) 25 MG tablet, Take 25 mg by mouth at bedtime., Disp: , Rfl:  .  empagliflozin (JARDIANCE) 25 MG TABS tablet, TAKE 1 TABLET DAILY ( REPLACES FARXIGA ),  Disp: , Rfl:  .  ezetimibe-simvastatin (VYTORIN) 10-20 MG tablet, TAKE 1 TABLET DAILY AT 6 P.M, Disp: 90 tablet, Rfl: 3 .  fluticasone (FLONASE) 50 MCG/ACT nasal spray, Place 2 sprays into both nostrils daily. (Patient taking differently: Place 2 sprays into both nostrils 2 (two) times daily. ), Disp: 16 g, Rfl: 1 .  HYDROcodone-acetaminophen (NORCO/VICODIN) 5-325 MG tablet, Take 1-2 tablets by mouth every 6 (six) hours as needed for moderate pain., Disp: 40 tablet, Rfl: 0 .  irbesartan (AVAPRO) 150 MG tablet, TAKE 1 TABLET DAILY, Disp: 90 tablet, Rfl: 0 .  metFORMIN (GLUCOPHAGE) 1000 MG tablet, Take 1 tablet (1,000 mg total) by mouth 2 (two) times daily with a meal., Disp: 180 tablet, Rfl: 0 .  Multiple Vitamin (MULTIVITAMIN WITH MINERALS) TABS, Take 1 tablet by mouth daily., Disp: , Rfl:  .  pantoprazole (PROTONIX) 40 MG tablet, Take  1 tablet (40 mg total) by mouth daily., Disp: 90 tablet, Rfl: 0 .  RESVERATROL PO, Take by mouth., Disp: , Rfl:  .  sennosides-docusate sodium (SENOKOT-S) 8.6-50 MG tablet, Take 2 tablets by mouth daily., Disp: 30 tablet, Rfl: 1 .  sildenafil (VIAGRA) 50 MG tablet, Take 1 tablet (50 mg total) by mouth daily as needed for erectile dysfunction. 1 tablet po 30 mins prior to sexual activity, Disp: 10 tablet, Rfl: 0 .  solifenacin (VESICARE) 10 MG tablet, Take 1 tablet (10 mg total) by mouth daily., Disp: 90 tablet, Rfl: 3 .  tamsulosin (FLOMAX) 0.4 MG CAPS capsule, Take 0.4 mg by mouth daily. , Disp: , Rfl:  .  TURMERIC PO, Take 1,000 mg by mouth daily at 12 noon., Disp: , Rfl:  .  Vitamins/Minerals TABS, Take by mouth., Disp: , Rfl:   Social History   Tobacco Use  Smoking Status Former Smoker  . Quit date: 05/14/1992  . Years since quitting: 27.0  Smokeless Tobacco Never Used    No Known Allergies Objective:  There were no vitals filed for this visit. There is no height or weight on file to calculate BMI. Constitutional Well developed. Well nourished.  Vascular Dorsalis pedis pulses palpable bilaterally. Posterior tibial pulses palpable bilaterally. Capillary refill normal to all digits.  No cyanosis or clubbing noted. Pedal hair growth normal.  Neurologic Normal speech. Oriented to person, place, and time. Epicritic sensation to light touch grossly present bilaterally.  Dermatologic Nails well groomed and normal in appearance. No open wounds. No skin lesions.  Orthopedic:  Semiflexible left fifth digit hammertoe contracture noted.  There is an adductovarus rotation of the digit noted.  Pain on palpation to the hyperkeratotic lesion on the lateral aspect of the fifth digit.  No tailor's bunion noted.   Radiographs: 3 views of skeletally mature adult foot: Hammertoe contracture of the fifth digit left foot noted.  No bullet hole sign noted.Mild increase in soft tissue edema noted  around the fifth digit.  Arthritis in the first metatarsophalangeal joint noted midfoot arthritis noted. Assessment:   1. Hammer toe of left foot   2. Controlled type 2 diabetes mellitus with diabetic nephropathy, unspecified whether long term insulin use ([place])   3. Toe contracture, left   4. Adductovarus rotation of toe, acquired, left    Plan:  Patient was evaluated and treated and all questions answered.  Left fifth digit hammertoe contracture with adductovarus rotation -I explained to the patient the etiology of hammertoe contracture and various treatment options associated with it with the hammertoe deformity.  Given that patient has failed  all conservative therapy including padding offloading toe protector, I believe patient will benefit from surgical intervention to correct the hammertoe contracture with a possible pin fixation.  Given that patient is diabetic with controlled sugars with last A1c of 7.2, I explained to the patient that he is at a high risk for complications including infection and skin dehiscence/incision dehiscence.  Patient states understanding and would like to proceed with the surgical intervention. -I plan on performing left fifth digit derotational arthroplasty with a possible K wire fixation -Informed surgical risk consent was reviewed and read aloud to the patient.  I reviewed the films.  I have discussed my findings with the patient in great detail.  I have discussed all risks including but not limited to infection, stiffness, scarring, limp, disability, deformity, damage to blood vessels and nerves, numbness, poor healing, need for braces, arthritis, chronic pain, amputation, death.  All benefits and realistic expectations discussed in great detail.  I have made no promises as to the outcome.  I have provided realistic expectations.  I have offered the patient a 2nd opinion, which they have declined and assured me they preferred to proceed despite the risks -Surgical  shoe was dispensed.  Patient will be weightbearing as tolerated in surgical shoe.  No follow-ups on file.

## 2019-05-28 ENCOUNTER — Other Ambulatory Visit: Payer: Self-pay

## 2019-05-28 ENCOUNTER — Ambulatory Visit: Payer: Medicare Other | Admitting: Orthotics

## 2019-05-28 DIAGNOSIS — E1142 Type 2 diabetes mellitus with diabetic polyneuropathy: Secondary | ICD-10-CM

## 2019-05-28 DIAGNOSIS — M205X2 Other deformities of toe(s) (acquired), left foot: Secondary | ICD-10-CM

## 2019-05-28 DIAGNOSIS — M2042 Other hammer toe(s) (acquired), left foot: Secondary | ICD-10-CM

## 2019-05-28 DIAGNOSIS — E1121 Type 2 diabetes mellitus with diabetic nephropathy: Secondary | ICD-10-CM

## 2019-05-28 NOTE — Progress Notes (Signed)

## 2019-05-29 ENCOUNTER — Telehealth: Payer: Self-pay | Admitting: Podiatry

## 2019-05-29 NOTE — Telephone Encounter (Signed)
I'm scheduled for sx on Monday, 02/08. My computer is down and I had to send it in for repairs and I haven't received a time yet to come in. Can someone please call me back at [phone]. This is very important.

## 2019-05-29 NOTE — Telephone Encounter (Signed)
Told pt I was calling him back in regards to his message. Stated someone would be calling him either today or tomorrow to let him know what time to arrive for his sx on Monday. Pt stated they had already called him and he is to be there at 7:15 Monday morning. I wished the pt luck with his sx.

## 2019-06-02 DIAGNOSIS — M2042 Other hammer toe(s) (acquired), left foot: Secondary | ICD-10-CM | POA: Diagnosis not present

## 2019-06-02 MED ORDER — IBUPROFEN 800 MG PO TABS: 800.0000 mg | ORAL_TABLET | Freq: Four times a day (QID) | ORAL | 1 refills | Status: AC | PRN

## 2019-06-02 MED ORDER — OXYCODONE-ACETAMINOPHEN 10-325 MG PO TABS: 1.0000 | ORAL_TABLET | Freq: Three times a day (TID) | ORAL | 0 refills | Status: AC | PRN

## 2019-06-02 NOTE — Addendum Note (Signed)
Addended by: Boneta Lucks on: 06/02/2019 08:55 AM   Modules accepted: Orders

## 2019-06-09 ENCOUNTER — Encounter: Payer: Self-pay | Admitting: Podiatry

## 2019-06-09 ENCOUNTER — Ambulatory Visit (INDEPENDENT_AMBULATORY_CARE_PROVIDER_SITE_OTHER): Payer: Medicare Other | Admitting: Podiatry

## 2019-06-09 ENCOUNTER — Other Ambulatory Visit: Payer: Self-pay | Admitting: Podiatry

## 2019-06-09 ENCOUNTER — Ambulatory Visit (INDEPENDENT_AMBULATORY_CARE_PROVIDER_SITE_OTHER): Payer: Medicare Other

## 2019-06-09 ENCOUNTER — Other Ambulatory Visit: Payer: Self-pay

## 2019-06-09 DIAGNOSIS — E1121 Type 2 diabetes mellitus with diabetic nephropathy: Secondary | ICD-10-CM

## 2019-06-09 DIAGNOSIS — M2042 Other hammer toe(s) (acquired), left foot: Secondary | ICD-10-CM

## 2019-06-09 DIAGNOSIS — M2041 Other hammer toe(s) (acquired), right foot: Secondary | ICD-10-CM

## 2019-06-09 DIAGNOSIS — Z9889 Other specified postprocedural states: Secondary | ICD-10-CM

## 2019-06-09 NOTE — Progress Notes (Signed)
Subjective:  Patient ID: Seth Mulberry Sr., male    DOB: 02/10/1941,  MRN: [id number]  Chief Complaint  Patient presents with  . Routine Post Op     pov#1 dos 02.08.2021 Hammertoe Repair 80th Lt (Pt req [place])    79 y.o. male returns for post-op check.  Patient states is doing well.  His incision is healing really well.  He denies any clinical signs of infection.  He has been weightbearing as tolerated with a cam boot on.  He would like to know when the stitches will come out because he has a plan to return back to [place].  Review of Systems: Negative except as noted in the HPI. Denies N/V/F/Ch.  Past Medical History:  Diagnosis Date  . Arthritis    rheumatoid   . Coronary artery disease   . Depression   . Diabetes mellitus without complication ([place])   . Diverticulosis   . DVT (deep venous thrombosis) ([place])    RIGHT LEG  WHILE IN MILITARY   . Dysrhythmia    SA w/ 1st degree HB  . GERD (gastroesophageal reflux disease)   . Hypertension   . Leg lesion    RIGHT WOUND CARE  . Localized osteoarthritis of right knee 05/21/2012  . Primary localized osteoarthritis of left hip 09/18/2017  . Reflux   . Sleep apnea    CPAP  . Sleeping difficulty    takes med nightly  . Urgency of urination     Current Outpatient Medications:  .  Ascorbic Acid (VITAMIN C) 1000 MG tablet, Take 1,000 mg by mouth daily. , Disp: , Rfl:  .  azelastine (ASTELIN) 0.1 % nasal spray, Place 1 spray into both nostrils 2 (two) times daily. Use in each nostril as directed , Disp: , Rfl:  .  buPROPion (WELLBUTRIN XL) 300 MG 24 hr tablet, TAKE 1 TABLET DAILY, Disp: 90 tablet, Rfl: 3 .  busPIRone (BUSPAR) 15 MG tablet, Take 1 tablet (15 mg total) by mouth 2 (two) times daily., Disp: 180 tablet, Rfl: 0 .  Calcium-Magnesium (CAL-MAG PO), Take 1 tablet by mouth daily., Disp: , Rfl:  .  cephALEXin (KEFLEX) 500 MG capsule, Take 1 capsule (500 mg total) by mouth 2 (two) times daily., Disp: 20 capsule, Rfl: 0 .   Cholecalciferol (VITAMIN D3) 5000 units CAPS, Take 5,000 Units by mouth daily. , Disp: , Rfl:  .  clonazePAM (KLONOPIN) 0.5 MG tablet, Take 1 tablet (0.5 mg total) by mouth 2 (two) times daily as needed. anxiety (Patient taking differently: Take 0.5 mg by mouth at bedtime. ), Disp: 60 tablet, Rfl: 0 .  Coenzyme Q10 (CO Q 10) 100 MG CAPS, Take 100 mg by mouth daily. , Disp: , Rfl:  .  CRANBERRY PO, Take by mouth., Disp: , Rfl:  .  doxylamine, Sleep, (UNISOM) 25 MG tablet, Take 25 mg by mouth at bedtime., Disp: , Rfl:  .  empagliflozin (JARDIANCE) 25 MG TABS tablet, TAKE 1 TABLET DAILY ( REPLACES FARXIGA ), Disp: , Rfl:  .  ezetimibe-simvastatin (VYTORIN) 10-20 MG tablet, TAKE 1 TABLET DAILY AT 6 P.M, Disp: 90 tablet, Rfl: 3 .  fluticasone (FLONASE) 50 MCG/ACT nasal spray, Place 2 sprays into both nostrils daily. (Patient taking differently: Place 2 sprays into both nostrils 2 (two) times daily. ), Disp: 16 g, Rfl: 1 .  HYDROcodone-acetaminophen (NORCO/VICODIN) 5-325 MG tablet, Take 1-2 tablets by mouth every 6 (six) hours as needed for moderate pain., Disp: 40 tablet, Rfl: 0 .  ibuprofen (ADVIL) 800 MG tablet, Take 1 tablet (800 mg total) by mouth every 6 (six) hours as needed., Disp: 60 tablet, Rfl: 1 .  irbesartan (AVAPRO) 150 MG tablet, TAKE 1 TABLET DAILY, Disp: 90 tablet, Rfl: 0 .  metFORMIN (GLUCOPHAGE) 1000 MG tablet, Take 1 tablet (1,000 mg total) by mouth 2 (two) times daily with a meal., Disp: 180 tablet, Rfl: 0 .  metFORMIN (GLUCOPHAGE-XR) 500 MG 24 hr tablet, Take 500 mg by mouth 2 (two) times daily., Disp: , Rfl:  .  Multiple Vitamin (MULTIVITAMIN WITH MINERALS) TABS, Take 1 tablet by mouth daily., Disp: , Rfl:  .  pantoprazole (PROTONIX) 40 MG tablet, Take 1 tablet (40 mg total) by mouth daily., Disp: 90 tablet, Rfl: 0 .  RESVERATROL PO, Take by mouth., Disp: , Rfl:  .  sennosides-docusate sodium (SENOKOT-S) 8.6-50 MG tablet, Take 2 tablets by mouth daily., Disp: 30 tablet, Rfl: 1 .   sildenafil (VIAGRA) 50 MG tablet, Take 1 tablet (50 mg total) by mouth daily as needed for erectile dysfunction. 1 tablet po 30 mins prior to sexual activity, Disp: 10 tablet, Rfl: 0 .  solifenacin (VESICARE) 10 MG tablet, Take 1 tablet (10 mg total) by mouth daily., Disp: 90 tablet, Rfl: 3 .  tamsulosin (FLOMAX) 0.4 MG CAPS capsule, Take 0.4 mg by mouth daily. , Disp: , Rfl:  .  TURMERIC PO, Take 1,000 mg by mouth daily at 12 noon., Disp: , Rfl:  .  Vitamins/Minerals TABS, Take by mouth., Disp: , Rfl:   Social History   Tobacco Use  Smoking Status Former Smoker  . Quit date: 05/14/1992  . Years since quitting: 27.0  Smokeless Tobacco Never Used    No Known Allergies Objective:  There were no vitals filed for this visit. There is no height or weight on file to calculate BMI. Constitutional Well developed. Well nourished.  Vascular Foot warm and well perfused. Capillary refill normal to all digits.   Neurologic Normal speech. Oriented to person, place, and time. Epicritic sensation to light touch grossly present bilaterally.  Dermatologic Skin healing well without signs of infection. Skin edges well coapted without signs of infection.  Orthopedic: Tenderness to palpation noted about the surgical site.   Radiographs: 3 views of skeletally mature adult foot: Good correction and alignment noted of the fifth digit.  Excision of the middle phalanx noted.  No other bony abnormalities identified. Assessment:   1. Hammer toe of left foot    Plan:  Patient was evaluated and treated and all questions answered.  S/p foot surgery left -Progressing as expected post-operatively. -XR: See above -WB Status: Weightbearing as tolerated in surgical shoe -Sutures: Intact without any clinical signs of dehiscence. -Medications: None -Foot redressed.  No follow-ups on file.

## 2019-06-10 ENCOUNTER — Encounter: Payer: Medicare Other | Admitting: Podiatry

## 2019-06-17 ENCOUNTER — Encounter: Payer: Medicare Other | Admitting: Podiatry

## 2019-06-18 ENCOUNTER — Encounter: Payer: Medicare Other | Admitting: Podiatry

## 2019-06-18 ENCOUNTER — Ambulatory Visit (INDEPENDENT_AMBULATORY_CARE_PROVIDER_SITE_OTHER): Payer: Medicare Other | Admitting: Podiatry

## 2019-06-18 ENCOUNTER — Other Ambulatory Visit: Payer: Self-pay

## 2019-06-18 ENCOUNTER — Encounter: Payer: Self-pay | Admitting: Podiatry

## 2019-06-18 VITALS — Temp 97.2°F

## 2019-06-18 DIAGNOSIS — E1121 Type 2 diabetes mellitus with diabetic nephropathy: Secondary | ICD-10-CM

## 2019-06-18 DIAGNOSIS — Z9889 Other specified postprocedural states: Secondary | ICD-10-CM

## 2019-06-18 DIAGNOSIS — M2042 Other hammer toe(s) (acquired), left foot: Secondary | ICD-10-CM

## 2019-06-20 ENCOUNTER — Encounter: Payer: Self-pay | Admitting: Podiatry

## 2019-06-20 NOTE — Progress Notes (Signed)
Subjective:  Patient ID: Seth Mulberry Sr., male    DOB: [birth year],  MRN: [id number]  Chief Complaint  Patient presents with  . Routine Post Op    1 wk POV#2 dos 02.08.2021 Hammertoe Repair 5th Lt, pt shows no signs of infection. Pt is well bandaged, sutures were removed as well.    79 y.o. male returns for post-op check.  Patient states is doing well.  His incision is healing really well.  He denies any clinical signs of infection.  He has completely healed.  Begin transition to regular sneakers  Review of Systems: Negative except as noted in the HPI. Denies N/V/F/Ch.  Past Medical History:  Diagnosis Date  . Arthritis    rheumatoid   . Coronary artery disease   . Depression   . Diabetes mellitus without complication ([place])   . Diverticulosis   . DVT (deep venous thrombosis) ([place])    RIGHT LEG  WHILE IN MILITARY   . Dysrhythmia    SA w/ 1st degree HB  . GERD (gastroesophageal reflux disease)   . Hypertension   . Leg lesion    RIGHT WOUND CARE  . Localized osteoarthritis of right knee 05/21/2012  . Primary localized osteoarthritis of left hip 09/18/2017  . Reflux   . Sleep apnea    CPAP  . Sleeping difficulty    takes med nightly  . Urgency of urination     Current Outpatient Medications:  .  Ascorbic Acid (VITAMIN C) 1000 MG tablet, Take 1,000 mg by mouth daily. , Disp: , Rfl:  .  azelastine (ASTELIN) 0.1 % nasal spray, Place 1 spray into both nostrils 2 (two) times daily. Use in each nostril as directed , Disp: , Rfl:  .  buPROPion (WELLBUTRIN XL) 300 MG 24 hr tablet, TAKE 1 TABLET DAILY, Disp: 90 tablet, Rfl: 3 .  busPIRone (BUSPAR) 15 MG tablet, Take 1 tablet (15 mg total) by mouth 2 (two) times daily., Disp: 180 tablet, Rfl: 0 .  Calcium-Magnesium (CAL-MAG PO), Take 1 tablet by mouth daily., Disp: , Rfl:  .  cephALEXin (KEFLEX) 500 MG capsule, Take 1 capsule (500 mg total) by mouth 2 (two) times daily., Disp: 20 capsule, Rfl: 0 .  Cholecalciferol (VITAMIN D3) 5000  units CAPS, Take 5,000 Units by mouth daily. , Disp: , Rfl:  .  clonazePAM (KLONOPIN) 0.5 MG tablet, Take 1 tablet (0.5 mg total) by mouth 2 (two) times daily as needed. anxiety (Patient taking differently: Take 0.5 mg by mouth at bedtime. ), Disp: 60 tablet, Rfl: 0 .  Coenzyme Q10 (CO Q 10) 100 MG CAPS, Take 100 mg by mouth daily. , Disp: , Rfl:  .  CRANBERRY PO, Take by mouth., Disp: , Rfl:  .  doxylamine, Sleep, (UNISOM) 25 MG tablet, Take 25 mg by mouth at bedtime., Disp: , Rfl:  .  empagliflozin (JARDIANCE) 25 MG TABS tablet, TAKE 1 TABLET DAILY ( REPLACES FARXIGA ), Disp: , Rfl:  .  ezetimibe-simvastatin (VYTORIN) 10-20 MG tablet, TAKE 1 TABLET DAILY AT 6 P.M, Disp: 90 tablet, Rfl: 3 .  fluticasone (FLONASE) 50 MCG/ACT nasal spray, Place 2 sprays into both nostrils daily. (Patient taking differently: Place 2 sprays into both nostrils 2 (two) times daily. ), Disp: 16 g, Rfl: 1 .  HYDROcodone-acetaminophen (NORCO/VICODIN) 5-325 MG tablet, Take 1-2 tablets by mouth every 6 (six) hours as needed for moderate pain., Disp: 40 tablet, Rfl: 0 .  ibuprofen (ADVIL) 800 MG tablet, Take 1 tablet (800 mg  total) by mouth every 6 (six) hours as needed., Disp: 60 tablet, Rfl: 1 .  irbesartan (AVAPRO) 150 MG tablet, TAKE 1 TABLET DAILY, Disp: 90 tablet, Rfl: 0 .  metFORMIN (GLUCOPHAGE) 1000 MG tablet, Take 1 tablet (1,000 mg total) by mouth 2 (two) times daily with a meal., Disp: 180 tablet, Rfl: 0 .  metFORMIN (GLUCOPHAGE-XR) 500 MG 24 hr tablet, Take 500 mg by mouth 2 (two) times daily., Disp: , Rfl:  .  Multiple Vitamin (MULTIVITAMIN WITH MINERALS) TABS, Take 1 tablet by mouth daily., Disp: , Rfl:  .  pantoprazole (PROTONIX) 40 MG tablet, Take 1 tablet (40 mg total) by mouth daily., Disp: 90 tablet, Rfl: 0 .  RESVERATROL PO, Take by mouth., Disp: , Rfl:  .  sennosides-docusate sodium (SENOKOT-S) 8.6-50 MG tablet, Take 2 tablets by mouth daily., Disp: 30 tablet, Rfl: 1 .  sildenafil (VIAGRA) 50 MG tablet,  Take 1 tablet (50 mg total) by mouth daily as needed for erectile dysfunction. 1 tablet po 30 mins prior to sexual activity, Disp: 10 tablet, Rfl: 0 .  solifenacin (VESICARE) 10 MG tablet, Take 1 tablet (10 mg total) by mouth daily., Disp: 90 tablet, Rfl: 3 .  tamsulosin (FLOMAX) 0.4 MG CAPS capsule, Take 0.4 mg by mouth daily. , Disp: , Rfl:  .  TURMERIC PO, Take 1,000 mg by mouth daily at 12 noon., Disp: , Rfl:  .  Vitamins/Minerals TABS, Take by mouth., Disp: , Rfl:   Social History   Tobacco Use  Smoking Status Former Smoker  . Quit date: 05/14/1992  . Years since quitting: 27.1  Smokeless Tobacco Never Used    No Known Allergies Objective:   Vitals:   06/18/19 0924  Temp: (!) 97.2 F (36.2 C)   There is no height or weight on file to calculate BMI. Constitutional Well developed. Well nourished.  Vascular Foot warm and well perfused. Capillary refill normal to all digits.   Neurologic Normal speech. Oriented to person, place, and time. Epicritic sensation to light touch grossly present bilaterally.  Dermatologic  skin completely reepithelialized without any signs of dehiscence.  No clinical signs of infection noted.  Orthopedic:  No tenderness to palpation noted about the surgical site.   Radiographs: None Assessment:   No diagnosis found. Plan:  Patient was evaluated and treated and all questions answered.  S/p foot surgery left -Progressing as expected post-operatively. -XR: See above -WB Status: Addition to regular sneakers -Sutures: Shoes were removed.  No clinical signs of dehiscence noted.  Skin completely reepithelialized -Medications: None -Patient will be leaving for [place] after this visit and will not be returning.  Given that patient has clinically and radiographically healed I feel comfortable discharging this patient from my care.  I have asked the patient if there is any issues he can follow-up with a local podiatrist or come back and see me.  Patient  states understanding  No follow-ups on file.

## 2019-07-01 ENCOUNTER — Encounter: Payer: Medicare Other | Admitting: Podiatry

## 2019-07-17 ENCOUNTER — Telehealth: Payer: Self-pay | Admitting: Podiatry

## 2019-07-17 NOTE — Telephone Encounter (Signed)
Pt left message checking on status of shoes/inserts ordered for him..  I returned call and left message that we have not received the diabetic shoe paperwork from his doctor and that is what is holding them up.Marland KitchenMarland Kitchen

## 2019-07-23 ENCOUNTER — Telehealth: Payer: Self-pay | Admitting: Podiatry

## 2019-07-23 NOTE — Telephone Encounter (Signed)
Pt left message checking on status of orthotics he had ordered a while ago.  I returned call and it was diabetic shoes and inserts and we have not gotten paperwork back from pcp yet and that is what is holding up his shoes. He said he had left message at the pcp office as well.I told pt I would call when we get paperwork from pcp/.

## 2019-07-24 NOTE — Telephone Encounter (Signed)
Notified pt that we received paperwork for diabetic shoes from pcp.

## 2019-07-31 ENCOUNTER — Ambulatory Visit: Payer: Medicare Other | Admitting: Podiatry

## 2019-08-22 ENCOUNTER — Telehealth: Payer: Self-pay | Admitting: Podiatry

## 2019-08-22 NOTE — Telephone Encounter (Signed)
Returned pts call requarding getting his shoes to him since he has moved to [place]. I lvm that I am still trying to fiqure it out with the billing and fitting and would give him a call next week.

## 2019-08-27 ENCOUNTER — Telehealth: Payer: Self-pay | Admitting: Podiatry

## 2019-08-27 NOTE — Telephone Encounter (Signed)
Discussed plan with pt regarding diabetic shoes/inserts.  We will ship to [address], [place] [address] and do a virtual visit when they arrive and pt to send signed form back to [place].Marland KitchenMarland Kitchen

## 2019-09-01 DIAGNOSIS — M2042 Other hammer toe(s) (acquired), left foot: Secondary | ICD-10-CM | POA: Diagnosis not present

## 2019-09-01 DIAGNOSIS — M2041 Other hammer toe(s) (acquired), right foot: Secondary | ICD-10-CM | POA: Diagnosis not present

## 2019-09-01 DIAGNOSIS — E1142 Type 2 diabetes mellitus with diabetic polyneuropathy: Secondary | ICD-10-CM | POA: Diagnosis not present
